# Patient Record
Sex: Male | Born: 1950 | Race: White | Hispanic: No | Marital: Married | Smoking: Never smoker
Health system: Southern US, Community
[De-identification: ages and names within clinical notes are randomized; demographics above are authoritative.]

## PROBLEM LIST (undated history)

## (undated) DIAGNOSIS — I2510 Atherosclerotic heart disease of native coronary artery without angina pectoris: Secondary | ICD-10-CM

## (undated) DIAGNOSIS — A048 Other specified bacterial intestinal infections: Secondary | ICD-10-CM

## (undated) DIAGNOSIS — F329 Major depressive disorder, single episode, unspecified: Secondary | ICD-10-CM

## (undated) DIAGNOSIS — I5032 Chronic diastolic (congestive) heart failure: Secondary | ICD-10-CM

## (undated) DIAGNOSIS — E785 Hyperlipidemia, unspecified: Secondary | ICD-10-CM

## (undated) DIAGNOSIS — J189 Pneumonia, unspecified organism: Secondary | ICD-10-CM

## (undated) DIAGNOSIS — F32A Depression, unspecified: Secondary | ICD-10-CM

## (undated) DIAGNOSIS — Q893 Situs inversus: Secondary | ICD-10-CM

## (undated) DIAGNOSIS — I10 Essential (primary) hypertension: Secondary | ICD-10-CM

## (undated) DIAGNOSIS — R000 Tachycardia, unspecified: Secondary | ICD-10-CM

## (undated) DIAGNOSIS — Q240 Dextrocardia: Secondary | ICD-10-CM

## (undated) DIAGNOSIS — I5030 Unspecified diastolic (congestive) heart failure: Secondary | ICD-10-CM

## (undated) DIAGNOSIS — J449 Chronic obstructive pulmonary disease, unspecified: Secondary | ICD-10-CM

## (undated) DIAGNOSIS — D509 Iron deficiency anemia, unspecified: Secondary | ICD-10-CM

## (undated) DIAGNOSIS — K219 Gastro-esophageal reflux disease without esophagitis: Secondary | ICD-10-CM

## (undated) DIAGNOSIS — G629 Polyneuropathy, unspecified: Secondary | ICD-10-CM

## (undated) HISTORY — DX: Hyperlipidemia, unspecified: E78.5

## (undated) HISTORY — DX: Dextrocardia: Q24.0

## (undated) HISTORY — DX: Gastro-esophageal reflux disease without esophagitis: K21.9

## (undated) HISTORY — DX: Unspecified diastolic (congestive) heart failure: I50.30

## (undated) HISTORY — DX: Major depressive disorder, single episode, unspecified: F32.9

## (undated) HISTORY — DX: Chronic diastolic (congestive) heart failure: I50.32

## (undated) HISTORY — DX: Atherosclerotic heart disease of native coronary artery without angina pectoris: I25.10

## (undated) HISTORY — DX: Iron deficiency anemia, unspecified: D50.9

## (undated) HISTORY — DX: Depression, unspecified: F32.A

## (undated) HISTORY — DX: Chronic obstructive pulmonary disease, unspecified: J44.9

## (undated) HISTORY — PX: NECK SURGERY: SHX720

## (undated) HISTORY — DX: Other specified bacterial intestinal infections: A04.8

## (undated) HISTORY — DX: Essential (primary) hypertension: I10

---

## 2001-06-04 ENCOUNTER — Ambulatory Visit (HOSPITAL_COMMUNITY): Admission: RE | Admit: 2001-06-04 | Discharge: 2001-06-04 | Payer: Self-pay | Admitting: Family Medicine

## 2001-06-04 ENCOUNTER — Encounter: Payer: Self-pay | Admitting: Family Medicine

## 2001-09-13 ENCOUNTER — Encounter: Payer: Self-pay | Admitting: Neurosurgery

## 2001-09-17 ENCOUNTER — Inpatient Hospital Stay (HOSPITAL_COMMUNITY): Admission: RE | Admit: 2001-09-17 | Discharge: 2001-09-18 | Payer: Self-pay | Admitting: Neurosurgery

## 2001-09-17 ENCOUNTER — Encounter: Payer: Self-pay | Admitting: Neurosurgery

## 2002-09-09 ENCOUNTER — Ambulatory Visit (HOSPITAL_COMMUNITY): Admission: RE | Admit: 2002-09-09 | Discharge: 2002-09-09 | Payer: Self-pay | Admitting: Pulmonary Disease

## 2010-05-27 ENCOUNTER — Encounter: Payer: Self-pay | Admitting: Orthopedic Surgery

## 2010-05-27 ENCOUNTER — Ambulatory Visit (HOSPITAL_COMMUNITY): Admission: RE | Admit: 2010-05-27 | Discharge: 2010-05-27 | Payer: Self-pay | Admitting: Internal Medicine

## 2010-06-14 ENCOUNTER — Ambulatory Visit: Payer: Self-pay | Admitting: Orthopedic Surgery

## 2010-06-14 DIAGNOSIS — M25819 Other specified joint disorders, unspecified shoulder: Secondary | ICD-10-CM | POA: Insufficient documentation

## 2010-06-14 DIAGNOSIS — M7530 Calcific tendinitis of unspecified shoulder: Secondary | ICD-10-CM | POA: Insufficient documentation

## 2010-06-14 DIAGNOSIS — M7580 Other shoulder lesions, unspecified shoulder: Secondary | ICD-10-CM

## 2010-06-22 ENCOUNTER — Encounter: Payer: Self-pay | Admitting: Orthopedic Surgery

## 2010-06-28 ENCOUNTER — Ambulatory Visit: Payer: Self-pay | Admitting: Orthopedic Surgery

## 2010-06-28 DIAGNOSIS — M4800 Spinal stenosis, site unspecified: Secondary | ICD-10-CM

## 2010-07-22 ENCOUNTER — Telehealth: Payer: Self-pay | Admitting: Orthopedic Surgery

## 2010-08-03 ENCOUNTER — Ambulatory Visit: Payer: Self-pay | Admitting: Orthopedic Surgery

## 2010-08-13 ENCOUNTER — Encounter: Payer: Self-pay | Admitting: Orthopedic Surgery

## 2010-08-13 ENCOUNTER — Encounter (INDEPENDENT_AMBULATORY_CARE_PROVIDER_SITE_OTHER): Payer: Self-pay | Admitting: *Deleted

## 2010-08-23 ENCOUNTER — Encounter
Admission: RE | Admit: 2010-08-23 | Discharge: 2010-08-23 | Payer: Self-pay | Source: Home / Self Care | Attending: Orthopedic Surgery | Admitting: Orthopedic Surgery

## 2010-08-31 ENCOUNTER — Ambulatory Visit: Payer: Self-pay | Admitting: Orthopedic Surgery

## 2010-08-31 DIAGNOSIS — M25559 Pain in unspecified hip: Secondary | ICD-10-CM

## 2010-09-07 ENCOUNTER — Telehealth: Payer: Self-pay | Admitting: Orthopedic Surgery

## 2010-09-08 ENCOUNTER — Encounter (HOSPITAL_COMMUNITY)
Admission: RE | Admit: 2010-09-08 | Discharge: 2010-10-08 | Payer: Self-pay | Source: Home / Self Care | Attending: Orthopedic Surgery | Admitting: Orthopedic Surgery

## 2010-09-09 ENCOUNTER — Ambulatory Visit (HOSPITAL_COMMUNITY)
Admission: RE | Admit: 2010-09-09 | Discharge: 2010-09-09 | Payer: Self-pay | Source: Home / Self Care | Attending: Orthopedic Surgery | Admitting: Orthopedic Surgery

## 2010-09-16 ENCOUNTER — Ambulatory Visit
Admission: RE | Admit: 2010-09-16 | Discharge: 2010-09-16 | Payer: Self-pay | Source: Home / Self Care | Attending: Orthopedic Surgery | Admitting: Orthopedic Surgery

## 2010-10-12 NOTE — Letter (Signed)
Summary: Confirmation of mri appointment   Confirmation of mri appointment   Imported By: Jacklynn Ganong 08/19/2010 08:54:24  _____________________________________________________________________  External Attachment:    Type:   Image     Comment:   External Document

## 2010-10-12 NOTE — Assessment & Plan Note (Signed)
Summary: 1 M RE-CK SHOULDER/LEGS,NEW PROBLEM PER DR H/?XRAYS/BCBS/CAF   Visit Type:  NEW PROBLEM Referring Cesare Sumlin:  Dr. Dwana Melena Primary Kijuana Ruppel:  Dr. Dwana Melena  CC:  LEG PAIN.  History of Present Illness: I saw Brett [name] in the office today for an initial visit.  He is a 60 years old man with the complaint of:  LEG PAIN.  The patient has had several months, approximately 8 months of bilateral leg pain, and a feeling that his legs were heavy. The pain is described as dull intermittent and worse with walking. He rates his pain 7/10. He was in for her shoulder and we gave him some Neurontin 100 mg at night, which seem to give him a little bit of relief.  He denies having frank numbness, tingling, bowel or bladder discomfort, although he has some hemorrhoid problems. 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  Family History: Reviewed history from 06/14/2010 and no changes required. Family History of Diabetes Family History Coronary Heart Disease male < 47 Family History of Arthritis Hx, family, asthma Hx, family, kidney disease NEC  Social History: Reviewed history from 06/14/2010 and no changes required. 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  Review of Systems Constitutional:  Denies weight loss, weight gain, fever, chills, and  fatigue. Cardiovascular:  Denies chest pain, palpitations, fainting, and murmurs. Respiratory:  Denies short of breath, wheezing, couch, tightness, pain on inspiration, and snoring . Gastrointestinal:  Denies heartburn, nausea, vomiting, diarrhea, constipation, and blood in your stools. Genitourinary:  Denies frequency, urgency, difficulty urinating, painful urination, flank pain, and bleeding in urine. Neurologic:  Denies numbness, tingling, unsteady gait, dizziness, tremors, and seizure. Musculoskeletal:  Denies joint pain, swelling, instability, stiffness, redness, heat, and muscle pain. Endocrine:  Denies excessive thirst, exessive urination, and heat or cold intolerance. Psychiatric:  Denies nervousness, depression, anxiety, and hallucinations. Skin:  Denies changes in the skin, poor healing, rash, itching, and redness. HEENT:  Denies blurred or double vision, eye pain, redness, and watering. Immunology:  Denies seasonal allergies, sinus problems, and allergic to bee stings. Hemoatologic:  Denies easy bleeding and brusing.  Physical Exam  Additional Exam:   *GEN: appearance was normal slightly overweight.  ** CDV: normal pulses temperature and no edema  * LYMPH nodes were normal   * SKIN was normal   * Neuro: normal sensation ** Psyche: AAO x 3 and mood was normal   MSK  Lumbar spine was tender primarily in the midline in the lower segments. He had positive straight leg raise at 60 on the LEFT 30 on the RIGHT with positive Lasegue's signs. Negative contralateral leg raise.  Range of motion normal in both lower extremities.  Motor exam. Grade 5 strength bilaterally.  both hips and knees were stable.  Ambulation was normal, although he seemed to lean backwards.    Impression & Recommendations:  Problem # 1:  SPINAL STENOSIS (ICD-724.00)  AP lateral, and  spot films of lumbosacral spine show that he had some coronal plane malalignment with normal lordosis of the  lumbar spine, but increased degenerative changes in the facet joints at L5-S1 and L4, L5.  Impression degenerative disc disease lumbosacral spine.  His clinical exam and history indicates most likely a spinal stenosis with minimal exam findings terms of motor strength loss or sensory loss, but positive straight leg raise is a feeling of heaviness in his legs, which appears to be worsening over 8 months.  He did get some improvement from Neurontin, but minimal improvement from pain medication  Orders: Est. Patient Level IV (88416) Lumbosacral Spine ,2/3 views (72100)  Problem # 2:  TENDINITIS, CALCIFIC, SHOULDER, RIGHT (ICD-726.11) Assessment: Improved  Orders: Est. Patient Level IV (60630)  Medications Added to Medication List This Visit: 1)  Neurontin 100 Mg Caps (Gabapentin) .... Take 1 three times a day  Patient Instructions: 1)  INCREASE THE MEDICATION TO three times a day 2)  MRI BACK  3)  RETURN WITH STUDIES  Prescriptions: NEURONTIN 100 MG CAPS (GABAPENTIN) take 1 three times a day  #60 x 2   Entered and Authorized by:   Fuller Canada MD   Signed by:   Fuller Canada MD on 08/03/2010   Method used:   Faxed to ...       [facility]* (retail)       [address]       [place], [place]  [address]       Ph: [phone]       Fax: [phone]   RxID:   [id number]    Orders Added: 1)  Est. Patient Level IV [10626] 2)  Lumbosacral Spine ,2/3 views [72100]

## 2010-10-12 NOTE — Letter (Signed)
Summary: History form  History form   Imported By: Jacklynn Ganong 06/22/2010 16:30:56  _____________________________________________________________________  External Attachment:    Type:   Image     Comment:   External Document

## 2010-10-12 NOTE — Progress Notes (Signed)
Summary: medicine not helping much  Phone Note Call from Patient   Summary of Call: Brett [name] (07/05/2051) wanted you to know that Gabapentin and Hydrocodone are not  helping his pain very much at all. Has an appointment 08/03/10, Uses [facility] His # [phone] Initial call taken by: Jacklynn Ganong,  July 22, 2010 12:03 PM

## 2010-10-12 NOTE — Letter (Signed)
Summary: Referral notes from Dr. Hughie Closs  Referral notes from Dr. Hughie Closs   Imported By: Jacklynn Ganong 06/22/2010 16:32:08  _____________________________________________________________________  External Attachment:    Type:   Image     Comment:   External Document

## 2010-10-12 NOTE — Miscellaneous (Signed)
Summary: MRI APPT [facility] 08/23/10 at 8am  Clinical Lists Changes   pt to have mri [facility]  [address] in [place], open unit, on 08/24/11 on a Monday arrive at 8am, please get cd of MRI from [facility] and bring cd to Dr. Diamantina Providence office on 08/31/10 at 9:30am to review results, precet number for MRI [id number] expires 09/11/10, faxed pt this info per request.

## 2010-10-12 NOTE — Assessment & Plan Note (Signed)
Summary: rt shoulder pain xr at [facility] 05/27/10/[facility]/bsf   Vital Signs:  Patient profile:   60 year old male Height:      68 inches Weight:      230 pounds Pulse rate:   78 / minute Resp:     16 per minute  Visit Type:  Initial Consult Referring Liliana Brentlinger:  Dr. Dwana Melena Primary Naomi Castrogiovanni:  Dr. Dwana Melena  CC:  right shoulder pain.  History of Present Illness: 60 year old male presents with a sharp throbbing moderate to severe RIGHT shoulder pain which is intermittent but associated with some numbness and pain running down into his RIGHT and LEFT upper extremity.  In 2001 he had a neck fusion anteriorly but has not seen the physician since then.  He has noted bilateral carpal tunnel syndrome with weakness of both upper extremities.  He also has some neck stiffness neck pain and headaches.  But he does have pain with forward elevation of the RIGHT shoulder and is here today because of increasing pain and pain when he rests at night when he is lying down on the RIGHT shoulder.  Xrays of the right shoulder and neck taken [facility] 05/27/10 for review.  Meds: Losartan, Zolpidem, Diclofenac 75mg , Crestor, Actos, Metformin, Levemir Flex pen, Tripilix, Vicodin 5, Cymbalta.    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  Review of Systems Constitutional:  Complains of weight gain; denies weight loss, fever, chills, and fatigue. Cardiovascular:  Denies chest pain, palpitations, fainting, and murmurs. Respiratory:  Complains of short of breath, wheezing, and couch; denies tightness,  pain on inspiration, and snoring . Gastrointestinal:  Complains of heartburn; denies nausea, vomiting, diarrhea, constipation, and blood in your stools. Genitourinary:  Denies frequency, urgency, difficulty urinating, painful urination, flank pain, and bleeding in urine. Neurologic:  Complains of numbness; denies tingling, unsteady gait, dizziness, tremors, and seizure. Musculoskeletal:  Complains of joint pain and stiffness; denies swelling, instability, redness, heat, and muscle pain. Endocrine:  Denies excessive thirst, exessive urination, and heat or cold intolerance. Psychiatric:  Denies nervousness, depression, anxiety, and hallucinations. Skin:  Denies changes in the skin, poor healing, rash, itching, and redness. HEENT:  Denies blurred or double vision, eye pain, redness, and watering. Immunology:  Denies seasonal allergies, sinus problems, and allergic to bee stings. Hemoatologic:  Denies easy bleeding and brusing.  Physical Exam  Additional Exam:  GEN: well developed, well nourished, normal grooming and hygiene, no deformity and normal body habitus.  Vital signs as recorded stable CDV: pulses are normal, no edema, no erythema. no tenderness  Lymph: normal lymph nodes   Skin: no rashes, skin lesions or open sores   NEURO: normal coordination, reflexes, decreased sensation in median nerve distribution bilaterally  Psyche: awake, alert and oriented. Mood normal   Gait: normal  RIGHT upper extremity there is no tenderness over the a.c. joint there is some tenderness in the subacromial space he has painful forward elevation abduction and external rotation although his shoulder remains stable.  Does have weakness in his rotator cuff.  He has decreased range of motion actively with forward elevation and internal rotation  His shoulder feels  stable.  He has some neck tenderness and neck stiffness.     Impression & Recommendations:  Problem # 1:  TENDINITIS, CALCIFIC,  SHOULDER, RIGHT (ICD-726.11) Assessment New  the hospital films show calcific deposits in the rotator cuff with sclerosis of the greater tuberosity and the glenohumeral joint appears to be relatively normal.  Impression calcific tendinitis rotator cuff  Treatment RIGHT shoulder subacromial injection Verbal consent obtained/The shoulder was injected with depomedrol 40mg /cc and sensorcaine .25% . There were no complications  Orders: New Patient Level III ([address]) Joint Aspirate / Injection, Large (20610) Depo- Medrol 40mg  (J1030)  Problem # 2:  IMPINGEMENT SYNDROME (ICD-726.2) Assessment: New  Orders: New Patient Level III (60454) Joint Aspirate / Injection, Large (20610) Depo- Medrol 40mg  (J1030)  Medications Added to Medication List This Visit: 1)  Vicodin 5-500 Mg Tabs (Hydrocodone-acetaminophen) .Marland Kitchen.. 1 by mouth q 4 as needed 2)  Diclofenac Sodium 50 Mg Tbec (Diclofenac sodium) .Marland Kitchen.. 1 by mouth q 12 hrs  Patient Instructions: 1)  You have received an injection of cortisone today. You may experience increased pain at the injection site. Apply ice pack to the area for 20 minutes every 2 hours and take 2 xtra strength tylenol every 8 hours. This increased pain will usually resolve in 24 hours. The injection will take effect in 3-10 days.   2)  Return in 2 weeks  3)  Continue vicodin and diclofenac  Prescriptions: DICLOFENAC SODIUM 50 MG TBEC (DICLOFENAC SODIUM) 1 by mouth q 12 hrs  #60 x 1   Entered and Authorized by:   Fuller Canada MD   Signed by:   Fuller Canada MD on 06/14/2010   Method used:   Print then Give to Patient   RxID:   [id number] VICODIN 5-500 MG TABS (HYDROCODONE-ACETAMINOPHEN) 1 by mouth q 4 as needed  #60 x 1   Entered and Authorized by:   Fuller Canada MD   Signed by:   Fuller Canada MD on 06/14/2010   Method used:   Print then Give to Patient   RxID:   [id number]

## 2010-10-12 NOTE — Letter (Signed)
Summary: History form  History form   Imported By: Jacklynn Ganong 08/04/2010 07:50:39  _____________________________________________________________________  External Attachment:    Type:   Image     Comment:   External Document

## 2010-10-12 NOTE — Assessment & Plan Note (Signed)
Summary: 2 WK RECK SHOULDER/BCBS/BSF   Visit Type:  Follow-up Referring Provider:  Dr. Dwana Melena Primary Provider:  Dr. Dwana Melena  CC:  right shoulder pain legs heavy .  History of Present Illness: followup visit [id number] shoulder impingement syndrome  Treatment:subacromial injection, anti-inflammatory, Vicodin.    Complaints:he says his shoulder is 90% better, but he has bilateral proximal leg heaviness and soreness. This started since I've seen him about 2 weeks ago. Today, for recheck.  Review of systems as stated.     Allergies: No Known Drug Allergies  Physical Exam  Additional Exam:  he appears to be limping.  He has no tenderness in his proximal thighs. He has full range of motion in his hips. In terms of flexion and no pain with internal rotation. He has negative straight leg raises bilaterally.  Reflexes are normal.  He has some mild lower back tenderness.   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 Patient Level III (84696)  Problem # 2:  SPINAL STENOSIS (ICD-724.00) Assessment: New  possible early spinal stenosis.  Recommendation the pin 100 mg q.h.s. x1 month in followup for reevaluation.  There does not appear to be acute symptoms of cauda equina  Orders: Est. Patient Level III (29528)  Medications Added to Medication List This Visit: 1)  Neurontin 100 Mg Caps (Gabapentin) .Marland Kitchen.. 1 by mouth at bedtime  Patient Instructions: 1)  Please schedule a follow-up appointment in 1 month. Prescriptions: NEURONTIN 100 MG CAPS (GABAPENTIN) 1 by mouth at bedtime  #30 x 0   Entered and Authorized by:   Fuller Canada MD   Signed by:   Fuller Canada MD on 06/28/2010   Method used:   Print then Give to Patient   RxID:   [id number]    Orders Added: 1)  Est. Patient Level III [44034]

## 2010-10-13 ENCOUNTER — Ambulatory Visit (HOSPITAL_COMMUNITY): Payer: Self-pay

## 2010-10-13 ENCOUNTER — Telehealth: Payer: Self-pay | Admitting: Orthopedic Surgery

## 2010-10-14 NOTE — Letter (Signed)
Summary: MRI approval [employer]  MRI approval [employer]   Imported By: Cammie Sickle 08/24/2010 09:42:10  _____________________________________________________________________  External Attachment:    Type:   Image     Comment:   External Document

## 2010-10-14 NOTE — Assessment & Plan Note (Signed)
Summary: mri results to bring disc from [facility]   Visit Type:  Follow-up Referring Provider:  Dr. Dwana Melena Primary Provider:  Dr. Dwana Melena  CC:  mri results L spine.  History of Present Illness: History: The patient has had several months, approximately 8 months of bilateral leg pain, and a feeling that his legs were heavy. The pain is described as dull intermittent and worse with walking. He rates his pain 7/10. He was in for her shoulder and we gave him some Neurontin 100 mg at night, which seem to give him a little bit of relief.  DX: preliminary presumptive diagnosis is degenerative disc disease with spinal stenosis  Image: MRI lumbar spine:  IMPRESSION:   1.  Mild degenerative changes of the facet joints in the lower lumbar spine. 2.  Tiny central subligamentous disc protrusion at L5 S1 with no impingement.  Treatment Neurontin 100 mg t.i.d. and Vicodin 5 mg q.4 p.r.n. pain  ROS has pain in neck and shoulders also, was seen for this in October, had injection in the right shoulder, helped.  S/P also status post cervical fusion anteriorly by Dr. Reita Cliche early in 2000's  His complaints are confusing, he has pain when he walks he has stiffness when he bends over but his MRI does not show spinal stenosis.  I think he should be checked for peripheral vascular disease and if that proves normal then I would look at his cervical spine again for possible central cord type lesion which may explain his leg symptoms of  heaviness.     Allergies: No Known Drug Allergies   Impression & Recommendations:  Problem # 1:  SPINAL STENOSIS (ICD-724.00) ruled out for spinal stenosis MRI does not show that. Orders: Physical Therapy Referral (PT) Est. Patient Level III (40347)  Problem # 2:  PERIPHERAL VASCULAR DISEASE (ICD-443.9) Assessment: New  Orders: Est. Patient Level III (42595)  Problem # 3:  HIP PAIN (ICD-719.45) Assessment: New  pelvic x-ray bilateral  frog laterals  Normal hip joints   His updated medication list for this problem includes:    Vicodin 5-500 Mg Tabs (Hydrocodone-acetaminophen) .Marland Kitchen... 1 by mouth q 4 as needed    Diclofenac Sodium 50 Mg Tbec (Diclofenac sodium) .Marland Kitchen... 1 by mouth q 12 hrs  Orders: Est. Patient Level III (63875) He seems to have potential for peripheral vascular disease based on the symptoms so we will get an arterial brachial index.  For his neck and shoulder I would like to try a steroid Dosepak  Medications Added to Medication List This Visit: 1)  Prednisone (pak) 10 Mg Tabs (Prednisone) .... As directed for 12 days  Complete Medication List: 1)  Vicodin 5-500 Mg Tabs (Hydrocodone-acetaminophen) .Marland Kitchen.. 1 by mouth q 4 as needed 2)  Diclofenac Sodium 50 Mg Tbec (Diclofenac sodium) .Marland Kitchen.. 1 by mouth q 12 hrs 3)  Neurontin 100 Mg Caps (Gabapentin) .... Take 1 three times a day 4)  Prednisone (pak) 10 Mg Tabs (Prednisone) .... As directed for 12 days  Patient Instructions: 1)  This patient will need a workup for his arterial tree to rule out peripheral vascular disease as the cause for his bilateral leg pain as his lumbar spine MRI did not show a lot of disease. 2)  Recommend arterial brachial indices. Prescriptions: PREDNISONE (PAK) 10 MG TABS (PREDNISONE) as directed for 12 days  #1 x 0   Entered and Authorized by:   Fuller Canada MD   Signed by:   Fuller Canada MD  on 08/31/2010   Method used:   Faxed to ...       [facility]* (retail)       [address]       [place], [place]  [address]       Ph: [phone]       Fax: [phone]   RxID:   [id number]    Orders Added: 1)  Physical Therapy Referral [PT] 2)  Est. Patient Level III [84132]

## 2010-10-14 NOTE — Progress Notes (Signed)
Summary: ABI appointment.  Phone Note Outgoing Call   Call placed by: Waldon Reining,  September 07, 2010 4:18 PM Call placed to: Patient Action Taken: Appt scheduled Summary of Call: I called to give the patient his appointment for ABI at [facility] on 09-09-10. Patient will follow up back here for his results.

## 2010-10-14 NOTE — Assessment & Plan Note (Signed)
Summary: ABI results from [facility]   Referring Provider:  Dr. Dwana Melena Primary Provider:  Dr. Dwana Melena   History of Present Illness:  UV:OZDG pain, arthritis, multiple joint arthritis.  Hip pain.  Treatment:physical therapy and medications Vicodin, diclofenac, Neurontin, steroid Dosepak  MEDS: as stated  Complaints:he says he is much better after the dose pack.  He continues with the Vicodin and diclofenac  Today, scheduled [id number] and discussion of arterial studies   arterial studies show normal ABIs  Complete Medication List: 1)  Vicodin 5-500 Mg Tabs (Hydrocodone-acetaminophen) .Marland Kitchen.. 1 by mouth q 4 as needed 2)  Diclofenac Sodium 50 Mg Tbec (Diclofenac sodium) .Marland Kitchen.. 1 by mouth q 12 hrs  Allergies: No Known Drug Allergies   Impression & Recommendations:  Problem # 1:  HIP PAIN (ICD-719.45) Assessment Comment Only  improved and stable His updated medication list for this problem includes:    Vicodin 5-500 Mg Tabs (Hydrocodone-acetaminophen) .Marland Kitchen... 1 by mouth q 4 as needed    Diclofenac Sodium 50 Mg Tbec (Diclofenac sodium) .Marland Kitchen... 1 by mouth q 12 hrs  Orders: Est. Patient Level II (75643)  Problem # 2:  PERIPHERAL VASCULAR DISEASE (ICD-443.9) Assessment: Comment Only  ruled out for peripheral vascular disease with normal ABI  Orders: Est. Patient Level II (32951)  Problem # 3:  SPINAL STENOSIS (ICD-724.00) Assessment: Comment Only  ruled out for spinal stenosis but does have some degenerative disc disease  Orders: Est. 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.2) Assessment: Comment Only improved impingement syndrome  Patient Instructions: 1)  finish therapy  2)  stay on the medications  3)  return in 3 months  Prescriptions: DICLOFENAC SODIUM 50 MG TBEC (DICLOFENAC SODIUM) 1 by mouth q 12 hrs  #60 x 5   Entered and Authorized by:   Fuller Canada MD   Signed by:   Fuller Canada MD on 09/16/2010   Method  used:   Print then Give to Patient   RxID:   [id number] VICODIN 5-500 MG TABS (HYDROCODONE-ACETAMINOPHEN) 1 by mouth q 4 as needed  #60 x 5   Entered and Authorized by:   Fuller Canada MD   Signed by:   Fuller Canada MD on 09/16/2010   Method used:   Print then Give to Patient   RxID:   [id number]    Orders Added: 1)  Est. Patient Level II [28315]

## 2010-10-15 ENCOUNTER — Ambulatory Visit (HOSPITAL_COMMUNITY): Payer: Self-pay

## 2010-10-20 NOTE — Progress Notes (Signed)
Summary: call from patient, still having neck and shoulder pain  Phone Note Call from Patient   Caller: Patient Summary of Call: Patient called to say - wanted to let Dr Romeo Apple know that he still is having problems with neck and both shoulders.  Said medication is helping some "but still hurting."  His next scheduled appt is 11/17/10. Pharmacy is [facility].  Please advise patient if any recommendation Initial call taken by: Cammie Sickle,  October 13, 2010 9:53 AM  Follow-up for Phone Call        continue current treatment   vicodin and neurontin  Follow-up by: Fuller Canada MD,  October 13, 2010 9:59 AM  Additional Follow-up for Phone Call Additional follow up Details #1::        called patient back to advise - left message on answer machine to return call.  * patient returned call 10:35am; advised. Additional Follow-up by: Cammie Sickle,  October 13, 2010 10:18 AM

## 2010-11-03 ENCOUNTER — Ambulatory Visit (HOSPITAL_COMMUNITY)
Admission: RE | Admit: 2010-11-03 | Discharge: 2010-11-03 | Disposition: A | Discharge status: Home / Self Care | Payer: BC Managed Care – PPO | Source: Ambulatory Visit | Attending: Family Medicine | Admitting: Family Medicine

## 2010-11-03 ENCOUNTER — Other Ambulatory Visit (HOSPITAL_COMMUNITY): Payer: Self-pay | Admitting: Family Medicine

## 2010-11-03 DIAGNOSIS — M542 Cervicalgia: Secondary | ICD-10-CM

## 2010-11-03 DIAGNOSIS — M25519 Pain in unspecified shoulder: Secondary | ICD-10-CM | POA: Insufficient documentation

## 2010-11-03 DIAGNOSIS — M47812 Spondylosis without myelopathy or radiculopathy, cervical region: Secondary | ICD-10-CM | POA: Insufficient documentation

## 2010-11-03 DIAGNOSIS — Z981 Arthrodesis status: Secondary | ICD-10-CM | POA: Insufficient documentation

## 2010-11-09 ENCOUNTER — Encounter: Payer: Self-pay | Admitting: Orthopedic Surgery

## 2010-11-17 ENCOUNTER — Ambulatory Visit: Payer: Self-pay | Admitting: Orthopedic Surgery

## 2010-11-18 NOTE — Miscellaneous (Signed)
Summary: Rehab Report  Rehab Report   Imported By: Cammie Sickle 11/09/2010 11:39:36  _____________________________________________________________________  External Attachment:    Type:   Image     Comment:   External Document

## 2010-12-27 ENCOUNTER — Other Ambulatory Visit: Payer: Self-pay | Admitting: Neurosurgery

## 2010-12-27 DIAGNOSIS — M479 Spondylosis, unspecified: Secondary | ICD-10-CM

## 2011-01-03 ENCOUNTER — Ambulatory Visit
Admission: RE | Admit: 2011-01-03 | Discharge: 2011-01-03 | Disposition: A | Discharge status: Home / Self Care | Payer: BC Managed Care – PPO | Source: Ambulatory Visit | Attending: Neurosurgery | Admitting: Neurosurgery

## 2011-01-03 DIAGNOSIS — M479 Spondylosis, unspecified: Secondary | ICD-10-CM

## 2011-01-28 NOTE — Op Note (Signed)
[facility]  Patient:    Brett [name], Brett [name] Visit Number: [id number] MRN: [id number]          Service Type: SUR Location: [id number] Attending Physician:  Barton Fanny Dictated by:   Hewitt Shorts, [name] Proc. Date: 09/17/01 Admit Date:  09/17/2001                             Operative Report  PREOPERATIVE DIAGNOSIS:  C5-6 and C6-7 herniated disk, degenerative disk disease, spondylosis.  POSTOPERATIVE DIAGNOSIS:  C5-6 and C6-7 herniated disk, degenerative disk disease, spondylosis.  OPERATION PERFORMED:  C5-6 and C6-7 anterior cervical diskectomy and arthrodesis with iliac crest allograft and Reflex cervical plating.  SURGEON:  Hewitt Shorts, [name]  ASSISTANT:  Tanya Nones. Jeral Fruit, [name]  ANESTHESIA:  General endotracheal.  INDICATIONS FOR PROCEDURE:  The patient is a 60 year old man who presented with left cervical radiculopathy and was found to have a large spondylitic spur central to the left at C5-6 and forward based C6-7 cervical disk herniation worse to the left than to the right at C6-7.  Decision was made to proceed with a two-level anterior cervical diskectomy and arthrodesis.  DESCRIPTION OF PROCEDURE:  The patient was brought to the operating room and placed under general endotracheal anesthesia.  The patient was placed in 10 pounds of halter traction and the neck was prepped with Betadine soap and solution and draped in a sterile fashion.  A horizontal incision was made on the left side of the neck.  The line of the incision was infiltrated with local anesthetic with epinephrine.  Incision was made with a Shaw scalpel at a temperature of 120.  Dissection was carried down through subcutaneous tissues and platysma.  Dissection was then carried out through the avascular plane leaving the sternocleidomastoid, carotid artery and jugular vein laterally and the trachea and esophagus medially.  The ventral aspect of the  vertebral column was identified.  Localizing x-ray was taken and the C5-6 and C6-7 intervertebral disk spaces were identified.  A diskectomy was begun at each level with incision of the annulus and continued with microcurets and pituitary rongeurs.  The cartilaginous end plates of the corresponding vertebrae were removed using the Micromax drill and microcurets.  The microscope was draped and brought into the field to provide additional magnification, illumination and visualization.  The remainder of the procedure was performed using microdissection and microsurgical technique.  Posterior osteophytic overgrowth was removed using the Micromax drill as well as a 2 mm Kerrison punch with a thin foot plate.  At the C6-7 level we encountered a disk herniation which was carefully removed and the posterior longitudinal ligament was completely removed and the foramina were decompressed bilaterally.  At C5-6 there was significant spondylitic overgrowth posteriorly again removed with the Micromax drill as well as a 2 mm Kerrison punch with a thin foot plate.  Again foraminotomy was performed bilaterally and good decompression of the thecal sac and nerve roots was achieved bilaterally at each level.  Once the diskectomy and decompression was completed, the wound was irrigated with bacitracin solution and checked for hemostasis which was established and confirmed and then we proceeded with the arthrodesis.  We selected wedges of iliac crest allograft.  Each was cut and shaped to size and positioned in the intervertebral disk space and countersunk.  We then selected a 34 mm Reflex cervical plate.  The traction was discontinued.  We  positioned the plate over the fusion construct and secured it to each of the vertebrae with a pair of 4.0 x 14 mm screws.  Each of the screw holes was started with a drill, tapped and then the screw placed.  They were placed in an alternating fashion and all of the screws  were tightened fully.  An x-ray at the end showed the screws were in good position at C5 as was the graft at the C5-6 level and the remainder of the construct could not be seen on the x-ray due to his large shoulders; however, under direct visualization, the construct appeared good.  The wound was then irrigated with bacitracin solution and checked once again for hemostasis which was established, confirmed and then we proceeded with closure.  The platysma was closed with interrupted inverted 2-0 undyed Vicryl sutures, the subcutaneous and subcuticular layer were closed with interrupted inverted 3-0 undyed Vicryl sutures and the skin edges were approximated with Dermabond.  The patient tolerated the procedure well. Estimated blood loss for this procedure was 50 cc.  Sponge, needle and instrument counts were correct.  Following surgery, the patient was reversed from anesthetic to be extubated and transferred to the recovery room for further care. Dictated by:   Hewitt Shorts, [name] Attending Physician:  Barton Fanny DD:  09/17/01 TD:  09/17/01 Job: [id number]

## 2011-06-10 ENCOUNTER — Ambulatory Visit (HOSPITAL_COMMUNITY)
Admission: RE | Admit: 2011-06-10 | Discharge: 2011-06-10 | Disposition: A | Discharge status: Home / Self Care | Payer: BC Managed Care – PPO | Source: Ambulatory Visit | Attending: Family Medicine | Admitting: Family Medicine

## 2011-06-10 ENCOUNTER — Other Ambulatory Visit (HOSPITAL_COMMUNITY): Payer: Self-pay | Admitting: Family Medicine

## 2011-06-10 DIAGNOSIS — R05 Cough: Secondary | ICD-10-CM

## 2011-06-10 DIAGNOSIS — R059 Cough, unspecified: Secondary | ICD-10-CM | POA: Insufficient documentation

## 2011-08-09 ENCOUNTER — Other Ambulatory Visit: Payer: Self-pay | Admitting: Neurology

## 2011-08-09 DIAGNOSIS — R29898 Other symptoms and signs involving the musculoskeletal system: Secondary | ICD-10-CM

## 2011-08-16 ENCOUNTER — Ambulatory Visit (HOSPITAL_COMMUNITY)
Admission: RE | Admit: 2011-08-16 | Discharge: 2011-08-16 | Disposition: A | Discharge status: Home / Self Care | Payer: BC Managed Care – PPO | Source: Ambulatory Visit | Attending: Neurology | Admitting: Neurology

## 2011-08-16 DIAGNOSIS — R269 Unspecified abnormalities of gait and mobility: Secondary | ICD-10-CM | POA: Insufficient documentation

## 2011-08-16 DIAGNOSIS — R29898 Other symptoms and signs involving the musculoskeletal system: Secondary | ICD-10-CM

## 2011-08-16 DIAGNOSIS — M6281 Muscle weakness (generalized): Secondary | ICD-10-CM | POA: Insufficient documentation

## 2012-10-10 ENCOUNTER — Ambulatory Visit (HOSPITAL_COMMUNITY)
Admission: RE | Admit: 2012-10-10 | Discharge: 2012-10-10 | Disposition: A | Discharge status: Home / Self Care | Payer: Managed Care, Other (non HMO) | Source: Ambulatory Visit | Attending: Family Medicine | Admitting: Family Medicine

## 2012-10-10 ENCOUNTER — Other Ambulatory Visit (HOSPITAL_COMMUNITY): Payer: Self-pay | Admitting: Family Medicine

## 2012-10-10 DIAGNOSIS — R059 Cough, unspecified: Secondary | ICD-10-CM | POA: Insufficient documentation

## 2012-10-10 DIAGNOSIS — R05 Cough: Secondary | ICD-10-CM

## 2012-10-15 ENCOUNTER — Emergency Department (HOSPITAL_COMMUNITY)
Admission: EM | Admit: 2012-10-15 | Discharge: 2012-10-15 | Disposition: A | Discharge status: Home / Self Care | Payer: Managed Care, Other (non HMO) | Attending: Emergency Medicine | Admitting: Emergency Medicine

## 2012-10-15 ENCOUNTER — Emergency Department (HOSPITAL_COMMUNITY): Payer: Managed Care, Other (non HMO)

## 2012-10-15 ENCOUNTER — Encounter (HOSPITAL_COMMUNITY): Payer: Self-pay | Admitting: *Deleted

## 2012-10-15 DIAGNOSIS — Q248 Other specified congenital malformations of heart: Secondary | ICD-10-CM | POA: Insufficient documentation

## 2012-10-15 DIAGNOSIS — I10 Essential (primary) hypertension: Secondary | ICD-10-CM | POA: Insufficient documentation

## 2012-10-15 DIAGNOSIS — E119 Type 2 diabetes mellitus without complications: Secondary | ICD-10-CM | POA: Insufficient documentation

## 2012-10-15 DIAGNOSIS — M549 Dorsalgia, unspecified: Secondary | ICD-10-CM | POA: Insufficient documentation

## 2012-10-15 DIAGNOSIS — J9801 Acute bronchospasm: Secondary | ICD-10-CM

## 2012-10-15 DIAGNOSIS — J40 Bronchitis, not specified as acute or chronic: Secondary | ICD-10-CM | POA: Insufficient documentation

## 2012-10-15 DIAGNOSIS — Z79899 Other long term (current) drug therapy: Secondary | ICD-10-CM | POA: Insufficient documentation

## 2012-10-15 DIAGNOSIS — R0602 Shortness of breath: Secondary | ICD-10-CM | POA: Insufficient documentation

## 2012-10-15 DIAGNOSIS — Z794 Long term (current) use of insulin: Secondary | ICD-10-CM | POA: Insufficient documentation

## 2012-10-15 LAB — BASIC METABOLIC PANEL
BUN: 9 mg/dL (ref 6–23)
CO2: 29 mEq/L (ref 19–32)
Chloride: 96 mEq/L (ref 96–112)
Creatinine, Ser: 0.81 mg/dL (ref 0.50–1.35)

## 2012-10-15 LAB — CBC WITH DIFFERENTIAL/PLATELET
HCT: 31.8 % — ABNORMAL LOW (ref 39.0–52.0)
Hemoglobin: 10.3 g/dL — ABNORMAL LOW (ref 13.0–17.0)
Lymphocytes Relative: 20 % (ref 12–46)
Monocytes Absolute: 1.2 10*3/uL — ABNORMAL HIGH (ref 0.1–1.0)
Monocytes Relative: 9 % (ref 3–12)
Neutro Abs: 9.8 10*3/uL — ABNORMAL HIGH (ref 1.7–7.7)
WBC: 13.9 10*3/uL — ABNORMAL HIGH (ref 4.0–10.5)

## 2012-10-15 LAB — TROPONIN I: Troponin I: <0.3 ng/mL (ref <0.30)

## 2012-10-15 LAB — D-DIMER, QUANTITATIVE: D-Dimer, Quant: 0.27 ug/mL-FEU (ref 0.00–0.48)

## 2012-10-15 MED ORDER — HYDROCODONE-ACETAMINOPHEN 5-325 MG PO TABS: 1.0000 | ORAL_TABLET | Freq: Four times a day (QID) | ORAL | Status: DC | PRN

## 2012-10-15 MED ORDER — IPRATROPIUM BROMIDE 0.02 % IN SOLN
0.5000 mg | Freq: Once | RESPIRATORY_TRACT | Status: AC
  Administered 2012-10-15: 0.5 mg via RESPIRATORY_TRACT
  Filled 2012-10-15: qty 2.5

## 2012-10-15 MED ORDER — PREDNISONE 50 MG PO TABS
60.0000 mg | ORAL_TABLET | Freq: Once | ORAL | Status: AC
  Administered 2012-10-15: 60 mg via ORAL
  Filled 2012-10-15: qty 1

## 2012-10-15 MED ORDER — ALBUTEROL SULFATE (5 MG/ML) 0.5% IN NEBU
5.0000 mg | INHALATION_SOLUTION | Freq: Once | RESPIRATORY_TRACT | Status: AC
  Administered 2012-10-15: 5 mg via RESPIRATORY_TRACT
  Filled 2012-10-15: qty 1

## 2012-10-15 NOTE — ED Notes (Signed)
Patient with no complaints at this time. Respirations even and unlabored. Skin warm/dry. Discharge instructions reviewed with patient at this time. Patient given opportunity to voice concerns/ask questions. IV removed per policy and band-aid applied to site. Patient discharged at this time and left Emergency Department with steady gait.  

## 2012-10-15 NOTE — ED Notes (Signed)
Awakened with chest pain, sob and pain in back,  Recent flu.  Cough for 2-3 weeks,

## 2012-10-15 NOTE — ED Notes (Signed)
Respiratory called for breathing treatment.

## 2012-10-15 NOTE — ED Provider Notes (Signed)
History     CSN: [id number]  Arrival date & time 10/15/12  1321   First MD Initiated Contact with Patient 10/15/12 1421      Chief Complaint  Patient presents with  . Chest Pain    (Consider location/radiation/quality/duration/timing/severity/associated sxs/prior treatment) Patient is a 62 y.o. male presenting with chest pain. The history is provided by the patient (the pt complains of some chest tightness).  Chest Pain The chest pain began 12 - 24 hours ago. Chest pain occurs frequently. The chest pain is unchanged. The pain is associated with breathing. At its most intense, the pain is at 3/10. The pain is currently at 2/10. The severity of the pain is moderate. The quality of the pain is described as aching. The pain does not radiate. Chest pain is worsened by deep breathing. Pertinent negatives for primary symptoms include no fever, no fatigue, no cough and no abdominal pain.  Pertinent negatives for past medical history include no seizures.     Past Medical History  Diagnosis Date  . Diabetes mellitus   . Depression   . HTN (hypertension)     Cholesterol    History reviewed. No pertinent past surgical history.  Family History  Problem Relation Age of Onset  . Diabetes      Fx  . Heart defect      Fx  . Arthritis      Fx  . Asthma      Fx  . Kidney disease      Fx    History  Substance Use Topics  . Smoking status: Never Smoker   . Smokeless tobacco: Current User    Types: Chew  . Alcohol Use: No      Review of Systems  Constitutional: Negative for fever and fatigue.  HENT: Negative for congestion, sinus pressure and ear discharge.   Eyes: Negative for discharge.  Respiratory: Negative for cough.   Cardiovascular: Positive for chest pain.  Gastrointestinal: Negative for abdominal pain and diarrhea.  Genitourinary: Negative for frequency and hematuria.  Musculoskeletal: Negative for back pain.  Skin: Negative for rash.  Neurological: Negative for  seizures and headaches.  Hematological: Negative.   Psychiatric/Behavioral: Negative for hallucinations.    Allergies  Review of patient's allergies indicates no known allergies.  Home Medications   Current Outpatient Rx  Name  Route  Sig  Dispense  Refill  . ALBUTEROL SULFATE HFA 108 (90 BASE) MCG/ACT IN AERS   Inhalation   Inhale 2 puffs into the lungs every 4 (four) hours as needed. For shortness of breath         . CHOLINE FENOFIBRATE 135 MG PO CPDR   Oral   Take 135 mg by mouth daily.         . DULOXETINE HCL 60 MG PO CPEP   Oral   Take 60 mg by mouth daily.         Marland Kitchen FOLIC ACID 1 MG PO TABS   Oral   Take 1 mg by mouth daily.         Marland Kitchen GABAPENTIN 100 MG PO CAPS   Oral   Take 100 mg by mouth 3 (three) times daily. Take 1  Three times a day          . GUAIFENESIN ER 600 MG PO TB12   Oral   Take 1,200 mg by mouth 2 (two) times daily.         Marland Kitchen HYDROCODONE-HOMATROPINE 5-1.5 MG/5ML PO SYRP  Oral   Take 5 mLs by mouth every 4 (four) hours as needed. For cough         . INSULIN DETEMIR 100 UNIT/ML [place] SOLN   Subcutaneous   Inject 36 Units into the skin at bedtime.         Marland Kitchen LEVOFLOXACIN 500 MG PO TABS   Oral   Take 500 mg by mouth daily. For 7 days. **started on 10/10/12**         . LORAZEPAM 1 MG PO TABS   Oral   Take 1 mg by mouth every 4 (four) hours as needed. For nerves         . METFORMIN HCL 1000 MG PO TABS   Oral   Take 1,000 mg by mouth 2 (two) times daily with a meal.         . PIOGLITAZONE HCL 45 MG PO TABS   Oral   Take 45 mg by mouth daily.         Marland Kitchen ROSUVASTATIN CALCIUM 20 MG PO TABS   Oral   Take 20 mg by mouth daily.         . TRAMADOL HCL 50 MG PO TABS   Oral   Take 50-100 mg by mouth every 6 (six) hours as needed. For pain           BP 135/57  Pulse 88  Temp 97.6 F (36.4 C) (Oral)  Resp 19  Ht 5\' 7"  (1.702 m)  Wt 260 lb (117.935 kg)  BMI 40.72 kg/m2  SpO2 94%  Physical Exam  Constitutional: He  is oriented to person, place, and time. He appears well-developed.  HENT:  Head: Normocephalic and atraumatic.  Eyes: Conjunctivae normal and EOM are normal. No scleral icterus.  Neck: Neck supple. No thyromegaly present.  Cardiovascular: Normal rate and regular rhythm.  Exam reveals no gallop and no friction rub.   No murmur heard. Pulmonary/Chest: No stridor. He has no wheezes. He has no rales. He exhibits no tenderness.  Abdominal: He exhibits no distension. There is no tenderness. There is no rebound.  Musculoskeletal: Normal range of motion. He exhibits no edema.  Lymphadenopathy:    He has no cervical adenopathy.  Neurological: He is oriented to person, place, and time. Coordination normal.  Skin: No rash noted. No erythema.  Psychiatric: He has a normal mood and affect. His behavior is normal.    ED Course  Procedures (including critical care time)  Labs Reviewed  CBC WITH DIFFERENTIAL - Abnormal; Notable for the following:    WBC 13.9 (*)     RBC 3.46 (*)     Hemoglobin 10.3 (*)     HCT 31.8 (*)     Platelets 614 (*)     Neutro Abs 9.8 (*)     Monocytes Absolute 1.2 (*)     All other components within normal limits  BASIC METABOLIC PANEL - Abnormal; Notable for the following:    Glucose, Bld 213 (*)     All other components within normal limits  TROPONIN I  D-DIMER, QUANTITATIVE   Dg Chest Portable 1 View  10/15/2012  *RADIOLOGY REPORT*  Clinical Data: Chest tightness and cold symptoms.  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 Lungs are clear with slightly low lung volumes.  Postsurgical changes in the lower cervical spine.  No focal airspace disease or pulmonary edema.  IMPRESSION: Low lung volumes without focal  disease.  Dextrocardia.   Original Report Authenticated By: Richarda Overlie, [name]      1. Bronchitis   2. Bronchospasm      Date: 10/15/2012  Rate: 98  Rhythm: normal sinus rhythm  QRS  Axis: normal  Intervals: normal  ST/T Wave abnormalities: nonspecific ST changes  Conduction Disutrbances:none  Narrative Interpretation:   Old EKG Reviewed: unchanged    MDM          Benny Lennert, MD 10/15/12 [phone]

## 2012-10-15 NOTE — ED Notes (Signed)
Patient requesting to sit in chair. Assisted patient to chair.

## 2012-10-18 ENCOUNTER — Encounter: Payer: Self-pay | Admitting: Urgent Care

## 2012-10-18 ENCOUNTER — Ambulatory Visit (INDEPENDENT_AMBULATORY_CARE_PROVIDER_SITE_OTHER): Payer: Managed Care, Other (non HMO) | Admitting: Urgent Care

## 2012-10-18 VITALS — BP 148/70 | HR 80 | Temp 98.4°F | Ht 67.0 in | Wt 238.0 lb

## 2012-10-18 DIAGNOSIS — R109 Unspecified abdominal pain: Secondary | ICD-10-CM

## 2012-10-18 DIAGNOSIS — D500 Iron deficiency anemia secondary to blood loss (chronic): Secondary | ICD-10-CM | POA: Insufficient documentation

## 2012-10-18 DIAGNOSIS — D649 Anemia, unspecified: Secondary | ICD-10-CM

## 2012-10-18 DIAGNOSIS — D509 Iron deficiency anemia, unspecified: Secondary | ICD-10-CM

## 2012-10-18 DIAGNOSIS — R110 Nausea: Secondary | ICD-10-CM

## 2012-10-18 DIAGNOSIS — R1010 Upper abdominal pain, unspecified: Secondary | ICD-10-CM

## 2012-10-18 DIAGNOSIS — K219 Gastro-esophageal reflux disease without esophagitis: Secondary | ICD-10-CM

## 2012-10-18 HISTORY — DX: Iron deficiency anemia, unspecified: D50.9

## 2012-10-18 LAB — IRON AND TIBC: TIBC: 500 ug/dL — ABNORMAL HIGH (ref 215–435)

## 2012-10-18 LAB — HEMOGLOBIN: Hemoglobin: 10.3 g/dL — ABNORMAL LOW (ref 13.0–17.0)

## 2012-10-18 MED ORDER — PEG 3350-KCL-NA BICARB-NACL 420 G PO SOLR: 4000.0000 mL | ORAL | Status: DC

## 2012-10-18 MED ORDER — OMEPRAZOLE 20 MG PO CPDR: 20.0000 mg | DELAYED_RELEASE_CAPSULE | Freq: Every day | ORAL | Status: DC

## 2012-10-18 NOTE — Assessment & Plan Note (Signed)
Chronic nausea of ? Etiology.  EGD for further evaluation.  Differentials include PUD, gastritis, poorly controlled GERD, & gastroparesis.

## 2012-10-18 NOTE — Progress Notes (Signed)
Faxed to PCP

## 2012-10-18 NOTE — Patient Instructions (Addendum)
Please get your labs as soon as possible.  We will call you with results. Please return iFOBT stool specimen to our office as soon as possible. Colonoscopy & EGD with Dr Jena Gauss Take half of your diabetes medications (GLUCOPHAGE/ACTOS) the day prior to the procedure Take half insulin (LEVIMIR 18 units) the day  Early morning appointment-hold diabetes medications day of procedure Bring all your medications and/or any insulin to the hospital the day of the procedure. Follow blood sugars, call us or your PCP if any problems.

## 2012-10-18 NOTE — Assessment & Plan Note (Addendum)
Brett [name] is a pleasant 62 y.o. male with upper abdominal pain & GERD symptoms, worse nocturnally.  He also is anemic.  He has not been on medication for GERD.  He will need EGD to r/o PUD, gastritis or complicated GERD.  I have discussed risks & benefits which include, but are not limited to, bleeding, infection, perforation & drug reaction.  The patient agrees with this plan & written consent will be obtained.    Take half of your diabetes medications (GLUCOPHAGE/ACTOS) the day prior to the procedure Take half insulin (LEVIMIR 18 units) the day  Early morning appointment-hold diabetes medications day of procedure Bring all your medications and/or any insulin to the hospital the day of the procedure. Follow blood sugars, call us or your PCP if any problems.

## 2012-10-18 NOTE — Assessment & Plan Note (Addendum)
Anemia, etiology unknown.   Hgb 10.3.  Pt symptomatic with SOB & fatigue.  Denies any gross GI bleeding.  Will check for occult bleeding & iron deficiency.  Recheck Hgb, anemia panel iFOBT Colonoscopy & EGD with Dr Jena Gauss.  I have discussed risks & benefits which include, but are not limited to, bleeding, infection, perforation & drug reaction.  The patient agrees with this plan & written consent will be obtained.   Take half of your diabetes medications (GLUCOPHAGE/ACTOS) the day prior to the procedure Take half insulin (LEVIMIR 18 units) the day  Early morning appointment-hold diabetes medications day of procedure Bring all your medications and/or any insulin to the hospital the day of the procedure. Follow blood sugars, call us or your PCP if any problems.

## 2012-10-18 NOTE — Progress Notes (Signed)
Referring Provider: Alice Reichert, MD Primary Care Physician:  Alice Reichert, MD Primary Gastroenterologist:  Dr. Jena Gauss  Chief Complaint  Patient presents with  . Abdominal Pain    HPI:  Brett [name] is a 62 y.o. male here as a referral from Dr. Renard Matter for abdominal pain, nausea & constipation.  C/o "stomach pain" occasionally.  He has had intermittent pain that lasts a couple hrs.  The pain is worse after eating.  C/o nausea but denies vomiting.  C/o soreness in his upper abdomen for the past year.  He has proctalgia & anorectal pruritis & he thinks he has hemorrhoids.  He has had small amounts of bleeding intermittently for over a year.  He has severe acid reflux & water brash that is bad at night.  He has not been on PPI recently.  Denies dysphagia or odynophagia.  Never had EGD or colonoscopy.  He reports he thinks he has fever, but did not check with a thermometer.  He has had diaphoresis & chills.  His weight has been steadily increasing.   He denies constipation, diarrhea, rectal bleeding, or melena.  He has occasional hard stools & straining.  She has been experiencing fatigue & SOB for a few weeks.  He went to the ER about 3 days ago.  Hgb was 10.3.   Recent Results (from the past 2016 hour(s))  TROPONIN I   Collection Time   10/15/12  1:37 PM      Component Value Range   Troponin I <0.30  <0.30 ng/mL  CBC WITH DIFFERENTIAL   Collection Time   10/15/12  1:37 PM      Component Value Range   WBC 13.9 (*) 4.0 - 10.5 K/uL   RBC 3.46 (*) 4.22 - 5.81 MIL/uL   Hemoglobin 10.3 (*) 13.0 - 17.0 g/dL   HCT 14.7 (*) 82.9 - 56.2 %   MCV 91.9  78.0 - 100.0 fL   MCH 29.8  26.0 - 34.0 pg   MCHC 32.4  30.0 - 36.0 g/dL   RDW 13.0  86.5 - 78.4 %   Platelets 614 (*) 150 - 400 K/uL   Neutrophils Relative 71  43 - 77 %   Neutro Abs 9.8 (*) 1.7 - 7.7 K/uL   Lymphocytes Relative 20  12 - 46 %   Lymphs Abs 2.8  0.7 - 4.0 K/uL   Monocytes Relative 9  3 - 12 %   Monocytes Absolute 1.2 (*) 0.1 -  1.0 K/uL   Eosinophils Relative 1  0 - 5 %   Eosinophils Absolute 0.1  0.0 - 0.7 K/uL   Basophils Relative 0  0 - 1 %   Basophils Absolute 0.0  0.0 - 0.1 K/uL  BASIC METABOLIC PANEL   Collection Time   10/15/12  1:37 PM      Component Value Range   Sodium 136  135 - 145 mEq/L   Potassium 4.2  3.5 - 5.1 mEq/L   Chloride 96  96 - 112 mEq/L   CO2 29  19 - 32 mEq/L   Glucose, Bld 213 (*) 70 - 99 mg/dL   BUN 9  6 - 23 mg/dL   Creatinine, Ser 6.96  0.50 - 1.35 mg/dL   Calcium 29.5  8.4 - 28.4 mg/dL   GFR calc non Af Amer >90  >90 mL/min   GFR calc Af Amer >90  >90 mL/min  D-DIMER, QUANTITATIVE   Collection Time   10/15/12  1:37 PM  Component Value Range   D-Dimer, Quant 0.27  0.00 - 0.48 ug/mL-FEU   Past Medical History  Diagnosis Date  . Diabetes mellitus   . Depression   . HTN (hypertension)     Cholesterol  . Dextrocardia     Past Surgical History  Procedure Date  . Neck surgery     bone spurs    Current Outpatient Prescriptions  Medication Sig Dispense Refill  . albuterol (PROVENTIL HFA;VENTOLIN HFA) 108 (90 BASE) MCG/ACT inhaler Inhale 2 puffs into the lungs every 4 (four) hours as needed. For shortness of breath      . Choline Fenofibrate (TRILIPIX) 135 MG capsule Take 135 mg by mouth daily.      . DULoxetine (CYMBALTA) 60 MG capsule Take 60 mg by mouth daily.      . folic acid (FOLVITE) 1 MG tablet Take 1 mg by mouth daily.      Marland Kitchen gabapentin (NEURONTIN) 100 MG capsule Take 100 mg by mouth 3 (three) times daily. Take 1  Three times a day       . guaiFENesin (MUCINEX) 600 MG 12 hr tablet Take 1,200 mg by mouth 2 (two) times daily.      Marland Kitchen HYDROcodone-acetaminophen (NORCO/VICODIN) 5-325 MG per tablet Take 1 tablet by mouth every 6 (six) hours as needed for pain.  15 tablet  0  . HYDROcodone-homatropine (HYDROMET) 5-1.5 MG/5ML syrup Take 5 mLs by mouth every 4 (four) hours as needed. For cough      . insulin detemir (LEVEMIR) 100 UNIT/ML injection Inject 36 Units into  the skin at bedtime.      Marland Kitchen levofloxacin (LEVAQUIN) 500 MG tablet Take 500 mg by mouth daily. For 7 days. **started on 10/10/12**      . LORazepam (ATIVAN) 1 MG tablet Take 1 mg by mouth every 4 (four) hours as needed. For nerves      . metFORMIN (GLUCOPHAGE) 1000 MG tablet Take 1,000 mg by mouth 2 (two) times daily with a meal.      . pioglitazone (ACTOS) 45 MG tablet Take 45 mg by mouth daily.      . rosuvastatin (CRESTOR) 20 MG tablet Take 20 mg by mouth daily.      . traMADol (ULTRAM) 50 MG tablet Take 50-100 mg by mouth every 6 (six) hours as needed. For pain      . omeprazole (PRILOSEC) 20 MG capsule Take 1 capsule (20 mg total) by mouth daily.  30 capsule  2  . polyethylene glycol-electrolytes (TRILYTE) 420 G solution Take 4,000 mLs by mouth as directed.  4000 mL  0    Allergies as of 10/18/2012  . (No Known Allergies)    Family History:There is no known family history of colorectal carcinoma , liver disease, or inflammatory bowel disease.  Problem Relation Age of Onset  . Diabetes Sister     Fx  . Heart defect Sister     Fx  . Arthritis Sister     Fx  . Asthma Sister     Fx  . Kidney disease Sister     Fx  . Pancreatitis Sister     History   Social History  . Marital Status: Married    Spouse Name: N/A    Number of Children: 0  . 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 Smoking status: Never Smoker   . Smokeless tobacco: Current User  Types: Chew  . Alcohol Use: No  . Drug Use: Not on file  . Sexually Active: Not on file   Review of Systems: Gen: See HPI CV: Denies chest pain, angina, palpitations, syncope, orthopnea, PND, peripheral edema, and claudication. Resp: Recent CXR in ER.  See HPI GI: Denies vomiting blood, jaundice, and fecal incontinence.  GU : Denies urinary burning, blood in urine, urinary frequency, urinary hesitancy, nocturnal urination, and urinary incontinence. MS: Denies  joint pain, limitation of movement, and swelling, stiffness, low back pain, extremity pain. Denies muscle weakness, cramps, atrophy.  Derm: Denies rash, itching, dry skin, hives, moles, warts, or unhealing ulcers.  Psych: Denies depression, anxiety, memory loss, suicidal ideation, hallucinations, paranoia, and confusion. Heme: Denies bruising, bleeding, and enlarged lymph nodes. Neuro:  Denies any headaches, dizziness, paresthesias. Endo:  Denies any problems with thyroid or adrenal function.  Physical Exam: BP 148/70  Pulse 80  Temp 98.4 F (36.9 C) (Oral)  Ht 5\' 7"  (1.702 m)  Wt 238 lb (107.956 kg)  BMI 37.28 kg/m2 No LMP for male patient. General:   Alert,  Well-developed, well-nourished, pleasant and cooperative in NAD. Head:  Normocephalic and atraumatic. Eyes:  Sclera clear, no icterus.   Conjunctiva pink. Ears:  Normal auditory acuity. Nose:  No deformity, discharge, or lesions. Mouth:  Poor dentition.  No deformity or lesions,oropharynx pink & moist. Neck:  Supple; no masses or thyromegaly. Lungs:  Clear throughout to auscultation.   No wheezes, crackles, or rhonchi. No acute distress. Heart:  Regular rate and rhythm; no murmurs, clicks, rubs,  or gallops. Abdomen:  Normal bowel sounds.  No bruits.  Soft, non-tender and non-distended without masses, hepatosplenomegaly or hernias noted.  No guarding or rebound tenderness.   Rectal:  Deferred. Msk:  Symmetrical Pulses:  Normal pulses noted. Extremities:  No edema. Neurologic:  Alert and oriented x4;  grossly normal neurologically. Skin:  Intact without significant lesions or rashes. Lymph Nodes:  No significant cervical adenopathy. Psych:  Alert and cooperative. Normal mood and affect.

## 2012-10-19 LAB — VITAMIN B12: Vitamin B-12: 294 pg/mL (ref 211–911)

## 2012-10-19 LAB — FOLATE: Folate: >20 ng/mL

## 2012-10-19 NOTE — Progress Notes (Signed)
Results faxed to PCP 

## 2012-10-19 NOTE — Progress Notes (Signed)
Quick Note:  LMOM to discuss results. If pt returns call, let him know his hgb is stable, but still a bit below nornal. His iron stores look ok. Keep EGD/colonsocopy as planned. We are still awaiting iFOBT. Let me know if he has any questions. ZO:[email or web address],WUJWJ G, MD  ______

## 2012-10-22 ENCOUNTER — Ambulatory Visit (INDEPENDENT_AMBULATORY_CARE_PROVIDER_SITE_OTHER): Payer: Managed Care, Other (non HMO) | Admitting: Urgent Care

## 2012-10-22 DIAGNOSIS — D649 Anemia, unspecified: Secondary | ICD-10-CM

## 2012-10-22 LAB — HEPATIC FUNCTION PANEL
Alkaline Phosphatase: 42 U/L
Bilirubin, Direct: 0.1 mg/dL (ref 0.01–0.4)
Bilirubin, Indirect: 0
Hgb A1c MFr Bld: 8 % — AB (ref 4.0–6.0)
Total Bilirubin: 0.3 mg/dL
Total Protein: 6.9 g/dL

## 2012-10-22 LAB — IFOBT (OCCULT BLOOD): IFOBT: POSITIVE

## 2012-10-22 NOTE — Progress Notes (Signed)
Quick Note:  Pt is aware.  CC'd Dr. Renard Matter ______

## 2012-10-22 NOTE — Progress Notes (Signed)
Quick Note:  Brett [name], Please call pt to let him know. He did have blood in his stool. Hgb is stable, but still a bit below nornal. His iron stores look ok. Keep EGD/colonsocopy as planned. Let me know if he has any questions. Thanks WU:[email or web address],NWGNF G, MD  ______

## 2012-10-22 NOTE — Progress Notes (Signed)
Faxed to PCP

## 2012-10-22 NOTE — Progress Notes (Signed)
Quick Note:  Please let pt know there was blood in his stool. Keep procedures as planned. Thanks WU:JWJXBJY,NWGNF G, MD  ______

## 2012-10-22 NOTE — Progress Notes (Signed)
Quick Note:  Pt aware. CC'd Dr. Renard Matter. ______

## 2012-10-29 ENCOUNTER — Encounter (HOSPITAL_COMMUNITY): Payer: Self-pay | Admitting: Pharmacy Technician

## 2012-11-08 MED ORDER — SODIUM CHLORIDE 0.45 % IV SOLN
INTRAVENOUS | Status: DC
  Administered 2012-11-09: 1000 mL via INTRAVENOUS

## 2012-11-09 ENCOUNTER — Ambulatory Visit (HOSPITAL_COMMUNITY)
Admission: RE | Admit: 2012-11-09 | Discharge: 2012-11-09 | Disposition: A | Discharge status: Home / Self Care | Payer: Managed Care, Other (non HMO) | Source: Ambulatory Visit | Attending: Internal Medicine | Admitting: Internal Medicine

## 2012-11-09 ENCOUNTER — Encounter (HOSPITAL_COMMUNITY): Payer: Self-pay | Admitting: *Deleted

## 2012-11-09 ENCOUNTER — Encounter (HOSPITAL_COMMUNITY)
Admission: RE | Disposition: A | Discharge status: Home / Self Care | Payer: Self-pay | Source: Ambulatory Visit | Attending: Internal Medicine

## 2012-11-09 DIAGNOSIS — R110 Nausea: Secondary | ICD-10-CM

## 2012-11-09 DIAGNOSIS — A048 Other specified bacterial intestinal infections: Secondary | ICD-10-CM

## 2012-11-09 DIAGNOSIS — E119 Type 2 diabetes mellitus without complications: Secondary | ICD-10-CM | POA: Insufficient documentation

## 2012-11-09 DIAGNOSIS — I10 Essential (primary) hypertension: Secondary | ICD-10-CM | POA: Insufficient documentation

## 2012-11-09 DIAGNOSIS — D126 Benign neoplasm of colon, unspecified: Secondary | ICD-10-CM

## 2012-11-09 DIAGNOSIS — R195 Other fecal abnormalities: Secondary | ICD-10-CM

## 2012-11-09 DIAGNOSIS — D649 Anemia, unspecified: Secondary | ICD-10-CM

## 2012-11-09 DIAGNOSIS — K921 Melena: Secondary | ICD-10-CM | POA: Insufficient documentation

## 2012-11-09 DIAGNOSIS — R109 Unspecified abdominal pain: Secondary | ICD-10-CM | POA: Insufficient documentation

## 2012-11-09 DIAGNOSIS — K219 Gastro-esophageal reflux disease without esophagitis: Secondary | ICD-10-CM

## 2012-11-09 DIAGNOSIS — Z01812 Encounter for preprocedural laboratory examination: Secondary | ICD-10-CM | POA: Insufficient documentation

## 2012-11-09 DIAGNOSIS — K2940 Chronic atrophic gastritis without bleeding: Secondary | ICD-10-CM | POA: Insufficient documentation

## 2012-11-09 DIAGNOSIS — R1010 Upper abdominal pain, unspecified: Secondary | ICD-10-CM

## 2012-11-09 DIAGNOSIS — K2960 Other gastritis without bleeding: Secondary | ICD-10-CM

## 2012-11-09 HISTORY — PX: COLONOSCOPY WITH ESOPHAGOGASTRODUODENOSCOPY (EGD): SHX5779

## 2012-11-09 HISTORY — DX: Other specified bacterial intestinal infections: A04.8

## 2012-11-09 LAB — GLUCOSE, CAPILLARY: Glucose-Capillary: 113 mg/dL — ABNORMAL HIGH (ref 70–99)

## 2012-11-09 SURGERY — COLONOSCOPY WITH ESOPHAGOGASTRODUODENOSCOPY (EGD)
Anesthesia: Moderate Sedation

## 2012-11-09 MED ORDER — MEPERIDINE HCL 100 MG/ML IJ SOLN
INTRAMUSCULAR | Status: DC | PRN
  Administered 2012-11-09: 50 mg via INTRAVENOUS
  Administered 2012-11-09: 25 mg via INTRAVENOUS
  Administered 2012-11-09: 50 mg via INTRAVENOUS
  Administered 2012-11-09: 25 mg via INTRAVENOUS

## 2012-11-09 MED ORDER — MIDAZOLAM HCL 5 MG/5ML IJ SOLN
INTRAMUSCULAR | Status: DC | PRN
  Administered 2012-11-09: 2 mg via INTRAVENOUS
  Administered 2012-11-09 (×4): 1 mg via INTRAVENOUS
  Administered 2012-11-09: 2 mg via INTRAVENOUS
  Administered 2012-11-09: 1 mg via INTRAVENOUS

## 2012-11-09 MED ORDER — MIDAZOLAM HCL 5 MG/5ML IJ SOLN
INTRAMUSCULAR | Status: AC
  Filled 2012-11-09: qty 10

## 2012-11-09 MED ORDER — ONDANSETRON HCL 4 MG/2ML IJ SOLN
INTRAMUSCULAR | Status: AC
  Filled 2012-11-09: qty 2

## 2012-11-09 MED ORDER — ONDANSETRON HCL 4 MG/2ML IJ SOLN
INTRAMUSCULAR | Status: DC | PRN
  Administered 2012-11-09: 4 mg via INTRAVENOUS

## 2012-11-09 MED ORDER — MEPERIDINE HCL 100 MG/ML IJ SOLN
INTRAMUSCULAR | Status: AC
  Filled 2012-11-09: qty 2

## 2012-11-09 MED ORDER — STERILE WATER FOR IRRIGATION IR SOLN
Status: DC | PRN
  Administered 2012-11-09: 11:00:00

## 2012-11-09 NOTE — H&P (View-Only) (Signed)
Referring Provider: McInnis, Angus G, MD Primary Care Physician:  MCINNIS,ANGUS G, MD Primary Gastroenterologist:  Dr. Rourk  Chief Complaint  Patient presents with  . Abdominal Pain    HPI:  Brett [name] is a 61 y.o. male here as a referral from Dr. McInnis for abdominal pain, nausea & constipation.  C/o "stomach pain" occasionally.  He has had intermittent pain that lasts a couple hrs.  The pain is worse after eating.  C/o nausea but denies vomiting.  C/o soreness in his upper abdomen for the past year.  He has proctalgia & anorectal pruritis & he thinks he has hemorrhoids.  He has had small amounts of bleeding intermittently for over a year.  He has severe acid reflux & water brash that is bad at night.  He has not been on PPI recently.  Denies dysphagia or odynophagia.  Never had EGD or colonoscopy.  He reports he thinks he has fever, but did not check with a thermometer.  He has had diaphoresis & chills.  His weight has been steadily increasing.   He denies constipation, diarrhea, rectal bleeding, or melena.  He has occasional hard stools & straining.  She has been experiencing fatigue & SOB for a few weeks.  He went to the ER about 3 days ago.  Hgb was 10.3.   Recent Results (from the past 2016 hour(s))  TROPONIN I   Collection Time   10/15/12  1:37 PM      Component Value Range   Troponin I <0.30  <0.30 ng/mL  CBC WITH DIFFERENTIAL   Collection Time   10/15/12  1:37 PM      Component Value Range   WBC 13.9 (*) 4.0 - 10.5 K/uL   RBC 3.46 (*) 4.22 - 5.81 MIL/uL   Hemoglobin 10.3 (*) 13.0 - 17.0 g/dL   HCT 31.8 (*) 39.0 - 52.0 %   MCV 91.9  78.0 - 100.0 fL   MCH 29.8  26.0 - 34.0 pg   MCHC 32.4  30.0 - 36.0 g/dL   RDW 13.0  11.5 - 15.5 %   Platelets 614 (*) 150 - 400 K/uL   Neutrophils Relative 71  43 - 77 %   Neutro Abs 9.8 (*) 1.7 - 7.7 K/uL   Lymphocytes Relative 20  12 - 46 %   Lymphs Abs 2.8  0.7 - 4.0 K/uL   Monocytes Relative 9  3 - 12 %   Monocytes Absolute 1.2 (*) 0.1 -  1.0 K/uL   Eosinophils Relative 1  0 - 5 %   Eosinophils Absolute 0.1  0.0 - 0.7 K/uL   Basophils Relative 0  0 - 1 %   Basophils Absolute 0.0  0.0 - 0.1 K/uL  BASIC METABOLIC PANEL   Collection Time   10/15/12  1:37 PM      Component Value Range   Sodium 136  135 - 145 mEq/L   Potassium 4.2  3.5 - 5.1 mEq/L   Chloride 96  96 - 112 mEq/L   CO2 29  19 - 32 mEq/L   Glucose, Bld 213 (*) 70 - 99 mg/dL   BUN 9  6 - 23 mg/dL   Creatinine, Ser 0.81  0.50 - 1.35 mg/dL   Calcium 10.1  8.4 - 10.5 mg/dL   GFR calc non Af Amer >90  >90 mL/min   GFR calc Af Amer >90  >90 mL/min  D-DIMER, QUANTITATIVE   Collection Time   10/15/12  1:37 PM        Component Value Range   D-Dimer, Quant 0.27  0.00 - 0.48 ug/mL-FEU   Past Medical History  Diagnosis Date  . Diabetes mellitus   . Depression   . HTN (hypertension)     Cholesterol  . Dextrocardia     Past Surgical History  Procedure Date  . Neck surgery     bone spurs    Current Outpatient Prescriptions  Medication Sig Dispense Refill  . albuterol (PROVENTIL HFA;VENTOLIN HFA) 108 (90 BASE) MCG/ACT inhaler Inhale 2 puffs into the lungs every 4 (four) hours as needed. For shortness of breath      . Choline Fenofibrate (TRILIPIX) 135 MG capsule Take 135 mg by mouth daily.      . DULoxetine (CYMBALTA) 60 MG capsule Take 60 mg by mouth daily.      . folic acid (FOLVITE) 1 MG tablet Take 1 mg by mouth daily.      . gabapentin (NEURONTIN) 100 MG capsule Take 100 mg by mouth 3 (three) times daily. Take 1  Three times a day       . guaiFENesin (MUCINEX) 600 MG 12 hr tablet Take 1,200 mg by mouth 2 (two) times daily.      . HYDROcodone-acetaminophen (NORCO/VICODIN) 5-325 MG per tablet Take 1 tablet by mouth every 6 (six) hours as needed for pain.  15 tablet  0  . HYDROcodone-homatropine (HYDROMET) 5-1.5 MG/5ML syrup Take 5 mLs by mouth every 4 (four) hours as needed. For cough      . insulin detemir (LEVEMIR) 100 UNIT/ML injection Inject 36 Units into  the skin at bedtime.      . levofloxacin (LEVAQUIN) 500 MG tablet Take 500 mg by mouth daily. For 7 days. **started on 10/10/12**      . LORazepam (ATIVAN) 1 MG tablet Take 1 mg by mouth every 4 (four) hours as needed. For nerves      . metFORMIN (GLUCOPHAGE) 1000 MG tablet Take 1,000 mg by mouth 2 (two) times daily with a meal.      . pioglitazone (ACTOS) 45 MG tablet Take 45 mg by mouth daily.      . rosuvastatin (CRESTOR) 20 MG tablet Take 20 mg by mouth daily.      . traMADol (ULTRAM) 50 MG tablet Take 50-100 mg by mouth every 6 (six) hours as needed. For pain      . omeprazole (PRILOSEC) 20 MG capsule Take 1 capsule (20 mg total) by mouth daily.  30 capsule  2  . polyethylene glycol-electrolytes (TRILYTE) 420 G solution Take 4,000 mLs by mouth as directed.  4000 mL  0    Allergies as of 10/18/2012  . (No Known Allergies)    Family History:There is no known family history of colorectal carcinoma , liver disease, or inflammatory bowel disease.  Problem Relation Age of Onset  . Diabetes Sister     Fx  . Heart defect Sister     Fx  . Arthritis Sister     Fx  . Asthma Sister     Fx  . Kidney disease Sister     Fx  . Pancreatitis Sister     History   Social History  . Marital Status: Married    Spouse Name: N/A    Number of Children: 0  . 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 Smoking status: Never Smoker   . Smokeless tobacco: Current User      Types: Chew  . Alcohol Use: No  . Drug Use: Not on file  . Sexually Active: Not on file   Review of Systems: Gen: See HPI CV: Denies chest pain, angina, palpitations, syncope, orthopnea, PND, peripheral edema, and claudication. Resp: Recent CXR in ER.  See HPI GI: Denies vomiting blood, jaundice, and fecal incontinence.  GU : Denies urinary burning, blood in urine, urinary frequency, urinary hesitancy, nocturnal urination, and urinary incontinence. MS: Denies  joint pain, limitation of movement, and swelling, stiffness, low back pain, extremity pain. Denies muscle weakness, cramps, atrophy.  Derm: Denies rash, itching, dry skin, hives, moles, warts, or unhealing ulcers.  Psych: Denies depression, anxiety, memory loss, suicidal ideation, hallucinations, paranoia, and confusion. Heme: Denies bruising, bleeding, and enlarged lymph nodes. Neuro:  Denies any headaches, dizziness, paresthesias. Endo:  Denies any problems with thyroid or adrenal function.  Physical Exam: BP 148/70  Pulse 80  Temp 98.4 F (36.9 C) (Oral)  Ht 5' 7" (1.702 m)  Wt 238 lb (107.956 kg)  BMI 37.28 kg/m2 No LMP for male patient. General:   Alert,  Well-developed, well-nourished, pleasant and cooperative in NAD. Head:  Normocephalic and atraumatic. Eyes:  Sclera clear, no icterus.   Conjunctiva pink. Ears:  Normal auditory acuity. Nose:  No deformity, discharge, or lesions. Mouth:  Poor dentition.  No deformity or lesions,oropharynx pink & moist. Neck:  Supple; no masses or thyromegaly. Lungs:  Clear throughout to auscultation.   No wheezes, crackles, or rhonchi. No acute distress. Heart:  Regular rate and rhythm; no murmurs, clicks, rubs,  or gallops. Abdomen:  Normal bowel sounds.  No bruits.  Soft, non-tender and non-distended without masses, hepatosplenomegaly or hernias noted.  No guarding or rebound tenderness.   Rectal:  Deferred. Msk:  Symmetrical Pulses:  Normal pulses noted. Extremities:  No edema. Neurologic:  Alert and oriented x4;  grossly normal neurologically. Skin:  Intact without significant lesions or rashes. Lymph Nodes:  No significant cervical adenopathy. Psych:  Alert and cooperative. Normal mood and affect.  

## 2012-11-09 NOTE — Interval H&P Note (Signed)
History and Physical Interval Note:  11/09/2012 10:42 AM  Brett [name]  has presented today for surgery, with the diagnosis of UPPER ABDOMINAL PAIN, GERD, PROTALGIA, ANEMIA  AND NAUSEA  The various methods of treatment have been discussed with the patient and family. After consideration of risks, benefits and other options for treatment, the patient has consented to  Procedure(s) with comments: COLONOSCOPY WITH ESOPHAGOGASTRODUODENOSCOPY (EGD) (N/A) - 10:30 as a surgical intervention .  The patient's history has been reviewed, patient examined, no change in status, stable for surgery.  I have reviewed the patient's chart and labs.  Questions were answered to the patient's satisfaction.     Sihaam Chrobak  EGD and colonoscopy per plan. Upper abdominal pain has resolved since starting Prilosec 20 mg orally daily.  The risks, benefits, limitations, imponderables and alternatives regarding both EGD and colonoscopy have been reviewed with the patient. Questions have been answered. All parties agreeable.

## 2012-11-09 NOTE — Op Note (Signed)
[facility] [address] [place], [address]   ENDOSCOPY PROCEDURE REPORT  PATIENT: Brett [name], Brett [name]  MR#: [id number] BIRTHDATE: Dec 27, 1950 , 61  yrs. old GENDER: Male ENDOSCOPIST:  R.  Roetta Sessions, MD FACP FACG REFERRED BY:  Butch Penny, [name] PROCEDURE DATE:  11/09/2012 PROCEDURE:     EGD with gastric biopsy  INDICATIONS:      Upper abdominal pain; Hemoccult-positive stool and anemia. Recent upper abdominal pain totally abolished with Prilosec 20 mg orally daily  INFORMED CONSENT:   The risks, benefits, limitations, alternatives and imponderables have been discussed.  The potential for biopsy, esophogeal dilation, etc. have also been reviewed.  Questions have been answered.  All parties agreeable.  Please see the history and physical in the medical record for more information.  MEDICATIONS: Versed 5 mg IV and Demerol 125 mg IV in divided doses. Cetacaine spray. Zofran 4 mg IV  DESCRIPTION OF PROCEDURE:   The Pentax Gastroscope [id number] endoscope was introduced through the mouth and advanced to the second portion of the duodenum without difficulty or limitations. The mucosal surfaces were surveyed very carefully during advancement of the scope and upon withdrawal.  Retroflexion view of the proximal stomach and esophagogastric junction was performed.      FINDINGS:   Normal appearing tubular esophagus. Stomach  reversed with body /antrum/ pylorus extending leftward instead of rightward. Patient had small tiny antral erosions and gastric erythema involving primarily the antrum. There is a small hiatal hernia. No ulcer or infiltrating process.; Pylorus patent. The mucosa of the first and second portion of the duodenum appeared normal.  THERAPEUTIC / DIAGNOSTIC MANEUVERS PERFORMED:   Biopsies the gastric antrum taken for histologic study.   COMPLICATIONS:  None  IMPRESSION: Normal esophagus. 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 Gastric erosions-status post  RECOMMENDATIONS: Continue Prilosec 20 mg daily. Followup on pathology. See colonoscopy report.    _______________________________ R. Roetta Sessions, MD FACP [facility] eSigned:  R. Roetta Sessions, MD FACP [facility] 11/09/2012 11:11 AM     CC:

## 2012-11-09 NOTE — Op Note (Signed)
[facility] [address] [place], [address]   COLONOSCOPY PROCEDURE REPORT  PATIENT: Brett [name], Brett [name]  MR#:         [id number] BIRTHDATE: 1951/07/15 , 61  yrs. old GENDER: Male ENDOSCOPIST: R.  Roetta Sessions, MD FACP FACG REFERRED BY:  Butch Penny, [name] PROCEDURE DATE:  11/09/2012 PROCEDURE:     ileocolonoscopy with biopsy and snare polypectomy  INDICATIONS: Hemoccult-positive stool; anemia-no prior colonoscopy  INFORMED CONSENT:  The risks, benefits, alternatives and imponderables including but not limited to bleeding, perforation as well as the possibility of a missed lesion have been reviewed.  The potential for biopsy, lesion removal, etc. have also been discussed.  Questions have been answered.  All parties agreeable. Please see the history and physical in the medical record for more information.  MEDICATIONS: Versed 9 mg IV and Demerol 150 mg IV in divided doses. Zofran 4 mg IV  DESCRIPTION OF PROCEDURE:  After a digital rectal exam was performed, the EG-[id number] ([id number]) and [id number] ([id number]) colonoscope was advanced from the anus through the rectum and colon to the area of the cecum, ileocecal valve and appendiceal orifice. The cecum was deeply intubated.  These structures were well-seen and photographed for the record.  From the level of the cecum and ileocecal valve, the scope was slowly and cautiously withdrawn. The mucosal surfaces were carefully surveyed utilizing scope tip deflection to facilitate fold flattening as needed.  The scope was pulled down into the rectum where a thorough examination including retroflexion was performed.    FINDINGS:  adequate preparation. Normal rectum. (1) diminutive polyp in the mid descending segment. (1) 4 mm polyp in the mid descending segment; otherwise, the remainder of the colonic mucosa appeared normal. The distal 10 cm of terminal ileal mucosa also appeared normal.  THERAPEUTIC / DIAGNOSTIC  MANEUVERS PERFORMED:  The above-mentioned polyps were cold biopsy removed and cold snare removed, respectively.  COMPLICATIONS: None  CECAL WITHDRAWAL TIME:  11 minutes  IMPRESSION:  Colonic polyps-removed as described above.  RECOMMENDATIONS: Followup on pathology. See EGD report   _______________________________ eSigned:  R. Roetta Sessions, MD FACP [facility] 11/09/2012 12:00 PM   CC:    PATIENT NAME:  Brett [name], Brett [name] MR#: [id number]

## 2012-11-12 ENCOUNTER — Encounter (HOSPITAL_COMMUNITY): Payer: Self-pay | Admitting: Internal Medicine

## 2012-11-15 ENCOUNTER — Encounter: Payer: Self-pay | Admitting: Internal Medicine

## 2012-11-15 ENCOUNTER — Encounter: Payer: Self-pay | Admitting: *Deleted

## 2012-11-15 ENCOUNTER — Telehealth: Payer: Self-pay

## 2012-11-15 NOTE — Telephone Encounter (Signed)
Path and letters faxed to PCP, letters mailed to pt, recall made 

## 2012-11-15 NOTE — Telephone Encounter (Signed)
Tried to call pt- LMOM 

## 2012-11-15 NOTE — Telephone Encounter (Signed)
Per RMR- Letter from: Corbin Ade Reason for Letter: Results Review  Send letter to patient.  Send copy of letter with path to referring provider and PCP.  Raynelle Fanning, patient needs Pylera x 10 days - patient needs twice a day Prilosec during this time then drop back down to once daily thereafter. No refills on Pylera

## 2012-11-19 MED ORDER — BIS SUBCIT-METRONID-TETRACYC 140-125-125 MG PO CAPS: 3.0000 | ORAL_CAPSULE | Freq: Three times a day (TID) | ORAL | Status: DC

## 2012-11-19 NOTE — Telephone Encounter (Signed)
Tried to call pt- LMOM. rx sent to pharmacy.

## 2012-11-20 NOTE — Telephone Encounter (Signed)
Pt aware.

## 2012-11-26 ENCOUNTER — Telehealth: Payer: Self-pay | Admitting: Internal Medicine

## 2012-11-26 NOTE — Telephone Encounter (Signed)
Agree 

## 2012-11-26 NOTE — Telephone Encounter (Signed)
Spoke with pt about the pylera that we put him on. He has 2 days left and has noticed some nausea. It is not severe, no vomiting, no pain. Advised him to try to complete the course to ensure we get rid of the hpylori bacteria and if he notices any dizziness, SOB, vomiting etc he needs to call us back or go to ED. Pt verbalized understanding and agreed with plan.

## 2012-11-26 NOTE — Telephone Encounter (Signed)
Pt called to speak with nurse about medication RMR has started him on and the side effects it can cause. Please call him back at [phone]

## 2013-01-17 ENCOUNTER — Other Ambulatory Visit: Payer: Self-pay | Admitting: Urgent Care

## 2013-08-30 ENCOUNTER — Encounter: Payer: Self-pay | Admitting: Internal Medicine

## 2013-08-30 ENCOUNTER — Ambulatory Visit (INDEPENDENT_AMBULATORY_CARE_PROVIDER_SITE_OTHER): Payer: Managed Care, Other (non HMO) | Admitting: Internal Medicine

## 2013-08-30 ENCOUNTER — Encounter (INDEPENDENT_AMBULATORY_CARE_PROVIDER_SITE_OTHER): Payer: Self-pay

## 2013-08-30 VITALS — BP 135/64 | HR 83 | Temp 97.6°F | Wt 242.4 lb

## 2013-08-30 DIAGNOSIS — R109 Unspecified abdominal pain: Secondary | ICD-10-CM

## 2013-08-30 DIAGNOSIS — D649 Anemia, unspecified: Secondary | ICD-10-CM

## 2013-08-30 DIAGNOSIS — K5900 Constipation, unspecified: Secondary | ICD-10-CM

## 2013-08-30 DIAGNOSIS — K219 Gastro-esophageal reflux disease without esophagitis: Secondary | ICD-10-CM

## 2013-08-30 DIAGNOSIS — R110 Nausea: Secondary | ICD-10-CM

## 2013-08-30 LAB — CBC WITH DIFFERENTIAL/PLATELET
Basophils Absolute: 0 10*3/uL (ref 0.0–0.1)
Basophils Relative: 0 % (ref 0–1)
Eosinophils Absolute: 0.1 10*3/uL (ref 0.0–0.7)
Eosinophils Relative: 1 % (ref 0–5)
HCT: 32.7 % — ABNORMAL LOW (ref 39.0–52.0)
Hemoglobin: 10.6 g/dL — ABNORMAL LOW (ref 13.0–17.0)
Lymphocytes Relative: 30 % (ref 12–46)
Lymphs Abs: 2.6 10*3/uL (ref 0.7–4.0)
MCH: 28.5 pg (ref 26.0–34.0)
MCHC: 32.4 g/dL (ref 30.0–36.0)
MCV: 87.9 fL (ref 78.0–100.0)
Monocytes Absolute: 0.5 10*3/uL (ref 0.1–1.0)
Monocytes Relative: 6 % (ref 3–12)
Neutro Abs: 5.5 10*3/uL (ref 1.7–7.7)
Neutrophils Relative %: 63 % (ref 43–77)
Platelets: 387 10*3/uL (ref 150–400)
RBC: 3.72 MIL/uL — ABNORMAL LOW (ref 4.22–5.81)
RDW: 15.5 % (ref 11.5–15.5)
WBC: 8.7 10*3/uL (ref 4.0–10.5)

## 2013-08-30 LAB — COMPREHENSIVE METABOLIC PANEL
AST: 14 U/L (ref 0–37)
Alkaline Phosphatase: 35 U/L — ABNORMAL LOW (ref 39–117)
BUN: 10 mg/dL (ref 6–23)
Creat: 0.93 mg/dL (ref 0.50–1.35)
Total Bilirubin: 0.2 mg/dL — ABNORMAL LOW (ref 0.3–1.2)

## 2013-08-30 LAB — LIPASE: Lipase: 13 U/L (ref 0–75)

## 2013-08-30 LAB — HEMOGLOBIN A1C: Mean Plasma Glucose: 177 mg/dL — ABNORMAL HIGH (ref <117)

## 2013-08-30 NOTE — Progress Notes (Signed)
Primary Care Physician:  Alice Reichert, MD Primary Gastroenterologist:  Dr. Jena Gauss     Pre-Procedure History & Physical: HPI:  Brett [name] is a 62 y.o. male here for further evaluation for several week history of nausea but no vomiting worsening of typical reflux symptoms. Omeprazole 20 mg daily for years. 40 pound weight gain past 5 years. Has diabetes;  takes meds but does not ever check blood sugar;s hemoglobin A1c unknown  - treated for H. pylori gastritis earlier this year. Hemoccult-positive stool worked up with EGD and colonoscopy (adenoma);  no melena or hematochezia has been constipated recently as well. No exercise. Takes Alka-Seltzer lately for nausea which seems to have helped.    Past Medical History  Diagnosis Date  . Diabetes mellitus   . Depression   . HTN (hypertension)     Cholesterol  . Dextrocardia   . H. pylori infection 11/09/2012    treated with pylera    Past Surgical History  Procedure Laterality Date  . Neck surgery      bone spurs  . Colonoscopy with esophagogastroduodenoscopy (egd) N/A 11/09/2012    Dr. 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 hpylori on bx- treated with pylera. TCS- normal rectum. 1 diminutive polyp in the mid descending segment. 1-48mm polyp in the mid desending segment o/w the remainder of the colonic mucosa appeared normal. tubular adenoma on bx    Prior to Admission medications   Medication Sig Start Date End Date Taking? Authorizing Provider  albuterol (PROVENTIL HFA;VENTOLIN HFA) 108 (90 BASE) MCG/ACT inhaler Inhale 2 puffs into the lungs every 4 (four) hours as needed. For shortness of breath    Historical Provider, MD  bismuth-metronidazole-tetracycline (PYLERA) [phone] MG per capsule Take 3 capsules by mouth 4 (four) times daily -  before meals and at bedtime. 11/19/12   Corbin Ade, MD  Choline Fenofibrate (TRILIPIX) 135 MG capsule Take  135 mg by mouth daily.    Historical Provider, MD  DULoxetine (CYMBALTA) 60 MG capsule Take 60 mg by mouth daily.    Historical Provider, MD  folic acid (FOLVITE) 1 MG tablet Take 1 mg by mouth daily.    Historical Provider, MD  gabapentin (NEURONTIN) 100 MG capsule Take 100 mg by mouth 3 (three) times daily.     Historical Provider, MD  guaiFENesin (MUCINEX) 600 MG 12 hr tablet Take 1,200 mg by mouth 2 (two) times daily.    Historical Provider, MD  HYDROcodone-acetaminophen (NORCO/VICODIN) 5-325 MG per tablet Take 1 tablet by mouth every 6 (six) hours as needed for pain. 10/15/12   Benny Lennert, MD  HYDROcodone-homatropine (HYDROMET) 5-1.5 MG/5ML syrup Take 5 mLs by mouth every 4 (four) hours as needed. For cough    Historical Provider, MD  insulin detemir (LEVEMIR) 100 UNIT/ML injection Inject 36 Units into the skin at bedtime.    Historical Provider, MD  levofloxacin (LEVAQUIN) 500 MG tablet Take 500 mg by mouth daily. For 7 days. **started on 10/10/12**    Historical Provider, MD  LORazepam (ATIVAN) 1 MG tablet Take 1 mg by mouth every 4 (four) hours as needed. For nerves    Historical Provider, MD  metFORMIN (GLUCOPHAGE) 1000 MG tablet Take 1,000 mg by mouth 2 (two) times daily with a meal.    Historical Provider, MD  omeprazole (PRILOSEC) 20 MG capsule TAKE ONE CAPSULE BY MOUTH DAILY. 01/17/13   Tiffany Kocher, PA-C  pioglitazone (ACTOS) 45 MG tablet Take  45 mg by mouth daily.    Historical Provider, MD  polyethylene glycol-electrolytes (TRILYTE) 420 G solution Take 4,000 mLs by mouth as directed. 10/18/12   Corbin Ade, MD  rosuvastatin (CRESTOR) 20 MG tablet Take 20 mg by mouth daily.    Historical Provider, MD  traMADol (ULTRAM) 50 MG tablet Take 50-100 mg by mouth every 6 (six) hours as needed. For pain    Historical Provider, MD    Allergies as of 08/30/2013  . (No Known Allergies)    Family History  Problem Relation Age of Onset  . Diabetes Sister     Fx  . Heart defect Sister      Fx  . Arthritis Sister     Fx  . Asthma Sister     Fx  . Kidney disease Sister     Fx  . Pancreatitis Sister     History   Social History  . Marital Status: Married    Spouse Name: N/A    Number of Children: 0  . 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 Smoking status: Never Smoker   . Smokeless tobacco: Current User    Types: Chew  . Alcohol Use: No  . Drug Use: Not on file  . Sexual Activity: Not on file   Other Topics Concern  . Not on file   Social History Narrative  . No narrative on file    Review of Systems: See HPI, otherwise negative ROS  Physical Exam: There were no vitals taken for this visit. General:   Alert,  Well-developed, obese and pleasant and cooperative in NAD. Accompanied by spouse. Skin:  Intact without significant lesions or rashes. Eyes:  Sclera clear, no icterus.   Conjunctiva pink. Ears:  Normal auditory acuity. Nose:  No deformity, discharge,  or lesions. Mouth:  No deformity or lesions. Neck:  Supple; no masses or thyromegaly. No significant cervical adenopathy. Lungs:  Clear throughout to auscultation.   No wheezes, crackles, or rhonchi. No acute distress. Heart:  Regular rate and rhythm; no murmurs, clicks, rubs,  or gallops. Abdomen: Significantly obese. Non-distended, normal bowel sounds.  Diastases recti present. Positive bowel sounds. No succussion splash. Soft and nontender without appreciable mass or hepatosplenomegaly.  Pulses:  Normal pulses noted. Extremities:  Without clubbing or edema.  Impression:    Worsening of GERD in the setting of steady weight gain and likely poorly controlled diabetes. Nausea but no vomiting. No abdominal pain. Gallbladder remains in situ. May well have gastroparesis. Recently treated for H. pylori infection -  No alarm symptoms at this time.  No abdominal pain. Constipation likely functional in origin.   Recommendations:   GERD  and Constipation information  Stop omeprazole; trial of Dexilant 60 mg daily - samples  Miralax one capful at bedtime as needed for constipation  Cbc, comp 12, lipase, hgb a1c  Limit use of alka-seltzer  Good glycemic control very important  Further recommendations to follow

## 2013-08-30 NOTE — Patient Instructions (Signed)
GERD and Constipation information  Stop omeprazole; trial of Dexilant 60 mg daily - samples  Miralax one capful at bedtime as needed for constipation  Cbc, comp 12, lipase, hgb a1c  Limit use of alka-seltzer  Further recommendations to follow

## 2013-09-13 ENCOUNTER — Other Ambulatory Visit: Payer: Self-pay

## 2013-09-13 ENCOUNTER — Telehealth: Payer: Self-pay | Admitting: *Deleted

## 2013-09-13 MED ORDER — DEXLANSOPRAZOLE 60 MG PO CPDR: 60.0000 mg | DELAYED_RELEASE_CAPSULE | Freq: Every day | ORAL | Status: DC

## 2013-09-13 NOTE — Telephone Encounter (Signed)
Pt called asking about his capsul studies. Please advise

## 2013-09-16 ENCOUNTER — Other Ambulatory Visit: Payer: Self-pay | Admitting: Internal Medicine

## 2013-09-16 NOTE — Telephone Encounter (Signed)
Brett [name], were you scheduling this or was the patient supposed to wait until he comes back in for an ov?

## 2013-09-16 NOTE — Telephone Encounter (Signed)
I have GIVENS scheduled for Monday January 26th at 7:00 am and I have sent him instructions

## 2013-09-16 NOTE — Telephone Encounter (Signed)
Im not sure if I am to schedule now or wait until office visit

## 2013-09-26 ENCOUNTER — Encounter (HOSPITAL_COMMUNITY): Payer: Self-pay | Admitting: Pharmacy Technician

## 2013-10-07 ENCOUNTER — Encounter (HOSPITAL_COMMUNITY)
Admission: RE | Disposition: A | Discharge status: Home / Self Care | Payer: Self-pay | Source: Ambulatory Visit | Attending: Internal Medicine

## 2013-10-07 ENCOUNTER — Ambulatory Visit (HOSPITAL_COMMUNITY)
Admission: RE | Admit: 2013-10-07 | Discharge: 2013-10-07 | Disposition: A | Discharge status: Home / Self Care | Payer: Managed Care, Other (non HMO) | Source: Ambulatory Visit | Attending: Internal Medicine | Admitting: Internal Medicine

## 2013-10-07 ENCOUNTER — Encounter (HOSPITAL_COMMUNITY): Payer: Self-pay | Admitting: *Deleted

## 2013-10-07 DIAGNOSIS — D649 Anemia, unspecified: Secondary | ICD-10-CM | POA: Insufficient documentation

## 2013-10-07 DIAGNOSIS — K921 Melena: Secondary | ICD-10-CM | POA: Insufficient documentation

## 2013-10-07 HISTORY — PX: GIVENS CAPSULE STUDY: SHX5432

## 2013-10-07 SURGERY — IMAGING PROCEDURE, GI TRACT, INTRALUMINAL, VIA CAPSULE

## 2013-10-09 ENCOUNTER — Encounter (HOSPITAL_COMMUNITY): Payer: Self-pay | Admitting: Internal Medicine

## 2013-10-09 DIAGNOSIS — D649 Anemia, unspecified: Secondary | ICD-10-CM

## 2013-10-09 DIAGNOSIS — R195 Other fecal abnormalities: Secondary | ICD-10-CM

## 2013-10-10 ENCOUNTER — Telehealth: Payer: Self-pay | Admitting: Gastroenterology

## 2013-10-10 NOTE — Telephone Encounter (Signed)
Capsule study completed. No source for anemia or heme positive stool noted. I question delayed gastric emptying however. If he continues to have significant nausea, could pursue GES.   Let's update iron and ferritin now. His Hgb has remained stable for the past year. Likely anemia of chronic disease. No further GI work-up indicated for anemia unless otherwise indicated.

## 2013-10-10 NOTE — Procedures (Signed)
Small Bowel Givens Capsule Study Procedure date:  October 07, 2013  Referring Provider:  Dr. Gala Romney PCP:  Dr. Everette Rank  Indication for procedure:   63 year old male with chronic anemia, Hgb staying around the 10 range, iron 85, ferritin 95, and heme positive stool, with EGD and colonoscopy on file. Found to have H.pylori gastritis and treated with Pylera in Feb 2014. Colonoscopy with tubular adenoma. Seen recently as an outpatient, December 2014, with nausea, GERD, and constipation. CBC rechecked and found to have Hgb staying around the 10 range. To conclude GI work-up, capsule study requested and completed.   Findings:   Capsule study was complete. Although no gastric emptying study on file, question  delayed gastric emptying in the setting of diabetes, as the capsule was retained in the stomach for over an hour. Prep was fair, with some debris throughout small bowel that obscured view. However, no AVMs, ulcerations, mass, or overt source for GI bleeding noted.   First Gastric image:  00:00:46 First Duodenal image: 01:30:32 First Cecal image: 2:57:09 Gastric Passage time: 1h 71m Small Bowel Passage time:  1h 66m  Summary & Recommendations: Anemia likely of chronic disease; iron levels normal in February 2014. No significant findings in small bowel to account for anemia or heme positive stool. Two questionable images to be reviewed in further detail with attending physician. Would recommend updated iron and ferritin, tight control of GERD symptoms, weight loss, and possible gastric emptying studying if nausea persists. Otherwise, anemia work-up complete from a GI standpoint.   Orvil Feil, ANP-BC [facility]

## 2013-10-18 NOTE — Telephone Encounter (Signed)
Tried to call pt- NA 

## 2013-10-22 NOTE — Telephone Encounter (Signed)
Tried to call pt- LMOM 

## 2013-10-23 ENCOUNTER — Other Ambulatory Visit: Payer: Self-pay

## 2013-10-23 ENCOUNTER — Other Ambulatory Visit: Payer: Self-pay | Admitting: Gastroenterology

## 2013-10-23 DIAGNOSIS — D649 Anemia, unspecified: Secondary | ICD-10-CM

## 2013-10-23 NOTE — Telephone Encounter (Signed)
Pt is aware. He stated he is not having any nausea right now and he is doing good. Lab order faxed to the lab. He said he would go next week and have it done.

## 2013-11-22 LAB — FERRITIN: FERRITIN: 33 ng/mL (ref 22–322)

## 2013-11-22 LAB — IRON: Iron: 103 ug/dL (ref 42–165)

## 2013-12-04 NOTE — Progress Notes (Signed)
Quick Note:  Ferritin low normal. Iron normal.  Is he taking iron? Recommend referral to Hematology due to [place]. GI work-up complete from our standpoint. ______

## 2013-12-17 ENCOUNTER — Telehealth: Payer: Self-pay | Admitting: Internal Medicine

## 2013-12-17 NOTE — Telephone Encounter (Signed)
Patient's wife called late this afternoon regarding a letter he had received from [facility] about his iron being low and referring him to a hematologist. I was told to tell them they we are working on that and that [facility] would be getting back in touch with patient. Pt's wife had concerns about their insurance covering appointment because she said that RMR wasn't listed in network through [place]. Please advise and call Mrs Cotham at [phone]

## 2013-12-18 ENCOUNTER — Other Ambulatory Visit: Payer: Self-pay | Admitting: Gastroenterology

## 2013-12-18 DIAGNOSIS — D509 Iron deficiency anemia, unspecified: Secondary | ICD-10-CM

## 2013-12-18 NOTE — Telephone Encounter (Signed)
I have LMOM for Brett [name] to return my call

## 2013-12-18 NOTE — Telephone Encounter (Signed)
Referral has been sent to [facility] 

## 2014-01-03 ENCOUNTER — Encounter (HOSPITAL_COMMUNITY): Payer: Self-pay

## 2014-01-03 ENCOUNTER — Encounter (HOSPITAL_COMMUNITY): Payer: Managed Care, Other (non HMO) | Attending: Hematology and Oncology

## 2014-01-03 VITALS — BP 165/77 | HR 79 | Temp 97.7°F | Resp 20 | Ht 70.0 in | Wt 229.0 lb

## 2014-01-03 DIAGNOSIS — F329 Major depressive disorder, single episode, unspecified: Secondary | ICD-10-CM | POA: Insufficient documentation

## 2014-01-03 DIAGNOSIS — G609 Hereditary and idiopathic neuropathy, unspecified: Secondary | ICD-10-CM

## 2014-01-03 DIAGNOSIS — R350 Frequency of micturition: Secondary | ICD-10-CM | POA: Insufficient documentation

## 2014-01-03 DIAGNOSIS — F3289 Other specified depressive episodes: Secondary | ICD-10-CM | POA: Insufficient documentation

## 2014-01-03 DIAGNOSIS — D509 Iron deficiency anemia, unspecified: Secondary | ICD-10-CM | POA: Insufficient documentation

## 2014-01-03 DIAGNOSIS — E1142 Type 2 diabetes mellitus with diabetic polyneuropathy: Secondary | ICD-10-CM | POA: Insufficient documentation

## 2014-01-03 DIAGNOSIS — D649 Anemia, unspecified: Secondary | ICD-10-CM

## 2014-01-03 DIAGNOSIS — F17200 Nicotine dependence, unspecified, uncomplicated: Secondary | ICD-10-CM | POA: Insufficient documentation

## 2014-01-03 DIAGNOSIS — J309 Allergic rhinitis, unspecified: Secondary | ICD-10-CM

## 2014-01-03 DIAGNOSIS — K909 Intestinal malabsorption, unspecified: Secondary | ICD-10-CM | POA: Insufficient documentation

## 2014-01-03 DIAGNOSIS — B9681 Helicobacter pylori [H. pylori] as the cause of diseases classified elsewhere: Secondary | ICD-10-CM | POA: Insufficient documentation

## 2014-01-03 DIAGNOSIS — Z794 Long term (current) use of insulin: Secondary | ICD-10-CM | POA: Insufficient documentation

## 2014-01-03 DIAGNOSIS — Z860100 Personal history of colon polyps, unspecified: Secondary | ICD-10-CM | POA: Insufficient documentation

## 2014-01-03 DIAGNOSIS — G629 Polyneuropathy, unspecified: Secondary | ICD-10-CM

## 2014-01-03 DIAGNOSIS — E1149 Type 2 diabetes mellitus with other diabetic neurological complication: Secondary | ICD-10-CM | POA: Insufficient documentation

## 2014-01-03 DIAGNOSIS — Q248 Other specified congenital malformations of heart: Secondary | ICD-10-CM | POA: Insufficient documentation

## 2014-01-03 DIAGNOSIS — K2970 Gastritis, unspecified, without bleeding: Secondary | ICD-10-CM

## 2014-01-03 DIAGNOSIS — E119 Type 2 diabetes mellitus without complications: Secondary | ICD-10-CM | POA: Insufficient documentation

## 2014-01-03 DIAGNOSIS — I10 Essential (primary) hypertension: Secondary | ICD-10-CM | POA: Insufficient documentation

## 2014-01-03 LAB — CBC WITH DIFFERENTIAL/PLATELET
BASOS PCT: 0 % (ref 0–1)
Basophils Absolute: 0 10*3/uL (ref 0.0–0.1)
EOS PCT: 1 % (ref 0–5)
Eosinophils Absolute: 0.1 10*3/uL (ref 0.0–0.7)
HEMATOCRIT: 35.2 % — AB (ref 39.0–52.0)
HEMOGLOBIN: 11.2 g/dL — AB (ref 13.0–17.0)
Lymphocytes Relative: 28 % (ref 12–46)
Lymphs Abs: 2.7 10*3/uL (ref 0.7–4.0)
MCH: 28.9 pg (ref 26.0–34.0)
MCHC: 31.8 g/dL (ref 30.0–36.0)
MCV: 90.7 fL (ref 78.0–100.0)
MONO ABS: 0.8 10*3/uL (ref 0.1–1.0)
MONOS PCT: 8 % (ref 3–12)
NEUTROS ABS: 6.3 10*3/uL (ref 1.7–7.7)
Neutrophils Relative %: 63 % (ref 43–77)
Platelets: 446 10*3/uL — ABNORMAL HIGH (ref 150–400)
RBC: 3.88 MIL/uL — ABNORMAL LOW (ref 4.22–5.81)
RDW: 14 % (ref 11.5–15.5)
WBC: 9.8 10*3/uL (ref 4.0–10.5)

## 2014-01-03 LAB — COMPREHENSIVE METABOLIC PANEL
ALBUMIN: 4 g/dL (ref 3.5–5.2)
ALT: 12 U/L (ref 0–53)
AST: 19 U/L (ref 0–37)
Alkaline Phosphatase: 44 U/L (ref 39–117)
BUN: 10 mg/dL (ref 6–23)
CALCIUM: 9.4 mg/dL (ref 8.4–10.5)
CHLORIDE: 99 meq/L (ref 96–112)
CO2: 27 meq/L (ref 19–32)
CREATININE: 0.87 mg/dL (ref 0.50–1.35)
GFR calc Af Amer: >90 mL/min (ref >90)
Glucose, Bld: 144 mg/dL — ABNORMAL HIGH (ref 70–99)
Potassium: 4.4 mEq/L (ref 3.7–5.3)
Sodium: 140 mEq/L (ref 137–147)
Total Protein: 7.4 g/dL (ref 6.0–8.3)

## 2014-01-03 LAB — VITAMIN B12: Vitamin B-12: 317 pg/mL (ref 211–911)

## 2014-01-03 LAB — FOLATE: Folate: >20 ng/mL

## 2014-01-03 MED ORDER — METOCLOPRAMIDE HCL 5 MG PO TABS: ORAL_TABLET | ORAL | Status: DC

## 2014-01-03 NOTE — Patient Instructions (Addendum)
[facility] Discharge Instructions  RECOMMENDATIONS MADE BY THE CONSULTANT AND ANY TEST RESULTS WILL BE SENT TO YOUR REFERRING PHYSICIAN.  Return next Friday, Jan 10, 2014, for follow-up visit with the doctor and and iron infusion.  Get a nettie pot to help with nasal congestion.   Thank you for choosing [facility] to provide your oncology and hematology care.  To afford each patient quality time with our providers, please arrive at least 15 minutes before your scheduled appointment time.  With your help, our goal is to use those 15 minutes to complete the necessary work-up to ensure our physicians have the information they need to help with your evaluation and healthcare recommendations.    Effective January 1st, 2014, we ask that you re-schedule your appointment with our physicians should you arrive 10 or more minutes late for your appointment.  We strive to give you quality time with our providers, and arriving late affects you and other patients whose appointments are after yours.    Again, thank you for choosing [facility].  Our hope is that these requests will decrease the amount of time that you wait before being seen by our physicians.       _____________________________________________________________  Should you have questions after your visit to [facility], please contact our office at [phone] between the hours of 8:30 a.m. and 5:00 p.m.  Voicemails left after 4:30 p.m. will not be returned until the following business day.  For prescription refill requests, have your pharmacy contact our office with your prescription refill request.

## 2014-01-03 NOTE — Progress Notes (Signed)
[name], [name]  NEW PATIENT EVALUATION   Name: Brett [name] Date: 01/03/2014 MRN: [id number] DOB: [birth year]  PCP: [name], MD   REFERRING PHYSICIAN: [name], MD  REASON FOR REFERRAL: Anemia     HISTORY OF PRESENT ILLNESS:Brett [name] is a 63 y.o. male who is referred by family physician and gastroenterologist for evaluation of anemia. He was diagnosed in March with H. pylori gastritis and was treated with Pylera. His photon or fatigue and is no longer walking or exercising. He suffers with peripheral paresthesias and also intermittent nausea without vomiting but with belching. He denies any fever, night sweats, but does awaken at least 5 times a night for urination. He denies any lower extremity swelling or redness, but does have chronic cough with nasal drip and perennial rhinitis. He does not check his blood sugars at home. He denies any headache, diarrhea, constipation, and has had urinary frequency with dysuria. He denies any skin rash, but has had chronic back pain for which he uses a cane. He denies any headache or seizures.   PAST MEDICAL HISTORY:  has a past medical history of Diabetes mellitus; Depression; HTN (hypertension); Dextrocardia; and H. pylori infection (11/09/2012).     PAST SURGICAL HISTORY: Past Surgical History  Procedure Laterality Date  . Neck surgery      bone spurs  . Colonoscopy with esophagogastroduodenoscopy (egd) N/A 11/09/2012    Dr. 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 hpylori on bx- treated with pylera. TCS- normal rectum. 1 diminutive polyp in the mid descending segment. 1-50mm polyp in the mid desending segment o/w the remainder of the colonic mucosa appeared normal. tubular adenoma on bx  . Givens capsule study N/A 10/07/2013    Procedure: GIVENS CAPSULE STUDY;  Surgeon:  Daneil Dolin, MD;  Location: [facility];  Service: Endoscopy;  Laterality: N/A;  7:30     CURRENT MEDICATIONS: has a current medication list which includes the following prescription(s): dexlansoprazole, duloxetine, fenofibrate, ferrous fumarate, folic acid, gabapentin, insulin detemir, lorazepam, metformin, mometasone, multivitamin, pioglitazone, rosuvastatin, tramadol, albuterol, bismuth-metronidazole-tetracycline, hydrocodone-acetaminophen, hydrocodone-homatropine, and polyethylene glycol-electrolytes.   ALLERGIES: Review of patient's allergies indicates no known allergies.   SOCIAL HISTORY:  reports that he has never smoked. His smokeless tobacco use includes Chew. He reports that he does not drink alcohol.   FAMILY HISTORY: family history includes Arthritis in his sister; Asthma in his sister; Diabetes in his sister; Heart defect in his sister; Kidney disease in his sister; Pancreatitis in his sister.    REVIEW OF SYSTEMS:  Other than that discussed above is noncontributory.    PHYSICAL EXAM:  height is 5\' 10"  (1.778 m) and weight is 229 lb (103.874 kg). His oral temperature is 97.7 F (36.5 C). His blood pressure is 165/77 and his pulse is 79. His respiration is 20.    GENERAL:alert, no distress and comfortable. Moderately obese. SKIN: skin color, texture, turgor are normal, no rashes or significant lesions EYES: normal, Conjunctiva are pink and non-injected, sclera clear SINUSES: Bilateral rhinorrhea with no epistaxis. OROPHARYNX:no exudate, no erythema and lips, buccal mucosa, and tongue normal  NECK: supple, thyroid normal size, non-tender, without nodularity CHEST: Increased AP diameter with no gynecomastia. LYMPH:  no palpable lymphadenopathy in the cervical, axillary or inguinal LUNGS: clear to auscultation and percussion with normal breathing effort HEART: regular rate & rhythm and no  murmurs ABDOMEN:abdomen soft, non-tender and normal bowel sounds positive obese  with no appreciable organomegaly. MUSCULOSKELETALl:no cyanosis of digits, no clubbing or edema  NEURO: alert & oriented x 3 with fluent speech, no focal motor/sensory deficits    LABORATORY DATA:  No visits with results within 30 Day(s) from this visit. Latest known visit with results is:  Orders Only on 10/23/2013  Component Date Value Ref Range Status  . Iron 11/21/2013 103  42 - 165 ug/dL Final  . Ferritin 11/21/2013 33  22 - 322 ng/mL Final    Urinalysis No results found for this basename: colorurine,  appearanceur,  labspec,  phurine,  glucoseu,  hgbur,  bilirubinur,  ketonesur,  proteinur,  urobilinogen,  nitrite,  leukocytesur      @RADIOGRAPHY :    Patient Information    Patient Name Sex DOB SSN   Brett [name], Brett [name] Male 07-25-1951 [id number]            Procedures by Orvil Feil, NP at 10/07/2013 1:25 PM    Author: Orvil Feil, NP Service: Gastroenterology Author Type: Nurse Practitioner   Filed: 10/10/2013 3:49 PM Note Time: 10/07/2013 1:25 PM Status: Signed   Editor: Orvil Feil, NP (Nurse Practitioner) Cosigner: Daneil Dolin, MD at 10/11/2013 12:37 PM     Procedure Orders:    1. Procedural/ Surgical Case Request: GIVENS CAPSULE STUDY [[id number]] ordered by Daneil Dolin, MD at 09/16/13 1055         Pre-procedure Diagnoses    1. Anemia [285.9]         Procedures    1. SMALL BOWEL ENDOSCOPY [[id number]]        Small Bowel Givens Capsule Study  Procedure date: October 07, 2013  Referring Provider: Dr. Gala Romney  PCP: Dr. Everette Rank  Indication for procedure:  63 year old male with chronic anemia, Hgb staying around the 10 range, iron 85, ferritin 95, and heme positive stool, with EGD and colonoscopy on file. Found to have H.pylori gastritis and treated with Pylera in Feb 2014. Colonoscopy with tubular adenoma. Seen recently as an outpatient, December 2014, with nausea, GERD, and constipation. CBC rechecked and found to have Hgb staying around the 10 range. To conclude  GI work-up, capsule study requested and completed.  Findings:  Capsule study was complete. Although no gastric emptying study on file, question delayed gastric emptying in the setting of diabetes, as the capsule was retained in the stomach for over an hour. Prep was fair, with some debris throughout small bowel that obscured view. However, no AVMs, ulcerations, mass, or overt source for GI bleeding noted.  First Gastric image: 00:00:46  First Duodenal image: 01:30:32  First Cecal image: 2:57:09  Gastric Passage time: 1h 17m  Small Bowel Passage time: 1h 56m  Summary & Recommendations:  Anemia likely of chronic disease; iron levels normal in February 2014. No significant findings in small bowel to account for anemia or heme positive stool. Two questionable images to be reviewed in further detail with attending physician. Would recommend updated iron and ferritin, tight control of GERD symptoms, weight loss, and possible gastric emptying studying if nausea persists. Otherwise, anemia work-up complete from a GI standpoint.  Orvil Feil, ANP-BC  [facility]      PATHOLOGY: Peripheral smear reveals normocytic normochromic red cells.   IMPRESSION:  #1. 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 #2. Insulin requiring diabetes mellitus, type II, poorly controlled, with peripheral neuropathy #3. Allergic rhinitis, symptomatic. #4. Gastroparesis. #5. Gastroesophageal reflux disease, on  long-term proton pump inhibitor therapy #6. Depression.   PLAN:  #1. In the presence of his wife, the patient was reassured. #2. To circumvent poor iron absorption, the patient will be given intravenous Feraheme on 01/10/2014 at which time repeat visit will also be performed. #3. Additional testing was done today including antiparietal cell antibody and anti-intrinsic factor antibody along with workup for hemolysis, relative erythropoietin deficiency, and plasma cell disorder. #4. He was  advised to purchase a Nettie pot to use at the end of each day followed by nasal steroid inhalation. #5. Followup in one week for office visit and IV Feraheme.  I appreciate the opportunity of sharing in his care.   Farrel Gobble, MD 01/03/2014 3:32 PM   DISCLAIMER:  This note was dictated with voice recognition softwre.  Similar sounding words can inadvertently be transcribed inaccurately and may not be corrected upon review.

## 2014-01-06 ENCOUNTER — Telehealth (HOSPITAL_COMMUNITY): Payer: Self-pay | Admitting: Hematology and Oncology

## 2014-01-06 LAB — ANTI-PARIETAL ANTIBODY: PARIETAL CELL ANTIBODY-IGG: NEGATIVE

## 2014-01-06 LAB — INTRINSIC FACTOR ANTIBODIES: INTRINSIC FACTOR: NEGATIVE

## 2014-01-06 LAB — ERYTHROPOIETIN: ERYTHROPOIETIN: 19 m[IU]/mL — AB (ref 2.6–18.5)

## 2014-01-06 NOTE — Telephone Encounter (Signed)
PC TO [employer] [phone]  CSR JANE ?ED IF [facility] [phone] REQUIRED AUTH PER JANE AUTH NOT REQUIRED [id number]

## 2014-01-07 LAB — PROTEIN ELECTROPHORESIS, SERUM
ALPHA-2-GLOBULIN: 14.3 % — AB (ref 7.1–11.8)
Albumin ELP: 57.5 % (ref 55.8–66.1)
Alpha-1-Globulin: 5 % — ABNORMAL HIGH (ref 2.9–4.9)
BETA GLOBULIN: 8.8 % — AB (ref 4.7–7.2)
Beta 2: 3.7 % (ref 3.2–6.5)
GAMMA GLOBULIN: 10.7 % — AB (ref 11.1–18.8)
M-SPIKE, %: NOT DETECTED g/dL
Total Protein ELP: 6.2 g/dL (ref 6.0–8.3)

## 2014-01-09 ENCOUNTER — Encounter (HOSPITAL_COMMUNITY): Payer: Self-pay | Admitting: Oncology

## 2014-01-09 NOTE — Progress Notes (Signed)
Brett Hampshire, MD [place] [id number]  Iron deficiency anemia, unspecified - Plan: CBC with Differential, Iron and TIBC, Ferritin, Urinalysis, Routine w reflex microscopic, Urinalysis, Routine w reflex microscopic  Malabsorption of iron - Plan: CBC with Differential, Iron and TIBC, Ferritin, Urinalysis, Routine w reflex microscopic, Urinalysis, Routine w reflex microscopic  Fatigue  Burning with urination  Insomnia  CURRENT THERAPY: IV Feraheme 1020 mg today (01/10/2014) and PO Ferrous sulfate.  Recommend stopping PO iron.  INTERVAL HISTORY: Brett [name] 63 y.o. 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  I personally reviewed and went over laboratory results with the patient.  The results are noted within this dictation.  Iron deficiency anemia is the most common anemia.  Beside playing a critical role as an oxygen carrier in the heme group of hemoglobin, iron is found in many key proteins in the cells, such as cytochromes and myoglobin, so it is not unexpected that a lack of iron has effects other than anemia.  Three studies have focused on nonanemic iron deficiency leading to fatigue.  Two studies showed that oral iron supplementation reduces fatigue, with no significant change in hemoglobin levels, in women with a ferritin level of less than 50 ng/mL, and a third study showed a lessening of fatigue with parental iron administration in women with a ferritin level of 15 ng/mL or less or an iron saturation of 20% or less.   Owing to obligate iron loss through menses, women are at greater risk for iron deficiency than men.  Iron loss in all women averages 1-3 ng per day, and dietary intake is often inadequate to maintain a positive iron balance.  A 1967 study showed that 25% of healthy, college-age women had no bone marrow iron stores and that another 33% had low stores.  Pregnancy adds to demands for  iron, with requirements increasing to 6 ng per day by the end of pregnancy.  Athletes are another group at risk for iron deficiency.  Gastrointestinal tract blood is the source of iron loss, and exercise-induced hemolysis leads to urinary iron losses.  Decreased absorption of iron has also been implicated as a cause of iron deficiency, because of levels of hepcidin are often elevated in athletes owing to training-induced inflammation.    Obesity and its surgical treatment are also at risk factors for iron deficiency.  Obese patients are often iron-deficient, with increased hepcidin level being implicated in decreased absorption.  After bariatric surgery, the incidence of iron deficiency can be as high as 50%.  Because the main site of iron absorption is the duodenum, surgeries that involve bypassing this part of the bowel are associated with an increased incidence of iron deficiency.  However, iron deficiency is seen as a sequela of most types of bariatric surgery.    -NEJM Volume 371, No 14, pg 1325-1326  We discussed the risks, benefits, alteratives, and side effects of Feraheme including anaphylaxis leading to death which is rare.   He has a few complaints which I addressed: 1. Fatigue and tiredness- could be from iron deficiency.  I explained that it may not be from iron deficiency, but we will correct his iron.  If his fatigue persists, I recommend he follow-up with Dr. Everette [name].   2. Insomnia- I recommended PO Benadryl 1-2 tabs at HS.  If ineffective, I recommend he follow-up with PCP. 3. Dizziness- it may be from low iron so  we will see how he responds. 4. Burning with urination x 1 month- I will check a UA today.  Since his is on Dexilant, I recommended that he stop his oral ferrous sulfate because this will greatly hinder absorption of iron.    Hematologically, he otherwise denies any complaints and ROS questioning is negative.   Past Medical History  Diagnosis Date  . Diabetes mellitus    . Depression   . HTN (hypertension)     Cholesterol  . Dextrocardia   . H. pylori infection 11/09/2012    treated with pylera  . Iron deficiency anemia, unspecified 10/18/2012    has SPINAL STENOSIS; TENDINITIS, CALCIFIC, SHOULDER, RIGHT; IMPINGEMENT SYNDROME; HIP PAIN; GERD (gastroesophageal reflux disease); Nausea; Iron deficiency anemia, unspecified; Gastritis, Helicobacter pylori; Insulin-requiring or dependent type II diabetes mellitus; and Malabsorption of iron on his problem list.     has No Known Allergies.  Brett [name] does not currently have medications on file.  Past Surgical History  Procedure Laterality Date  . Neck surgery      bone spurs  . Colonoscopy with esophagogastroduodenoscopy (egd) N/A 11/09/2012    Dr. 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 hpylori on bx- treated with pylera. TCS- normal rectum. 1 diminutive polyp in the mid descending segment. 1-29mm polyp in the mid desending segment o/w the remainder of the colonic mucosa appeared normal. tubular adenoma on bx  . Givens capsule study N/A 10/07/2013    Procedure: GIVENS CAPSULE STUDY;  Surgeon: Brett Dolin, MD;  Location: [facility];  Service: Endoscopy;  Laterality: N/A;  7:30    Denies any headaches, double vision, fevers, chills, night sweats, nausea, vomiting, diarrhea, constipation, chest pain, heart palpitations, shortness of breath, blood in stool, urinary pain, urinary burning, urinary frequency, hematuria.   PHYSICAL EXAMINATION  ECOG PERFORMANCE STATUS: 1 - Symptomatic but completely ambulatory  Filed Vitals:   01/10/14 1000  BP: 141/101  Pulse: 82  Temp: 97.4 F (36.3 C)  Resp: 18    GENERAL:alert, no distress, well nourished, well developed, comfortable, cooperative, obese and smiling SKIN: skin color, texture, turgor are normal, no rashes or significant lesions HEAD: Normocephalic, No masses,  lesions, tenderness or abnormalities EYES: normal, PERRLA, EOMI, Conjunctiva are pink and non-injected EARS: External ears normal OROPHARYNX:mucous membranes are moist  NECK: supple, trachea midline LYMPH:  not examined BREAST:not examined LUNGS: not examined HEART: not examined ABDOMEN:obese BACK: Back symmetric, no curvature. EXTREMITIES:less then 2 second capillary refill, no joint deformities, effusion, or inflammation, no skin discoloration  NEURO: alert & oriented x 3 with fluent speech, no focal motor/sensory deficits, gait normal   LABORATORY DATA: CBC    Component Value Date/Time   WBC 9.8 01/03/2014 1600   RBC 3.88* 01/03/2014 1600   HGB 11.2* 01/03/2014 1600   HCT 35.2* 01/03/2014 1600   PLT 446* 01/03/2014 1600   MCV 90.7 01/03/2014 1600   MCH 28.9 01/03/2014 1600   MCHC 31.8 01/03/2014 1600   RDW 14.0 01/03/2014 1600   LYMPHSABS 2.7 01/03/2014 1600   MONOABS 0.8 01/03/2014 1600   EOSABS 0.1 01/03/2014 1600   BASOSABS 0.0 01/03/2014 1600      Chemistry      Component Value Date/Time   NA 140 01/03/2014 1600   K 4.4 01/03/2014 1600   CL 99 01/03/2014 1600   CO2 27 01/03/2014 1600   BUN 10 01/03/2014 1600   CREATININE 0.87 01/03/2014 1600   CREATININE 0.93  08/30/2013 1059   GLU 121 09/18/2012      Component Value Date/Time   CALCIUM 9.4 01/03/2014 1600   ALKPHOS 44 01/03/2014 1600   ALKPHOS 42 09/18/2012   AST 19 01/03/2014 1600   AST 17 09/18/2012   ALT 12 01/03/2014 1600   BILITOT <0.2* 01/03/2014 1600   BILITOT 0.3 09/18/2012     Lab Results  Component Value Date   IRON 103 11/21/2013   TIBC 500* 10/18/2012   FERRITIN 33 11/21/2013    Results for MISHAWN, HEMANN (MRN [id number]) as of 01/09/2014 17:55  Ref. Range 01/03/2014 16:00  Intrinsic Factor Latest Range: Negative  Negative   Results for KEHINDE, BOWDISH (MRN [id number]) as of 01/09/2014 17:55  Ref. Range 01/03/2014 15:30  Parietal Cell Antibody-IgG Latest Range: Negative  Negative   Results for QAIS, JOWERS (MRN  [id number]) as of 01/09/2014 17:55  Ref. Range 01/03/2014 16:01  Total Protein ELP Latest Range: 6.0-8.3 g/dL 6.2  Albumin ELP Latest Range: 55.8-66.1 % 57.5  Alpha-1-Globulin Latest Range: 2.9-4.9 % 5.0 (H)  Alpha-2-Globulin Latest Range: 7.1-11.8 % 14.3 (H)  Beta Globulin Latest Range: 4.7-7.2 % 8.8 (H)  Beta 2 Latest Range: 3.2-6.5 % 3.7  Gamma Globulin Latest Range: 11.1-18.8 % 10.7 (L)  M-SPIKE, % No range found NOT DETECTED  SPE Interp. No range found (NOTE)  Comment No range found (NOTE)       ASSESSMENT:  1. Iron deficiency anemia, secondary to malabsorption. 2. GERD with long-term use of PPI therapy. 3. Fatigue, tiredness possibly from iron deficiency 4. Insomnia 5. Dizziness, possibly from iron deficiency 6. Burning with urination x 1 month, will check UA today  Patient Active Problem List   Diagnosis Date Noted  . Gastritis, Helicobacter pylori 64/33/2951  . Insulin-requiring or dependent type II diabetes mellitus 01/03/2014  . 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 GERD (gastroesophageal reflux disease) 10/18/2012  . Nausea 10/18/2012  . Iron deficiency anemia, unspecified 10/18/2012  . HIP PAIN 08/31/2010  . SPINAL STENOSIS 06/28/2010  . TENDINITIS, CALCIFIC, SHOULDER, RIGHT 06/14/2010  . IMPINGEMENT SYNDROME 06/14/2010      PLAN:  1. I personally reviewed and went over laboratory results with the patient.  The results are noted within this dictation. 2. Discussion regarding IDA education 3. Risks, benefits, alternatives, and side effects of Feraheme discussed. 4. Labs in 6 weeks and 12 weeks: CBC diff, iron/TIBC, Ferritin 5. UA today 6. Recommend D/C ferrous sulfate 7. Recommend 25-50 mg Benadryl at HS for sleep. 8. Follow-up with PCP as directed 9. Return in 3 months for follow-up   THERAPY PLAN:  Taiven will be given IV Feraheme for the first time today.  We will monitor his response to therapy.   All questions were answered. The patient knows to call  the clinic with any problems, questions or concerns. We can certainly see the patient much sooner if necessary.  Patient and plan discussed with Dr. Farrel Gobble and he is in agreement with the aforementioned.    [name] 01/10/2014

## 2014-01-10 ENCOUNTER — Encounter (HOSPITAL_BASED_OUTPATIENT_CLINIC_OR_DEPARTMENT_OTHER): Payer: Managed Care, Other (non HMO) | Admitting: Oncology

## 2014-01-10 ENCOUNTER — Encounter (HOSPITAL_COMMUNITY): Payer: Managed Care, Other (non HMO) | Attending: Hematology and Oncology

## 2014-01-10 ENCOUNTER — Encounter (HOSPITAL_COMMUNITY): Payer: Self-pay | Admitting: Oncology

## 2014-01-10 VITALS — BP 141/101 | HR 82 | Temp 97.4°F | Resp 18 | Wt 245.2 lb

## 2014-01-10 VITALS — BP 155/62 | HR 81

## 2014-01-10 DIAGNOSIS — R5383 Other fatigue: Secondary | ICD-10-CM

## 2014-01-10 DIAGNOSIS — K909 Intestinal malabsorption, unspecified: Secondary | ICD-10-CM

## 2014-01-10 DIAGNOSIS — D509 Iron deficiency anemia, unspecified: Secondary | ICD-10-CM | POA: Insufficient documentation

## 2014-01-10 DIAGNOSIS — G4700 Insomnia, unspecified: Secondary | ICD-10-CM

## 2014-01-10 DIAGNOSIS — K9089 Other intestinal malabsorption: Secondary | ICD-10-CM | POA: Insufficient documentation

## 2014-01-10 DIAGNOSIS — D649 Anemia, unspecified: Secondary | ICD-10-CM

## 2014-01-10 DIAGNOSIS — R5381 Other malaise: Secondary | ICD-10-CM

## 2014-01-10 DIAGNOSIS — R300 Dysuria: Secondary | ICD-10-CM

## 2014-01-10 LAB — URINALYSIS, ROUTINE W REFLEX MICROSCOPIC
Bilirubin Urine: NEGATIVE
Glucose, UA: 100 mg/dL — AB
HGB URINE DIPSTICK: NEGATIVE
KETONES UR: NEGATIVE mg/dL
Leukocytes, UA: NEGATIVE
Nitrite: NEGATIVE
PH: 5.5 (ref 5.0–8.0)
PROTEIN: NEGATIVE mg/dL
Specific Gravity, Urine: 1.015 (ref 1.005–1.030)
Urobilinogen, UA: 0.2 mg/dL (ref 0.0–1.0)

## 2014-01-10 MED ORDER — SODIUM CHLORIDE 0.9 % IV SOLN
1020.0000 mg | Freq: Once | INTRAVENOUS | Status: AC
  Administered 2014-01-10: 1020 mg via INTRAVENOUS
  Filled 2014-01-10: qty 34

## 2014-01-10 MED ORDER — SODIUM CHLORIDE 0.9 % IV SOLN
Freq: Once | INTRAVENOUS | Status: AC
  Administered 2014-01-10: 11:00:00 via INTRAVENOUS

## 2014-01-10 MED ORDER — SODIUM CHLORIDE 0.9 % IJ SOLN: 10.0000 mL | INTRAMUSCULAR | Status: DC | PRN

## 2014-01-10 NOTE — Patient Instructions (Addendum)
[place] Discharge Instructions  RECOMMENDATIONS MADE BY THE CONSULTANT AND ANY TEST RESULTS WILL BE SENT TO YOUR REFERRING Physician We will see you in 6 weeks for lab work, then again at 15 weeks.  You will see the doctor at 12 weeks. Benadryl 25-50 mg at night as needed to help you sleep.  If this does not help, please follow up with your primary care doctor.   Thank you for choosing [facility] to provide your oncology and hematology care.  To afford each patient quality time with our providers, please arrive at least 15 minutes before your scheduled appointment time.  With your help, our goal is to use those 15 minutes to complete the necessary work-up to ensure our physicians have the information they need to help with your evaluation and healthcare recommendations.    Effective January 1st, 2014, we ask that you re-schedule your appointment with our physicians should you arrive 10 or more minutes late for your appointment.  We strive to give you quality time with our providers, and arriving late affects you and other patients whose appointments are after yours.    Again, thank you for choosing [facility].  Our hope is that these requests will decrease the amount of time that you wait before being seen by our physicians.       _____________________________________________________________  Should you have questions after your visit to [facility], please contact our office at [phone] between the hours of 8:30 a.m. and 5:00 p.m.  Voicemails left after 4:30 p.m. will not be returned until the following business day.  For prescription refill requests, have your pharmacy contact our office with your prescription refill request.

## 2014-01-10 NOTE — Progress Notes (Signed)
Tolerated well

## 2014-01-14 ENCOUNTER — Telehealth (HOSPITAL_COMMUNITY): Payer: Self-pay

## 2014-02-21 ENCOUNTER — Encounter (HOSPITAL_COMMUNITY): Payer: Managed Care, Other (non HMO) | Attending: Hematology and Oncology

## 2014-02-21 DIAGNOSIS — D509 Iron deficiency anemia, unspecified: Secondary | ICD-10-CM | POA: Insufficient documentation

## 2014-02-21 DIAGNOSIS — K9089 Other intestinal malabsorption: Secondary | ICD-10-CM | POA: Insufficient documentation

## 2014-02-21 DIAGNOSIS — K909 Intestinal malabsorption, unspecified: Secondary | ICD-10-CM

## 2014-02-21 LAB — CBC WITH DIFFERENTIAL/PLATELET
BASOS ABS: 0 10*3/uL (ref 0.0–0.1)
Basophils Relative: 0 % (ref 0–1)
Eosinophils Absolute: 0.1 10*3/uL (ref 0.0–0.7)
Eosinophils Relative: 1 % (ref 0–5)
HCT: 39.5 % (ref 39.0–52.0)
Hemoglobin: 13 g/dL (ref 13.0–17.0)
LYMPHS PCT: 28 % (ref 12–46)
Lymphs Abs: 2.5 10*3/uL (ref 0.7–4.0)
MCH: 30 pg (ref 26.0–34.0)
MCHC: 32.9 g/dL (ref 30.0–36.0)
MCV: 91.2 fL (ref 78.0–100.0)
MONO ABS: 0.6 10*3/uL (ref 0.1–1.0)
Monocytes Relative: 7 % (ref 3–12)
Neutro Abs: 5.8 10*3/uL (ref 1.7–7.7)
Neutrophils Relative %: 64 % (ref 43–77)
Platelets: 405 10*3/uL — ABNORMAL HIGH (ref 150–400)
RBC: 4.33 MIL/uL (ref 4.22–5.81)
RDW: 13.6 % (ref 11.5–15.5)
WBC: 9 10*3/uL (ref 4.0–10.5)

## 2014-02-21 LAB — IRON AND TIBC
Iron: 116 ug/dL (ref 42–135)
SATURATION RATIOS: 26 % (ref 20–55)
TIBC: 453 ug/dL — ABNORMAL HIGH (ref 215–435)
UIBC: 337 ug/dL (ref 125–400)

## 2014-02-21 LAB — FERRITIN: Ferritin: 240 ng/mL (ref 22–322)

## 2014-02-21 NOTE — Progress Notes (Signed)
Lab draw

## 2014-02-28 ENCOUNTER — Other Ambulatory Visit (HOSPITAL_COMMUNITY): Payer: Self-pay | Admitting: Oncology

## 2014-02-28 DIAGNOSIS — K909 Intestinal malabsorption, unspecified: Secondary | ICD-10-CM

## 2014-02-28 DIAGNOSIS — D509 Iron deficiency anemia, unspecified: Secondary | ICD-10-CM

## 2014-03-29 NOTE — Progress Notes (Signed)
[name], MD [place] [address]  Iron deficiency anemia, unspecified - Plan: CBC with Differential, Iron and TIBC, Ferritin  Malabsorption of iron - Plan: CBC with Differential, Iron and TIBC, Ferritin  CURRENT THERAPY: Intermittent IV Feraheme  INTERVAL HISTORY: ORIE BAXENDALE 63 y.o. 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  I personally reviewed and went over laboratory results with the patient.  The results are noted within this dictation.  His Hgb is WNL in June following his IV Feraheme infusion in May.  He denies any blood in stool, black tarry, and hematuria.  He denies any epistaxis and hemoptysis.  Overall, he reports that he feels much better than he did prior to his IV Feraheme in May.  Hematologically, he denies any complaints  and ROS questioning is negative.  Past Medical History  Diagnosis Date  . Diabetes mellitus   . Depression   . HTN (hypertension)     Cholesterol  . Dextrocardia   . H. pylori infection 11/09/2012    treated with pylera  . Iron deficiency anemia, unspecified 10/18/2012    has SPINAL STENOSIS; TENDINITIS, CALCIFIC, SHOULDER, RIGHT; IMPINGEMENT SYNDROME; HIP PAIN; GERD (gastroesophageal reflux disease); Nausea; Iron deficiency anemia, unspecified; Gastritis, Helicobacter pylori; Insulin-requiring or dependent type II diabetes mellitus; and Malabsorption of iron on his problem list.     has No Known Allergies.  Mr. Guerrero had no medications administered during this visit.  Past Surgical History  Procedure Laterality Date  . Neck surgery      bone spurs  . Colonoscopy with esophagogastroduodenoscopy (egd) N/A 11/09/2012    Dr. 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 hpylori on bx- treated with pylera. TCS- normal rectum. 1 diminutive polyp in the mid  descending segment. 1-32mm polyp in the mid desending segment o/w the remainder of the colonic mucosa appeared normal. tubular adenoma on bx  . Givens capsule study N/A 10/07/2013    Procedure: GIVENS CAPSULE STUDY;  Surgeon: Daneil Dolin, MD;  Location: [facility];  Service: Endoscopy;  Laterality: N/A;  7:30    Denies any headaches, dizziness, double vision, fevers, chills, night sweats, nausea, vomiting, diarrhea, constipation, chest pain, heart palpitations, shortness of breath, blood in stool, black tarry stool, urinary pain, urinary burning, urinary frequency, hematuria.   PHYSICAL EXAMINATION  ECOG PERFORMANCE STATUS: 0 - Asymptomatic  Filed Vitals:   04/04/14 1400  BP: 155/78  Pulse: 98  Temp: 98.3 F (36.8 C)  Resp: 20    GENERAL:alert, no distress, well nourished, well developed, comfortable, cooperative, obese and smiling SKIN: skin color, texture, turgor are normal, no rashes or significant lesions HEAD: Normocephalic, No masses, lesions, tenderness or abnormalities EYES: normal, PERRLA, EOMI, Conjunctiva are pink and non-injected EARS: External ears normal OROPHARYNX:mucous membranes are moist  NECK: supple, no adenopathy, thyroid normal size, non-tender, without nodularity, no stridor, non-tender, trachea midline LYMPH:  no palpable lymphadenopathy BREAST:not examined LUNGS: clear to auscultation  HEART: regular rate & rhythm, no murmurs and no gallops ABDOMEN:abdomen soft, obese and normal bowel sounds BACK: Back symmetric, no curvature., No CVA tenderness EXTREMITIES:less then 2 second capillary refill, no joint deformities, effusion, or inflammation, no edema, no skin discoloration, no clubbing, no cyanosis  NEURO: alert & oriented x 3 with fluent speech, no focal motor/sensory deficits, gait normal   LABORATORY DATA: CBC    Component Value  Date/Time   WBC 9.0 02/21/2014 0941   RBC 4.33 02/21/2014 0941   HGB 13.0 02/21/2014 0941   HCT 39.5 02/21/2014 0941     PLT 405* 02/21/2014 0941   MCV 91.2 02/21/2014 0941   MCH 30.0 02/21/2014 0941   MCHC 32.9 02/21/2014 0941   RDW 13.6 02/21/2014 0941   LYMPHSABS 2.5 02/21/2014 0941   MONOABS 0.6 02/21/2014 0941   EOSABS 0.1 02/21/2014 0941   BASOSABS 0.0 02/21/2014 0941      Chemistry      Component Value Date/Time   NA 140 01/03/2014 1600   K 4.4 01/03/2014 1600   CL 99 01/03/2014 1600   CO2 27 01/03/2014 1600   BUN 10 01/03/2014 1600   CREATININE 0.87 01/03/2014 1600   CREATININE 0.93 08/30/2013 1059   GLU 121 09/18/2012      Component Value Date/Time   CALCIUM 9.4 01/03/2014 1600   ALKPHOS 44 01/03/2014 1600   ALKPHOS 42 09/18/2012   AST 19 01/03/2014 1600   AST 17 09/18/2012   ALT 12 01/03/2014 1600   BILITOT <0.2* 01/03/2014 1600   BILITOT 0.3 09/18/2012     Lab Results  Component Value Date   IRON 116 02/21/2014   TIBC 453* 02/21/2014   FERRITIN 240 02/21/2014     ASSESSMENT:  1. Iron deficiency anemia, secondary to malabsorption.  2. GERD with long-term use of PPI therapy.  Patient Active Problem List   Diagnosis Date Noted  . Gastritis, Helicobacter pylori 17/00/1749  . Insulin-requiring or dependent type II diabetes mellitus 01/03/2014  . 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 GERD (gastroesophageal reflux disease) 10/18/2012  . Nausea 10/18/2012  . Iron deficiency anemia, unspecified 10/18/2012  . HIP PAIN 08/31/2010  . SPINAL STENOSIS 06/28/2010  . TENDINITIS, CALCIFIC, SHOULDER, RIGHT 06/14/2010  . IMPINGEMENT SYNDROME 06/14/2010     PLAN:  1. I personally reviewed and went over laboratory results with the patient.  The results are noted within this dictation. 2. Labs today: CBC diff, Iron/TIBC, Ferritin, Soluble transferrin. 3. Labs in 3 and 6 months: CBC diff, Iron/TIBC, Ferritin 4. Return in 6 months for follow-up   THERAPY PLAN:  We will monitor labs and iron studies and administer IV Feraheme as labs indicate.  All questions were answered. The patient knows to call the  clinic with any problems, questions or concerns. We can certainly see the patient much sooner if necessary.  Patient and plan discussed with Dr. Nelida Meuse and he is in agreement with the aforementioned.   Leeann Bady 04/04/2014

## 2014-04-04 ENCOUNTER — Encounter (HOSPITAL_COMMUNITY): Payer: Managed Care, Other (non HMO)

## 2014-04-04 ENCOUNTER — Encounter (HOSPITAL_COMMUNITY): Payer: Self-pay | Admitting: Oncology

## 2014-04-04 ENCOUNTER — Encounter (HOSPITAL_COMMUNITY): Payer: Managed Care, Other (non HMO) | Attending: Hematology and Oncology | Admitting: Oncology

## 2014-04-04 ENCOUNTER — Other Ambulatory Visit (HOSPITAL_COMMUNITY): Payer: Managed Care, Other (non HMO)

## 2014-04-04 VITALS — BP 155/78 | HR 98 | Temp 98.3°F | Resp 20 | Wt 241.2 lb

## 2014-04-04 DIAGNOSIS — K219 Gastro-esophageal reflux disease without esophagitis: Secondary | ICD-10-CM

## 2014-04-04 DIAGNOSIS — K909 Intestinal malabsorption, unspecified: Secondary | ICD-10-CM

## 2014-04-04 DIAGNOSIS — D500 Iron deficiency anemia secondary to blood loss (chronic): Secondary | ICD-10-CM

## 2014-04-04 DIAGNOSIS — K9089 Other intestinal malabsorption: Secondary | ICD-10-CM | POA: Insufficient documentation

## 2014-04-04 DIAGNOSIS — D509 Iron deficiency anemia, unspecified: Secondary | ICD-10-CM

## 2014-04-04 LAB — CBC WITH DIFFERENTIAL/PLATELET
Basophils Absolute: 0 10*3/uL (ref 0.0–0.1)
Basophils Relative: 0 % (ref 0–1)
Eosinophils Absolute: 0.1 10*3/uL (ref 0.0–0.7)
Eosinophils Relative: 1 % (ref 0–5)
HCT: 37.4 % — ABNORMAL LOW (ref 39.0–52.0)
Hemoglobin: 12.6 g/dL — ABNORMAL LOW (ref 13.0–17.0)
LYMPHS ABS: 3.1 10*3/uL (ref 0.7–4.0)
Lymphocytes Relative: 25 % (ref 12–46)
MCH: 30.5 pg (ref 26.0–34.0)
MCHC: 33.7 g/dL (ref 30.0–36.0)
MCV: 90.6 fL (ref 78.0–100.0)
MONOS PCT: 8 % (ref 3–12)
Monocytes Absolute: 0.9 10*3/uL (ref 0.1–1.0)
NEUTROS ABS: 8.1 10*3/uL — AB (ref 1.7–7.7)
NEUTROS PCT: 66 % (ref 43–77)
PLATELETS: 393 10*3/uL (ref 150–400)
RBC: 4.13 MIL/uL — ABNORMAL LOW (ref 4.22–5.81)
RDW: 12.8 % (ref 11.5–15.5)
WBC: 12.2 10*3/uL — ABNORMAL HIGH (ref 4.0–10.5)

## 2014-04-04 LAB — IRON AND TIBC
Iron: 128 ug/dL (ref 42–135)
Saturation Ratios: 28 % (ref 20–55)
TIBC: 463 ug/dL — AB (ref 215–435)
UIBC: 335 ug/dL (ref 125–400)

## 2014-04-04 LAB — FERRITIN: FERRITIN: 246 ng/mL (ref 22–322)

## 2014-04-04 NOTE — Progress Notes (Signed)
Brett [name] presented for labwork. Labs per MD order drawn via Peripheral Line 23 gauge needle inserted in left antecubital.  Good blood return present. Procedure without incident.  Needle removed intact. Patient tolerated procedure well.

## 2014-04-04 NOTE — Patient Instructions (Signed)
[facility] Discharge Instructions  RECOMMENDATIONS MADE BY THE CONSULTANT AND ANY TEST RESULTS WILL BE SENT TO YOUR REFERRING PHYSICIAN.  EXAM FINDINGS BY THE PHYSICIAN TODAY AND SIGNS OR SYMPTOMS TO REPORT TO CLINIC OR PRIMARY PHYSICIAN: Exam and findings as discussed by Kirby Crigler, PA.Marland Kitchen  MEDICATIONS PRESCRIBED:  Continue all as prescribed.  INSTRUCTIONS/FOLLOW-UP: Lab work again in 3 months and 6 months. MD appointment after 6 month labs. Report any issues/concerns as needed to clinic prior to appointment.  Thank you for choosing [facility] to provide your oncology and hematology care.  To afford each patient quality time with our providers, please arrive at least 15 minutes before your scheduled appointment time.  With your help, our goal is to use those 15 minutes to complete the necessary work-up to ensure our physicians have the information they need to help with your evaluation and healthcare recommendations.    Effective January 1st, 2014, we ask that you re-schedule your appointment with our physicians should you arrive 10 or more minutes late for your appointment.  We strive to give you quality time with our providers, and arriving late affects you and other patients whose appointments are after yours.    Again, thank you for choosing [facility].  Our hope is that these requests will decrease the amount of time that you wait before being seen by our physicians.       _____________________________________________________________  Should you have questions after your visit to [facility], please contact our office at [phone] between the hours of 8:30 a.m. and 4:30 p.m.  Voicemails left after 4:30 p.m. will not be returned until the following business day.  For prescription refill requests, have your pharmacy contact our office with your prescription refill request.     _______________________________________________________________  We hope that we have given you very good care.  You may receive a patient satisfaction survey in the mail, please complete it and return it as soon as possible.  We value your feedback!  _______________________________________________________________  Have you asked about our STAR program?  STAR stands for Survivorship Training and Rehabilitation, and this is a nationally recognized cancer care program that focuses on survivorship and rehabilitation.  Cancer and cancer treatments may cause problems, such as, pain, making you feel tired and keeping you from doing the things that you need or want to do. Cancer rehabilitation can help. Our goal is to reduce these troubling effects and help you have the best quality of life possible.  You may receive a survey from a nurse that asks questions about your current state of health.  Based on the survey results, all eligible patients will be referred to the [facility] program for an evaluation so we can better serve you!  A frequently asked questions sheet is available upon request.

## 2014-04-08 LAB — SOLUBLE TRANSFERRIN RECEPTOR: TRANSFERRIN RECEPTOR, SOLUBLE: 0.7 mg/L — AB (ref 0.76–1.76)

## 2014-04-09 ENCOUNTER — Other Ambulatory Visit (HOSPITAL_COMMUNITY): Payer: Self-pay | Admitting: Oncology

## 2014-04-09 DIAGNOSIS — R110 Nausea: Secondary | ICD-10-CM

## 2014-04-09 MED ORDER — METOCLOPRAMIDE HCL 5 MG PO TABS: ORAL_TABLET | ORAL | Status: DC

## 2014-04-19 ENCOUNTER — Emergency Department (HOSPITAL_COMMUNITY): Payer: Managed Care, Other (non HMO)

## 2014-04-19 ENCOUNTER — Encounter (HOSPITAL_COMMUNITY): Payer: Self-pay | Admitting: Emergency Medicine

## 2014-04-19 ENCOUNTER — Emergency Department (HOSPITAL_COMMUNITY)
Admission: EM | Admit: 2014-04-19 | Discharge: 2014-04-19 | Disposition: A | Discharge status: Home / Self Care | Payer: Managed Care, Other (non HMO) | Attending: Emergency Medicine | Admitting: Emergency Medicine

## 2014-04-19 DIAGNOSIS — E119 Type 2 diabetes mellitus without complications: Secondary | ICD-10-CM | POA: Insufficient documentation

## 2014-04-19 DIAGNOSIS — I10 Essential (primary) hypertension: Secondary | ICD-10-CM | POA: Insufficient documentation

## 2014-04-19 DIAGNOSIS — R109 Unspecified abdominal pain: Secondary | ICD-10-CM | POA: Insufficient documentation

## 2014-04-19 DIAGNOSIS — D509 Iron deficiency anemia, unspecified: Secondary | ICD-10-CM | POA: Insufficient documentation

## 2014-04-19 DIAGNOSIS — Q248 Other specified congenital malformations of heart: Secondary | ICD-10-CM | POA: Insufficient documentation

## 2014-04-19 DIAGNOSIS — Z8619 Personal history of other infectious and parasitic diseases: Secondary | ICD-10-CM | POA: Insufficient documentation

## 2014-04-19 DIAGNOSIS — Z79899 Other long term (current) drug therapy: Secondary | ICD-10-CM | POA: Insufficient documentation

## 2014-04-19 DIAGNOSIS — R1030 Lower abdominal pain, unspecified: Secondary | ICD-10-CM

## 2014-04-19 LAB — URINALYSIS, ROUTINE W REFLEX MICROSCOPIC
Bilirubin Urine: NEGATIVE
Glucose, UA: 250 mg/dL — AB
Hgb urine dipstick: NEGATIVE
Ketones, ur: NEGATIVE mg/dL
Leukocytes, UA: NEGATIVE
Nitrite: NEGATIVE
Protein, ur: NEGATIVE mg/dL
Specific Gravity, Urine: 1.015 (ref 1.005–1.030)
Urobilinogen, UA: 0.2 mg/dL (ref 0.0–1.0)
pH: 6 (ref 5.0–8.0)

## 2014-04-19 LAB — COMPREHENSIVE METABOLIC PANEL WITH GFR
ALT: 18 U/L (ref 0–53)
AST: 22 U/L (ref 0–37)
Albumin: 4.2 g/dL (ref 3.5–5.2)
Alkaline Phosphatase: 50 U/L (ref 39–117)
Anion gap: 15 (ref 5–15)
BUN: 10 mg/dL (ref 6–23)
CO2: 26 meq/L (ref 19–32)
Calcium: 10 mg/dL (ref 8.4–10.5)
Chloride: 100 meq/L (ref 96–112)
Creatinine, Ser: 0.73 mg/dL (ref 0.50–1.35)
GFR calc Af Amer: >90 mL/min
GFR calc non Af Amer: >90 mL/min
Glucose, Bld: 178 mg/dL — ABNORMAL HIGH (ref 70–99)
Potassium: 4.2 meq/L (ref 3.7–5.3)
Sodium: 141 meq/L (ref 137–147)
Total Bilirubin: 0.2 mg/dL — ABNORMAL LOW (ref 0.3–1.2)
Total Protein: 7.7 g/dL (ref 6.0–8.3)

## 2014-04-19 LAB — CBC WITH DIFFERENTIAL/PLATELET
BASOS ABS: 0 10*3/uL (ref 0.0–0.1)
BASOS PCT: 0 % (ref 0–1)
EOS ABS: 0.1 10*3/uL (ref 0.0–0.7)
Eosinophils Relative: 1 % (ref 0–5)
HCT: 37.7 % — ABNORMAL LOW (ref 39.0–52.0)
HEMOGLOBIN: 12.6 g/dL — AB (ref 13.0–17.0)
Lymphocytes Relative: 23 % (ref 12–46)
Lymphs Abs: 2.3 10*3/uL (ref 0.7–4.0)
MCH: 30.4 pg (ref 26.0–34.0)
MCHC: 33.4 g/dL (ref 30.0–36.0)
MCV: 90.8 fL (ref 78.0–100.0)
Monocytes Absolute: 0.7 10*3/uL (ref 0.1–1.0)
Monocytes Relative: 7 % (ref 3–12)
NEUTROS PCT: 69 % (ref 43–77)
Neutro Abs: 7.1 10*3/uL (ref 1.7–7.7)
PLATELETS: 357 10*3/uL (ref 150–400)
RBC: 4.15 MIL/uL — ABNORMAL LOW (ref 4.22–5.81)
RDW: 13.1 % (ref 11.5–15.5)
WBC: 10.2 10*3/uL (ref 4.0–10.5)

## 2014-04-19 LAB — CBG MONITORING, ED: Glucose-Capillary: 206 mg/dL — ABNORMAL HIGH (ref 70–99)

## 2014-04-19 LAB — LIPASE, BLOOD: LIPASE: 18 U/L (ref 11–59)

## 2014-04-19 MED ORDER — PROMETHAZINE HCL 25 MG PO TABS: 25.0000 mg | ORAL_TABLET | Freq: Four times a day (QID) | ORAL | Status: DC | PRN

## 2014-04-19 MED ORDER — SODIUM CHLORIDE 0.9 % IV BOLUS (SEPSIS)
1000.0000 mL | Freq: Once | INTRAVENOUS | Status: AC
  Administered 2014-04-19: 1000 mL via INTRAVENOUS

## 2014-04-19 MED ORDER — MORPHINE SULFATE 4 MG/ML IJ SOLN
4.0000 mg | Freq: Once | INTRAMUSCULAR | Status: AC
  Administered 2014-04-19: 4 mg via INTRAVENOUS
  Filled 2014-04-19: qty 1

## 2014-04-19 MED ORDER — IOHEXOL 300 MG/ML  SOLN
50.0000 mL | Freq: Once | INTRAMUSCULAR | Status: AC | PRN
  Administered 2014-04-19: 50 mL via ORAL

## 2014-04-19 MED ORDER — IOHEXOL 300 MG/ML  SOLN
100.0000 mL | Freq: Once | INTRAMUSCULAR | Status: AC | PRN
  Administered 2014-04-19: 100 mL via INTRAVENOUS

## 2014-04-19 MED ORDER — ONDANSETRON HCL 4 MG/2ML IJ SOLN
4.0000 mg | Freq: Once | INTRAMUSCULAR | Status: AC
  Administered 2014-04-19: 4 mg via INTRAVENOUS
  Filled 2014-04-19: qty 2

## 2014-04-19 MED ORDER — OXYCODONE-ACETAMINOPHEN 5-325 MG PO TABS: 1.0000 | ORAL_TABLET | ORAL | Status: DC | PRN

## 2014-04-19 NOTE — ED Notes (Signed)
Patient c/o mid-lower abd pain x1 week. Reports nausea but denies vomiting, diarrhea, or fevers. Patient does report "cold sweats," generalized fatigue, and "soreness" with urination. Patient reports having an "infection" in abd 1 year ago that he had to take antibiotics for-feels the same way.

## 2014-04-19 NOTE — ED Provider Notes (Signed)
CSN: [id number]     Arrival date & time 04/19/14  0950 History  This chart was scribed for [name], MD by Ludger Nutting, ED Scribe. This patient was seen in room APA19/APA19 and the patient's care was started 10:10 AM.    Chief Complaint  Patient presents with  . Abdominal Pain    The history is provided by the patient. No language interpreter was used.    HPI Comments: Brett [name] is a 63 y.o. male with past medical history of DM, HTN, H. Pylori infection who presents to the Emergency Department complaining of 6 days of intermittent episodes of bilateral lower abdominal pain that can last up to a day at a time. He describes the pain as soreness. He reports associated nausea, dysuria, and cold sweats that are worse in the morning. He denies vomiting, diarrhea, fever. He denies history of abdominal surgeries. Severity is mild to moderate. Nothing makes symptoms better or worse.   PCP [facility]  Past Medical History  Diagnosis Date  . Diabetes mellitus   . Depression   . HTN (hypertension)     Cholesterol  . Dextrocardia   . H. pylori infection 11/09/2012    treated with pylera  . Iron deficiency anemia, unspecified 10/18/2012   Past Surgical History  Procedure Laterality Date  . Neck surgery      bone spurs  . Colonoscopy with esophagogastroduodenoscopy (egd) N/A 11/09/2012    Dr. 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 hpylori on bx- treated with pylera. TCS- normal rectum. 1 diminutive polyp in the mid descending segment. 1-53mm polyp in the mid desending segment o/w the remainder of the colonic mucosa appeared normal. tubular adenoma on bx  . Givens capsule study N/A 10/07/2013    Procedure: GIVENS CAPSULE STUDY;  Surgeon: Daneil Dolin, MD;  Location: [facility];  Service: Endoscopy;  Laterality: N/A;  7:30   Family History  Problem Relation Age of Onset  . Diabetes Sister     Fx  .  Heart defect Sister     Fx  . Arthritis Sister     Fx  . Asthma Sister     Fx  . Kidney disease Sister     Fx  . Pancreatitis Sister    History  Substance Use Topics  . Smoking status: Never Smoker   . Smokeless tobacco: Current User    Types: Chew  . Alcohol Use: No    Review of Systems  A complete 10 system review of systems was obtained and all systems are negative except as noted in the HPI and PMH.    Allergies  Review of patient's allergies indicates no known allergies.  Home Medications   Prior to Admission medications   Medication Sig Start Date End Date Taking? Authorizing Provider  dexlansoprazole (DEXILANT) 60 MG capsule Take 1 capsule (60 mg total) by mouth daily. 09/13/13  Yes [name], PA-C  DULoxetine (CYMBALTA) 60 MG capsule Take 60 mg by mouth daily.   Yes Historical Provider, MD  fenofibrate (TRICOR) 145 MG tablet Take 145 mg by mouth daily.   Yes Historical Provider, MD  folic acid (FOLVITE) 1 MG tablet Take 1 mg by mouth daily.   Yes Historical Provider, MD  gabapentin (NEURONTIN) 100 MG capsule Take 100-200 mg by mouth 4 (four) times daily.    Yes Historical Provider, MD  insulin detemir (LEVEMIR) 100 UNIT/ML injection Inject 50 Units into the skin  at bedtime.    Yes Historical Provider, MD  LORazepam (ATIVAN) 1 MG tablet Take 1 mg by mouth every 4 (four) hours as needed. For nerves   Yes Historical Provider, MD  metFORMIN (GLUCOPHAGE) 1000 MG tablet Take 1,000 mg by mouth 2 (two) times daily with a meal.   Yes Historical Provider, MD  metoCLOPramide (REGLAN) 5 MG tablet Take 1 or 2 tablets 3 times a day before meals and at bedtime 04/09/14  Yes [name], PA-C  Multiple Vitamin (MULTIVITAMIN) tablet Take 1 tablet by mouth daily.   Yes Historical Provider, MD  pioglitazone (ACTOS) 45 MG tablet Take 45 mg by mouth daily.   Yes Historical Provider, MD  rosuvastatin (CRESTOR) 20 MG tablet Take 20 mg by mouth at bedtime.    Yes Historical Provider,  MD  traMADol (ULTRAM) 50 MG tablet Take 50-100 mg by mouth every 6 (six) hours as needed. For pain   Yes Historical Provider, MD  oxyCODONE-acetaminophen (PERCOCET) 5-325 MG per tablet Take 1-2 tablets by mouth every 4 (four) hours as needed. 04/19/14   [name], MD  promethazine (PHENERGAN) 25 MG tablet Take 1 tablet (25 mg total) by mouth every 6 (six) hours as needed. 04/19/14   [name], MD   BP 149/64  Pulse 76  Temp(Src) 97.9 F (36.6 C) (Oral)  Resp 18  Ht 5\' 7"  (1.702 m)  Wt 247 lb (112.038 kg)  BMI 38.68 kg/m2  SpO2 94% Physical Exam  Nursing note and vitals reviewed. Constitutional: He is oriented to person, place, and time. He appears well-developed and well-nourished.  HENT:  Head: Normocephalic and atraumatic.  Eyes: Conjunctivae and EOM are normal. Pupils are equal, round, and reactive to light.  Neck: Normal range of motion. Neck supple.  Cardiovascular: Normal rate, regular rhythm and normal heart sounds.   Pulmonary/Chest: Effort normal and breath sounds normal.  Abdominal: Soft. Bowel sounds are normal. There is tenderness in the right lower quadrant and left lower quadrant.  Minimal RLQ and LLQ tenderness   Musculoskeletal: Normal range of motion.  Neurological: He is alert and oriented to person, place, and time.  Skin: Skin is warm and dry.  Psychiatric: He has a normal mood and affect. His behavior is normal.    ED Course  Procedures (including critical care time)  DIAGNOSTIC STUDIES: Oxygen Saturation is 96% on RA, adequate by my interpretation.    COORDINATION OF CARE: 10:14 AM Discussed treatment plan with pt at bedside and pt agreed to plan.   Labs Review Labs Reviewed  COMPREHENSIVE METABOLIC PANEL - Abnormal; Notable for the following:    Glucose, Bld 178 (*)    Total Bilirubin 0.2 (*)    All other components within normal limits  CBC WITH DIFFERENTIAL - Abnormal; Notable for the following:    RBC 4.15 (*)    Hemoglobin 12.6 (*)    HCT 37.7  (*)    All other components within normal limits  URINALYSIS, ROUTINE W REFLEX MICROSCOPIC - Abnormal; Notable for the following:    Glucose, UA 250 (*)    All other components within normal limits  CBG MONITORING, ED - Abnormal; Notable for the following:    Glucose-Capillary 206 (*)    All other components within normal limits  LIPASE, BLOOD    Imaging Review Ct Abdomen Pelvis W Contrast  04/19/2014   CLINICAL DATA:  Mid abdominal pain and nausea.  EXAM: CT ABDOMEN AND PELVIS WITH CONTRAST  TECHNIQUE: Multidetector CT imaging of  the abdomen and pelvis was performed using the standard protocol following bolus administration of intravenous contrast.  CONTRAST:  57mL OMNIPAQUE IOHEXOL 300 MG/ML SOLN, 112mL OMNIPAQUE IOHEXOL 300 MG/ML SOLN  COMPARISON:  None.  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 The lung bases are clear. There is a area of rounded appearing atelectasis in the right lower lobe medially. This was also described on a CT report from 2003. The heart is normal in size. No pericardial effusion. The descending thoracic aorta is normal. The esophagus is unremarkable.  The liver is normal. No focal lesions or intrahepatic biliary dilatation. The gallbladder is normal. No common bowel duct dilatation. The pancreas demonstrates mild fatty change but no inflammation or mass. The spleen is normal in size. No focal lesions. The adrenal glands and kidneys are unremarkable.  The stomach, duodenum, small bowel and colon are unremarkable. No inflammatory changes, mass lesions or obstructive findings. The appendix is normal. The terminal ileum is normal. There are mild fibrofatty infiltrated type changes involving the ascending colon. This is typically seen with remote inflammatory bowel disease. No mesenteric or retroperitoneal mass or adenopathy. The aorta and branch vessels are normal. The major venous structures are patent.  The bladder demonstrates mild uniform wall thickening and a small urachal  remnant. The prostate gland and seminal vesicles are unremarkable. No pelvic mass or adenopathy. The rectum and sigmoid colon are unremarkable.  The bony structures are unremarkable.  IMPRESSION: No acute abdominal/ pelvic findings, mass lesions or adenopathy.  Fibrofatty infiltrated type changes involving the ascending colon suggesting remote or chronic inflammatory bowel disease. No obvious acute inflammation.  Situs inversis totalis.  Chronic area of possible rounded atelectasis in the right lower lobe.   Electronically Signed   By: Kalman Jewels [name]   On: 04/19/2014 12:07     EKG Interpretation None      MDM   Final diagnoses:  Lower abdominal pain    No acute abdomen. CT scan of abdomen pelvis shows no acute findings. Discharge medications Percocet and Phenergan 25 mg I personally performed the services described in this documentation, which was scribed in my presence. The recorded information has been reviewed and is accurate.   [name], MD 04/19/14 1349

## 2014-04-19 NOTE — Discharge Instructions (Signed)
CT scan showed no serious problems. Medication for pain and nausea. Followup your primary care Dr.

## 2014-04-19 NOTE — ED Notes (Signed)
Pt alert & oriented x4, stable gait. Patient given discharge instructions, paperwork & prescription(s). Patient  instructed to stop at the registration desk to finish any additional paperwork. Patient verbalized understanding. Pt left department w/ no further questions. 

## 2014-05-09 ENCOUNTER — Ambulatory Visit: Payer: Managed Care, Other (non HMO) | Admitting: Internal Medicine

## 2014-05-20 ENCOUNTER — Encounter: Payer: Self-pay | Admitting: Internal Medicine

## 2014-05-20 ENCOUNTER — Ambulatory Visit (INDEPENDENT_AMBULATORY_CARE_PROVIDER_SITE_OTHER): Payer: Managed Care, Other (non HMO) | Admitting: Internal Medicine

## 2014-05-20 ENCOUNTER — Other Ambulatory Visit: Payer: Self-pay | Admitting: Internal Medicine

## 2014-05-20 VITALS — BP 153/73 | HR 90 | Temp 98.2°F | Ht 68.0 in | Wt 242.4 lb

## 2014-05-20 DIAGNOSIS — R110 Nausea: Secondary | ICD-10-CM

## 2014-05-20 DIAGNOSIS — K3184 Gastroparesis: Secondary | ICD-10-CM

## 2014-05-20 DIAGNOSIS — K219 Gastro-esophageal reflux disease without esophagitis: Secondary | ICD-10-CM

## 2014-05-20 NOTE — Progress Notes (Signed)
Primary Care Physician:  [name], MD Primary Gastroenterologist:  Dr. Gala Romney  Pre-Procedure History & Physical: HPI:  Brett [name] is a 63 y.o. male here for evaluation of a vague abdominal pain and regurgitation and nausea. 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 Workup for Hemoccult-positive iron deficiency anemia in the past year included EGD colonoscopy and capsule study. Will be due for surveillance colonoscopy in the 4 years given colonic adenoma removed. Typical reflux symptoms well controlled on Dexilant 60 mg daily.  . Patient has no idea what his hemoglobin A1c is recently. Does not check blood sugars recently. He is significantly overweight.  Baseline bowel function is 1 good bowel movement on the order 3 times weekly. That has not changed. No vomiting, hematochezia or melena.  Treated for H. pylori recently.  Past Medical History  Diagnosis Date  . Diabetes mellitus   . Depression   . HTN (hypertension)     Cholesterol  . Dextrocardia   . H. pylori infection 11/09/2012    treated with pylera  . Iron deficiency anemia, unspecified 10/18/2012    Past Surgical History  Procedure Laterality Date  . Neck surgery      bone spurs  . Colonoscopy with esophagogastroduodenoscopy (egd) N/A 11/09/2012    Dr. 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 hpylori on bx- treated with pylera. TCS- normal rectum. 1 diminutive polyp in the mid descending segment. 1-77mm polyp in the mid desending segment o/w the remainder of the colonic mucosa appeared normal. tubular adenoma on bx  . Givens capsule study N/A 10/07/2013    no source for anemia or heme positive stool noted    Prior to Admission medications   Medication Sig Start Date End Date Taking? Authorizing Provider  dexlansoprazole (DEXILANT) 60 MG capsule Take 1 capsule (60 mg  total) by mouth daily. 09/13/13  Yes [name], PA-C  DULoxetine (CYMBALTA) 60 MG capsule Take 60 mg by mouth daily.   Yes Historical Provider, MD  fenofibrate (TRICOR) 145 MG tablet Take 145 mg by mouth daily.   Yes Historical Provider, MD  folic acid (FOLVITE) 1 MG tablet Take 1 mg by mouth daily.   Yes Historical Provider, MD  gabapentin (NEURONTIN) 100 MG capsule Take 100-200 mg by mouth 4 (four) times daily.    Yes Historical Provider, MD  insulin detemir (LEVEMIR) 100 UNIT/ML injection Inject 50 Units into the skin at bedtime.    Yes Historical Provider, MD  LORazepam (ATIVAN) 1 MG tablet Take 1 mg by mouth every 4 (four) hours as needed. For nerves   Yes Historical Provider, MD  metFORMIN (GLUCOPHAGE) 1000 MG tablet Take 1,000 mg by mouth 2 (two) times daily with a meal.   Yes Historical Provider, MD  oxyCODONE-acetaminophen (PERCOCET) 5-325 MG per tablet Take 1-2 tablets by mouth every 4 (four) hours as needed. 04/19/14  Yes [name], MD  pioglitazone (ACTOS) 45 MG tablet Take 45 mg by mouth daily.   Yes Historical Provider, MD  rosuvastatin (CRESTOR) 20 MG tablet Take 20 mg by mouth at bedtime.    Yes Historical Provider, MD  traMADol (ULTRAM) 50 MG tablet Take 50-100 mg by mouth every 6 (six) hours as needed. For pain   Yes Historical Provider, MD  metoCLOPramide (REGLAN) 5 MG tablet Take 1 or 2 tablets 3 times a day before meals and at bedtime 04/09/14  [name], PA-C  Multiple Vitamin (MULTIVITAMIN) tablet Take 1 tablet by mouth daily.    Historical Provider, MD  promethazine (PHENERGAN) 25 MG tablet Take 1 tablet (25 mg total) by mouth every 6 (six) hours as needed. 04/19/14   [name], MD    Allergies as of 05/20/2014  . (No Known Allergies)    Family History  Problem Relation Age of Onset  . Diabetes Sister     Fx  . Heart defect Sister     Fx  . Arthritis Sister     Fx  . Asthma Sister     Fx  . Kidney disease Sister     Fx  . Pancreatitis Sister      History   Social History  . Marital Status: Married    Spouse Name: N/A    Number of Children: 0  . 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 Smoking status: Never Smoker   . Smokeless tobacco: Current User    Types: Chew  . Alcohol Use: No  . Drug Use: No  . Sexual Activity: Not on file   Other Topics Concern  . Not on file   Social History Narrative  . No narrative on file    Review of Systems: See HPI, otherwise negative ROS  Physical Exam: BP 153/73  Pulse 90  Temp(Src) 98.2 F (36.8 C) (Oral)  Ht 5\' 8"  (1.727 m)  Wt 242 lb 6.4 oz (109.952 kg)  BMI 36.87 kg/m2 General:  Somewhat chronically ill, obese, pleasant and cooperative in NAD; accompanied by spouse Skin:  Intact without significant lesions or rashes. Eyes:  Sclera clear, no icterus.   Conjunctiva pink. Ears:  Normal auditory acuity. Nose:  No deformity, discharge,  or lesions. Mouth:  No deformity or lesions. Neck:  Supple; no masses or thyromegaly. No significant cervical adenopathy. Lungs:  Clear throughout to auscultation.   No wheezes, crackles, or rhonchi. No acute distress. Heart:  Regular rate and rhythm; no murmurs, clicks, rubs,  or gallops. Abdomen: Obese. Positive bowel sounds. Positive succussion splash. Soft and nontender without appreciable mass or organomegaly Pulses:  Normal pulses noted. Extremities:  Without clubbing or edema.  Impression:   Pleasant 63 year old obese diabetic male with GERD and situs inversus with vague symptoms of regurgitation/nausea the setting of very poorly controlled blood sugars. Patient has had a thorough evaluation for iron deficiency anemia recently as outlined above. Ferritin and CBCs have normalized. I suspect were dealing with underlying gastroparesis. He has a succussion splash today.  Recommendations:    Need better control of blood sugars; will have [name] send me hemoglobin A1c  results  Continue Dexilant 60 mg daily   Solid phase GES - evaluate for gastroparesis  Further recommendations to follow    Notice: This dictation was prepared with Dragon dictation along with smaller phrase technology. Any transcriptional errors that result from this process are unintentional and may not be corrected upon review.

## 2014-05-20 NOTE — Patient Instructions (Signed)
Need better control of blood sugars; have [name] send me hemoglobin A1c results  Continue Dexilant 60 mg daily   Solid phase GES - evaluate for gastroparesis  Further recommendations to follow

## 2014-05-22 ENCOUNTER — Ambulatory Visit (HOSPITAL_COMMUNITY)
Admission: RE | Admit: 2014-05-22 | Discharge: 2014-05-22 | Disposition: A | Discharge status: Home / Self Care | Payer: Managed Care, Other (non HMO) | Source: Ambulatory Visit | Attending: Internal Medicine | Admitting: Internal Medicine

## 2014-05-22 ENCOUNTER — Encounter (HOSPITAL_COMMUNITY): Payer: Self-pay

## 2014-05-22 DIAGNOSIS — K3184 Gastroparesis: Secondary | ICD-10-CM | POA: Diagnosis not present

## 2014-05-22 DIAGNOSIS — R109 Unspecified abdominal pain: Secondary | ICD-10-CM | POA: Diagnosis not present

## 2014-05-22 DIAGNOSIS — R110 Nausea: Secondary | ICD-10-CM | POA: Diagnosis not present

## 2014-05-22 MED ORDER — TECHNETIUM TC 99M SULFUR COLLOID
2.0000 | Freq: Once | INTRAVENOUS | Status: AC | PRN
  Administered 2014-05-22: 2 via ORAL

## 2014-05-29 NOTE — Progress Notes (Signed)
Patient  nic'd

## 2014-06-06 ENCOUNTER — Encounter (HOSPITAL_COMMUNITY): Payer: Self-pay | Admitting: Hematology and Oncology

## 2014-06-22 ENCOUNTER — Encounter (HOSPITAL_COMMUNITY): Payer: Self-pay | Admitting: Emergency Medicine

## 2014-06-22 ENCOUNTER — Emergency Department (HOSPITAL_COMMUNITY): Payer: Managed Care, Other (non HMO)

## 2014-06-22 ENCOUNTER — Emergency Department (HOSPITAL_COMMUNITY)
Admission: EM | Admit: 2014-06-22 | Discharge: 2014-06-22 | Disposition: A | Discharge status: Home / Self Care | Payer: Managed Care, Other (non HMO) | Attending: Emergency Medicine | Admitting: Emergency Medicine

## 2014-06-22 DIAGNOSIS — J209 Acute bronchitis, unspecified: Secondary | ICD-10-CM | POA: Diagnosis not present

## 2014-06-22 DIAGNOSIS — R61 Generalized hyperhidrosis: Secondary | ICD-10-CM | POA: Insufficient documentation

## 2014-06-22 DIAGNOSIS — F329 Major depressive disorder, single episode, unspecified: Secondary | ICD-10-CM | POA: Diagnosis not present

## 2014-06-22 DIAGNOSIS — I10 Essential (primary) hypertension: Secondary | ICD-10-CM | POA: Insufficient documentation

## 2014-06-22 DIAGNOSIS — R0789 Other chest pain: Secondary | ICD-10-CM | POA: Diagnosis not present

## 2014-06-22 DIAGNOSIS — D509 Iron deficiency anemia, unspecified: Secondary | ICD-10-CM | POA: Diagnosis not present

## 2014-06-22 DIAGNOSIS — Z8619 Personal history of other infectious and parasitic diseases: Secondary | ICD-10-CM | POA: Diagnosis not present

## 2014-06-22 DIAGNOSIS — Z794 Long term (current) use of insulin: Secondary | ICD-10-CM | POA: Diagnosis not present

## 2014-06-22 DIAGNOSIS — Z79899 Other long term (current) drug therapy: Secondary | ICD-10-CM | POA: Diagnosis not present

## 2014-06-22 DIAGNOSIS — E119 Type 2 diabetes mellitus without complications: Secondary | ICD-10-CM | POA: Insufficient documentation

## 2014-06-22 DIAGNOSIS — Q240 Dextrocardia: Secondary | ICD-10-CM | POA: Diagnosis not present

## 2014-06-22 DIAGNOSIS — R091 Pleurisy: Secondary | ICD-10-CM | POA: Diagnosis present

## 2014-06-22 LAB — CBC WITH DIFFERENTIAL/PLATELET
BASOS ABS: 0 10*3/uL (ref 0.0–0.1)
Basophils Relative: 0 % (ref 0–1)
Eosinophils Absolute: 0 10*3/uL (ref 0.0–0.7)
Eosinophils Relative: 0 % (ref 0–5)
HCT: 37.7 % — ABNORMAL LOW (ref 39.0–52.0)
Hemoglobin: 12.6 g/dL — ABNORMAL LOW (ref 13.0–17.0)
LYMPHS PCT: 17 % (ref 12–46)
Lymphs Abs: 2.1 10*3/uL (ref 0.7–4.0)
MCH: 30.3 pg (ref 26.0–34.0)
MCHC: 33.4 g/dL (ref 30.0–36.0)
MCV: 90.6 fL (ref 78.0–100.0)
Monocytes Absolute: 0.9 10*3/uL (ref 0.1–1.0)
Monocytes Relative: 7 % (ref 3–12)
NEUTROS ABS: 9.1 10*3/uL — AB (ref 1.7–7.7)
NEUTROS PCT: 76 % (ref 43–77)
PLATELETS: 475 10*3/uL — AB (ref 150–400)
RBC: 4.16 MIL/uL — ABNORMAL LOW (ref 4.22–5.81)
RDW: 12.7 % (ref 11.5–15.5)
WBC: 12.1 10*3/uL — AB (ref 4.0–10.5)

## 2014-06-22 LAB — BASIC METABOLIC PANEL
ANION GAP: 15 (ref 5–15)
BUN: 7 mg/dL (ref 6–23)
CALCIUM: 10.3 mg/dL (ref 8.4–10.5)
CHLORIDE: 101 meq/L (ref 96–112)
CO2: 25 meq/L (ref 19–32)
CREATININE: 0.77 mg/dL (ref 0.50–1.35)
GFR calc non Af Amer: >90 mL/min (ref >90)
Glucose, Bld: 166 mg/dL — ABNORMAL HIGH (ref 70–99)
Potassium: 4.3 mEq/L (ref 3.7–5.3)
SODIUM: 141 meq/L (ref 137–147)

## 2014-06-22 LAB — PRO B NATRIURETIC PEPTIDE: PRO B NATRI PEPTIDE: 79.7 pg/mL (ref 0–125)

## 2014-06-22 LAB — D-DIMER, QUANTITATIVE: D-Dimer, Quant: <0.27 ug/mL-FEU (ref 0.00–0.48)

## 2014-06-22 LAB — TROPONIN I: Troponin I: <0.3 ng/mL (ref <0.30)

## 2014-06-22 MED ORDER — AZITHROMYCIN 250 MG PO TABS
ORAL_TABLET | ORAL | Status: DC
Start: 2014-06-22 — End: 2014-10-08

## 2014-06-22 MED ORDER — HYDROCOD POLST-CHLORPHEN POLST 10-8 MG/5ML PO LQCR: 5.0000 mL | Freq: Two times a day (BID) | ORAL | Status: DC | PRN

## 2014-06-22 NOTE — ED Provider Notes (Signed)
CSN: [id number]     Arrival date & time 06/22/14  1734 History  This chart was scribed for Veryl Speak, MD by Edison Simon, ED Scribe. This patient was seen in room APA18/AP[facility]18 and the patient's care was started at 6:36 PM.    Chief Complaint  Patient presents with  . Pleurisy   Patient is a 63 y.o. male presenting with chest pain. The history is provided by the patient. No language interpreter was used.  Chest Pain Pain quality: tightness   Pain radiates to:  Does not radiate Pain radiates to the back: no   Pain severity:  Moderate Progression:  Worsening Chronicity:  Chronic Relieved by:  Nothing Worsened by:  Nothing tried Ineffective treatments: Tylenol, Dayquil. Associated symptoms: cough, diaphoresis and fever     HPI Comments: RYVER POBLETE is a 63 y.o. male who presents to the Emergency Department complaining of diaphoresis and chills with associated chest tightness, with onset 2 days ago. He also reports associated difficulty breathing, cough, and fever.He states his chest felt sore and tight last week but he reports chronic problems with breathing and coughing up phlegm. 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 He states he has never had a stress test or heart cath. He reports using Dayquil and Tylenol. He denies sick contacts. He denies smoking. He denies previous diagnoses of COPD or emphysema. He denies leg swelling.   Past Medical History  Diagnosis Date  . Diabetes mellitus   . Depression   . HTN (hypertension)     Cholesterol  . Dextrocardia   . H. pylori infection 11/09/2012    treated with pylera  . Iron deficiency anemia, unspecified 10/18/2012   Past Surgical History  Procedure Laterality Date  . Neck surgery      bone spurs  . Colonoscopy with esophagogastroduodenoscopy (egd) N/A 11/09/2012    Dr. Gala Romney- EGD-normal esophagus, reversed stomach c/w situs inversus (with dextrocardia query kartagener  syndrome.) gastric erosions. hpylori on bx- treated with pylera. TCS- normal rectum. 1 diminutive polyp in the mid descending segment. 1-51mm polyp in the mid desending segment o/w the remainder of the colonic mucosa appeared normal. tubular adenoma on bx  . Givens capsule study N/A 10/07/2013    no source for anemia or heme positive stool noted   Family History  Problem Relation Age of Onset  . Diabetes Sister     Fx  . Heart defect Sister     Fx  . Arthritis Sister     Fx  . Asthma Sister     Fx  . Kidney disease Sister     Fx  . Pancreatitis Sister    History  Substance Use Topics  . Smoking status: Never Smoker   . Smokeless tobacco: Current User    Types: Chew  . Alcohol Use: No    Review of Systems  Constitutional: Positive for fever and diaphoresis.  Respiratory: Positive for cough.   Cardiovascular: Positive for chest pain.  All other systems reviewed and are negative. A complete 10 system review of systems was obtained and all systems are negative except as noted in the HPI and PMH.    Allergies  Review of patient's allergies indicates no known allergies.  Home Medications   Prior to Admission medications   Medication Sig Start Date End Date Taking? Authorizing Provider  dexlansoprazole (DEXILANT) 60 MG capsule Take 1 capsule (60 mg total) by mouth daily. 09/13/13   Magda Paganini  S Lewis, PA-C  DULoxetine (CYMBALTA) 60 MG capsule Take 60 mg by mouth daily.    Historical Provider, MD  fenofibrate (TRICOR) 145 MG tablet Take 145 mg by mouth daily.    Historical Provider, MD  folic acid (FOLVITE) 1 MG tablet Take 1 mg by mouth daily.    Historical Provider, MD  gabapentin (NEURONTIN) 100 MG capsule Take 100-200 mg by mouth 4 (four) times daily.     Historical Provider, MD  insulin detemir (LEVEMIR) 100 UNIT/ML injection Inject 50 Units into the skin at bedtime.     Historical Provider, MD  LORazepam (ATIVAN) 1 MG tablet Take 1 mg by mouth every 4 (four) hours as needed. For  nerves    Historical Provider, MD  metFORMIN (GLUCOPHAGE) 1000 MG tablet Take 1,000 mg by mouth 2 (two) times daily with a meal.    Historical Provider, MD  metoCLOPramide (REGLAN) 5 MG tablet Take 1 or 2 tablets 3 times a day before meals and at bedtime 04/09/14   [name], PA-C  Multiple Vitamin (MULTIVITAMIN) tablet Take 1 tablet by mouth daily.    Historical Provider, MD  oxyCODONE-acetaminophen (PERCOCET) 5-325 MG per tablet Take 1-2 tablets by mouth every 4 (four) hours as needed. 04/19/14   [name], MD  pioglitazone (ACTOS) 45 MG tablet Take 45 mg by mouth daily.    Historical Provider, MD  promethazine (PHENERGAN) 25 MG tablet Take 1 tablet (25 mg total) by mouth every 6 (six) hours as needed. 04/19/14   [name], MD  rosuvastatin (CRESTOR) 20 MG tablet Take 20 mg by mouth at bedtime.     Historical Provider, MD  traMADol (ULTRAM) 50 MG tablet Take 50-100 mg by mouth every 6 (six) hours as needed. For pain    Historical Provider, MD   BP 172/72  Pulse 78  Temp(Src) 98.7 F (37.1 C) (Oral)  Resp 20  Ht 5\' 6"  (1.676 m)  Wt 240 lb (108.863 kg)  BMI 38.76 kg/m2  SpO2 97% Physical Exam  Nursing note and vitals reviewed. Constitutional: He is oriented to person, place, and time. He appears well-developed and well-nourished.  HENT:  Head: Normocephalic and atraumatic.  Eyes: Conjunctivae are normal.  Neck: Normal range of motion. Neck supple.  Cardiovascular: Normal rate, regular rhythm and normal heart sounds.   No murmur heard. Pulmonary/Chest: Effort normal and breath sounds normal. No respiratory distress. He has no wheezes. He has no rales.  Abdominal: Soft. Bowel sounds are normal. He exhibits no distension. There is no tenderness. There is no rebound and no guarding.  Musculoskeletal: Normal range of motion.  Neurological: He is alert and oriented to person, place, and time.  Skin: Skin is warm. He is diaphoretic (mildly).  Psychiatric: He has a normal mood and  affect.    ED Course  Procedures (including critical care time) Labs Review Labs Reviewed  CBC WITH DIFFERENTIAL  BASIC METABOLIC PANEL  TROPONIN I  D-DIMER, QUANTITATIVE    Imaging Review No results found.   EKG Interpretation   Date/Time:  Sunday June 22 2014 17:59:40 EDT Ventricular Rate:  80 PR Interval:  134 QRS Duration: 80 QT Interval:  320 QTC Calculation: 369 R Axis:   102 Text Interpretation:  Normal sinus rhythm Anterolateral infarct , age  undetermined Abnormal ECG Confirmed by DELOS  MD, Judith Campillo ([id number]) on  06/23/2014 12:04:46 AM     DIAGNOSTIC STUDIES: Oxygen Saturation is 97% on room air, normal by my interpretation.    COORDINATION  OF CARE: 6:46 PM Discussed treatment plan with patient at beside, including blood work and chest x-ray. The patient agrees with the plan and has no further questions at this time.   MDM   Final diagnoses:  None    Patient presents with complaints of his lungs burning and difficulty breathing. He reports productive cough and reports feeling chilled and sweaty. He is diabetic, but has no history of coronary artery disease. The symptoms have been going on since yesterday. Workup reveals an unchanged EKG and negative troponin. His d-dimer was negative and there is no hypoxia or tachycardia. I highly doubt PE. His BNP was within the normal range and there is no finding for congestive heart failure. This appears to be a bronchitis which will be treated with cough medication and zithromax.  To return as needed if his symptoms worsen or change.  I personally performed the services described in this documentation, which was scribed in my presence. The recorded information has been reviewed and is accurate.      Veryl Speak, MD 06/23/14 [phone]

## 2014-06-22 NOTE — Discharge Instructions (Signed)
Zithromax as prescribed.  Tussionex as prescribed as needed for cough.  Return to the emergency department if you develop worsening discomfort, difficulty breathing, or other new and concerning symptoms.   Acute Bronchitis Bronchitis is inflammation of the airways that extend from the windpipe into the lungs (bronchi). The inflammation often causes mucus to develop. This leads to a cough, which is the most common symptom of bronchitis.  In acute bronchitis, the condition usually develops suddenly and goes away over time, usually in a couple weeks. Smoking, allergies, and asthma can make bronchitis worse. Repeated episodes of bronchitis may cause further lung problems.  CAUSES Acute bronchitis is most often caused by the same virus that causes a cold. The virus can spread from person to person (contagious) through coughing, sneezing, and touching contaminated objects. SIGNS AND SYMPTOMS   Cough.   Fever.   Coughing up mucus.   Body aches.   Chest congestion.   Chills.   Shortness of breath.   Sore throat.  DIAGNOSIS  Acute bronchitis is usually diagnosed through a physical exam. Your health care provider will also ask you questions about your medical history. Tests, such as chest X-rays, are sometimes done to rule out other conditions.  TREATMENT  Acute bronchitis usually goes away in a couple weeks. Oftentimes, no medical treatment is necessary. Medicines are sometimes given for relief of fever or cough. Antibiotic medicines are usually not needed but may be prescribed in certain situations. In some cases, an inhaler may be recommended to help reduce shortness of breath and control the cough. A cool mist vaporizer may also be used to help thin bronchial secretions and make it easier to clear the chest.  HOME CARE INSTRUCTIONS  Get plenty of rest.   Drink enough fluids to keep your urine clear or pale yellow (unless you have a medical condition that requires fluid  restriction). Increasing fluids may help thin your respiratory secretions (sputum) and reduce chest congestion, and it will prevent dehydration.   Take medicines only as directed by your health care provider.  If you were prescribed an antibiotic medicine, finish it all even if you start to feel better.  Avoid smoking and secondhand smoke. Exposure to cigarette smoke or irritating chemicals will make bronchitis worse. If you are a smoker, consider using nicotine gum or skin patches to help control withdrawal symptoms. Quitting smoking will help your lungs heal faster.   Reduce the chances of another bout of acute bronchitis by washing your hands frequently, avoiding people with cold symptoms, and trying not to touch your hands to your mouth, nose, or eyes.   Keep all follow-up visits as directed by your health care provider.  SEEK MEDICAL CARE IF: Your symptoms do not improve after 1 week of treatment.  SEEK IMMEDIATE MEDICAL CARE IF:  You develop an increased fever or chills.   You have chest pain.   You have severe shortness of breath.  You have bloody sputum.   You develop dehydration.  You faint or repeatedly feel like you are going to pass out.  You develop repeated vomiting.  You develop a severe headache. MAKE SURE YOU:   Understand these instructions.  Will watch your condition.  Will get help right away if you are not doing well or get worse. Document Released: 10/06/2004 Document Revised: 01/13/2014 Document Reviewed: 02/19/2013 [facility] Patient Information 2015 [place], [place]. This information is not intended to replace advice given to you by your health care provider. Make sure you discuss any  questions you have with your health care provider.  Chest Pain (Nonspecific) It is often hard to give a specific diagnosis for the cause of chest pain. There is always a chance that your pain could be related to something serious, such as a heart attack or a blood  clot in the lungs. You need to follow up with your health care provider for further evaluation. CAUSES   Heartburn.  Pneumonia or bronchitis.  Anxiety or stress.  Inflammation around your heart (pericarditis) or lung (pleuritis or pleurisy).  A blood clot in the lung.  A collapsed lung (pneumothorax). It can develop suddenly on its own (spontaneous pneumothorax) or from trauma to the chest.  Shingles infection (herpes zoster virus). The chest wall is composed of bones, muscles, and cartilage. Any of these can be the source of the pain.  The bones can be bruised by injury.  The muscles or cartilage can be strained by coughing or overwork.  The cartilage can be affected by inflammation and become sore (costochondritis). DIAGNOSIS  Lab tests or other studies may be needed to find the cause of your pain. Your health care provider may have you take a test called an ambulatory electrocardiogram (ECG). An ECG records your heartbeat patterns over a 24-hour period. You may also have other tests, such as:  Transthoracic echocardiogram (TTE). During echocardiography, sound waves are used to evaluate how blood flows through your heart.  Transesophageal echocardiogram (TEE).  Cardiac monitoring. This allows your health care provider to monitor your heart rate and rhythm in real time.  Holter monitor. This is a portable device that records your heartbeat and can help diagnose heart arrhythmias. It allows your health care provider to track your heart activity for several days, if needed.  Stress tests by exercise or by giving medicine that makes the heart beat faster. TREATMENT   Treatment depends on what may be causing your chest pain. Treatment may include:  Acid blockers for heartburn.  Anti-inflammatory medicine.  Pain medicine for inflammatory conditions.  Antibiotics if an infection is present.  You may be advised to change lifestyle habits. This includes stopping smoking and  avoiding alcohol, caffeine, and chocolate.  You may be advised to keep your head raised (elevated) when sleeping. This reduces the chance of acid going backward from your stomach into your esophagus. Most of the time, nonspecific chest pain will improve within 2-3 days with rest and mild pain medicine.  HOME CARE INSTRUCTIONS   If antibiotics were prescribed, take them as directed. Finish them even if you start to feel better.  For the next few days, avoid physical activities that bring on chest pain. Continue physical activities as directed.  Do not use any tobacco products, including cigarettes, chewing tobacco, or electronic cigarettes.  Avoid drinking alcohol.  Only take medicine as directed by your health care provider.  Follow your health care provider's suggestions for further testing if your chest pain does not go away.  Keep any follow-up appointments you made. If you do not go to an appointment, you could develop lasting (chronic) problems with pain. If there is any problem keeping an appointment, call to reschedule. SEEK MEDICAL CARE IF:   Your chest pain does not go away, even after treatment.  You have a rash with blisters on your chest.  You have a fever. SEEK IMMEDIATE MEDICAL CARE IF:   You have increased chest pain or pain that spreads to your arm, neck, jaw, back, or abdomen.  You have shortness of  breath.  You have an increasing cough, or you cough up blood.  You have severe back or abdominal pain.  You feel nauseous or vomit.  You have severe weakness.  You faint.  You have chills. This is an emergency. Do not wait to see if the pain will go away. Get medical help at once. Call your local emergency services (911 in [place] Do not drive yourself to the hospital. MAKE SURE YOU:   Understand these instructions.  Will watch your condition.  Will get help right away if you are not doing well or get worse. Document Released: 06/08/2005 Document Revised:  09/03/2013 Document Reviewed: 04/03/2008 [facility] Patient Information 2015 [place], [place]. This information is not intended to replace advice given to you by your health care provider. Make sure you discuss any questions you have with your health care provider.

## 2014-06-22 NOTE — ED Notes (Signed)
Patient reports "it hurts to breathe." states chest pain with deep breathing and coughing. States breaking out in a sweat and cold chills frequently. Patient also states, "It's hard to breathe."

## 2014-06-22 NOTE — ED Notes (Signed)
Pt refusing IV at this time.

## 2014-07-04 ENCOUNTER — Encounter (HOSPITAL_COMMUNITY): Payer: Managed Care, Other (non HMO) | Attending: Hematology and Oncology

## 2014-07-04 DIAGNOSIS — K909 Intestinal malabsorption, unspecified: Secondary | ICD-10-CM | POA: Insufficient documentation

## 2014-07-04 DIAGNOSIS — D508 Other iron deficiency anemias: Secondary | ICD-10-CM | POA: Diagnosis present

## 2014-07-04 DIAGNOSIS — D509 Iron deficiency anemia, unspecified: Secondary | ICD-10-CM

## 2014-07-04 LAB — CBC WITH DIFFERENTIAL/PLATELET
Basophils Absolute: 0 K/uL (ref 0.0–0.1)
Basophils Relative: 0 % (ref 0–1)
Eosinophils Absolute: 0.1 K/uL (ref 0.0–0.7)
Eosinophils Relative: 1 % (ref 0–5)
HCT: 37.4 % — ABNORMAL LOW (ref 39.0–52.0)
Hemoglobin: 12.3 g/dL — ABNORMAL LOW (ref 13.0–17.0)
Lymphocytes Relative: 24 % (ref 12–46)
Lymphs Abs: 2.9 K/uL (ref 0.7–4.0)
MCH: 30.2 pg (ref 26.0–34.0)
MCHC: 32.9 g/dL (ref 30.0–36.0)
MCV: 91.9 fL (ref 78.0–100.0)
Monocytes Absolute: 0.7 K/uL (ref 0.1–1.0)
Monocytes Relative: 6 % (ref 3–12)
Neutro Abs: 8.2 K/uL — ABNORMAL HIGH (ref 1.7–7.7)
Neutrophils Relative %: 69 % (ref 43–77)
Platelets: 489 K/uL — ABNORMAL HIGH (ref 150–400)
RBC: 4.07 MIL/uL — ABNORMAL LOW (ref 4.22–5.81)
RDW: 12.8 % (ref 11.5–15.5)
WBC: 11.9 K/uL — ABNORMAL HIGH (ref 4.0–10.5)

## 2014-07-04 LAB — IRON AND TIBC
Iron: 86 ug/dL (ref 42–135)
Saturation Ratios: 20 % (ref 20–55)
TIBC: 425 ug/dL (ref 215–435)
UIBC: 339 ug/dL (ref 125–400)

## 2014-07-04 LAB — FERRITIN: Ferritin: 186 ng/mL (ref 22–322)

## 2014-07-04 NOTE — Progress Notes (Signed)
LABS FOR CBCD,FER,IRON/TIBC

## 2014-07-23 ENCOUNTER — Encounter: Payer: Self-pay | Admitting: Internal Medicine

## 2014-08-11 ENCOUNTER — Other Ambulatory Visit (HOSPITAL_COMMUNITY): Payer: Self-pay | Admitting: Oncology

## 2014-08-11 DIAGNOSIS — R110 Nausea: Secondary | ICD-10-CM

## 2014-08-11 MED ORDER — METOCLOPRAMIDE HCL 5 MG PO TABS: 5.0000 mg | ORAL_TABLET | Freq: Three times a day (TID) | ORAL | Status: DC | PRN

## 2014-09-10 ENCOUNTER — Other Ambulatory Visit: Payer: Self-pay | Admitting: Gastroenterology

## 2014-09-12 HISTORY — PX: CATARACT EXTRACTION, BILATERAL: SHX1313

## 2014-09-23 ENCOUNTER — Ambulatory Visit (INDEPENDENT_AMBULATORY_CARE_PROVIDER_SITE_OTHER): Payer: Managed Care, Other (non HMO) | Admitting: Nurse Practitioner

## 2014-09-23 ENCOUNTER — Encounter: Payer: Self-pay | Admitting: Nurse Practitioner

## 2014-09-23 VITALS — BP 146/72 | HR 87 | Temp 97.2°F | Ht 68.0 in | Wt 241.0 lb

## 2014-09-23 DIAGNOSIS — R109 Unspecified abdominal pain: Secondary | ICD-10-CM

## 2014-09-23 DIAGNOSIS — D500 Iron deficiency anemia secondary to blood loss (chronic): Secondary | ICD-10-CM

## 2014-09-23 DIAGNOSIS — R110 Nausea: Secondary | ICD-10-CM

## 2014-09-23 NOTE — Patient Instructions (Signed)
1. If you have another episode of dark stools like you did last year, call us and let us know so we can work you in for a visit. 2. Continue your current medications. 3. We will draw labs to check your anemia. 4. Return for re-evaluation in 4 months.

## 2014-09-23 NOTE — Progress Notes (Signed)
cc'ed to pcp °

## 2014-09-23 NOTE — Progress Notes (Signed)
Referring Provider: [name], MD Primary Care Physician:  [name], MD  Primary GI: Dr. Gala Romney  Chief Complaint  Patient presents with  . Follow-up    HPI:   64 year old male presents for follow-up on vague abdominal pain and nausea. Patient is somewhat of a poor historian. Patient with a history of GERD and known situs inversus. Iron deficiecny anemia was resolved at last visit. Since the last visit has had a gastric emptying study to evaluate for gastroparesis due to poorly controlled diabetes. Results showed 29% retention at 60 min and 1.4% retention at 120 min (normal is <30% at 120 min).   States he is currently still having symptoms of abdominal pain, nausea, and cold sweats occasionally  times a week but will at times not have any for a week or two. Nausea medication typically helps. Cannot identify any known food triggers. Tends to eat "right much" fried food saying "about everything we cook is fried." Currently taking Reglan 2 tabs in the morning before eating and then 2 tabs in the evening. States that he had one week of dark stool in early November 2015 which self-resolved. He was unable to definitively state if it was dark green or black. Unsure if it was sticky or solid. No further recurrence. Is not taking Iron or pepto bismol. Last time he had an occurrence like this he had an EGD (11/09/12) which showed normal esophagus, tiny erosions primarily in the antrum, no ulcer, and reversed stomach consistent with situs inversus. Occasional abdominal pain "but not anything like I used to have." Denies vomiting. Denies hematochezia. Denies any other upper or lower GI symptoms. Overall states he's doing much better then he was.  Past Medical History  Diagnosis Date  . Diabetes mellitus   . Depression   . HTN (hypertension)     Cholesterol  . Dextrocardia   . H. pylori infection 11/09/2012    treated with pylera  . Iron deficiency anemia, unspecified 10/18/2012    Past  Surgical History  Procedure Laterality Date  . Neck surgery      bone spurs  . Colonoscopy with esophagogastroduodenoscopy (egd) N/A 11/09/2012    Dr. 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 hpylori on bx- treated with pylera. TCS- normal rectum. 1 diminutive polyp in the mid descending segment. 1-74mm polyp in the mid desending segment o/w the remainder of the colonic mucosa appeared normal. tubular adenoma on bx  . Givens capsule study N/A 10/07/2013    no source for anemia or heme positive stool noted    Current Outpatient Prescriptions  Medication Sig Dispense Refill  . chlorpheniramine-HYDROcodone (TUSSIONEX PENNKINETIC ER) 10-8 MG/5ML LQCR Take 5 mLs by mouth every 12 (twelve) hours as needed for cough. 115 mL 0  . DEXILANT 60 MG capsule TAKE (1) CAPSULE BY MOUTH EVERY DAY. 30 capsule 11  . DULoxetine (CYMBALTA) 60 MG capsule Take 60 mg by mouth daily.    . fenofibrate (TRICOR) 145 MG tablet Take 145 mg by mouth daily.    . folic acid (FOLVITE) 1 MG tablet Take 1 mg by mouth daily.    Marland Kitchen gabapentin (NEURONTIN) 100 MG capsule Take 100-200 mg by mouth 3 (three) times daily. Prescribed one capsule in the morning and at noon, then take two capsules at bedtime    . insulin detemir (LEVEMIR) 100 UNIT/ML injection Inject 50 Units into the skin at bedtime.     Marland Kitchen  LORazepam (ATIVAN) 1 MG tablet Take 1 mg by mouth every 4 (four) hours as needed. For nerves    . metFORMIN (GLUCOPHAGE) 1000 MG tablet Take 1,000 mg by mouth 2 (two) times daily with a meal.    . metoCLOPramide (REGLAN) 5 MG tablet Take 1-2 tablets (5-10 mg total) by mouth 3 (three) times daily as needed. Take 1 or 2 tablets 3 times a day before meals and at bedtime 100 tablet 3  . Multiple Vitamin (MULTIVITAMIN WITH MINERALS) TABS tablet Take 1 tablet by mouth daily.    . pioglitazone (ACTOS) 45 MG tablet Take 45 mg by mouth daily.    . rosuvastatin  (CRESTOR) 20 MG tablet Take 20 mg by mouth at bedtime.     . traMADol (ULTRAM) 50 MG tablet Take 50-100 mg by mouth every 6 (six) hours as needed. For pain    . azithromycin (ZITHROMAX Z-PAK) 250 MG tablet 2 po day one, then 1 daily x 4 days (Patient not taking: Reported on 09/23/2014) 6 tablet 0   No current facility-administered medications for this visit.    Allergies as of 09/23/2014  . (No Known Allergies)    Family History  Problem Relation Age of Onset  . Diabetes Sister     Fx  . Heart defect Sister     Fx  . Arthritis Sister     Fx  . Asthma Sister     Fx  . Kidney disease Sister     Fx  . Pancreatitis Sister     History   Social History  . Marital Status: Married    Spouse Name: N/A    Number of Children: 0  . 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 Smoking status: Never Smoker   . Smokeless tobacco: Current User    Types: Chew  . Alcohol Use: No  . Drug Use: No  . Sexual Activity: None   Other Topics Concern  . None   Social History Narrative    Review of Systems: Gen: Denies fever, anorexia. Denies fatigue, weakness, weight loss.  CV: Denies chest pain, palpitations, syncope, peripheral edema, and claudication. Resp: Denies dyspnea at rest, cough, wheezing, coughing up blood, and pleurisy. GI: Denies vomiting blood, jaundice, and fecal incontinence.   Denies dysphagia or odynophagia. Derm: Denies rash, itching, dry skin Psych: Denies depression, anxiety, memory loss, confusion.  Heme: Denies bruising, bleeding, and enlarged lymph nodes.  Physical Exam: BP 146/72 mmHg  Pulse 87  Temp(Src) 97.2 F (36.2 C) (Oral)  Ht 5\' 8"  (1.727 m)  Wt 241 lb (109.317 kg)  BMI 36.65 kg/m2 General:   Alert and oriented. No distress noted. Pleasant and cooperative.  Head:  Normocephalic and atraumatic. Heart:  S1, S2 present without murmurs, rubs, or gallops. Regular rate and rhythm. Abdomen:   +BS, soft, non-tender and non-distended. No rebound or guarding. No HSM or masses noted. Pulses:  2+ DP noted bilaterally Extremities:  Without edema. Neurologic:  Alert and  oriented x4;  grossly normal neurologically. Skin:  Intact without significant lesions or rashes. Psych:  Alert and cooperative. Normal mood and affect.    09/23/2014 11:38 AM

## 2014-09-26 NOTE — Assessment & Plan Note (Signed)
Chronic very mild anemia with hgb averaging 12.6-10.0 over the past year. No overt signs/symptoms of current GI bleeding. Will recheck CBC to ensure no acute change. Last EGD normal esophagus, some gastric erosions, H. Pylori negative. Currently on Dexilant 60mg  daily. Abdomen non-tender on exam. Encouraged patient to call us if he has any changes such as dark stools of blood in his stool. Will follow-up regardless in 4 months to re-evaluate. No reg flag/warning signs at this time. If continued symptoms can consider possible EGD.

## 2014-09-26 NOTE — Assessment & Plan Note (Signed)
Chronic very mild anemia with hgb averaging 12.6-10.0 over the past year. No overt signs/symptoms of current GI bleeding. Will recheck CBC to ensure no acute change. Last EGD normal esophagus, some gastric erosions, H. Pylori negative. Currently on Dexilant 60mg  daily. Abdomen non-tender on exam. Encouraged patient to call us if he has any changes such as dark stools of blood in his stool. Will follow-up regardless in 4 months to re-evaluate. No reg flag/warning signs at this time.

## 2014-10-02 ENCOUNTER — Encounter (HOSPITAL_COMMUNITY): Payer: Managed Care, Other (non HMO) | Attending: Oncology

## 2014-10-02 DIAGNOSIS — D508 Other iron deficiency anemias: Secondary | ICD-10-CM | POA: Diagnosis present

## 2014-10-02 DIAGNOSIS — D473 Essential (hemorrhagic) thrombocythemia: Secondary | ICD-10-CM | POA: Diagnosis present

## 2014-10-02 DIAGNOSIS — I10 Essential (primary) hypertension: Secondary | ICD-10-CM | POA: Insufficient documentation

## 2014-10-02 DIAGNOSIS — F329 Major depressive disorder, single episode, unspecified: Secondary | ICD-10-CM | POA: Diagnosis not present

## 2014-10-02 DIAGNOSIS — E119 Type 2 diabetes mellitus without complications: Secondary | ICD-10-CM | POA: Insufficient documentation

## 2014-10-02 DIAGNOSIS — K909 Intestinal malabsorption, unspecified: Secondary | ICD-10-CM

## 2014-10-02 DIAGNOSIS — D509 Iron deficiency anemia, unspecified: Secondary | ICD-10-CM

## 2014-10-02 LAB — IRON AND TIBC
Iron: 93 ug/dL (ref 42–165)
Saturation Ratios: 21 % (ref 20–55)
TIBC: 450 ug/dL — ABNORMAL HIGH (ref 215–435)
UIBC: 357 ug/dL (ref 125–400)

## 2014-10-02 LAB — CBC
HCT: 38.9 % — ABNORMAL LOW (ref 39.0–52.0)
HEMOGLOBIN: 12.7 g/dL — AB (ref 13.0–17.0)
MCH: 29.5 pg (ref 26.0–34.0)
MCHC: 32.6 g/dL (ref 30.0–36.0)
MCV: 90.5 fL (ref 78.0–100.0)
MPV: 9.6 fL (ref 8.6–12.4)
Platelets: 501 10*3/uL — ABNORMAL HIGH (ref 150–400)
RBC: 4.3 MIL/uL (ref 4.22–5.81)
RDW: 13.4 % (ref 11.5–15.5)
WBC: 11.6 10*3/uL — ABNORMAL HIGH (ref 4.0–10.5)

## 2014-10-02 LAB — CBC WITH DIFFERENTIAL/PLATELET
BASOS PCT: 0 % (ref 0–1)
Basophils Absolute: 0 10*3/uL (ref 0.0–0.1)
Eosinophils Absolute: 0.1 10*3/uL (ref 0.0–0.7)
Eosinophils Relative: 1 % (ref 0–5)
HEMATOCRIT: 37.4 % — AB (ref 39.0–52.0)
Hemoglobin: 12 g/dL — ABNORMAL LOW (ref 13.0–17.0)
Lymphocytes Relative: 31 % (ref 12–46)
Lymphs Abs: 3.2 10*3/uL (ref 0.7–4.0)
MCH: 29.6 pg (ref 26.0–34.0)
MCHC: 32.1 g/dL (ref 30.0–36.0)
MCV: 92.1 fL (ref 78.0–100.0)
MONOS PCT: 7 % (ref 3–12)
Monocytes Absolute: 0.7 10*3/uL (ref 0.1–1.0)
NEUTROS PCT: 61 % (ref 43–77)
Neutro Abs: 6.4 10*3/uL (ref 1.7–7.7)
Platelets: 459 10*3/uL — ABNORMAL HIGH (ref 150–400)
RBC: 4.06 MIL/uL — ABNORMAL LOW (ref 4.22–5.81)
RDW: 13.2 % (ref 11.5–15.5)
WBC: 10.3 10*3/uL (ref 4.0–10.5)

## 2014-10-02 LAB — FERRITIN: FERRITIN: 123 ng/mL (ref 22–322)

## 2014-10-02 NOTE — Progress Notes (Signed)
LABS FOR CBC,FERR,IRON/TIBC

## 2014-10-03 ENCOUNTER — Other Ambulatory Visit (HOSPITAL_COMMUNITY): Payer: Managed Care, Other (non HMO)

## 2014-10-06 ENCOUNTER — Ambulatory Visit (HOSPITAL_COMMUNITY): Payer: Managed Care, Other (non HMO) | Admitting: Hematology & Oncology

## 2014-10-06 NOTE — Progress Notes (Deleted)
Brett Hampshire, MD [place] [address]  Iron deficiency Anemia  DIAGNOSIS: Iron deficiency anemia, IV iron replacement  SUMMARY OF ONCOLOGIC HISTORY:  No history exists.    CURRENT THERAPY:  INTERVAL HISTORY: Brett [name] 64 y.o. male returns for   MEDICAL HISTORY: Past Medical History  Diagnosis Date  . Diabetes mellitus   . Depression   . HTN (hypertension)     Cholesterol  . Dextrocardia   . H. pylori infection 11/09/2012    treated with pylera  . Iron deficiency anemia, unspecified 10/18/2012    has SPINAL STENOSIS; TENDINITIS, CALCIFIC, SHOULDER, RIGHT; IMPINGEMENT SYNDROME; HIP PAIN; GERD (gastroesophageal reflux disease); Nausea; Iron deficiency anemia due to chronic blood loss; Gastritis, Helicobacter pylori; Insulin-requiring or dependent type II diabetes mellitus; Malabsorption of iron; and Abdominal pain on his problem list.     has No Known Allergies.  Brett [name] does not currently have medications on file.  SURGICAL HISTORY: Past Surgical History  Procedure Laterality Date  . Neck surgery      bone spurs  . Colonoscopy with esophagogastroduodenoscopy (egd) N/A 11/09/2012    Dr. 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 hpylori on bx- treated with pylera. TCS- normal rectum. 1 diminutive polyp in the mid descending segment. 1-44mm polyp in the mid desending segment o/w the remainder of the colonic mucosa appeared normal. tubular adenoma on bx  . Givens capsule study N/A 10/07/2013    no source for anemia or heme positive stool noted    SOCIAL HISTORY: History   Social History  . Marital Status: Married    Spouse Name: N/A    Number of Children: 0  . 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 Smoking status: Never Smoker   . Smokeless tobacco: Current User    Types: Chew  .  Alcohol Use: No  . Drug Use: No  . Sexual Activity: Not on file   Other Topics Concern  . Not on file   Social History Narrative    FAMILY HISTORY: Family History  Problem Relation Age of Onset  . Diabetes Sister     Fx  . Heart defect Sister     Fx  . Arthritis Sister     Fx  . Asthma Sister     Fx  . Kidney disease Sister     Fx  . Pancreatitis Sister     ROS  PHYSICAL EXAMINATION  ECOG PERFORMANCE STATUS: {CHL ONC ECOG PP:[id number]}  There were no vitals filed for this visit.  Physical Exam  LABORATORY DATA:  CBC    Component Value Date/Time   WBC 10.3 10/02/2014 1006   RBC 4.06* 10/02/2014 1006   HGB 12.0* 10/02/2014 1006   HCT 37.4* 10/02/2014 1006   PLT 459* 10/02/2014 1006   MCV 92.1 10/02/2014 1006   MCH 29.6 10/02/2014 1006   MCHC 32.1 10/02/2014 1006   RDW 13.2 10/02/2014 1006   LYMPHSABS 3.2 10/02/2014 1006   MONOABS 0.7 10/02/2014 1006   EOSABS 0.1 10/02/2014 1006   BASOSABS 0.0 10/02/2014 1006   CMP     Component Value Date/Time   NA 141 06/22/2014 1848   K 4.3 06/22/2014 1848   CL 101 06/22/2014 1848   CO2 25 06/22/2014 1848   GLUCOSE 166* 06/22/2014 1848   BUN 7 06/22/2014 1848  CREATININE 0.77 06/22/2014 1848   CREATININE 0.93 08/30/2013 1059   CALCIUM 10.3 06/22/2014 1848   PROT 7.7 04/19/2014 1044   PROT 6.9 09/18/2012   ALBUMIN 4.2 04/19/2014 1044   AST 22 04/19/2014 1044   AST 17 09/18/2012   ALT 18 04/19/2014 1044   ALKPHOS 50 04/19/2014 1044   ALKPHOS 42 09/18/2012   BILITOT 0.2* 04/19/2014 1044   BILITOT 0.3 09/18/2012   GFRNONAA >90 06/22/2014 1848   GFRAA >90 06/22/2014 1848       ASSESSMENT and THERAPY PLAN:    No problem-specific assessment & plan notes found for this encounter.   All questions were answered. The patient knows to call the clinic with any problems, questions or concerns. We can certainly see the patient much sooner if necessary.  I spent {CHL ONC TIME VISIT - QMKJI:[id number]}  counseling the patient face to face. The total time spent in the appointment was {CHL ONC TIME VISIT - RJPVG:[id number]}.  Molli Hazard 10/06/2014     This encounter was created in error - please disregard.

## 2014-10-07 NOTE — Progress Notes (Signed)
Erroneous encounter

## 2014-10-08 ENCOUNTER — Encounter (HOSPITAL_COMMUNITY): Payer: Self-pay | Admitting: Hematology & Oncology

## 2014-10-08 ENCOUNTER — Encounter (HOSPITAL_BASED_OUTPATIENT_CLINIC_OR_DEPARTMENT_OTHER): Payer: Managed Care, Other (non HMO) | Admitting: Hematology & Oncology

## 2014-10-08 VITALS — BP 144/66 | HR 92 | Temp 98.3°F | Resp 20 | Wt 238.5 lb

## 2014-10-08 DIAGNOSIS — D649 Anemia, unspecified: Secondary | ICD-10-CM

## 2014-10-08 DIAGNOSIS — D500 Iron deficiency anemia secondary to blood loss (chronic): Secondary | ICD-10-CM

## 2014-10-08 DIAGNOSIS — D473 Essential (hemorrhagic) thrombocythemia: Secondary | ICD-10-CM

## 2014-10-08 DIAGNOSIS — D75839 Thrombocytosis, unspecified: Secondary | ICD-10-CM

## 2014-10-08 NOTE — Patient Instructions (Signed)
[place] at [facility]  Discharge Instructions:  Please call with any problems or concerns prior to your next follow-up We will see you back in 3 months with labs. Currently you do not need any additional iron infusions _______________________________________________________________  Thank you for choosing [place] at [facility] to provide your oncology and hematology care.  To afford each patient quality time with our providers, please arrive at least 15 minutes before your scheduled appointment.  You need to re-schedule your appointment if you arrive 10 or more minutes late.  We strive to give you quality time with our providers, and arriving late affects you and other patients whose appointments are after yours.  Also, if you no show three or more times for appointments you may be dismissed from the clinic.  Again, thank you for choosing [place] [facility] [place] hope is that these requests will allow you access to exceptional care and in a timely manner. _______________________________________________________________  If you have questions after your visit, please contact our office at [phone] between the hours of 8:30 a.m. and 5:00 p.m. Voicemails left after 4:30 p.m. will not be returned until the following business day. _______________________________________________________________  For prescription refill requests, have your pharmacy contact our office. _______________________________________________________________  Recommendations made by the consultant and any test results will be sent to your referring physician. _______________________________________________________________

## 2014-10-08 NOTE — Progress Notes (Signed)
Brett Hampshire, MD [place] [address]  Anemia, unspecified anemia type - Plan: CBC with Differential, Comprehensive metabolic panel, Ferritin, Vitamin B12, Iron and TIBC  Thrombocytosis  Iron deficiency anemia due to chronic blood loss  DIAGNOSIS: Iron deficiency Anemia Delayed gastric emptying on pill cam, normal gastric emptying scan on 05/22/2014 Capsule Endoscopy 10/10/2013 no  AVMS, ulcerations, mass  EGD 11/09/2012 history of gastric erosions Colonoscopy 11/09/2012 with polyps  CURRENT THERAPY: Skipper Cliche last given on 01/10/2014  INTERVAL HISTORY: Brett [name] 64 y.o. male returns for iron deficiency anemia.  He complains of occasional constipation and intermittent nausea. He follows with Dr. Sydell Axon.  He has occasional problems with sleep. No other major concerns today. Last iron infusion was on 01/10/2014.  MEDICAL HISTORY: Past Medical History  Diagnosis Date  . Diabetes mellitus   . Depression   . HTN (hypertension)     Cholesterol  . Dextrocardia   . H. pylori infection 11/09/2012    treated with pylera  . Iron deficiency anemia, unspecified 10/18/2012    has SPINAL STENOSIS; TENDINITIS, CALCIFIC, SHOULDER, RIGHT; IMPINGEMENT SYNDROME; HIP PAIN; GERD (gastroesophageal reflux disease); Nausea; Iron deficiency anemia due to chronic blood loss; Gastritis, Helicobacter pylori; Insulin-requiring or dependent type II diabetes mellitus; Malabsorption of iron; Abdominal pain; and Thrombocytosis on his problem list.     has No Known Allergies.  Mr. Hollabaugh had no medications administered during this visit.  SURGICAL HISTORY: Past Surgical History  Procedure Laterality Date  . Neck surgery      bone spurs  . Colonoscopy with esophagogastroduodenoscopy (egd) N/A 11/09/2012    Dr. 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 hpylori on bx- treated with pylera. TCS- normal  rectum. 1 diminutive polyp in the mid descending segment. 1-47m polyp in the mid desending segment o/w the remainder of the colonic mucosa appeared normal. tubular adenoma on bx  . Givens capsule study N/A 10/07/2013    no source for anemia or heme positive stool noted    SOCIAL HISTORY: History   Social History  . Marital Status: Married    Spouse Name: N/A    Number of Children: 0  . 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 Smoking status: Never Smoker   . Smokeless tobacco: Current User    Types: Chew  . Alcohol Use: No  . Drug Use: No  . Sexual Activity: Not on file   Other Topics Concern  . Not on file   Social History Narrative    FAMILY HISTORY: Family History  Problem Relation Age of Onset  . Diabetes Sister     Fx  . Heart defect Sister     Fx  . Arthritis Sister     Fx  . Asthma Sister     Fx  . Kidney disease Sister     Fx  . Pancreatitis Sister     Review of Systems  Constitutional: Positive for malaise/fatigue. Negative for fever, chills, weight loss and diaphoresis.  HENT: Negative.   Eyes: Negative.   Respiratory: Negative.   Cardiovascular: Negative.   Gastrointestinal: Positive for nausea and constipation. Negative for vomiting, blood in stool and melena.  Genitourinary: Negative.   Musculoskeletal: Positive for joint pain.  Skin: Negative.   Neurological: Negative.  Negative for weakness.  Endo/Heme/Allergies: Negative.   Psychiatric/Behavioral: Negative for depression,  suicidal ideas, hallucinations and substance abuse. The patient has insomnia. The patient is not nervous/anxious.     PHYSICAL EXAMINATION  ECOG PERFORMANCE STATUS: 1 - Symptomatic but completely ambulatory  Filed Vitals:   10/08/14 1026  BP: 144/66  Pulse: 92  Temp: 98.3 F (36.8 C)  Resp: 20    Physical Exam  Constitutional: He is oriented to person, place, and time. No distress.  obese  HENT:    Head: Normocephalic and atraumatic.  Mouth/Throat: Oropharynx is clear and moist. No oropharyngeal exudate.  Eyes: Conjunctivae and EOM are normal. Pupils are equal, round, and reactive to light. No scleral icterus.  Neck: Normal range of motion. Neck supple. No JVD present. No tracheal deviation present. No thyromegaly present.  Cardiovascular: Normal rate, regular rhythm, normal heart sounds and intact distal pulses.  Exam reveals no gallop and no friction rub.   No murmur heard. Pulmonary/Chest: Effort normal. No stridor. No respiratory distress. He has wheezes.  Coarse rhonchi  Abdominal: Soft. Bowel sounds are normal. He exhibits no mass. There is no tenderness. There is no rebound and no guarding.  Large abdominal hernia, easily reduced  Musculoskeletal: Normal range of motion. He exhibits no edema.  Lymphadenopathy:    He has no cervical adenopathy.  Neurological: He is alert and oriented to person, place, and time. No cranial nerve deficit. Coordination normal.  Skin: Skin is warm and dry. He is not diaphoretic.  Psychiatric: Memory and judgment normal.    LABORATORY DATA:  CBC    Component Value Date/Time   WBC 10.3 10/02/2014 1006   RBC 4.06* 10/02/2014 1006   HGB 12.0* 10/02/2014 1006   HCT 37.4* 10/02/2014 1006   PLT 459* 10/02/2014 1006   MCV 92.1 10/02/2014 1006   MCH 29.6 10/02/2014 1006   MCHC 32.1 10/02/2014 1006   RDW 13.2 10/02/2014 1006   LYMPHSABS 3.2 10/02/2014 1006   MONOABS 0.7 10/02/2014 1006   EOSABS 0.1 10/02/2014 1006   BASOSABS 0.0 10/02/2014 1006   CMP     Component Value Date/Time   NA 141 06/22/2014 1848   K 4.3 06/22/2014 1848   CL 101 06/22/2014 1848   CO2 25 06/22/2014 1848   GLUCOSE 166* 06/22/2014 1848   BUN 7 06/22/2014 1848   CREATININE 0.77 06/22/2014 1848   CREATININE 0.93 08/30/2013 1059   CALCIUM 10.3 06/22/2014 1848   PROT 7.7 04/19/2014 1044   PROT 6.9 09/18/2012   ALBUMIN 4.2 04/19/2014 1044   AST 22 04/19/2014 1044    AST 17 09/18/2012   ALT 18 04/19/2014 1044   ALKPHOS 50 04/19/2014 1044   ALKPHOS 42 09/18/2012   BILITOT 0.2* 04/19/2014 1044   BILITOT 0.3 09/18/2012   GFRNONAA >90 06/22/2014 1848   GFRAA >90 06/22/2014 1848       ASSESSMENT and THERAPY PLAN:    Thrombocytosis 64 year old male with diabetes, hypertension, history of Hemoccult positive stool and iron deficiency anemia. He is unable to tolerate oral iron, he was last given IV iron in May 2015. His current iron levels are excellent.  He has a mild leukocytosis and thrombocytosis that is persistent. I believe this needs to be followed. He has never had a bone marrow biopsy nor any additional laboratory evaluation such as a JAK 2. Should his counts at his next follow-up in 3 months worsen I would recommend additional evaluation at that time.   Iron deficiency anemia due to chronic blood loss 64 year old male with iron deficiency anemia intolerant of oral iron.  Last IV iron infusion was given in May 2015. He still has a baseline anemia. He has normal kidney function. Other laboratory studies including N00 and folic acid have been normal. He has concomitant thrombocytosis, mild leukocytosis, his CBC needs to continue to be monitored moving forward. Should his anemia worsen or his thrombocytosis or leukocytosis worsen I would recommend additional peripheral evaluation and consideration of bone marrow at that time.     All questions were answered. The patient knows to call the clinic with any problems, questions or concerns. We can certainly see the patient much sooner if necessary.  Molli Hazard 10/09/2014

## 2014-10-09 DIAGNOSIS — D75839 Thrombocytosis, unspecified: Secondary | ICD-10-CM | POA: Insufficient documentation

## 2014-10-09 DIAGNOSIS — D473 Essential (hemorrhagic) thrombocythemia: Secondary | ICD-10-CM | POA: Insufficient documentation

## 2014-10-09 NOTE — Assessment & Plan Note (Signed)
64 year old male with iron deficiency anemia intolerant of oral iron. Last IV iron infusion was given in May 2015. He still has a baseline anemia. He has normal kidney function. Other laboratory studies including K15 and folic acid have been normal. He has concomitant thrombocytosis, mild leukocytosis, his CBC needs to continue to be monitored moving forward. Should his anemia worsen or his thrombocytosis or leukocytosis worsen I would recommend additional peripheral evaluation and consideration of bone marrow at that time.

## 2014-10-09 NOTE — Assessment & Plan Note (Signed)
64 year old male with diabetes, hypertension, history of Hemoccult positive stool and iron deficiency anemia. He is unable to tolerate oral iron, he was last given IV iron in May 2015. His current iron levels are excellent.  He has a mild leukocytosis and thrombocytosis that is persistent. I believe this needs to be followed. He has never had a bone marrow biopsy nor any additional laboratory evaluation such as a JAK 2. Should his counts at his next follow-up in 3 months worsen I would recommend additional evaluation at that time.

## 2015-01-05 ENCOUNTER — Encounter (HOSPITAL_COMMUNITY): Payer: Managed Care, Other (non HMO) | Attending: Hematology & Oncology

## 2015-01-05 DIAGNOSIS — D649 Anemia, unspecified: Secondary | ICD-10-CM | POA: Diagnosis not present

## 2015-01-05 LAB — COMPREHENSIVE METABOLIC PANEL
ALT: 19 U/L (ref 0–53)
AST: 25 U/L (ref 0–37)
Albumin: 4.3 g/dL (ref 3.5–5.2)
Alkaline Phosphatase: 40 U/L (ref 39–117)
Anion gap: 9 (ref 5–15)
BUN: 9 mg/dL (ref 6–23)
CALCIUM: 9.8 mg/dL (ref 8.4–10.5)
CHLORIDE: 102 mmol/L (ref 96–112)
CO2: 27 mmol/L (ref 19–32)
Creatinine, Ser: 0.8 mg/dL (ref 0.50–1.35)
GFR calc Af Amer: >90 mL/min (ref >90)
GFR calc non Af Amer: >90 mL/min (ref >90)
Glucose, Bld: 193 mg/dL — ABNORMAL HIGH (ref 70–99)
Potassium: 4.3 mmol/L (ref 3.5–5.1)
SODIUM: 138 mmol/L (ref 135–145)
Total Bilirubin: 0.4 mg/dL (ref 0.3–1.2)
Total Protein: 7.3 g/dL (ref 6.0–8.3)

## 2015-01-05 LAB — CBC WITH DIFFERENTIAL/PLATELET
Basophils Absolute: 0 10*3/uL (ref 0.0–0.1)
Basophils Relative: 0 % (ref 0–1)
EOS ABS: 0.1 10*3/uL (ref 0.0–0.7)
Eosinophils Relative: 1 % (ref 0–5)
HEMATOCRIT: 38.7 % — AB (ref 39.0–52.0)
Hemoglobin: 12.8 g/dL — ABNORMAL LOW (ref 13.0–17.0)
LYMPHS ABS: 2.8 10*3/uL (ref 0.7–4.0)
Lymphocytes Relative: 28 % (ref 12–46)
MCH: 30 pg (ref 26.0–34.0)
MCHC: 33.1 g/dL (ref 30.0–36.0)
MCV: 90.6 fL (ref 78.0–100.0)
MONO ABS: 0.7 10*3/uL (ref 0.1–1.0)
Monocytes Relative: 7 % (ref 3–12)
NEUTROS PCT: 64 % (ref 43–77)
Neutro Abs: 6.4 10*3/uL (ref 1.7–7.7)
PLATELETS: 388 10*3/uL (ref 150–400)
RBC: 4.27 MIL/uL (ref 4.22–5.81)
RDW: 12.8 % (ref 11.5–15.5)
WBC: 10.1 10*3/uL (ref 4.0–10.5)

## 2015-01-05 NOTE — Progress Notes (Signed)
[name] presented for labwork. Labs per MD order drawn via Peripheral Line 23 gauge needle inserted in left AC.  Good blood return present. Procedure without incident.  Needle removed intact. Patient tolerated procedure well.

## 2015-01-06 LAB — IRON AND TIBC
Iron: 100 ug/dL (ref 42–165)
Saturation Ratios: 20 % (ref 20–55)
TIBC: 494 ug/dL — AB (ref 215–435)
UIBC: 394 ug/dL (ref 125–400)

## 2015-01-06 LAB — FERRITIN: Ferritin: 129 ng/mL (ref 22–322)

## 2015-01-06 LAB — VITAMIN B12: Vitamin B-12: 330 pg/mL (ref 211–911)

## 2015-01-07 ENCOUNTER — Encounter (HOSPITAL_BASED_OUTPATIENT_CLINIC_OR_DEPARTMENT_OTHER): Payer: Managed Care, Other (non HMO) | Admitting: Hematology & Oncology

## 2015-01-07 ENCOUNTER — Encounter (HOSPITAL_COMMUNITY): Payer: Self-pay | Admitting: Hematology & Oncology

## 2015-01-07 VITALS — BP 167/66 | HR 84 | Temp 98.5°F | Resp 21 | Wt 247.0 lb

## 2015-01-07 DIAGNOSIS — Z720 Tobacco use: Secondary | ICD-10-CM

## 2015-01-07 DIAGNOSIS — R300 Dysuria: Secondary | ICD-10-CM

## 2015-01-07 DIAGNOSIS — D649 Anemia, unspecified: Secondary | ICD-10-CM | POA: Diagnosis not present

## 2015-01-07 DIAGNOSIS — D509 Iron deficiency anemia, unspecified: Secondary | ICD-10-CM | POA: Diagnosis not present

## 2015-01-07 LAB — URINALYSIS, ROUTINE W REFLEX MICROSCOPIC
Bilirubin Urine: NEGATIVE
Glucose, UA: NEGATIVE mg/dL
Hgb urine dipstick: NEGATIVE
Ketones, ur: NEGATIVE mg/dL
Leukocytes, UA: NEGATIVE
Nitrite: NEGATIVE
Protein, ur: NEGATIVE mg/dL
Specific Gravity, Urine: <1.005 — ABNORMAL LOW (ref 1.005–1.030)
UROBILINOGEN UA: 0.2 mg/dL (ref 0.0–1.0)
pH: 5.5 (ref 5.0–8.0)

## 2015-01-07 NOTE — Progress Notes (Signed)
[name], MD [place] [address]   DIAGNOSIS: Iron deficiency Anemia Delayed gastric emptying on pill cam, normal gastric emptying scan on 05/22/2014 Capsule Endoscopy 10/10/2013 no  AVMS, ulcerations, mass  EGD 11/09/2012 history of gastric erosions, history of H pylori and chronic gastritis Colonoscopy 11/09/2012 with polyps  CURRENT THERAPY: Skipper Cliche last given on 01/10/2014  INTERVAL HISTORY: WOOD NOVACEK 64 y.o. male returns for iron deficiency anemia.  He says he is doing well today.  He has a few old cars that he "fools with."  He is actively trying to quit chewing tobacco. He is now on his 6th day without any.  He feels that this is very important.   He is complaining of a strange feels at the end of urination. He says it is not burning but "tingling". He has occasional sweats that occur during the day.    MEDICAL HISTORY: Past Medical History  Diagnosis Date  . Diabetes mellitus   . Depression   . HTN (hypertension)     Cholesterol  . Dextrocardia   . H. pylori infection 11/09/2012    treated with pylera  . Iron deficiency anemia, unspecified 10/18/2012    has SPINAL STENOSIS; TENDINITIS, CALCIFIC, SHOULDER, RIGHT; IMPINGEMENT SYNDROME; HIP PAIN; GERD (gastroesophageal reflux disease); Nausea; Iron deficiency anemia due to chronic blood loss; Gastritis, Helicobacter pylori; Insulin-requiring or dependent type II diabetes mellitus; Malabsorption of iron; Abdominal pain; and Thrombocytosis on his problem list.     has No Known Allergies.  Mr. Enslin does not currently have medications on file.  SURGICAL HISTORY: Past Surgical History  Procedure Laterality Date  . Neck surgery      bone spurs  . Colonoscopy with esophagogastroduodenoscopy (egd) N/A 11/09/2012    Dr. 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 hpylori on bx- treated with pylera. TCS- normal  rectum. 1 diminutive polyp in the mid descending segment. 1-34mm polyp in the mid desending segment o/w the remainder of the colonic mucosa appeared normal. tubular adenoma on bx  . Givens capsule study N/A 10/07/2013    no source for anemia or heme positive stool noted    SOCIAL HISTORY: History   Social History  . Marital Status: Married    Spouse Name: N/A  . Number of Children: 0  . 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 Smoking status: Never Smoker   . Smokeless tobacco: Current User    Types: Chew  . Alcohol Use: No  . Drug Use: No  . Sexual Activity: Not on file   Other Topics Concern  . Not on file   Social History Narrative    FAMILY HISTORY: Family History  Problem Relation Age of Onset  . Diabetes Sister     Fx  . Heart defect Sister     Fx  . Arthritis Sister     Fx  . Asthma Sister     Fx  . Kidney disease Sister     Fx  . Pancreatitis Sister     Review of Systems  Constitutional: Negative.   HENT: Negative.   Eyes: Negative.   Respiratory: Negative.   Cardiovascular: Negative.   Gastrointestinal: Negative.   Genitourinary: Negative.   Musculoskeletal: Positive for joint pain.       Leg pain, uses a cane  Skin: Negative.   Neurological:  Negative.   Endo/Heme/Allergies: Negative.   Psychiatric/Behavioral: Negative.     PHYSICAL EXAMINATION  ECOG PERFORMANCE STATUS: 1 - Symptomatic but completely ambulatory  There were no vitals filed for this visit.  Physical Exam  Constitutional: He is oriented to person, place, and time. No distress.  obese  HENT:  Head: Normocephalic and atraumatic.  Mouth/Throat: Oropharynx is clear and moist. No oropharyngeal exudate.  Eyes: Conjunctivae and EOM are normal. Pupils are equal, round, and reactive to light. No scleral icterus.  Neck: Normal range of motion. Neck supple. No JVD present. No tracheal deviation present. No thyromegaly  present.  Cardiovascular: Normal rate, regular rhythm, normal heart sounds and intact distal pulses.  Exam reveals no gallop and no friction rub.   No murmur heard. Pulmonary/Chest: Effort normal. No stridor. No respiratory distress. He has wheezes.  Coarse rhonchi  Abdominal: Soft. Bowel sounds are normal. He exhibits no mass. There is no tenderness. There is no rebound and no guarding.  Large abdominal hernia, easily reduced  Musculoskeletal: Normal range of motion. He exhibits no edema.  Lymphadenopathy:    He has no cervical adenopathy.  Neurological: He is alert and oriented to person, place, and time. No cranial nerve deficit. Coordination normal.  Skin: Skin is warm and dry. He is not diaphoretic.  Psychiatric: Memory and judgment normal.    LABORATORY DATA:  CBC    Component Value Date/Time   WBC 10.1 01/05/2015 1222   RBC 4.27 01/05/2015 1222   HGB 12.8* 01/05/2015 1222   HCT 38.7* 01/05/2015 1222   PLT 388 01/05/2015 1222   MCV 90.6 01/05/2015 1222   MCH 30.0 01/05/2015 1222   MCHC 33.1 01/05/2015 1222   RDW 12.8 01/05/2015 1222   LYMPHSABS 2.8 01/05/2015 1222   MONOABS 0.7 01/05/2015 1222   EOSABS 0.1 01/05/2015 1222   BASOSABS 0.0 01/05/2015 1222   CMP     Component Value Date/Time   NA 138 01/05/2015 1222   K 4.3 01/05/2015 1222   CL 102 01/05/2015 1222   CO2 27 01/05/2015 1222   GLUCOSE 193* 01/05/2015 1222   BUN 9 01/05/2015 1222   CREATININE 0.80 01/05/2015 1222   CREATININE 0.93 08/30/2013 1059   CALCIUM 9.8 01/05/2015 1222   PROT 7.3 01/05/2015 1222   PROT 6.9 09/18/2012   ALBUMIN 4.3 01/05/2015 1222   AST 25 01/05/2015 1222   AST 17 09/18/2012   ALT 19 01/05/2015 1222   ALKPHOS 40 01/05/2015 1222   ALKPHOS 42 09/18/2012   BILITOT 0.4 01/05/2015 1222   BILITOT 0.3 09/18/2012   GFRNONAA >90 01/05/2015 1222   GFRAA >90 01/05/2015 1222       ASSESSMENT and THERAPY PLAN:   Anemia Iron deficiency No history of BMBX Diabetes  Last iron  infusion was in 5/20115. He has not required any since.  His H/H are slightly improved to stable. I have currently recommended ongoing observation. I continue to encourage the patient is to discontinue his chewing tobacco use. He is well advised of the risk of head and neck cancer with ongoing tobacco use.  We will see him back in 4 months with repeat laboratory studies.  Orders Placed This Encounter  Procedures  . Urinalysis, Routine w reflex microscopic    Standing Status: Future     Number of Occurrences: 1     Standing Expiration Date: 01/07/2016  . CBC with Differential    Standing Status: Future     Number of Occurrences:  Standing Expiration Date: 01/07/2016  . Comprehensive metabolic panel    Standing Status: Future     Number of Occurrences:      Standing Expiration Date: 01/07/2016  . Sedimentation rate    Standing Status: Future     Number of Occurrences:      Standing Expiration Date: 01/07/2016  . C-reactive protein    Standing Status: Future     Number of Occurrences:      Standing Expiration Date: 01/07/2016  . Ferritin    Standing Status: Future     Number of Occurrences:      Standing Expiration Date: 01/07/2016  . Iron and TIBC    Standing Status: Future     Number of Occurrences:      Standing Expiration Date: 01/07/2016  . Haptoglobin    Standing Status: Future     Number of Occurrences:      Standing Expiration Date: 01/07/2016    All questions were answered. The patient knows to call the clinic with any problems, questions or concerns. We can certainly see the patient much sooner if necessary.  Molli Hazard MD 01/07/2015

## 2015-01-07 NOTE — Patient Instructions (Signed)
..  [place] at [facility] Discharge Instructions  RECOMMENDATIONS MADE BY THE CONSULTANT AND ANY TEST RESULTS WILL BE SENT TO YOUR REFERRING PHYSICIAN.  Exam and discussion with Dr. Whitney Muse. Will call with results of urine test. Return in 4 months for lab work and to see PA. Call for any new symptoms, questions, or concerns.   Thank you for choosing [place] at [facility] to provide your oncology and hematology care.  To afford each patient quality time with our provider, please arrive at least 15 minutes before your scheduled appointment time.    You need to re-schedule your appointment should you arrive 10 or more minutes late.  We strive to give you quality time with our providers, and arriving late affects you and other patients whose appointments are after yours.  Also, if you no show three or more times for appointments you may be dismissed from the clinic at the providers discretion.     Again, thank you for choosing [facility].  Our hope is that these requests will decrease the amount of time that you wait before being seen by our physicians.       _____________________________________________________________  Should you have questions after your visit to [facility], please contact our office at [phone] between the hours of 8:30 a.m. and 4:30 p.m.  Voicemails left after 4:30 p.m. will not be returned until the following business day.  For prescription refill requests, have your pharmacy contact our office.

## 2015-01-08 ENCOUNTER — Encounter: Payer: Self-pay | Admitting: Internal Medicine

## 2015-02-01 ENCOUNTER — Encounter (HOSPITAL_COMMUNITY): Payer: Self-pay | Admitting: Hematology & Oncology

## 2015-03-11 ENCOUNTER — Ambulatory Visit (HOSPITAL_COMMUNITY): Payer: Managed Care, Other (non HMO) | Admitting: Oncology

## 2015-05-11 ENCOUNTER — Encounter (HOSPITAL_COMMUNITY): Payer: Managed Care, Other (non HMO) | Attending: Oncology

## 2015-05-11 DIAGNOSIS — R300 Dysuria: Secondary | ICD-10-CM | POA: Diagnosis not present

## 2015-05-11 DIAGNOSIS — D649 Anemia, unspecified: Secondary | ICD-10-CM | POA: Diagnosis present

## 2015-05-11 DIAGNOSIS — D509 Iron deficiency anemia, unspecified: Secondary | ICD-10-CM | POA: Diagnosis not present

## 2015-05-11 LAB — CBC WITH DIFFERENTIAL/PLATELET
BASOS ABS: 0 10*3/uL (ref 0.0–0.1)
Basophils Relative: 0 % (ref 0–1)
EOS PCT: 1 % (ref 0–5)
Eosinophils Absolute: 0.1 10*3/uL (ref 0.0–0.7)
HEMATOCRIT: 38.5 % — AB (ref 39.0–52.0)
Hemoglobin: 12.6 g/dL — ABNORMAL LOW (ref 13.0–17.0)
LYMPHS PCT: 30 % (ref 12–46)
Lymphs Abs: 3.4 10*3/uL (ref 0.7–4.0)
MCH: 29.9 pg (ref 26.0–34.0)
MCHC: 32.7 g/dL (ref 30.0–36.0)
MCV: 91.4 fL (ref 78.0–100.0)
MONOS PCT: 6 % (ref 3–12)
Monocytes Absolute: 0.7 10*3/uL (ref 0.1–1.0)
Neutro Abs: 7.2 10*3/uL (ref 1.7–7.7)
Neutrophils Relative %: 63 % (ref 43–77)
PLATELETS: 405 10*3/uL — AB (ref 150–400)
RBC: 4.21 MIL/uL — ABNORMAL LOW (ref 4.22–5.81)
RDW: 12.7 % (ref 11.5–15.5)
WBC: 11.4 10*3/uL — ABNORMAL HIGH (ref 4.0–10.5)

## 2015-05-11 LAB — COMPREHENSIVE METABOLIC PANEL
ALT: 15 U/L — ABNORMAL LOW (ref 17–63)
ANION GAP: 8 (ref 5–15)
AST: 18 U/L (ref 15–41)
Albumin: 4.2 g/dL (ref 3.5–5.0)
Alkaline Phosphatase: 46 U/L (ref 38–126)
BILIRUBIN TOTAL: 0.4 mg/dL (ref 0.3–1.2)
BUN: 9 mg/dL (ref 6–20)
CO2: 28 mmol/L (ref 22–32)
Calcium: 9.3 mg/dL (ref 8.9–10.3)
Chloride: 102 mmol/L (ref 101–111)
Creatinine, Ser: 0.84 mg/dL (ref 0.61–1.24)
Glucose, Bld: 137 mg/dL — ABNORMAL HIGH (ref 65–99)
POTASSIUM: 4.2 mmol/L (ref 3.5–5.1)
Sodium: 138 mmol/L (ref 135–145)
TOTAL PROTEIN: 7.5 g/dL (ref 6.5–8.1)

## 2015-05-11 LAB — IRON AND TIBC
Iron: 83 ug/dL (ref 45–182)
Saturation Ratios: 16 % — ABNORMAL LOW (ref 17.9–39.5)
TIBC: 508 ug/dL — ABNORMAL HIGH (ref 250–450)
UIBC: 425 ug/dL

## 2015-05-11 LAB — FERRITIN: FERRITIN: 92 ng/mL (ref 24–336)

## 2015-05-11 LAB — C-REACTIVE PROTEIN: CRP: <0.5 mg/dL (ref <1.0)

## 2015-05-11 LAB — SEDIMENTATION RATE: SED RATE: 25 mm/h — AB (ref 0–16)

## 2015-05-12 LAB — HAPTOGLOBIN: Haptoglobin: 430 mg/dL — ABNORMAL HIGH (ref 34–200)

## 2015-05-12 NOTE — Progress Notes (Signed)
LABS DRAWN

## 2015-05-13 ENCOUNTER — Encounter (HOSPITAL_BASED_OUTPATIENT_CLINIC_OR_DEPARTMENT_OTHER): Payer: Managed Care, Other (non HMO) | Admitting: Oncology

## 2015-05-13 ENCOUNTER — Encounter (HOSPITAL_COMMUNITY): Payer: Self-pay | Admitting: Oncology

## 2015-05-13 VITALS — BP 166/57 | HR 88 | Temp 97.9°F | Resp 18 | Wt 246.0 lb

## 2015-05-13 DIAGNOSIS — E119 Type 2 diabetes mellitus without complications: Secondary | ICD-10-CM

## 2015-05-13 DIAGNOSIS — D500 Iron deficiency anemia secondary to blood loss (chronic): Secondary | ICD-10-CM | POA: Diagnosis not present

## 2015-05-13 DIAGNOSIS — G622 Polyneuropathy due to other toxic agents: Secondary | ICD-10-CM | POA: Diagnosis not present

## 2015-05-13 DIAGNOSIS — N23 Unspecified renal colic: Secondary | ICD-10-CM | POA: Diagnosis not present

## 2015-05-13 DIAGNOSIS — D649 Anemia, unspecified: Secondary | ICD-10-CM | POA: Diagnosis not present

## 2015-05-13 DIAGNOSIS — R300 Dysuria: Secondary | ICD-10-CM

## 2015-05-13 LAB — URINALYSIS, ROUTINE W REFLEX MICROSCOPIC
BILIRUBIN URINE: NEGATIVE
Glucose, UA: >1000 mg/dL — AB
Hgb urine dipstick: NEGATIVE
Ketones, ur: NEGATIVE mg/dL
LEUKOCYTES UA: NEGATIVE
NITRITE: NEGATIVE
Protein, ur: NEGATIVE mg/dL
UROBILINOGEN UA: 0.2 mg/dL (ref 0.0–1.0)
pH: 5.5 (ref 5.0–8.0)

## 2015-05-13 LAB — URINE MICROSCOPIC-ADD ON

## 2015-05-13 NOTE — Patient Instructions (Signed)
..  [place] at [facility] Discharge Instructions  RECOMMENDATIONS MADE BY THE CONSULTANT AND ANY TEST RESULTS WILL BE SENT TO YOUR REFERRING PHYSICIAN.  Exam today per T. Kefalas  Urinalysis today  Iron infusion to be arranged  Labs in 3 months Return in 3 months  F?U with your primary care doctor regularly for treatment of your neuropathy  Thank you for choosing [place] at [facility] to provide your oncology and hematology care.  To afford each patient quality time with our provider, please arrive at least 15 minutes before your scheduled appointment time.    You need to re-schedule your appointment should you arrive 10 or more minutes late.  We strive to give you quality time with our providers, and arriving late affects you and other patients whose appointments are after yours.  Also, if you no show three or more times for appointments you may be dismissed from the clinic at the providers discretion.     Again, thank you for choosing [facility].  Our hope is that these requests will decrease the amount of time that you wait before being seen by our physicians.       _____________________________________________________________  Should you have questions after your visit to [facility], please contact our office at [phone] between the hours of 8:30 a.m. and 4:30 p.m.  Voicemails left after 4:30 p.m. will not be returned until the following business day.  For prescription refill requests, have your pharmacy contact our office.

## 2015-05-13 NOTE — Assessment & Plan Note (Addendum)
Iron deficiency anemia in the setting of delayed gastric emptying on pill cam, normal gastric emptying scan on 05/22/2014, capsule endoscopy 10/10/2013 no AVMS, ulcerations, mass, EGD 11/09/2012 history of gastric erosions, history of H. pylori and chronic gastritis, colonoscopy 11/09/2012 with polyps.  I personally reviewed and went over laboratory results with the patient.  The results are noted within this dictation.  Hgb is stable at 12.6 g/dL, ferritin is less than 100 at 92, and TIBC is elevated at 508.  ESR is mildly elevated.  Given his lab results, I have to wonder if his ferritin is falsely higher than reported given his elevated ESR and elevated TIBC.  Hgb is stable however.  On my calculation, his iron deficit is small at about 100.  Given the aforementioned information, along with a minimally elevated platelet count that may be reactive (to iron deficiency), I think it is prudent to administer 125 mg of IV ferric gluconate in the near future.  His urinary complaints will be further evaluated with a UA today.  He notes that his is drinking a lot of H2O.  He is a diabetic.  He notes that Dr. Everette Rank has altered his insulin dose.  He thinks his glucose has been averaging in the 150's.  His peripheral neuropathy has been ongoing for many months.  He reports that it is at baseline when compared to 2-3 months ago.  I have recommended he follow-up with his primary care provider in the near future.  Labs in 3 months: CBC diff, BMET, iron/TIBC, LDH, ESR, CRP.  IV Ferric gluconate 125 mg signed and held.  Return in 3 months for follow-up.

## 2015-05-13 NOTE — Progress Notes (Signed)
Brett Hampshire, MD [place] [address]  Iron deficiency anemia due to chronic blood loss - Plan: CBC with Differential, Basic metabolic panel, Lactate dehydrogenase, Sedimentation rate, Iron and TIBC, Ferritin, C-reactive protein, ferric gluconate (NULECIT) 125 mg in sodium chloride 0.9 % 100 mL IVPB  CURRENT THERAPY: Intermittent IV iron.  INTERVAL HISTORY: Brett [name] 64 y.o. male returns for followup of iron deficiency anemia in the setting of delayed gastric emptying on pill cam, normal gastric emptying scan on 05/22/2014, capsule endoscopy 10/10/2013 no AVMS, ulcerations, mass,  EGD 11/09/2012 history of gastric erosions, history of H. pylori and chronic gastritis, colonoscopy 11/09/2012 with polyps.  I personally reviewed and went over laboratory results with the patient.  The results are noted within this dictation.  Hgb is stable at 12.6 g/dL, ferritin is less than 100 at 92, and TIBC is elevated at 508.  ESR is mildly elevated.  Given his lab results, I have to wonder if his ferritin is falsely higher than reported given his elevated ESR and elevated TIBC.  Hgb is stable however.  On my calculation, his iron deficit is small at about 100.  He notes issues with walking.  He is using a cane for support.  I personally reviewed and went over radiographic studies with the patient.  The results are noted within this dictation.  MRI of brian in 2012 reports findings of chronic microvascular ischemia.  He notes some pain/burning with urination.     Past Medical History  Diagnosis Date  . Diabetes mellitus   . Depression   . HTN (hypertension)     Cholesterol  . Dextrocardia   . H. pylori infection 11/09/2012    treated with pylera  . Iron deficiency anemia, unspecified 10/18/2012    has SPINAL STENOSIS; TENDINITIS, CALCIFIC, SHOULDER, RIGHT; IMPINGEMENT SYNDROME; HIP PAIN; GERD (gastroesophageal reflux disease); Nausea; Iron deficiency anemia due to  chronic blood loss; Gastritis, Helicobacter pylori; Insulin-requiring or dependent type II diabetes mellitus; Malabsorption of iron; Abdominal pain; and Thrombocytosis on his problem list.     has No Known Allergies.  Current Outpatient Prescriptions on File Prior to Visit  Medication Sig Dispense Refill  . DEXILANT 60 MG capsule TAKE (1) CAPSULE BY MOUTH EVERY DAY. 30 capsule 11  . DULoxetine (CYMBALTA) 60 MG capsule Take 60 mg by mouth daily.    . fenofibrate (TRICOR) 145 MG tablet Take 145 mg by mouth daily.    . folic acid (FOLVITE) 1 MG tablet Take 1 mg by mouth daily.    Marland Kitchen gabapentin (NEURONTIN) 100 MG capsule Take 100-200 mg by mouth 3 (three) times daily. Prescribed one capsule in the morning and at noon, then take two capsules at bedtime    . insulin detemir (LEVEMIR) 100 UNIT/ML injection Inject 55 Units into the skin at bedtime.     Marland Kitchen LORazepam (ATIVAN) 1 MG tablet Take 1 mg by mouth every 4 (four) hours as needed. For nerves    . metFORMIN (GLUCOPHAGE) 1000 MG tablet Take 1,000 mg by mouth 2 (two) times daily with a meal.    . metoCLOPramide (REGLAN) 5 MG tablet Take 1-2 tablets (5-10 mg total) by mouth 3 (three) times daily as needed. Take 1 or 2 tablets 3 times a day before meals and at bedtime 100 tablet 3  . Multiple Vitamin (MULTIVITAMIN WITH MINERALS) TABS tablet Take 1 tablet by mouth daily.    . pioglitazone (ACTOS) 45 MG tablet Take  45 mg by mouth daily.    . rosuvastatin (CRESTOR) 20 MG tablet Take 20 mg by mouth at bedtime.     . traMADol (ULTRAM) 50 MG tablet Take 50-100 mg by mouth every 6 (six) hours as needed. For pain    . chlorpheniramine-HYDROcodone (TUSSIONEX PENNKINETIC ER) 10-8 MG/5ML LQCR Take 5 mLs by mouth every 12 (twelve) hours as needed for cough. (Patient not taking: Reported on 01/07/2015) 115 mL 0   No current facility-administered medications on file prior to visit.    Past Surgical History  Procedure Laterality Date  . Neck surgery      bone spurs   . Colonoscopy with esophagogastroduodenoscopy (egd) N/A 11/09/2012    Dr. 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 hpylori on bx- treated with pylera. TCS- normal rectum. 1 diminutive polyp in the mid descending segment. 1-29m polyp in the mid desending segment o/w the remainder of the colonic mucosa appeared normal. tubular adenoma on bx  . Givens capsule study N/A 10/07/2013    no source for anemia or heme positive stool noted    Denies any headaches, dizziness, double vision, fevers, chills, night sweats, nausea, vomiting, diarrhea, constipation, chest pain, heart palpitations, shortness of breath, blood in stool, black tarry stool, hematuria.   PHYSICAL EXAMINATION  ECOG PERFORMANCE STATUS: 1 - Symptomatic but completely ambulatory  Filed Vitals:   05/13/15 1328  BP: 166/57  Pulse: 88  Temp: 97.9 F (36.6 C)  Resp: 18    GENERAL:alert, no distress, comfortable, cooperative, obese, smiling and accompanied by his wife. SKIN: skin color, texture, turgor are normal, no rashes or significant lesions HEAD: Normocephalic, No masses, lesions, tenderness or abnormalities EYES: normal, PERRLA, EOMI, Conjunctiva are pink and non-injected EARS: External ears normal OROPHARYNX:lips, buccal mucosa, and tongue normal and mucous membranes are moist  NECK: supple, trachea midline LYMPH:  no palpable lymphadenopathy BREAST:not examined LUNGS: clear to auscultation  HEART: regular rate & rhythm, no murmurs, no gallops, S1 normal and S2 normal ABDOMEN:abdomen soft, non-tender, obese and normal bowel sounds BACK: Back symmetric, no curvature. EXTREMITIES:less then 2 second capillary refill, no joint deformities, effusion, or inflammation, no skin discoloration, no cyanosis  NEURO: alert & oriented x 3 with fluent speech, no focal motor/sensory deficits, using cane to help with gait.    LABORATORY  DATA: CBC    Component Value Date/Time   WBC 11.4* 05/11/2015 1029   RBC 4.21* 05/11/2015 1029   HGB 12.6* 05/11/2015 1029   HCT 38.5* 05/11/2015 1029   PLT 405* 05/11/2015 1029   MCV 91.4 05/11/2015 1029   MCH 29.9 05/11/2015 1029   MCHC 32.7 05/11/2015 1029   RDW 12.7 05/11/2015 1029   LYMPHSABS 3.4 05/11/2015 1029   MONOABS 0.7 05/11/2015 1029   EOSABS 0.1 05/11/2015 1029   BASOSABS 0.0 05/11/2015 1029      Chemistry      Component Value Date/Time   NA 138 05/11/2015 1029   K 4.2 05/11/2015 1029   CL 102 05/11/2015 1029   CO2 28 05/11/2015 1029   BUN 9 05/11/2015 1029   CREATININE 0.84 05/11/2015 1029   CREATININE 0.93 08/30/2013 1059   GLU 121 09/18/2012      Component Value Date/Time   CALCIUM 9.3 05/11/2015 1029   ALKPHOS 46 05/11/2015 1029   ALKPHOS 42 09/18/2012   AST 18 05/11/2015 1029   AST 17 09/18/2012   ALT 15* 05/11/2015 1029   BILITOT 0.4 05/11/2015 1029  BILITOT 0.3 09/18/2012     Lab Results  Component Value Date   IRON 83 05/11/2015   TIBC 508* 05/11/2015   FERRITIN 92 05/11/2015      PENDING LABS:   RADIOGRAPHIC STUDIES:  No results found.   PATHOLOGY:    ASSESSMENT AND PLAN:  Iron deficiency anemia due to chronic blood loss Iron deficiency anemia in the setting of delayed gastric emptying on pill cam, normal gastric emptying scan on 05/22/2014, capsule endoscopy 10/10/2013 no AVMS, ulcerations, mass, EGD 11/09/2012 history of gastric erosions, history of H. pylori and chronic gastritis, colonoscopy 11/09/2012 with polyps.  I personally reviewed and went over laboratory results with the patient.  The results are noted within this dictation.  Hgb is stable at 12.6 g/dL, ferritin is less than 100 at 92, and TIBC is elevated at 508.  ESR is mildly elevated.  Given his lab results, I have to wonder if his ferritin is falsely higher than reported given his elevated ESR and elevated TIBC.  Hgb is stable however.  On my calculation, his  iron deficit is small at about 100.  Given the aforementioned information, along with a minimally elevated platelet count that may be reactive (to iron deficiency), I think it is prudent to administer 125 mg of IV ferric gluconate in the near future.  His urinary complaints will be further evaluated with a UA today.  He notes that his is drinking a lot of H2O.  He is a diabetic.  He notes that Dr. Everette Rank has altered his insulin dose.  He thinks his glucose has been averaging in the 150's.  His peripheral neuropathy has been ongoing for many months.  He reports that it is at baseline when compared to 2-3 months ago.  I have recommended he follow-up with his primary care provider in the near future.  Labs in 3 months: CBC diff, BMET, iron/TIBC, LDH, ESR, CRP.  IV Ferric gluconate 125 mg signed and held.  Return in 3 months for follow-up.     THERAPY PLAN:  Will administer IV iron in the near future and continue to monitor his blood counts and iron studies.  All questions were answered. The patient knows to call the clinic with any problems, questions or concerns. We can certainly see the patient much sooner if necessary.  Patient and plan discussed with Dr. Ancil Linsey and she is in agreement with the aforementioned.   This note is electronically signed by: Robynn Pane, PA-C 05/13/2015 2:10 PM

## 2015-05-14 ENCOUNTER — Telehealth (HOSPITAL_COMMUNITY): Payer: Self-pay | Admitting: Oncology

## 2015-05-14 NOTE — Telephone Encounter (Signed)
PC TO [employer] RE FERRLICT PER CSR AUTH IS NOT REQUIRED REF# [id number]

## 2015-05-26 ENCOUNTER — Ambulatory Visit (HOSPITAL_COMMUNITY): Payer: Managed Care, Other (non HMO)

## 2015-05-29 ENCOUNTER — Ambulatory Visit (HOSPITAL_COMMUNITY): Payer: Managed Care, Other (non HMO)

## 2015-05-29 MED ORDER — SODIUM CHLORIDE 0.9 % IV SOLN: 125.0000 mg | Freq: Once | INTRAVENOUS | Status: DC

## 2015-05-29 MED ORDER — SODIUM CHLORIDE 0.9 % IV SOLN: INTRAVENOUS | Status: DC

## 2015-06-08 ENCOUNTER — Encounter (HOSPITAL_COMMUNITY): Payer: Self-pay

## 2015-06-11 ENCOUNTER — Encounter (HOSPITAL_COMMUNITY): Payer: Self-pay

## 2015-06-11 ENCOUNTER — Encounter (HOSPITAL_COMMUNITY): Payer: Managed Care, Other (non HMO) | Attending: Oncology

## 2015-06-11 DIAGNOSIS — D500 Iron deficiency anemia secondary to blood loss (chronic): Secondary | ICD-10-CM

## 2015-06-11 DIAGNOSIS — D649 Anemia, unspecified: Secondary | ICD-10-CM | POA: Insufficient documentation

## 2015-06-11 DIAGNOSIS — R300 Dysuria: Secondary | ICD-10-CM | POA: Insufficient documentation

## 2015-06-11 MED ORDER — SODIUM CHLORIDE 0.9 % IJ SOLN: 10.0000 mL | Freq: Once | INTRAMUSCULAR | Status: DC

## 2015-06-11 MED ORDER — SODIUM CHLORIDE 0.9 % IV SOLN
Freq: Once | INTRAVENOUS | Status: AC
Start: 2015-06-11 — End: 2015-06-11
  Administered 2015-06-11: 12:00:00 via INTRAVENOUS

## 2015-06-11 MED ORDER — SODIUM CHLORIDE 0.9 % IV SOLN
125.0000 mg | Freq: Once | INTRAVENOUS | Status: AC
  Administered 2015-06-11: 125 mg via INTRAVENOUS
  Filled 2015-06-11: qty 10

## 2015-06-11 NOTE — Progress Notes (Signed)
.  Brett [name] arrives today for iron infusion. Tolerated well

## 2015-06-11 NOTE — Patient Instructions (Signed)
..  [place] at [facility] Discharge Instructions  RECOMMENDATIONS MADE BY THE CONSULTANT AND ANY TEST RESULTS WILL BE SENT TO YOUR REFERRING PHYSICIAN.  Iron infusion today of ferric gluconate Return as scheduled for labs and dr. 's visit  Thank you for choosing [place] at [facility] to provide your oncology and hematology care.  To afford each patient quality time with our Mckinnley Smithey, please arrive at least 15 minutes before your scheduled appointment time.    You need to re-schedule your appointment should you arrive 10 or more minutes late.  We strive to give you quality time with our providers, and arriving late affects you and other patients whose appointments are after yours.  Also, if you no show three or more times for appointments you may be dismissed from the clinic at the providers discretion.     Again, thank you for choosing [facility].  Our hope is that these requests will decrease the amount of time that you wait before being seen by our physicians.       _____________________________________________________________  Should you have questions after your visit to [facility], please contact our office at [phone] between the hours of 8:30 a.m. and 4:30 p.m.  Voicemails left after 4:30 p.m. will not be returned until the following business day.  For prescription refill requests, have your pharmacy contact our office.

## 2015-08-11 NOTE — Progress Notes (Signed)
Brett Hampshire, MD [place] [address]  Iron deficiency anemia due to chronic blood loss - Plan: CBC with Differential, Iron and TIBC, Ferritin, C-reactive protein  CURRENT THERAPY: Intermittent IV iron.  INTERVAL HISTORY: Brett [name] 64 y.o. male returns for followup of iron deficiency anemia in the setting of delayed gastric emptying on pill cam, normal gastric emptying scan on 05/22/2014, capsule endoscopy 10/10/2013 no AVMS, ulcerations, mass,  EGD 11/09/2012 history of gastric erosions, history of H. pylori and chronic gastritis, colonoscopy 11/09/2012 with polyps.  I personally reviewed and went over laboratory results with the patient.  The results are noted within this dictation.  Some are being updated and we will get results tomorrow with further recommendations.  He denies any blood loss that he can identify.     He denies any complaints today.  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  His DM is managed by his primary care provider, Dr. Everette Rank.  Past Medical History  Diagnosis Date  . Diabetes mellitus   . Depression   . HTN (hypertension)     Cholesterol  . Dextrocardia   . H. pylori infection 11/09/2012    treated with pylera  . Iron deficiency anemia, unspecified 10/18/2012    has SPINAL STENOSIS; TENDINITIS, CALCIFIC, SHOULDER, RIGHT; IMPINGEMENT SYNDROME; HIP PAIN; GERD (gastroesophageal reflux disease); Nausea; Iron deficiency anemia due to chronic blood loss; Gastritis, Helicobacter pylori; Insulin-requiring or dependent type II diabetes mellitus ([place]); Malabsorption of iron; Abdominal pain; and Thrombocytosis ([place]) on his problem list.     has No Known Allergies.  Current Outpatient Prescriptions on File Prior to Visit  Medication Sig Dispense Refill  . DEXILANT 60 MG capsule TAKE (1) CAPSULE BY MOUTH EVERY DAY. 30 capsule 11  . DULoxetine (CYMBALTA) 60 MG capsule Take 60  mg by mouth daily.    . fenofibrate (TRICOR) 145 MG tablet Take 145 mg by mouth daily.    . folic acid (FOLVITE) 1 MG tablet Take 1 mg by mouth daily.    . insulin detemir (LEVEMIR) 100 UNIT/ML injection Inject 55 Units into the skin at bedtime.     Marland Kitchen LORazepam (ATIVAN) 1 MG tablet Take 1 mg by mouth every 4 (four) hours as needed. For nerves    . metFORMIN (GLUCOPHAGE) 1000 MG tablet Take 1,000 mg by mouth 2 (two) times daily with a meal.    . metoCLOPramide (REGLAN) 5 MG tablet Take 1-2 tablets (5-10 mg total) by mouth 3 (three) times daily as needed. Take 1 or 2 tablets 3 times a day before meals and at bedtime 100 tablet 3  . Multiple Vitamin (MULTIVITAMIN WITH MINERALS) TABS tablet Take 1 tablet by mouth daily.    . pioglitazone (ACTOS) 45 MG tablet Take 45 mg by mouth daily.    . rosuvastatin (CRESTOR) 20 MG tablet Take 20 mg by mouth at bedtime.     . traMADol (ULTRAM) 50 MG tablet Take 50-100 mg by mouth every 6 (six) hours as needed. For pain    . chlorpheniramine-HYDROcodone (TUSSIONEX PENNKINETIC ER) 10-8 MG/5ML LQCR Take 5 mLs by mouth every 12 (twelve) hours as needed for cough. (Patient not taking: Reported on 01/07/2015) 115 mL 0   No current facility-administered medications on file prior to visit.    Past Surgical History  Procedure Laterality Date  . Neck surgery      bone spurs  .  Colonoscopy with esophagogastroduodenoscopy (egd) N/A 11/09/2012    Dr. 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 hpylori on bx- treated with pylera. TCS- normal rectum. 1 diminutive polyp in the mid descending segment. 1-29m polyp in the mid desending segment o/w the remainder of the colonic mucosa appeared normal. tubular adenoma on bx  . Givens capsule study N/A 10/07/2013    no source for anemia or heme positive stool noted    Denies any headaches, dizziness, double vision, fevers, chills, night sweats,  nausea, vomiting, diarrhea, constipation, chest pain, heart palpitations, shortness of breath, blood in stool, black tarry stool, hematuria.   PHYSICAL EXAMINATION  ECOG PERFORMANCE STATUS: 1 - Symptomatic but completely ambulatory  Filed Vitals:   08/12/15 1301  BP: 166/58  Pulse: 81  Temp: 97.8 F (36.6 C)  Resp: 18    GENERAL:alert, no distress, comfortable, cooperative, obese, smiling and accompanied by his wife. SKIN: skin color, texture, turgor are normal, no rashes or significant lesions HEAD: Normocephalic, No masses, lesions, tenderness or abnormalities EYES: normal, PERRLA, EOMI, Conjunctiva are pink and non-injected EARS: External ears normal OROPHARYNX:lips, buccal mucosa, and tongue normal and mucous membranes are moist  NECK: supple, trachea midline LYMPH:  no palpable lymphadenopathy BREAST:not examined LUNGS: clear to auscultation  HEART: regular rate & rhythm, no murmurs, no gallops, S1 normal and S2 normal ABDOMEN:abdomen soft, non-tender, obese and normal bowel sounds BACK: Back symmetric, no curvature. EXTREMITIES:less then 2 second capillary refill, no joint deformities, effusion, or inflammation, no skin discoloration, no cyanosis  NEURO: alert & oriented x 3 with fluent speech, no focal motor/sensory deficits, using cane to help with gait.   LABORATORY DATA: CBC    Component Value Date/Time   WBC 11.4* 08/12/2015 1257   RBC 3.98* 08/12/2015 1257   HGB 11.9* 08/12/2015 1257   HCT 36.1* 08/12/2015 1257   PLT 397 08/12/2015 1257   MCV 90.7 08/12/2015 1257   MCH 29.9 08/12/2015 1257   MCHC 33.0 08/12/2015 1257   RDW 12.6 08/12/2015 1257   LYMPHSABS 3.3 08/12/2015 1257   MONOABS 0.7 08/12/2015 1257   EOSABS 0.1 08/12/2015 1257   BASOSABS 0.0 08/12/2015 1257      Chemistry      Component Value Date/Time   NA 137 08/12/2015 1257   K 3.9 08/12/2015 1257   CL 99* 08/12/2015 1257   CO2 28 08/12/2015 1257   BUN 8 08/12/2015 1257   CREATININE 0.83  08/12/2015 1257   CREATININE 0.93 08/30/2013 1059   GLU 121 09/18/2012      Component Value Date/Time   CALCIUM 9.9 08/12/2015 1257   ALKPHOS 46 05/11/2015 1029   ALKPHOS 42 09/18/2012   AST 18 05/11/2015 1029   AST 17 09/18/2012   ALT 15* 05/11/2015 1029   BILITOT 0.4 05/11/2015 1029   BILITOT 0.3 09/18/2012     Lab Results  Component Value Date   IRON 83 05/11/2015   TIBC 508* 05/11/2015   FERRITIN 92 05/11/2015      PENDING LABS:   RADIOGRAPHIC STUDIES:  No results found.   PATHOLOGY:    ASSESSMENT AND PLAN:  Iron deficiency anemia due to chronic blood loss Iron deficiency anemia in the setting of delayed gastric emptying on pill cam, normal gastric emptying scan on 05/22/2014, capsule endoscopy 10/10/2013 no AVMS, ulcerations, mass, EGD 11/09/2012 history of gastric erosions, history of H. pylori and chronic gastritis, colonoscopy 11/09/2012 with polyps.  Labs today: CBC diff, BMET,  iron/TIBC, Ferritin, LDH, ESR, CRP.  Labs in 3 months: CBC diff, iron/TIBC, Ferritin, ESR  Return in 3 months for follow-up.  THERAPY PLAN:  Continue to monitor his blood counts and iron studies.  All questions were answered. The patient knows to call the clinic with any problems, questions or concerns. We can certainly see the patient much sooner if necessary.  Patient and plan discussed with Dr. Ancil Linsey and she is in agreement with the aforementioned.   This note is electronically signed by: Robynn Pane, PA-C 08/12/2015 2:16 PM

## 2015-08-11 NOTE — Assessment & Plan Note (Signed)
Iron deficiency anemia in the setting of delayed gastric emptying on pill cam, normal gastric emptying scan on 05/22/2014, capsule endoscopy 10/10/2013 no AVMS, ulcerations, mass, EGD 11/09/2012 history of gastric erosions, history of H. pylori and chronic gastritis, colonoscopy 11/09/2012 with polyps.  Labs today: CBC diff, BMET, iron/TIBC, Ferritin, LDH, ESR, CRP.  Labs in 3 months: CBC diff, iron/TIBC, Ferritin, ESR  Return in 3 months for follow-up.

## 2015-08-12 ENCOUNTER — Encounter (HOSPITAL_COMMUNITY): Payer: Self-pay | Admitting: Oncology

## 2015-08-12 ENCOUNTER — Encounter (HOSPITAL_BASED_OUTPATIENT_CLINIC_OR_DEPARTMENT_OTHER): Payer: Managed Care, Other (non HMO)

## 2015-08-12 ENCOUNTER — Encounter (HOSPITAL_COMMUNITY): Payer: Managed Care, Other (non HMO) | Attending: Oncology | Admitting: Oncology

## 2015-08-12 VITALS — BP 166/58 | HR 81 | Temp 97.8°F | Resp 18 | Wt 245.6 lb

## 2015-08-12 DIAGNOSIS — D649 Anemia, unspecified: Secondary | ICD-10-CM | POA: Diagnosis present

## 2015-08-12 DIAGNOSIS — D500 Iron deficiency anemia secondary to blood loss (chronic): Secondary | ICD-10-CM | POA: Diagnosis not present

## 2015-08-12 DIAGNOSIS — R300 Dysuria: Secondary | ICD-10-CM | POA: Insufficient documentation

## 2015-08-12 LAB — FERRITIN: Ferritin: 114 ng/mL (ref 24–336)

## 2015-08-12 LAB — CBC WITH DIFFERENTIAL/PLATELET
BASOS ABS: 0 10*3/uL (ref 0.0–0.1)
Basophils Relative: 0 %
EOS PCT: 1 %
Eosinophils Absolute: 0.1 10*3/uL (ref 0.0–0.7)
HCT: 36.1 % — ABNORMAL LOW (ref 39.0–52.0)
Hemoglobin: 11.9 g/dL — ABNORMAL LOW (ref 13.0–17.0)
LYMPHS PCT: 29 %
Lymphs Abs: 3.3 10*3/uL (ref 0.7–4.0)
MCH: 29.9 pg (ref 26.0–34.0)
MCHC: 33 g/dL (ref 30.0–36.0)
MCV: 90.7 fL (ref 78.0–100.0)
Monocytes Absolute: 0.7 10*3/uL (ref 0.1–1.0)
Monocytes Relative: 6 %
NEUTROS ABS: 7.4 10*3/uL (ref 1.7–7.7)
Neutrophils Relative %: 64 %
PLATELETS: 397 10*3/uL (ref 150–400)
RBC: 3.98 MIL/uL — AB (ref 4.22–5.81)
RDW: 12.6 % (ref 11.5–15.5)
WBC: 11.4 10*3/uL — AB (ref 4.0–10.5)

## 2015-08-12 LAB — BASIC METABOLIC PANEL
ANION GAP: 10 (ref 5–15)
BUN: 8 mg/dL (ref 6–20)
CO2: 28 mmol/L (ref 22–32)
Calcium: 9.9 mg/dL (ref 8.9–10.3)
Chloride: 99 mmol/L — ABNORMAL LOW (ref 101–111)
Creatinine, Ser: 0.83 mg/dL (ref 0.61–1.24)
GFR calc Af Amer: >60 mL/min (ref >60)
Glucose, Bld: 215 mg/dL — ABNORMAL HIGH (ref 65–99)
POTASSIUM: 3.9 mmol/L (ref 3.5–5.1)
Sodium: 137 mmol/L (ref 135–145)

## 2015-08-12 LAB — LACTATE DEHYDROGENASE: LDH: 108 U/L (ref 98–192)

## 2015-08-12 LAB — IRON AND TIBC
Iron: 70 ug/dL (ref 45–182)
SATURATION RATIOS: 14 % — AB (ref 17.9–39.5)
TIBC: 497 ug/dL — ABNORMAL HIGH (ref 250–450)
UIBC: 427 ug/dL

## 2015-08-12 LAB — SEDIMENTATION RATE: SED RATE: 22 mm/h — AB (ref 0–16)

## 2015-08-12 LAB — C-REACTIVE PROTEIN: CRP: 0.6 mg/dL (ref <1.0)

## 2015-08-12 NOTE — Patient Instructions (Signed)
..  [place] at [facility] Discharge Instructions  RECOMMENDATIONS MADE BY THE CONSULTANT AND ANY TEST RESULTS WILL BE SENT TO YOUR REFERRING PHYSICIAN.  Exam in 3 months and return in 3 months  Thank you for choosing [place] at [facility] to provide your oncology and hematology care.  To afford each patient quality time with our provider, please arrive at least 15 minutes before your scheduled appointment time.    You need to re-schedule your appointment should you arrive 10 or more minutes late.  We strive to give you quality time with our providers, and arriving late affects you and other patients whose appointments are after yours.  Also, if you no show three or more times for appointments you may be dismissed from the clinic at the providers discretion.     Again, thank you for choosing [facility].  Our hope is that these requests will decrease the amount of time that you wait before being seen by our physicians.       _____________________________________________________________  Should you have questions after your visit to [facility], please contact our office at [phone] between the hours of 8:30 a.m. and 4:30 p.m.  Voicemails left after 4:30 p.m. will not be returned until the following business day.  For prescription refill requests, have your pharmacy contact our office.

## 2015-08-13 ENCOUNTER — Other Ambulatory Visit (HOSPITAL_COMMUNITY): Payer: Self-pay

## 2015-08-13 DIAGNOSIS — D500 Iron deficiency anemia secondary to blood loss (chronic): Secondary | ICD-10-CM

## 2015-08-13 NOTE — Progress Notes (Signed)
LABS DRAWN

## 2015-08-26 ENCOUNTER — Other Ambulatory Visit: Payer: Self-pay | Admitting: Gastroenterology

## 2015-09-24 ENCOUNTER — Encounter (HOSPITAL_COMMUNITY): Payer: 59 | Attending: Oncology

## 2015-09-24 DIAGNOSIS — D649 Anemia, unspecified: Secondary | ICD-10-CM | POA: Diagnosis present

## 2015-09-24 DIAGNOSIS — R300 Dysuria: Secondary | ICD-10-CM | POA: Insufficient documentation

## 2015-09-24 DIAGNOSIS — D500 Iron deficiency anemia secondary to blood loss (chronic): Secondary | ICD-10-CM

## 2015-09-24 LAB — CBC
HEMATOCRIT: 40.7 % (ref 39.0–52.0)
HEMOGLOBIN: 13.3 g/dL (ref 13.0–17.0)
MCH: 29.6 pg (ref 26.0–34.0)
MCHC: 32.7 g/dL (ref 30.0–36.0)
MCV: 90.4 fL (ref 78.0–100.0)
Platelets: 475 10*3/uL — ABNORMAL HIGH (ref 150–400)
RBC: 4.5 MIL/uL (ref 4.22–5.81)
RDW: 12.8 % (ref 11.5–15.5)
WBC: 13.5 10*3/uL — AB (ref 4.0–10.5)

## 2015-09-24 LAB — FERRITIN: Ferritin: 138 ng/mL (ref 24–336)

## 2015-11-09 NOTE — Progress Notes (Signed)
Brett Hampshire, MD [place] [address]  Iron deficiency anemia due to chronic blood loss - Plan: CBC, Iron and TIBC, Ferritin, l-methylfolate-B6-B12 (METANX) 3-35-2 MG TABS tablet, Influenza vac split quadrivalent PF (FLUARIX) injection 0.5 mL, CBC with Differential, Iron and TIBC, Ferritin  CURRENT THERAPY: Intermittent IV iron.  INTERVAL HISTORY: Brett [name] 65 y.o. male returns for followup of iron deficiency anemia in the setting of delayed gastric emptying on pill cam, normal gastric emptying scan on 05/22/2014, capsule endoscopy 10/10/2013 no AVMS, ulcerations, mass,  EGD 11/09/2012 history of gastric erosions, history of H. pylori and chronic gastritis, colonoscopy 11/09/2012 with polyps.  I personally reviewed and went over laboratory results with the patient.  The results are noted within this dictation.  Labs are updated today. They're pending at this point time.  He denies any blood loss that he can identify.  He does note a history of dark stools which is similar to before. He does have a known history of chronic GI blood loss.   He denies any complaints today.  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  His DM is managed by his primary care provider, Dr. Maudie Mercury.  Past Medical History  Diagnosis Date  . Diabetes mellitus   . Depression   . HTN (hypertension)     Cholesterol  . Dextrocardia   . H. pylori infection 11/09/2012    treated with pylera  . Iron deficiency anemia, unspecified 10/18/2012    has SPINAL STENOSIS; TENDINITIS, CALCIFIC, SHOULDER, RIGHT; IMPINGEMENT SYNDROME; HIP PAIN; GERD (gastroesophageal reflux disease); Nausea; Iron deficiency anemia due to chronic blood loss; Gastritis, Helicobacter pylori; Insulin-requiring or dependent type II diabetes mellitus ([place]); Malabsorption of iron; Abdominal pain; and Thrombocytosis ([place]) on his problem list.     has No Known  Allergies.  Current Outpatient Prescriptions on File Prior to Visit  Medication Sig Dispense Refill  . DEXILANT 60 MG capsule TAKE (1) CAPSULE BY MOUTH EVERY DAY. 90 capsule 3  . DULoxetine (CYMBALTA) 60 MG capsule Take 60 mg by mouth daily.    . fenofibrate (TRICOR) 145 MG tablet Take 145 mg by mouth daily.    . folic acid (FOLVITE) 1 MG tablet Take 1 mg by mouth daily.    Marland Kitchen gabapentin (NEURONTIN) 300 MG capsule 300 mg 3 (three) times daily.     . insulin detemir (LEVEMIR) 100 UNIT/ML injection Inject 55 Units into the skin at bedtime.     Marland Kitchen LORazepam (ATIVAN) 1 MG tablet Take 1 mg by mouth every 4 (four) hours as needed. For nerves    . metFORMIN (GLUCOPHAGE) 1000 MG tablet Take 1,000 mg by mouth 2 (two) times daily with a meal.    . metoCLOPramide (REGLAN) 5 MG tablet Take 1-2 tablets (5-10 mg total) by mouth 3 (three) times daily as needed. Take 1 or 2 tablets 3 times a day before meals and at bedtime 100 tablet 3  . Multiple Vitamin (MULTIVITAMIN WITH MINERALS) TABS tablet Take 1 tablet by mouth daily.    . pioglitazone (ACTOS) 45 MG tablet Take 45 mg by mouth daily.    . rosuvastatin (CRESTOR) 20 MG tablet Take 20 mg by mouth at bedtime.     . traMADol (ULTRAM) 50 MG tablet Take 50-100 mg by mouth every 6 (six) hours as needed. For pain     No current facility-administered medications on file prior  to visit.    Past Surgical History  Procedure Laterality Date  . Neck surgery      bone spurs  . Colonoscopy with esophagogastroduodenoscopy (egd) N/A 11/09/2012    Dr. 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 hpylori on bx- treated with pylera. TCS- normal rectum. 1 diminutive polyp in the mid descending segment. 1-54mm polyp in the mid desending segment o/w the remainder of the colonic mucosa appeared normal. tubular adenoma on bx  . Givens capsule study N/A 10/07/2013    no source for anemia or heme  positive stool noted    Denies any headaches, dizziness, double vision, fevers, chills, night sweats, nausea, vomiting, diarrhea, constipation, chest pain, heart palpitations, shortness of breath, blood in stool, black tarry stool, hematuria.   PHYSICAL EXAMINATION  ECOG PERFORMANCE STATUS: 1 - Symptomatic but completely ambulatory  Filed Vitals:   11/10/15 0943  BP: 144/56  Pulse: 81  Temp: 98.2 F (36.8 C)  Resp: 18    GENERAL:alert, no distress, comfortable, cooperative, obese, smiling and accompanied by his wife. SKIN: skin color, texture, turgor are normal, no rashes or significant lesions HEAD: Normocephalic, No masses, lesions, tenderness or abnormalities EYES: normal, PERRLA, EOMI, Conjunctiva are pink and non-injected EARS: External ears normal OROPHARYNX:lips, buccal mucosa, and tongue normal and mucous membranes are moist  NECK: supple, trachea midline LYMPH:  no palpable lymphadenopathy BREAST:not examined LUNGS: clear to auscultation without any rales, wheezes, or rhonchi. HEART: regular rate & rhythm, no murmurs, no gallops, S1 normal and S2 normal ABDOMEN:abdomen soft, non-tender, obese and normal bowel sounds BACK: Back symmetric, no curvature. EXTREMITIES:less then 2 second capillary refill, no joint deformities, effusion, or inflammation, no skin discoloration, no cyanosis  NEURO: alert & oriented x 3 with fluent speech, no focal motor/sensory deficits, using cane to help with gait.   LABORATORY DATA: CBC    Component Value Date/Time   WBC 11.8* 11/10/2015 0924   RBC 4.13* 11/10/2015 0924   HGB 12.1* 11/10/2015 0924   HCT 37.8* 11/10/2015 0924   PLT 383 11/10/2015 0924   MCV 91.5 11/10/2015 0924   MCH 29.3 11/10/2015 0924   MCHC 32.0 11/10/2015 0924   RDW 12.9 11/10/2015 0924   LYMPHSABS 2.7 11/10/2015 0924   MONOABS 0.7 11/10/2015 0924   EOSABS 0.1 11/10/2015 0924   BASOSABS 0.0 11/10/2015 0924      Chemistry      Component Value Date/Time    NA 137 08/12/2015 1257   K 3.9 08/12/2015 1257   CL 99* 08/12/2015 1257   CO2 28 08/12/2015 1257   BUN 8 08/12/2015 1257   CREATININE 0.83 08/12/2015 1257   CREATININE 0.93 08/30/2013 1059   GLU 121 09/18/2012      Component Value Date/Time   CALCIUM 9.9 08/12/2015 1257   ALKPHOS 46 05/11/2015 1029   ALKPHOS 42 09/18/2012   AST 18 05/11/2015 1029   AST 17 09/18/2012   ALT 15* 05/11/2015 1029   BILITOT 0.4 05/11/2015 1029   BILITOT 0.3 09/18/2012     Lab Results  Component Value Date   IRON 70 08/12/2015   TIBC 497* 08/12/2015   FERRITIN 138 09/24/2015      PENDING LABS:   RADIOGRAPHIC STUDIES:  No results found.   PATHOLOGY:    ASSESSMENT AND PLAN:  Iron deficiency anemia due to chronic blood loss Iron deficiency anemia in the setting of delayed gastric emptying on pill cam, normal gastric emptying scan on 05/22/2014,  capsule endoscopy 10/10/2013 no AVMS, ulcerations, mass, EGD 11/09/2012 history of gastric erosions, history of H. pylori and chronic gastritis, colonoscopy 11/09/2012 with polyps.  Oncology Flowsheet 06/11/2015  ferric gluconate (NULECIT) IV 125 mg    Labs today: CBC diff, iron/TIBC, Ferritin, CRP.  Labs in 3 and 6 months: CBC, iron/TIBC, Ferritin.  He is a candidate for influenza vaccination and we will administer that today, particularly in light of influenza epidemic this point in time.  He is now seeing Dr. Maudie Mercury as his primary care physician since Dr. Reita Chard has retired.  He saw Dr. Maudie Mercury last week who performed laboratory work. He is waiting to hear back from him regarding this. The patient reports some dizziness that occurs with positional changes in addition to when he is laying down. He reports that sometimes "the room spins" and denies the sensation of his body spinning. He notes that this is being worked on by Dr. Maudie Mercury as well.  Return in 6 months for follow-up.  At the conclusion of his appointment time today, his laboratory work was still  pending. We will call him with results. If he requires an iron replacement infusion, we'll be glad to set this up.  THERAPY PLAN:  Continue to monitor his blood counts and iron studies.  All questions were answered. The patient knows to call the clinic with any problems, questions or concerns. We can certainly see the patient much sooner if necessary.  Patient and plan discussed with Dr. Ancil Linsey and she is in agreement with the aforementioned.   This note is electronically signed by: Robynn Pane, PA-C 11/10/2015 10:14 AM

## 2015-11-09 NOTE — Assessment & Plan Note (Addendum)
Iron deficiency anemia in the setting of delayed gastric emptying on pill cam, normal gastric emptying scan on 05/22/2014, capsule endoscopy 10/10/2013 no AVMS, ulcerations, mass, EGD 11/09/2012 history of gastric erosions, history of H. pylori and chronic gastritis, colonoscopy 11/09/2012 with polyps.  Oncology Flowsheet 06/11/2015  ferric gluconate (NULECIT) IV 125 mg    Labs today: CBC diff, iron/TIBC, Ferritin, CRP.  Labs in 3 and 6 months: CBC, iron/TIBC, Ferritin.  He is a candidate for influenza vaccination and we will administer that today, particularly in light of influenza epidemic this point in time.  He is now seeing Dr. Maudie Mercury as his primary care physician since Dr. Reita Chard has retired.  He saw Dr. Maudie Mercury last week who performed laboratory work. He is waiting to hear back from him regarding this. The patient reports some dizziness that occurs with positional changes in addition to when he is laying down. He reports that sometimes "the room spins" and denies the sensation of his body spinning. He notes that this is being worked on by Dr. Maudie Mercury as well.  Care team is updated to reflect changed primary care physician.  Return in 6 months for follow-up.  At the conclusion of his appointment time today, his laboratory work was still pending. We will call him with results. If he requires an iron replacement infusion, we'll be glad to set this up.

## 2015-11-10 ENCOUNTER — Encounter (HOSPITAL_BASED_OUTPATIENT_CLINIC_OR_DEPARTMENT_OTHER): Payer: 59 | Admitting: Oncology

## 2015-11-10 ENCOUNTER — Encounter (HOSPITAL_COMMUNITY): Payer: 59 | Attending: Oncology

## 2015-11-10 ENCOUNTER — Encounter (HOSPITAL_COMMUNITY): Payer: Self-pay | Admitting: Oncology

## 2015-11-10 ENCOUNTER — Other Ambulatory Visit (HOSPITAL_COMMUNITY): Payer: Self-pay | Admitting: Oncology

## 2015-11-10 VITALS — BP 144/56 | HR 81 | Temp 98.2°F | Resp 18 | Wt 243.8 lb

## 2015-11-10 DIAGNOSIS — D500 Iron deficiency anemia secondary to blood loss (chronic): Secondary | ICD-10-CM | POA: Diagnosis present

## 2015-11-10 DIAGNOSIS — Z23 Encounter for immunization: Secondary | ICD-10-CM

## 2015-11-10 DIAGNOSIS — D649 Anemia, unspecified: Secondary | ICD-10-CM | POA: Diagnosis present

## 2015-11-10 DIAGNOSIS — R300 Dysuria: Secondary | ICD-10-CM | POA: Insufficient documentation

## 2015-11-10 LAB — CBC WITH DIFFERENTIAL/PLATELET
BASOS ABS: 0 10*3/uL (ref 0.0–0.1)
BASOS PCT: 0 %
EOS ABS: 0.1 10*3/uL (ref 0.0–0.7)
EOS PCT: 1 %
HCT: 37.8 % — ABNORMAL LOW (ref 39.0–52.0)
HEMOGLOBIN: 12.1 g/dL — AB (ref 13.0–17.0)
Lymphocytes Relative: 23 %
Lymphs Abs: 2.7 10*3/uL (ref 0.7–4.0)
MCH: 29.3 pg (ref 26.0–34.0)
MCHC: 32 g/dL (ref 30.0–36.0)
MCV: 91.5 fL (ref 78.0–100.0)
Monocytes Absolute: 0.7 10*3/uL (ref 0.1–1.0)
Monocytes Relative: 6 %
Neutro Abs: 8.3 10*3/uL — ABNORMAL HIGH (ref 1.7–7.7)
Neutrophils Relative %: 70 %
PLATELETS: 383 10*3/uL (ref 150–400)
RBC: 4.13 MIL/uL — AB (ref 4.22–5.81)
RDW: 12.9 % (ref 11.5–15.5)
WBC: 11.8 10*3/uL — AB (ref 4.0–10.5)

## 2015-11-10 LAB — IRON AND TIBC
IRON: 72 ug/dL (ref 45–182)
Saturation Ratios: 15 % — ABNORMAL LOW (ref 17.9–39.5)
TIBC: 491 ug/dL — AB (ref 250–450)
UIBC: 419 ug/dL

## 2015-11-10 LAB — FERRITIN: FERRITIN: 69 ng/mL (ref 24–336)

## 2015-11-10 LAB — C-REACTIVE PROTEIN: CRP: <0.5 mg/dL (ref <1.0)

## 2015-11-10 MED ORDER — INFLUENZA VAC SPLIT QUAD 0.5 ML IM SUSY
PREFILLED_SYRINGE | INTRAMUSCULAR | Status: AC
  Filled 2015-11-10: qty 0.5

## 2015-11-10 MED ORDER — INFLUENZA VAC SPLIT QUAD 0.5 ML IM SUSY
0.5000 mL | PREFILLED_SYRINGE | Freq: Once | INTRAMUSCULAR | Status: AC
Start: 2015-11-10 — End: 2015-11-10
  Administered 2015-11-10: 0.5 mL via INTRAMUSCULAR

## 2015-11-10 NOTE — Progress Notes (Signed)
Brett [name] presents today for injection per the provider's orders.  Influenza  administration without incident; see MAR for injection details.  Patient tolerated procedure well and without incident.  No questions or complaints noted at this time.

## 2015-11-10 NOTE — Patient Instructions (Addendum)
[place] at [facility] Discharge Instructions  RECOMMENDATIONS MADE BY THE CONSULTANT AND ANY TEST RESULTS WILL BE SENT TO YOUR REFERRING PHYSICIAN.    Exam and discussion by Kirby Crigler today Flu shot today Labs are still pending today, we will call you with those results We can always do lab work in between your visits Labs in 3 and 6 months Return to see the doctor in 6 months  Please call the clinic if you have any questions or concerns     Thank you for choosing [place] at [facility] to provide your oncology and hematology care.  To afford each patient quality time with our provider, please arrive at least 15 minutes before your scheduled appointment time.   Beginning January 23rd 2017 lab work for the [facility] will be done in the  Main lab at [facility] on 1st floor. If you have a lab appointment with the [facility] please come in thru the  Main Entrance and check in at the main information desk  You need to re-schedule your appointment should you arrive 10 or more minutes late.  We strive to give you quality time with our providers, and arriving late affects you and other patients whose appointments are after yours.  Also, if you no show three or more times for appointments you may be dismissed from the clinic at the providers discretion.     Again, thank you for choosing [facility].  Our hope is that these requests will decrease the amount of time that you wait before being seen by our physicians.       _____________________________________________________________  Should you have questions after your visit to [facility], please contact our office at [phone] between the hours of 8:30 a.m. and 4:30 p.m.  Voicemails left after 4:30 p.m. will not be returned until the following business day.  For prescription refill requests, have your pharmacy contact our office.

## 2015-11-13 ENCOUNTER — Encounter (HOSPITAL_COMMUNITY): Payer: 59 | Attending: Oncology

## 2015-11-13 VITALS — BP 147/73 | HR 73 | Temp 98.4°F | Resp 20

## 2015-11-13 DIAGNOSIS — R300 Dysuria: Secondary | ICD-10-CM | POA: Insufficient documentation

## 2015-11-13 DIAGNOSIS — D500 Iron deficiency anemia secondary to blood loss (chronic): Secondary | ICD-10-CM

## 2015-11-13 DIAGNOSIS — D649 Anemia, unspecified: Secondary | ICD-10-CM | POA: Insufficient documentation

## 2015-11-13 MED ORDER — SODIUM CHLORIDE 0.9 % IV SOLN
125.0000 mg | Freq: Once | INTRAVENOUS | Status: AC
  Administered 2015-11-13: 125 mg via INTRAVENOUS
  Filled 2015-11-13: qty 10

## 2015-11-13 MED ORDER — SODIUM CHLORIDE 0.9 % IV SOLN
Freq: Once | INTRAVENOUS | Status: AC
  Administered 2015-11-13: 14:00:00 via INTRAVENOUS

## 2015-11-13 MED ORDER — SODIUM CHLORIDE 0.9% FLUSH
10.0000 mL | Freq: Once | INTRAVENOUS | Status: AC
  Administered 2015-11-13: 10 mL via INTRAVENOUS

## 2015-11-13 NOTE — Progress Notes (Signed)
Tolerated iron infusion well. Ambulatory on discharge home with wife. 

## 2015-11-13 NOTE — Patient Instructions (Signed)
[place] at [facility] Discharge Instructions  RECOMMENDATIONS MADE BY THE CONSULTANT AND ANY TEST RESULTS WILL BE SENT TO YOUR REFERRING PHYSICIAN.  Ferric gluconate 125 mg iron infusion given as ordered.  Thank you for choosing [place] at [facility] to provide your oncology and hematology care.  To afford each patient quality time with our provider, please arrive at least 15 minutes before your scheduled appointment time.   Beginning January 23rd 2017 lab work for the [facility] will be done in the  Main lab at [facility] on 1st floor. If you have a lab appointment with the [facility] please come in thru the  Main Entrance and check in at the main information desk  You need to re-schedule your appointment should you arrive 10 or more minutes late.  We strive to give you quality time with our providers, and arriving late affects you and other patients whose appointments are after yours.  Also, if you no show three or more times for appointments you may be dismissed from the clinic at the providers discretion.     Again, thank you for choosing [facility].  Our hope is that these requests will decrease the amount of time that you wait before being seen by our physicians.       _____________________________________________________________  Should you have questions after your visit to [facility], please contact our office at [phone] between the hours of 8:30 a.m. and 4:30 p.m.  Voicemails left after 4:30 p.m. will not be returned until the following business day.  For prescription refill requests, have your pharmacy contact our office.         Resources For Cancer Patients and their Caregivers ? American Cancer Society: Can assist with transportation, wigs, general needs, runs Look Good Feel Better.        819-432-3759 ? Cancer Care: Provides financial assistance, online support groups,  medication/co-pay assistance.  1-800-813-HOPE 986-459-7238) ? [place] Assists [place] cancer patients and their families through emotional , educational and financial support.  970 163 7335 ? [place] DSS Where to apply for food stamps, Medicaid and utility assistance. 873-574-5538 ? [facility]: Transportation to medical appointments. (859) 555-6465 ? Social Security Administration: May apply for disability if have a Stage IV cancer. 949-047-6406 940-251-3959 ? [employer], Disability and Transit Services: Assists with nutrition, care and transit needs. (513) 814-3206

## 2015-11-20 ENCOUNTER — Encounter (HOSPITAL_BASED_OUTPATIENT_CLINIC_OR_DEPARTMENT_OTHER): Payer: 59

## 2015-11-20 ENCOUNTER — Encounter (HOSPITAL_COMMUNITY): Payer: Self-pay

## 2015-11-20 VITALS — BP 153/68 | HR 76 | Temp 98.4°F | Resp 20

## 2015-11-20 DIAGNOSIS — D500 Iron deficiency anemia secondary to blood loss (chronic): Secondary | ICD-10-CM

## 2015-11-20 MED ORDER — SODIUM CHLORIDE 0.9 % IV SOLN
125.0000 mg | Freq: Once | INTRAVENOUS | Status: AC
  Administered 2015-11-20: 125 mg via INTRAVENOUS
  Filled 2015-11-20: qty 10

## 2015-11-20 MED ORDER — SODIUM CHLORIDE 0.9 % IV SOLN
INTRAVENOUS | Status: DC
  Administered 2015-11-20: 13:00:00 via INTRAVENOUS

## 2015-11-20 NOTE — Progress Notes (Signed)
1445:  Tolerated infusion w/o adverse reaction.  VSS.  In no distress.  Discharged ambulatory.

## 2015-11-20 NOTE — Patient Instructions (Signed)
[place] at [facility] Discharge Instructions  RECOMMENDATIONS MADE BY THE CONSULTANT AND ANY TEST RESULTS WILL BE SENT TO YOUR REFERRING PHYSICIAN.  Iron infusion today. Return as scheduled for lab work. Return as scheduled for office visit.   Thank you for choosing [place] at [facility] to provide your oncology and hematology care.  To afford each patient quality time with our provider, please arrive at least 15 minutes before your scheduled appointment time.   Beginning January 23rd 2017 lab work for the [facility] will be done in the  Main lab at [facility] on 1st floor. If you have a lab appointment with the [facility] please come in thru the  Main Entrance and check in at the main information desk  You need to re-schedule your appointment should you arrive 10 or more minutes late.  We strive to give you quality time with our providers, and arriving late affects you and other patients whose appointments are after yours.  Also, if you no show three or more times for appointments you may be dismissed from the clinic at the providers discretion.     Again, thank you for choosing [facility].  Our hope is that these requests will decrease the amount of time that you wait before being seen by our physicians.       _____________________________________________________________  Should you have questions after your visit to [facility], please contact our office at [phone] between the hours of 8:30 a.m. and 4:30 p.m.  Voicemails left after 4:30 p.m. will not be returned until the following business day.  For prescription refill requests, have your pharmacy contact our office.         Resources For Cancer Patients and their Caregivers ? American Cancer Society: Can assist with transportation, wigs, general needs, runs Look Good Feel Better.        [phone] ? Cancer Care: Provides financial  assistance, online support groups, medication/co-pay assistance.  1-800-813-HOPE 716-441-0521) ? [facility] Assists [place] cancer patients and their families through emotional , educational and financial support.  618-749-6090 ? [place] DSS Where to apply for food stamps, Medicaid and utility assistance. ([phone] ? RCATS: Transportation to medical appointments. 4035122625 ? Social Security Administration: May apply for disability if have a Stage IV cancer. 6672022030 418-419-9825 ? [employer], Disability and Transit Services: Assists with nutrition, care and transit needs. [phone]

## 2016-02-09 ENCOUNTER — Other Ambulatory Visit (HOSPITAL_COMMUNITY): Payer: Managed Care, Other (non HMO)

## 2016-02-18 ENCOUNTER — Encounter (HOSPITAL_COMMUNITY): Payer: 59 | Attending: Oncology

## 2016-02-18 DIAGNOSIS — R300 Dysuria: Secondary | ICD-10-CM | POA: Insufficient documentation

## 2016-02-18 DIAGNOSIS — D500 Iron deficiency anemia secondary to blood loss (chronic): Secondary | ICD-10-CM

## 2016-02-18 DIAGNOSIS — D649 Anemia, unspecified: Secondary | ICD-10-CM | POA: Insufficient documentation

## 2016-02-18 LAB — IRON AND TIBC
IRON: 95 ug/dL (ref 45–182)
Saturation Ratios: 19 % (ref 17.9–39.5)
TIBC: 496 ug/dL — ABNORMAL HIGH (ref 250–450)
UIBC: 401 ug/dL

## 2016-02-18 LAB — CBC
HCT: 40.4 % (ref 39.0–52.0)
Hemoglobin: 13.2 g/dL (ref 13.0–17.0)
MCH: 29.6 pg (ref 26.0–34.0)
MCHC: 32.7 g/dL (ref 30.0–36.0)
MCV: 90.6 fL (ref 78.0–100.0)
PLATELETS: 480 10*3/uL — AB (ref 150–400)
RBC: 4.46 MIL/uL (ref 4.22–5.81)
RDW: 12.8 % (ref 11.5–15.5)
WBC: 15 10*3/uL — ABNORMAL HIGH (ref 4.0–10.5)

## 2016-02-18 LAB — FERRITIN: Ferritin: 141 ng/mL (ref 24–336)

## 2016-05-06 ENCOUNTER — Encounter (HOSPITAL_COMMUNITY): Payer: 59

## 2016-05-06 ENCOUNTER — Encounter (HOSPITAL_COMMUNITY): Payer: 59 | Attending: Oncology | Admitting: Hematology & Oncology

## 2016-05-06 ENCOUNTER — Encounter (HOSPITAL_COMMUNITY): Payer: Self-pay | Admitting: Hematology & Oncology

## 2016-05-06 ENCOUNTER — Telehealth (HOSPITAL_COMMUNITY): Payer: Self-pay | Admitting: Hematology & Oncology

## 2016-05-06 VITALS — BP 153/57 | HR 83 | Temp 97.9°F | Resp 20 | Wt 240.0 lb

## 2016-05-06 DIAGNOSIS — D500 Iron deficiency anemia secondary to blood loss (chronic): Secondary | ICD-10-CM

## 2016-05-06 DIAGNOSIS — D649 Anemia, unspecified: Secondary | ICD-10-CM | POA: Diagnosis not present

## 2016-05-06 DIAGNOSIS — E119 Type 2 diabetes mellitus without complications: Secondary | ICD-10-CM | POA: Diagnosis not present

## 2016-05-06 DIAGNOSIS — D509 Iron deficiency anemia, unspecified: Secondary | ICD-10-CM | POA: Diagnosis not present

## 2016-05-06 DIAGNOSIS — D72829 Elevated white blood cell count, unspecified: Secondary | ICD-10-CM | POA: Diagnosis not present

## 2016-05-06 DIAGNOSIS — D75839 Thrombocytosis, unspecified: Secondary | ICD-10-CM

## 2016-05-06 DIAGNOSIS — D473 Essential (hemorrhagic) thrombocythemia: Secondary | ICD-10-CM

## 2016-05-06 DIAGNOSIS — R300 Dysuria: Secondary | ICD-10-CM | POA: Insufficient documentation

## 2016-05-06 LAB — CBC WITH DIFFERENTIAL/PLATELET
BASOS ABS: 0 10*3/uL (ref 0.0–0.1)
BASOS PCT: 0 %
EOS ABS: 0.2 10*3/uL (ref 0.0–0.7)
Eosinophils Relative: 1 %
HCT: 39.4 % (ref 39.0–52.0)
Hemoglobin: 12.9 g/dL — ABNORMAL LOW (ref 13.0–17.0)
Lymphocytes Relative: 24 %
Lymphs Abs: 3.4 10*3/uL (ref 0.7–4.0)
MCH: 30.1 pg (ref 26.0–34.0)
MCHC: 32.7 g/dL (ref 30.0–36.0)
MCV: 91.8 fL (ref 78.0–100.0)
MONO ABS: 1 10*3/uL (ref 0.1–1.0)
MONOS PCT: 7 %
NEUTROS PCT: 68 %
Neutro Abs: 9.4 10*3/uL — ABNORMAL HIGH (ref 1.7–7.7)
Platelets: 420 10*3/uL — ABNORMAL HIGH (ref 150–400)
RBC: 4.29 MIL/uL (ref 4.22–5.81)
RDW: 13.5 % (ref 11.5–15.5)
WBC: 13.9 10*3/uL — ABNORMAL HIGH (ref 4.0–10.5)

## 2016-05-06 LAB — IRON AND TIBC
IRON: 79 ug/dL (ref 45–182)
SATURATION RATIOS: 15 % — AB (ref 17.9–39.5)
TIBC: 533 ug/dL — AB (ref 250–450)
UIBC: 454 ug/dL

## 2016-05-06 LAB — FERRITIN: Ferritin: 99 ng/mL (ref 24–336)

## 2016-05-06 NOTE — Progress Notes (Signed)
[name], MD [place] [address]   DIAGNOSIS: Iron deficiency Anemia Delayed gastric emptying on pill cam, normal gastric emptying scan on 05/22/2014 Capsule Endoscopy 10/10/2013 no  AVMS, ulcerations, mass  EGD 11/09/2012 history of gastric erosions, history of H pylori and chronic gastritis Colonoscopy 11/09/2012 with polyps  CURRENT THERAPY: Last IV iron 11/20/2015  INTERVAL HISTORY: Brett [name] 65 y.o. male returns for iron deficiency anemia. He has delayed gastric emptying on pill cam, normal gastric emptying scan on 05/22/2014, capsule endoscopy 10/10/2013 no AVMS, ulcerations, mass, EGD 11/09/2012 history of gastric erosions, history of H. pylori and chronic gastritis, colonoscopy 11/09/2012 with polyps. He has ongoing problems with diabetic neuropathy. He says he is doing well today.  He has a few old cars that he "fools with."   Brett [name] is accompanied by his wife. I personally reviewed and went over laboratory studies with the patient.   Reports his legs hurt all the time from neuropathy. Admits, "I have fell some. Not often, but I have fell". He uses a cane to ambulate.  He denies blood in stool or black, sticky stool.   His appetite is "so-so". Notes he doesn't eat like he should, being a diabetic.His wife notes he eats a lot of sandwiches.  His wife believes he may have allergies because his nose is always stopped up and he has yellow phlegm production. He has taken Claritin which helps with sinus headaches.   His wife talks about how Brett [name] becomes short of breath while speaking on the phone.   He continues to chew tobacco; his wife states, "He chews a lot". He has never smoked.  He sees PCP Dr. Maudie Mercury and Olen Pel, PA-C.    MEDICAL HISTORY: Past Medical History:  Diagnosis Date  . Depression   . Dextrocardia   . Diabetes mellitus   . H. pylori infection 11/09/2012   treated with pylera  . HTN (hypertension)    Cholesterol  . Iron  deficiency anemia, unspecified 10/18/2012    has SPINAL STENOSIS; TENDINITIS, CALCIFIC, SHOULDER, RIGHT; IMPINGEMENT SYNDROME; HIP PAIN; GERD (gastroesophageal reflux disease); Nausea; Iron deficiency anemia due to chronic blood loss; Gastritis, Helicobacter pylori; Insulin-requiring or dependent type II diabetes mellitus ([place]); Malabsorption of iron; Abdominal pain; and Thrombocytosis ([place]) on his problem list.     has No Known Allergies.  Brett [name] does not currently have medications on file.  SURGICAL HISTORY: Past Surgical History:  Procedure Laterality Date  . COLONOSCOPY WITH ESOPHAGOGASTRODUODENOSCOPY (EGD) N/A 11/09/2012   Dr. 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 hpylori on bx- treated with pylera. TCS- normal rectum. 1 diminutive polyp in the mid descending segment. 1-12mm polyp in the mid desending segment o/w the remainder of the colonic mucosa appeared normal. tubular adenoma on bx  . GIVENS CAPSULE STUDY N/A 10/07/2013   no source for anemia or heme positive stool noted  . NECK SURGERY     bone spurs    SOCIAL HISTORY: Social History   Social History  . Marital status: Married    Spouse name: N/A  . Number of children: 0  . Years of education: 7th grade    Occupational History  . landscaping     Social History Main Topics  . Smoking status: Never Smoker  . Smokeless tobacco: Current User    Types: Chew  . Alcohol use No  . Drug use: No  . Sexual  activity: Not on file   Other Topics Concern  . Not on file   Social History Narrative  . No narrative on file    FAMILY HISTORY: Family History  Problem Relation Age of Onset  . Diabetes Sister     Fx  . Heart defect Sister     Fx  . Arthritis Sister     Fx  . Asthma Sister     Fx  . Kidney disease Sister     Fx  . Pancreatitis Sister     Review of Systems  Constitutional: Negative.   HENT: Negative.   Eyes:  Negative.   Respiratory: Negative.   Cardiovascular: Negative.   Gastrointestinal: Negative.   Genitourinary: Negative.   Musculoskeletal: Positive for falls and joint pain.       Leg pain, uses a cane  Skin: Negative.   Neurological: Negative.   Endo/Heme/Allergies: Negative.   Psychiatric/Behavioral: Negative.   14 point review of systems was performed and is negative except as detailed under history of present illness and above   PHYSICAL EXAMINATION  ECOG PERFORMANCE STATUS: 1 - Symptomatic but completely ambulatory  Vitals:   05/06/16 1350  BP: (!) 153/57  Pulse: 83  Resp: 20  Temp: 97.9 F (36.6 C)    Physical Exam  Constitutional: He is oriented to person, place, and time. No distress.  Obese. Ambulates with cane. Able to get on exam table with assistance.  HENT:  Head: Normocephalic and atraumatic.  Mouth/Throat: Oropharynx is clear and moist. No oropharyngeal exudate.  Eyes: Conjunctivae and EOM are normal. Pupils are equal, round, and reactive to light. No scleral icterus.  Neck: Normal range of motion. Neck supple. No JVD present. No tracheal deviation present. No thyromegaly present.  Cardiovascular: Normal rate, regular rhythm, normal heart sounds and intact distal pulses.  Exam reveals no gallop and no friction rub.   No murmur heard. Pulmonary/Chest: Effort normal. No stridor. No respiratory distress. He has wheezes.  Coarse rhonchi, clears with cough.  Abdominal: Soft. Bowel sounds are normal. He exhibits no mass. There is no tenderness. There is no rebound and no guarding.  Large abdominal hernia, easily reduced  Musculoskeletal: Normal range of motion. He exhibits no edema.  Lymphadenopathy:    He has no cervical adenopathy.  Neurological: He is alert and oriented to person, place, and time. No cranial nerve deficit. Coordination normal.  Skin: Skin is warm and dry. He is not diaphoretic.  Psychiatric: Memory and judgment normal.    LABORATORY  DATA: I have reviewed the data as listed. CBC    Component Value Date/Time   WBC 13.9 (H) 05/06/2016 1314   RBC 4.29 05/06/2016 1314   HGB 12.9 (L) 05/06/2016 1314   HCT 39.4 05/06/2016 1314   PLT 420 (H) 05/06/2016 1314   MCV 91.8 05/06/2016 1314   MCH 30.1 05/06/2016 1314   MCHC 32.7 05/06/2016 1314   RDW 13.5 05/06/2016 1314   LYMPHSABS 3.4 05/06/2016 1314   MONOABS 1.0 05/06/2016 1314   EOSABS 0.2 05/06/2016 1314   BASOSABS 0.0 05/06/2016 1314   CMP     Component Value Date/Time   NA 137 08/12/2015 1257   K 3.9 08/12/2015 1257   CL 99 (L) 08/12/2015 1257   CO2 28 08/12/2015 1257   GLUCOSE 215 (H) 08/12/2015 1257   BUN 8 08/12/2015 1257   CREATININE 0.83 08/12/2015 1257   CREATININE 0.93 08/30/2013 1059   CALCIUM 9.9 08/12/2015 1257   PROT 7.5 05/11/2015 1029  PROT 6.9 09/18/2012   ALBUMIN 4.2 05/11/2015 1029   ALBUMIN 4.5 09/18/2012   AST 18 05/11/2015 1029   AST 17 09/18/2012   ALT 15 (L) 05/11/2015 1029   ALKPHOS 46 05/11/2015 1029   ALKPHOS 42 09/18/2012   BILITOT 0.4 05/11/2015 1029   BILITOT 0.3 09/18/2012   GFRNONAA >60 08/12/2015 1257   GFRAA >60 08/12/2015 1257   Results for DANZEL, CISSELL (MRN [id number])   Ref. Range 01/05/2015 12:22 05/11/2015 10:29 08/12/2015 12:57 09/24/2015 09:16 11/10/2015 09:24 02/18/2016 13:38 05/06/2016 13:14  WBC Latest Ref Range: 4.0 - 10.5 K/uL 10.1 11.4 (H) 11.4 (H) 13.5 (H) 11.8 (H) 15.0 (H) 13.9 (H)    ASSESSMENT and THERAPY PLAN:  Anemia Iron deficiency No history of BMBX Diabetes Leukocytosis thrombocytosis  He continues to require intermittent IV iron. . I have currently recommended ongoing observation. I continue to encourage the patient is to discontinue his chewing tobacco use. He is well advised of the risk of head and neck cancer with ongoing tobacco use.  Labs reviewed. Iron pending.  He has persistent elevation of his WBC count and mild thrombocytosis. Would proceed with peripheral evaluation for MPD, also  flow cytometry. Discussed with the patient and his wife and they are agreeable.   Encouraged the patient to try Claritin for 60 days which may clear up his sinus issues. I have referred the patient to [facility] in [place].   RTC 6 months.   Orders Placed This Encounter  Procedures  . BCR-ABL1, CML/ALL, PCR, QUANT  . JAK2 V617F, Rfx CALR/E12/MPL  . CALR + JAK2 EXON12 + MPL   He will be apprised of the above labs once available for review.  All questions were answered. The patient knows to call the clinic with any problems, questions or concerns. We can certainly see the patient much sooner if necessary.  This document serves as a record of services personally performed by Ancil Linsey, MD. It was created on her behalf by Arlyce Harman, a trained medical scribe. The creation of this record is based on the scribe's personal observations and the provider's statements to them. This document has been checked and approved by the attending provider.  I have reviewed the above documentation for accuracy and completeness and I agree with the above.  Molli Hazard, MD  05/06/2016

## 2016-05-06 NOTE — Patient Instructions (Addendum)
[place] at [facility] Discharge Instructions  RECOMMENDATIONS MADE BY THE CONSULTANT AND ANY TEST RESULTS WILL BE SENT TO YOUR REFERRING PHYSICIAN.  You saw Dr. Whitney Muse today. You are being referred to [facility]. Labs today. Follow up in 6 months with labs.  Thank you for choosing [place] at [facility] to provide your oncology and hematology care.  To afford each patient quality time with our provider, please arrive at least 15 minutes before your scheduled appointment time.   Beginning January 23rd 2017 lab work for the [facility] will be done in the  Main lab at [facility] on 1st floor. If you have a lab appointment with the [place] please come in thru the  Main Entrance and check in at the main information desk  You need to re-schedule your appointment should you arrive 10 or more minutes late.  We strive to give you quality time with our providers, and arriving late affects you and other patients whose appointments are after yours.  Also, if you no show three or more times for appointments you may be dismissed from the clinic at the providers discretion.     Again, thank you for choosing [facility].  Our hope is that these requests will decrease the amount of time that you wait before being seen by our physicians.       _____________________________________________________________  Should you have questions after your visit to [facility], please contact our office at [phone] between the hours of 8:30 a.m. and 4:30 p.m.  Voicemails left after 4:30 p.m. will not be returned until the following business day.  For prescription refill requests, have your pharmacy contact our office.         Resources For Cancer Patients and their Caregivers ? American Cancer Society: Can assist with transportation, wigs, general needs, runs Look Good Feel Better.         5014241813 ? Cancer Care: Provides financial assistance, online support groups, medication/co-pay assistance.  1-800-813-HOPE 531-292-7484) ? [facility] Assists [place] cancer patients and their families through emotional , educational and financial support.  ([phone] ? [place] DSS Where to apply for food stamps, Medicaid and utility assistance. 574-436-0401 ? RCATS: Transportation to medical appointments. 445-604-4154 ? Social Security Administration: May apply for disability if have a Stage IV cancer. 249-542-1755 817-219-5529 ? [employer], Disability and Transit Services: Assists with nutrition, care and transit needs. [place] Support Programs: @10RELATIVEDAYS @ > Cancer Support Group  2nd Tuesday of the month 1pm-2pm, Journey Room  > Creative Journey  3rd Tuesday of the month 1130am-1pm, Journey Room  > Look Good Feel Better  1st Wednesday of the month 10am-12 noon, Journey Room (Call [place] to register [phone])

## 2016-05-09 ENCOUNTER — Other Ambulatory Visit (HOSPITAL_COMMUNITY): Payer: Managed Care, Other (non HMO)

## 2016-05-09 ENCOUNTER — Ambulatory Visit (HOSPITAL_COMMUNITY): Payer: Managed Care, Other (non HMO) | Admitting: Hematology & Oncology

## 2016-05-13 LAB — BCR-ABL1, CML/ALL, PCR, QUANT

## 2016-05-20 LAB — CALR + JAK2 E12-15 + MPL (REFLEXED)

## 2016-05-20 LAB — JAK2 V617F, W REFLEX TO CALR/E12/MPL

## 2016-05-29 ENCOUNTER — Encounter (HOSPITAL_COMMUNITY): Payer: Self-pay | Admitting: Hematology & Oncology

## 2016-06-28 ENCOUNTER — Ambulatory Visit (INDEPENDENT_AMBULATORY_CARE_PROVIDER_SITE_OTHER): Payer: 59 | Admitting: Allergy & Immunology

## 2016-06-28 ENCOUNTER — Encounter: Payer: Self-pay | Admitting: Allergy & Immunology

## 2016-06-28 VITALS — BP 138/72 | HR 84 | Temp 98.1°F | Ht 68.0 in | Wt 230.0 lb

## 2016-06-28 DIAGNOSIS — J984 Other disorders of lung: Secondary | ICD-10-CM | POA: Diagnosis not present

## 2016-06-28 DIAGNOSIS — J310 Chronic rhinitis: Secondary | ICD-10-CM

## 2016-06-28 DIAGNOSIS — R05 Cough: Secondary | ICD-10-CM | POA: Diagnosis not present

## 2016-06-28 DIAGNOSIS — R059 Cough, unspecified: Secondary | ICD-10-CM

## 2016-06-28 MED ORDER — FLUTICASONE PROPIONATE 50 MCG/ACT NA SUSP: 2.0000 | Freq: Every day | NASAL | 12 refills | Status: DC

## 2016-06-28 NOTE — Patient Instructions (Addendum)
1. Chronic rhinitis - We will get blood testing to look for environmental allergies. - In the meantime, start Flonase two sprays per nostril once daily. - Take cetirizine 10mg  (Zyrtec) once daily as needed for breakthrough symptoms. - We will call you with results of the blood work.  2. Cough - Lung testing showed some restriction but it did not improve with albuterol. - I do not think that you have asthma but we will continue to watch. - If this is still abnormal at the next visit, we will send you for full lung testing.  3. Return in about 3 months (around 09/28/2016).  Please inform us of any Emergency Department visits, hospitalizations, or changes in symptoms. Call us before going to the ED for breathing or allergy symptoms since we might be able to fit you in for a sick visit. Feel free to contact us anytime with any questions, problems, or concerns.  It was a pleasure to meet you and your family today!   Websites that have reliable patient information: 1. American Academy of Asthma, Allergy, and Immunology: www.aaaai.org 2. Food Allergy Research and Education (FARE): foodallergy.org 3. Mothers of Asthmatics: http://www.asthmacommunitynetwork.org 4. American College of Allergy, Asthma, and Immunology: www.acaai.org

## 2016-06-28 NOTE — Progress Notes (Signed)
NEW PATIENT  Date of Service/Encounter:  06/28/16   Assessment:   Other chronic rhinitis - Plan: Allergen Zone 3  Cough    Plan/Recommendations:   1. Chronic rhinitis - We will get blood testing to look for environmental allergies. - In the meantime, start Flonase two sprays per nostril once daily. - Take cetirizine 62m (Zyrtec) once daily as needed for breakthrough symptoms. - We will call you with results of the blood work.  2. Cough - Lung testing showed some restriction but it did not improve with albuterol. - I do not think that you have asthma but we will continue to watch. - If this is still abnormal at the next visit, we will send you for full lung testing. - Lung function secondary to body habitus or patient ability. - He did not feel any better with the bronchodilator.   3. Return in about 3 months (around 09/28/2016).    Subjective:   Brett Brett [name] [name] 65y.o. male presenting today for evaluation of  Chief Complaint  Patient presents with  . New Evaluation    Allergy testing. Stays congested and productive cough.   .Brett Cornerhas [name] history of the following: Patient Active Problem List   Diagnosis Date Noted  . Thrombocytosis ([facility] 10/09/2014  . Abdominal pain 09/23/2014  . Gastritis, Helicobacter pylori 047/05/6282 . Insulin-requiring or dependent type II diabetes mellitus ([facility] 01/03/2014  . 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 GERD (gastroesophageal reflux disease) 10/18/2012  . Nausea 10/18/2012  . Iron deficiency anemia due to chronic blood loss 10/18/2012  . HIP PAIN 08/31/2010  . SPINAL STENOSIS 06/28/2010  . TENDINITIS, CALCIFIC, SHOULDER, RIGHT 06/14/2010  . IMPINGEMENT SYNDROME 06/14/2010    History obtained from: chart review and patient and patient's wife of 32 years.  Brett [name] referred by JJani Gravel MD.     Brett [name] 65y.o. male presenting for chronic nasal congestion. He reports that over the weekend he had  [name] problem with nasal congestion and ear "stopped up" over the weekend. He has dizziness with these symptoms as well. He has this intermittently especially at night. Wife reports that he has constant coughing up of phlegm for years. He has [name] runny nose. He has used Clarinex only as needed but unfortunately he took that last night. Symptoms occur throughout the year. They do not seem to worsen around any particular environment. He has never been allergy tested before. He does have [name] nose spray which he has used as needed for colds. He does have [name] nasal voice which wife reports he has had for years.   He did have [name] history of heart burn and is Dexilant. He thinks he has been on it for one year. He does not have asthma, although he had it when he was much younger.  He reports that he has organs "on the wrong side" but [name] recent CXR from 2015 is normal. He has never been in the hospital for breathing.   Brett [name] history of iron deficiency anemia and receives intermittent IV iron infusions. He also has [name] history of diabetes which is controlled by her PCP. 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 There is no significant infectious history and he does not receive antibiotics very often. Vaccinations are up to  date.    Past Medical History: Patient Active Problem List   Diagnosis Date Noted  . Thrombocytosis ([place]) 10/09/2014  . Abdominal pain 09/23/2014  . Gastritis, Helicobacter pylori 69/67/8938  . Insulin-requiring or dependent type II diabetes mellitus ([place]) 01/03/2014  . 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 GERD (gastroesophageal reflux disease) 10/18/2012  . Nausea 10/18/2012  . Iron deficiency anemia due to chronic blood loss 10/18/2012  . HIP PAIN 08/31/2010  . SPINAL STENOSIS 06/28/2010  . TENDINITIS,  CALCIFIC, SHOULDER, RIGHT 06/14/2010  . IMPINGEMENT SYNDROME 06/14/2010    Medication List:    Medication List       Accurate as of 06/28/16 11:08 AM. Always use your most recent med list.          buPROPion 100 MG tablet Commonly known as:  WELLBUTRIN Take 100 mg by mouth 2 (two) times daily.   DEXILANT 60 MG capsule Generic drug:  dexlansoprazole TAKE (1) CAPSULE BY MOUTH EVERY DAY.   DULoxetine 60 MG capsule Commonly known as:  CYMBALTA Take 60 mg by mouth daily.   fenofibrate 145 MG tablet Commonly known as:  TRICOR Take 145 mg by mouth daily.   fluticasone 50 MCG/ACT nasal spray Commonly known as:  FLONASE Place 2 sprays into both nostrils daily.   folic acid 1 MG tablet Commonly known as:  FOLVITE Take 1 mg by mouth daily.   gabapentin 300 MG capsule Commonly known as:  NEURONTIN 300 mg 4 (four) times daily.   insulin detemir 100 UNIT/ML injection Commonly known as:  LEVEMIR Inject 55 Units into the skin at bedtime.   l-methylfolate-B6-B12 3-35-2 MG Tabs tablet Commonly known as:  METANX Take 1 tablet by mouth 2 (two) times daily.   LORazepam 1 MG tablet Commonly known as:  ATIVAN Take 1 mg by mouth every 4 (four) hours as needed. For nerves   metFORMIN 1000 MG tablet Commonly known as:  GLUCOPHAGE Take 1,000 mg by mouth 2 (two) times daily with [name] meal.   metoCLOPramide 5 MG tablet Commonly known as:  REGLAN Take 1-2 tablets (5-10 mg total) by mouth 3 (three) times daily as needed. Take 1 or 2 tablets 3 times [name] day before meals and at bedtime   multivitamin tablet Take 1 tablet by mouth daily.   multivitamin with minerals Tabs tablet Take 1 tablet by mouth daily.   omeprazole 40 MG capsule Commonly known as:  PRILOSEC Take 40 mg by mouth daily.   pioglitazone 45 MG tablet Commonly known as:  ACTOS Take 45 mg by mouth daily.   promethazine 12.5 MG tablet Commonly known as:  PHENERGAN   rosuvastatin 20 MG tablet Commonly known as:   CRESTOR Take 20 mg by mouth at bedtime.   sitaGLIPtin 100 MG tablet Commonly known as:  JANUVIA Take 50-100 mg by mouth daily.   traMADol 50 MG tablet Commonly known as:  ULTRAM Take 50-100 mg by mouth every 6 (six) hours as needed. For pain       Birth History: non-contributory. Born at term without complications.   Developmental History: Brett [name] has met all milestones on time. He has required no speech therapy, occupational therapy, or physical therapy.   Past Surgical History: Past Surgical History:  Procedure Laterality Date  . CATARACT EXTRACTION, BILATERAL  2016  . COLONOSCOPY WITH ESOPHAGOGASTRODUODENOSCOPY (EGD) N/[name] 11/09/2012   Dr. 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 hpylori on bx- treated with pylera. TCS- normal rectum.  1 diminutive polyp in the mid descending segment. 1-47m polyp in the mid desending segment o/w the remainder of the colonic mucosa appeared normal. tubular adenoma on bx  . GIVENS CAPSULE STUDY N/[name] 10/07/2013   no source for anemia or heme positive stool noted  . NECK SURGERY     bone spurs     Family History: Family History  Problem Relation Age of Onset  . Diabetes Sister     Fx  . Heart defect Sister     Fx  . Arthritis Sister     Fx  . Asthma Sister     Fx  . Kidney disease Sister     Fx  . Pancreatitis Sister      Social History: JJoandylives at home with his wife. They do not have children. However they are primary caregivers for two of Ilai's sisters and one of Trajon's nieces have intellectual disabilities. Thankfully they have the services of CNAs therefore they do not have to care for them full time. There is one dog at home and there was previously [name] cat who lived there fore 12 years. Neither of the pets seemed to make his symptoms worse. 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 His wife works as CEngineer, maintenance (IT) Tobacco. There are no dust mite covers. He uses chewing tobacco but no inhaled forms.    Review of Systems: [name] 14-point review of systems is pertinent for what is mentioned in HPI.  Otherwise, all other systems were negative. Constitutional: negative other than that listed in the HPI Eyes: negative other than that listed in the HPI Ears, nose, mouth, throat, and face: negative other than that listed in the HPI Respiratory: negative other than that listed in the HPI Cardiovascular: negative other than that listed in the HPI Gastrointestinal: negative other than that listed in the HPI Genitourinary: negative other than that listed in the HPI Integument: negative other than that listed in the HPI Hematologic: negative other than that listed in the HPI Musculoskeletal: negative other than that listed in the HPI Neurological: negative other than that listed in the HPI Allergy/Immunologic: negative other than that listed in the HPI    Objective:   Blood pressure 138/72, pulse 84, temperature 98.1 F (36.7 C), temperature source Oral, height _0  (1.727 m), weight 230 lb (104.3 kg), SpO2 94 %. Body mass index is 34.97 kg/m.   Physical Exam:  General: Alert, interactive, in no acute distress. Cooperative with the exam. Somewhat difficult to understand him. Obese male. Tangential discussions.  HEENT: TMs pearly gray, turbinates edematous and pale with clear discharge, post-pharynx erythematous. Neck: Supple without thyromegaly. Adenopathy: no enlarged lymph nodes appreciated in the anterior cervical, occipital, axillary, epitrochlear, inguinal, or popliteal regions Lungs: Clear to auscultation without wheezing, rhonchi or rales. No increased work of breathing. CV: Normal S1/S2, no murmurs. Somewhat distant heart sounds but present. Capillary refill <2 seconds.  Abdomen: Nondistended, nontender. No guarding or rebound tenderness. Bowel sounds faint and present in all fields  Skin: Warm and  dry, without lesions or rashes.  Extremities:  No clubbing, cyanosis or edema. Neuro:   Grossly intact.  Diagnostic studies:  Spirometry: results abnormal (FEV1: 1.76/57%, FVC: 2.33/59%, FEV1/FVC: 75%).    Spirometry consistent with possible restrictive disease. Albuterol nebulizer treatment given in clinic with no improvement. Effort was reportedly good. Restriction could be secondary to body habitus versus effort.    Allergy Studies: none (patient took antihistamines last night)  Salvatore Marvel, MD [facility] [place]

## 2016-07-11 LAB — CP584 ZONE 3
Allergen, A. alternata, m6: <0.1 kU/L
Allergen, Black Locust, Acacia9: <0.1 kU/L
Allergen, C. Herbarum, M2: <0.1 kU/L
Allergen, Comm Silver Birch, t9: <0.1 kU/L
Allergen, D pternoyssinus,d7: <0.1 kU/L
Allergen, Mucor Racemosus, M4: <0.1 kU/L
Allergen, P. notatum, m1: <0.1 kU/L
Aspergillus fumigatus, m3: <0.1 kU/L
Bermuda Grass: <0.1 kU/L
Cat Dander: <0.1 kU/L
Cockroach: <0.1 kU/L
Common Ragweed: <0.1 kU/L
D. farinae: <0.1 kU/L
Elm IgE: <0.1 kU/L
Johnson Grass: <0.1 kU/L
Meadow Grass: <0.1 kU/L
Pecan/Hickory Tree IgE: <0.1 kU/L
Plantain: <0.1 kU/L

## 2016-07-19 ENCOUNTER — Telehealth: Payer: Self-pay

## 2016-07-19 NOTE — Telephone Encounter (Signed)
Clld pt - advsd of lab results, providers recommendation for future skin test and to continuet use of Flonase as prescribed since its a good medication. Pt stated he understood and would probably do the skin test at his next appt in January. Pt stated he has no other questions at this time.

## 2016-07-19 NOTE — Telephone Encounter (Signed)
-----   Message from Valentina Shaggy, MD sent at 07/15/2016 10:23 PM EDT ----- Could someone call Mr. Minchew to let him know that his labs were negative for the allergens tested? We could skin test him in the future, since this is more sensitive. Flonase will work for allergic and non-allergic rhinitis, in any case, so he is on a good medication.   Salvatore Marvel, MD [facility]

## 2016-07-29 ENCOUNTER — Telehealth: Payer: Self-pay | Admitting: *Deleted

## 2016-07-29 NOTE — Telephone Encounter (Signed)
Mailed letter to patient's home to call office. 

## 2016-07-29 NOTE — Telephone Encounter (Signed)
-----   Message from Valentina Shaggy, MD sent at 07/21/2016  9:07 AM EST ----- I'm unsure whether anyone has followed up with Mr. Kinzie... If not could someone call to let him know? Thanks!   Salvatore Marvel, MD [facility] [place]

## 2016-07-29 NOTE — Telephone Encounter (Signed)
Notes Recorded by Orlene Erm, LPN on [id number] at 9:55 AM EST Left message to return call ------  Notes Recorded by Angelica Ran, CMA on 07/21/2016 at 6:22 PM EST Left message for patient to call the office. ------  Notes Recorded by Valentina Shaggy, MD on 07/21/2016 at 9:07 AM EST I'm unsure whether anyone has followed up with Brett [name]... If not could someone call to let him know? Thanks!   Salvatore Marvel, MD [place]  ------  Notes Recorded by Valentina Shaggy, MD on 07/15/2016 at 10:23 PM EDT Could someone call Brett [name] to let him know that his labs were negative for the allergens tested? We could skin test him in the future, since this is more sensitive. Flonase will work for allergic and non-allergic rhinitis, in any case, so he is on a good medication.   Salvatore Marvel, MD [facility]  ------  Notes Recorded by Valentina Shaggy, MD on 07/11/2016 at 8:19 PM EDT Could someone call Brett [name] to let him know that his labs were negative for the allergens tested? We could skin test him in the future, since this is more sensitive. Flonase will work for allergic and non-allergic rhinitis, in any case, so he is on a good medication.

## 2016-09-27 ENCOUNTER — Encounter (INDEPENDENT_AMBULATORY_CARE_PROVIDER_SITE_OTHER): Payer: Self-pay

## 2016-09-27 ENCOUNTER — Ambulatory Visit (INDEPENDENT_AMBULATORY_CARE_PROVIDER_SITE_OTHER): Payer: 59 | Admitting: Allergy & Immunology

## 2016-09-27 ENCOUNTER — Encounter: Payer: Self-pay | Admitting: Allergy & Immunology

## 2016-09-27 VITALS — BP 128/62 | HR 81 | Temp 98.5°F | Resp 20 | Ht 67.72 in | Wt 230.0 lb

## 2016-09-27 DIAGNOSIS — J300 Vasomotor rhinitis: Secondary | ICD-10-CM

## 2016-09-27 DIAGNOSIS — J019 Acute sinusitis, unspecified: Secondary | ICD-10-CM | POA: Diagnosis not present

## 2016-09-27 DIAGNOSIS — J984 Other disorders of lung: Secondary | ICD-10-CM | POA: Diagnosis not present

## 2016-09-27 MED ORDER — AMOXICILLIN-POT CLAVULANATE 875-125 MG PO TABS: 1.0000 | ORAL_TABLET | Freq: Two times a day (BID) | ORAL | 0 refills | Status: AC

## 2016-09-27 MED ORDER — ALBUTEROL SULFATE HFA 108 (90 BASE) MCG/ACT IN AERS: 2.0000 | INHALATION_SPRAY | RESPIRATORY_TRACT | 2 refills | Status: DC | PRN

## 2016-09-27 MED ORDER — AZELASTINE HCL 0.1 % NA SOLN: 2.0000 | Freq: Two times a day (BID) | NASAL | 5 refills | Status: DC

## 2016-09-27 MED ORDER — BUDESONIDE-FORMOTEROL FUMARATE 160-4.5 MCG/ACT IN AERO: 2.0000 | INHALATION_SPRAY | Freq: Two times a day (BID) | RESPIRATORY_TRACT | 5 refills | Status: DC

## 2016-09-27 MED ORDER — PREDNISONE 10 MG PO TABS: ORAL_TABLET | ORAL | 0 refills | Status: DC

## 2016-09-27 NOTE — Progress Notes (Signed)
FOLLOW UP  Date of Service/Encounter:  09/27/16   Assessment:   Restrictive lung disease  Chronic vasomotor rhinitis  Acute sinusitis   Asthma Reportables:  Severity: severe persistent  Risk: high Control: very poorly controlled  Seasonal Influenza Vaccine: yes   PPSV-23 Vaccine (CDC recommended for patients with persistent asthma 19-64yo): Yes    Plan/Recommendations:    1. Restrictive lung disease - with a history of questionable bronchiectasis from a chest CT in 2003  - Lung function has not changed much since the last visit. - However, he did have significant reversibility at this visit (which was lacking at the last visit). - I am unsure of the etiology of his restrictive lung disease. - [name] has never smoked cigarettes, but it should be noted that his wife as well as his brother who lived with him until his death two years ago did smoke significant amounts in their home.   - COPD is a possibility as well with the history of chronic mucous production, and with his current spirometric values he would be categorized as a severe COPD.  - Bronchiectasis as an underlying cause of the chronic mucous production needs to be ruled out, however, which is why the HRCT of the chest is so important in this diagnostic workup.   - If bronchiectasis is present, he would benefit from a Pulmonology evaluation and possible initiation of chest physiotherapy.  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 - Kartagener's syndrome is diagnosed via a ciliary biopsy, which would require either a bronchoscopy or nasal endoscopy.  - We will get a chest X-ray to make sure that we are not dealing with acute pneumonia. - We will also arrange for full pulmonary function testing as well as a chest CT.  - In the meantime, start Symbicort 160/4.5 two puffs twice daily with spacer. - Start  prednisone burst to see if this can provide some reversibility: 60mg  daily for four days, 40mg  daily for four days, 20mg  daily for four days, 10mg  daily for four days, then STOP. - Daily controller medication(s): Symbicort 160/4.5 two puffs twice daily with spacer - Rescue medications: ProAir 4 puffs every 4-6 hours as needed - Asthma control goals:  * Full participation in all desired activities (may need albuterol before activity) * Albuterol use two time or less a week on average (not counting use with activity) * Cough interfering with sleep two time or less a month * Oral steroids no more than once a year * No hospitalizations  2. Chronic vasomotor rhinitis - Continue with Flonase two sprays per nostril once daily. - Add Astelin nasal spray two sprays per nostril 1-2 times daily.   3. Acute sinusitis - Start Augmentin 875mg  one tablet twice daily for two weeks.  4. Return in about 4 weeks (around 10/25/2016).   Subjective:   Brett [name] is a 66 y.o. male presenting today for follow up of  Chief Complaint  Patient presents with  . Follow-up    still has cough- productive cough with some yellow discharge    Brett [name] has a history of the following: Patient Active Problem List   Diagnosis Date Noted  . Thrombocytosis ([place]) 10/09/2014  . Abdominal pain 09/23/2014  . Gastritis, Helicobacter pylori [id number]  . Insulin-requiring or dependent type II diabetes mellitus ([place]) 01/03/2014  . 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 GERD (gastroesophageal  reflux disease) 10/18/2012  . Nausea 10/18/2012  . Iron deficiency anemia due to chronic blood loss 10/18/2012  . HIP PAIN 08/31/2010  . SPINAL STENOSIS 06/28/2010  . TENDINITIS, CALCIFIC, SHOULDER, RIGHT 06/14/2010  . IMPINGEMENT SYNDROME 06/14/2010    History obtained from: chart review and patient and his wife.  [name] was referred by [name], MD.     Brett [name] is a 66 y.o. male presenting for a follow up  visit. He was last seen in October 2017 for his first visit. At that time, he had restriction noted on his spirometry. We did not do skin testing since his lung function was so poor but a Zone 3 was negative for all allergens. We did start Flonase two sprays per nostril daily as well as cetirizine 10mg  daily. At the time, he and his wife felt that the coughing was related to allergies therefore we did not pursue the pulmonary aspect of his symptoms as they did not seem ready to broach that subject.   Since the last visit, he has not changed at all and they are now interested in a pursuing a pulmonary workup. He remains on the Flonase 2 sprays per nostril daily. This does not seem to help. He has never been on an inhaler according to the patient and his wife. He has never had albuterol. He is going to discuss his sleep study later this week due to concern of sleep apnea. Again, he has not smoked personally, but he has been around people in his home who were chronic smokers. His wife works at [employer] and was a smoker for quite a while. His brother, who was a chronic smoker, also lives with him until his death 2 years ago. He has never been hospitalized for his breathing. His wife does report that he had a "spot on his lung" years ago, but never had this worked up. He did see Dr. Luan Pulling at some point during this workup, but I am unable to see his notes at this time. He does not remember when his last chest x-ray was done. Review of his chart shows that he had a chest x-ray in October 2015 was read as no acute cardiopulmonary findings. He also had a chest CT performed in 2003 that had multiple findings including situs inversus as well as possible bronchiectasis.  Otherwise, there have been no changes to his past medical history, surgical history, family history, or social history.  Chest CT (2003): Notable for situs inversus. There was no lymphadenopathy appreciated. There is mild scarring in  both lung apices. There was diffuse bronchial thickening noted. There was also a subpleural density which was felt to be secondary to scarring or rounded atelectasis related to bronchiectasis. There was no evidence of pulmonary nodules.    Review of Systems: a 14-point review of systems is pertinent for what is mentioned in HPI.  Otherwise, all other systems were negative. Constitutional: negative other than that listed in the HPI Eyes: negative other than that listed in the HPI Ears, nose, mouth, throat, and face: negative other than that listed in the HPI Respiratory: negative other than that listed in the HPI Cardiovascular: negative other than that listed in the HPI Gastrointestinal: negative other than that listed in the HPI Genitourinary: negative other than that listed in the HPI Integument: negative other than that listed in the HPI Hematologic: negative other than that listed in the HPI Musculoskeletal: negative other than that listed in the HPI Neurological: negative  other than that listed in the HPI Allergy/Immunologic: negative other than that listed in the HPI    Objective:   Blood pressure 128/62, pulse 81, temperature 98.5 F (36.9 C), temperature source Oral, resp. rate 20, height 5' 7.72" (1.72 m), weight 230 lb (104.3 kg), SpO2 95 %. Body mass index is 35.26 kg/m.   Physical Exam:  General: Alert, interactive, in no acute distress. Obese male. Uses a cane. Eyes: No conjunctival injection present on the right, No conjunctival injection present on the left, PERRL bilaterally, No discharge on the right, No discharge on the left and No Horner-Trantas dots present Ears: Right TM pearly gray with normal light reflex, Left TM pearly gray with normal light reflex, Right TM intact without perforation and Left TM intact without perforation.  Nose/Throat: External nose within normal limits and septum midline, turbinates markedly edematous and pale with clear discharge,  post-pharynx mildly erythematous with cobblestoning in the posterior oropharynx. Tonsils 2+ without exudates Neck: Supple without thyromegaly. Lungs: Decreased breath sounds with expiratory wheezing bilaterally. Increased work of breathing. CV: Normal S1/S2, no murmurs. Capillary refill <2 seconds.  Skin: Warm and dry, without lesions or rashes. Neuro:   Grossly intact. No focal deficits appreciated. Responsive to questions.   Diagnostic studies:  Spirometry: results abnormal (FEV1: 1.32/43%, FVC: 2.15/55%, FEV1/FVC: 61%).    Spirometry consistent with mixed obstructive and restrictive disease. Albuterol/Atrovent nebulizer treatment given in clinic with significant improvement. The FVC increased to 530 mL (25%) while the FEV1 increases 240 mL (18%). The FEF 25-75% increased 160 mL (25%). However, following DuoNeb administration, his values still did not normalize.  Allergy Studies: None     Brett Marvel, MD [facility] [place]

## 2016-09-27 NOTE — Patient Instructions (Addendum)
1. Restrictive lung disease - Lung function has not changed much since the last visit. - We will get a chest X-ray to make sure that we are not dealing with pneumonia. - We will also arrange for full pulmonary function testing as well as a chest CT.  - In the meantime, start Symbicort 160/4.5 two puffs twice daily with spacer. - This can help control the inflammation and help with breathing.  - Daily controller medication(s): Symbicort 160/4.5 two puffs twice daily with spacer - Rescue medications: ProAir 4 puffs every 4-6 hours as needed - Asthma control goals:  * Full participation in all desired activities (may need albuterol before activity) * Albuterol use two time or less a week on average (not counting use with activity) * Cough interfering with sleep two time or less a month * Oral steroids no more than once a year * No hospitalizations  2. Chronic vasomotor rhinitis - Continue with Flonase two sprays per nostril once daily. - Add Astelin nasal spray two sprays per nostril 1-2 times daily.   3. Acute sinusitis - Start Augmentin 875mg  one tablet twice daily for two weeks.  4. Return in about 4 weeks (around 10/25/2016).  Please inform us of any Emergency Department visits, hospitalizations, or changes in symptoms. Call us before going to the ED for breathing or allergy symptoms since we might be able to fit you in for a sick visit. Feel free to contact us anytime with any questions, problems, or concerns.  It was a pleasure to see you and your family again today! Best wishes in the [place] Year!   Websites that have reliable patient information: 1. American Academy of Asthma, Allergy, and Immunology: www.aaaai.org 2. Food Allergy Research and Education (FARE): foodallergy.org 3. Mothers of Asthmatics: http://www.asthmacommunitynetwork.org 4. American College of Allergy, Asthma, and Immunology: www.acaai.org

## 2016-09-29 ENCOUNTER — Institutional Professional Consult (permissible substitution): Payer: 59 | Admitting: Neurology

## 2016-09-30 ENCOUNTER — Telehealth: Payer: Self-pay

## 2016-09-30 NOTE — Telephone Encounter (Signed)
I called the patient, and spoke to the wife per patient from home on Wednesday (1/17), and advised the pt's wife that the office is closed and we will call back to reschedule (due to the snow). Patient's wife voiced understanding.

## 2016-09-30 NOTE — Telephone Encounter (Signed)
I called patient to r/s. He asked that I call back later and r/s with his wife.

## 2016-10-04 NOTE — Telephone Encounter (Signed)
Patient is already r/s.

## 2016-10-05 ENCOUNTER — Institutional Professional Consult (permissible substitution): Payer: 59 | Admitting: Neurology

## 2016-10-14 ENCOUNTER — Ambulatory Visit (HOSPITAL_COMMUNITY)
Admission: RE | Admit: 2016-10-14 | Discharge: 2016-10-14 | Disposition: A | Discharge status: Home / Self Care | Payer: 59 | Source: Ambulatory Visit | Attending: Allergy & Immunology | Admitting: Allergy & Immunology

## 2016-10-14 DIAGNOSIS — J984 Other disorders of lung: Secondary | ICD-10-CM | POA: Diagnosis present

## 2016-10-14 LAB — PULMONARY FUNCTION TEST
FEF 25-75 Post: 1.49 L/sec
FEF 25-75 Pre: 1.31 L/sec
FEF2575-%Change-Post: 13 %
FEF2575-%PRED-PRE: 51 %
FEF2575-%Pred-Post: 58 %
FEV1-%Change-Post: 4 %
FEV1-%PRED-PRE: 58 %
FEV1-%Pred-Post: 61 %
FEV1-PRE: 1.86 L
FEV1-Post: 1.93 L
FEV1FVC-%Change-Post: 2 %
FEV1FVC-%Pred-Pre: 97 %
FEV6-%Change-Post: 4 %
FEV6-%PRED-POST: 64 %
FEV6-%PRED-PRE: 61 %
FEV6-POST: 2.58 L
FEV6-Pre: 2.48 L
FEV6FVC-%CHANGE-POST: 2 %
FEV6FVC-%PRED-POST: 105 %
FEV6FVC-%Pred-Pre: 103 %
FVC-%Change-Post: 1 %
FVC-%PRED-PRE: 59 %
FVC-%Pred-Post: 61 %
FVC-POST: 2.58 L
FVC-PRE: 2.53 L
POST FEV6/FVC RATIO: 100 %
PRE FEV1/FVC RATIO: 73 %
PRE FEV6/FVC RATIO: 98 %
Post FEV1/FVC ratio: 75 %

## 2016-10-14 MED ORDER — ALBUTEROL SULFATE (2.5 MG/3ML) 0.083% IN NEBU
2.5000 mg | INHALATION_SOLUTION | Freq: Once | RESPIRATORY_TRACT | Status: AC
  Administered 2016-10-14: 2.5 mg via RESPIRATORY_TRACT

## 2016-10-24 ENCOUNTER — Telehealth: Payer: Self-pay | Admitting: Allergy & Immunology

## 2016-10-24 NOTE — Telephone Encounter (Signed)
Wife is returning a call about her husband's test results.

## 2016-10-25 NOTE — Telephone Encounter (Signed)
Tried to call wife but voicemail not set up unable to leave message to call office

## 2016-10-26 NOTE — Telephone Encounter (Signed)
Spoke to wife advised results and information per Dr Ernst Bowler

## 2016-11-01 ENCOUNTER — Other Ambulatory Visit (HOSPITAL_COMMUNITY): Payer: Self-pay | Admitting: *Deleted

## 2016-11-01 DIAGNOSIS — D75839 Thrombocytosis, unspecified: Secondary | ICD-10-CM

## 2016-11-01 DIAGNOSIS — D473 Essential (hemorrhagic) thrombocythemia: Secondary | ICD-10-CM

## 2016-11-04 ENCOUNTER — Encounter (HOSPITAL_COMMUNITY): Payer: Self-pay

## 2016-11-04 ENCOUNTER — Encounter (HOSPITAL_COMMUNITY): Payer: 59 | Attending: Oncology | Admitting: Oncology

## 2016-11-04 ENCOUNTER — Encounter (HOSPITAL_COMMUNITY): Payer: 59 | Attending: Oncology

## 2016-11-04 VITALS — BP 156/57 | HR 80 | Temp 98.7°F | Resp 20 | Wt 246.0 lb

## 2016-11-04 DIAGNOSIS — E1140 Type 2 diabetes mellitus with diabetic neuropathy, unspecified: Secondary | ICD-10-CM | POA: Diagnosis not present

## 2016-11-04 DIAGNOSIS — D473 Essential (hemorrhagic) thrombocythemia: Secondary | ICD-10-CM | POA: Insufficient documentation

## 2016-11-04 DIAGNOSIS — D509 Iron deficiency anemia, unspecified: Secondary | ICD-10-CM | POA: Diagnosis not present

## 2016-11-04 DIAGNOSIS — D75839 Thrombocytosis, unspecified: Secondary | ICD-10-CM

## 2016-11-04 LAB — CBC WITH DIFFERENTIAL/PLATELET
Basophils Absolute: 0 10*3/uL (ref 0.0–0.1)
Basophils Relative: 0 %
Eosinophils Absolute: 0 10*3/uL (ref 0.0–0.7)
Eosinophils Relative: 0 %
HCT: 36.3 % — ABNORMAL LOW (ref 39.0–52.0)
Hemoglobin: 11.8 g/dL — ABNORMAL LOW (ref 13.0–17.0)
Lymphocytes Relative: 21 %
Lymphs Abs: 2.7 10*3/uL (ref 0.7–4.0)
MCH: 29.6 pg (ref 26.0–34.0)
MCHC: 32.5 g/dL (ref 30.0–36.0)
MCV: 91.2 fL (ref 78.0–100.0)
Monocytes Absolute: 0.9 10*3/uL (ref 0.1–1.0)
Monocytes Relative: 7 %
Neutro Abs: 9.6 10*3/uL — ABNORMAL HIGH (ref 1.7–7.7)
Neutrophils Relative %: 73 %
Platelets: 569 10*3/uL — ABNORMAL HIGH (ref 150–400)
RBC: 3.98 MIL/uL — ABNORMAL LOW (ref 4.22–5.81)
RDW: 13.3 % (ref 11.5–15.5)
WBC: 13.3 10*3/uL — ABNORMAL HIGH (ref 4.0–10.5)

## 2016-11-04 LAB — IRON AND TIBC
IRON: 48 ug/dL (ref 45–182)
Saturation Ratios: 11 % — ABNORMAL LOW (ref 17.9–39.5)
TIBC: 431 ug/dL (ref 250–450)
UIBC: 383 ug/dL

## 2016-11-04 LAB — C-REACTIVE PROTEIN: CRP: <0.8 mg/dL (ref <1.0)

## 2016-11-04 LAB — FERRITIN: Ferritin: 78 ng/mL (ref 24–336)

## 2016-11-04 NOTE — Patient Instructions (Addendum)
[place] at [facility] Discharge Instructions  RECOMMENDATIONS MADE BY THE CONSULTANT AND ANY TEST RESULTS WILL BE SENT TO YOUR REFERRING PHYSICIAN.  Exam with Dr. Talbert Cage today. Return in 6 months for labs and follow up.   Thank you for choosing [place] at [facility] to provide your oncology and hematology care.  To afford each patient quality time with our provider, please arrive at least 15 minutes before your scheduled appointment time.    If you have a lab appointment with the [facility] please come in thru the  Main Entrance and check in at the main information desk  You need to re-schedule your appointment should you arrive 10 or more minutes late.  We strive to give you quality time with our providers, and arriving late affects you and other patients whose appointments are after yours.  Also, if you no show three or more times for appointments you may be dismissed from the clinic at the providers discretion.     Again, thank you for choosing [facility].  Our hope is that these requests will decrease the amount of time that you wait before being seen by our physicians.       _____________________________________________________________  Should you have questions after your visit to [facility], please contact our office at [phone] between the hours of 8:30 a.m. and 4:30 p.m.  Voicemails left after 4:30 p.m. will not be returned until the following business day.  For prescription refill requests, have your pharmacy contact our office.       Resources For Cancer Patients and their Caregivers ? American Cancer Society: Can assist with transportation, wigs, general needs, runs Look Good Feel Better.        712-199-2595 ? Cancer Care: Provides financial assistance, online support groups, medication/co-pay assistance.  1-800-813-HOPE 218-176-2487) ? [facility] Assists [place] cancer patients and their families through emotional , educational and financial support.  986-794-9871 ? [place] DSS Where to apply for food stamps, Medicaid and utility assistance. (873) 489-0827 ? [facility]: Transportation to medical appointments. (705) 181-1031 ? Social Security Administration: May apply for disability if have a Stage IV cancer. 207-494-7334 920 560 1297 ? [employer], Disability and Transit Services: Assists with nutrition, care and transit needs. [place] Support Programs: @10RELATIVEDAYS @ > Cancer Support Group  2nd Tuesday of the month 1pm-2pm, Journey Room  > Creative Journey  3rd Tuesday of the month 1130am-1pm, Journey Room  > Look Good Feel Better  1st Wednesday of the month 10am-12 noon, Journey Room (Call [place] to register [phone])

## 2016-11-04 NOTE — Progress Notes (Signed)
Brett Gravel, MD [place] [address]   DIAGNOSIS: Iron deficiency Anemia Delayed gastric emptying on pill cam, normal gastric emptying scan on 05/22/2014 Capsule Endoscopy 10/10/2013 no  AVMS, ulcerations, mass  EGD 11/09/2012 history of gastric erosions, history of H pylori and chronic gastritis Colonoscopy 11/09/2012 with polyps  CURRENT THERAPY: Last IV iron 11/20/2015  INTERVAL HISTORY: Brett [name] 66 y.o. male returns for iron deficiency anemia. He has delayed gastric emptying on pill cam, normal gastric emptying scan on 05/22/2014, capsule endoscopy 10/10/2013 no AVMS, ulcerations, mass, EGD 11/09/2012 history of gastric erosions, history of H. pylori and chronic gastritis, colonoscopy 11/09/2012 with polyps. He has ongoing problems with diabetic neuropathy.  [name] is accompanied by his wife. He reports feeling weak, sweating a lot, and having more trouble walking since his last visit to the clinic. He denies bleeding however, he has noticed dark stool. He is not taking an oral iron supplement. He has gained 16 lbs since 09/27/2016.    MEDICAL HISTORY: Past Medical History:  Diagnosis Date  . Depression   . Dextrocardia   . Diabetes mellitus   . H. pylori infection 11/09/2012   treated with pylera  . HTN (hypertension)    Cholesterol  . Iron deficiency anemia, unspecified 10/18/2012    has SPINAL STENOSIS; TENDINITIS, CALCIFIC, SHOULDER, RIGHT; IMPINGEMENT SYNDROME; HIP PAIN; GERD (gastroesophageal reflux disease); Nausea; Iron deficiency anemia due to chronic blood loss; Gastritis, Helicobacter pylori; Insulin-requiring or dependent type II diabetes mellitus ([place]); Malabsorption of iron; Abdominal pain; and Thrombocytosis ([place]) on his problem list.     has No Known Allergies.  Mr. Pastor does not currently have medications on file.  SURGICAL HISTORY: Past Surgical History:  Procedure Laterality Date  . CATARACT EXTRACTION, BILATERAL  2016  .  COLONOSCOPY WITH ESOPHAGOGASTRODUODENOSCOPY (EGD) N/A 11/09/2012   Dr. 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 hpylori on bx- treated with pylera. TCS- normal rectum. 1 diminutive polyp in the mid descending segment. 1-64mm polyp in the mid desending segment o/w the remainder of the colonic mucosa appeared normal. tubular adenoma on bx  . GIVENS CAPSULE STUDY N/A 10/07/2013   no source for anemia or heme positive stool noted  . NECK SURGERY     bone spurs    SOCIAL HISTORY: Social History   Social History  . Marital status: Married    Spouse name: N/A  . Number of children: 0  . Years of education: 7th grade    Occupational History  . landscaping     Social History Main Topics  . Smoking status: Never Smoker  . Smokeless tobacco: Current User    Types: Chew  . Alcohol use No  . Drug use: No  . Sexual activity: Not on file   Other Topics Concern  . Not on file   Social History Narrative  . No narrative on file    FAMILY HISTORY: Family History  Problem Relation Age of Onset  . Diabetes Sister     Fx  . Heart defect Sister     Fx  . Arthritis Sister     Fx  . Asthma Sister     Fx  . Kidney disease Sister     Fx  . Pancreatitis Sister     Review of Systems  Constitutional: Positive for diaphoresis. Negative for weight loss.  HENT: Negative.   Eyes: Negative.   Respiratory: Negative.  Cardiovascular: Negative.   Gastrointestinal: Negative.   Genitourinary: Negative.   Skin: Negative.   Neurological: Positive for weakness.  Endo/Heme/Allergies: Negative.   Psychiatric/Behavioral: Negative.   14 point review of systems was performed and is negative except as detailed under history of present illness and above   PHYSICAL EXAMINATION  ECOG PERFORMANCE STATUS: 1 - Symptomatic but completely ambulatory  Vitals:   11/04/16 1348  BP: (!) 156/57  Pulse: 80  Resp: 20    Temp: 98.7 F (37.1 C)    Physical Exam  Constitutional: He is oriented to person, place, and time and well-developed, well-nourished, and in no distress. No distress.  Ambulates with cane. Able to get on exam table with assistance.  HENT:  Head: Normocephalic and atraumatic.  Mouth/Throat: Oropharynx is clear and moist. No oropharyngeal exudate.  Eyes: Conjunctivae and EOM are normal. Pupils are equal, round, and reactive to light. No scleral icterus.  Neck: Normal range of motion. Neck supple. No JVD present. No tracheal deviation present. No thyromegaly present.  Cardiovascular: Normal rate, regular rhythm, normal heart sounds and intact distal pulses.  Exam reveals no gallop and no friction rub.   No murmur heard. Pulmonary/Chest: Effort normal. No stridor. No respiratory distress.  Abdominal: Soft. Bowel sounds are normal. He exhibits no mass. There is no tenderness. There is no rebound and no guarding.  Musculoskeletal: Normal range of motion. He exhibits no edema.  Lymphadenopathy:    He has no cervical adenopathy.  Neurological: He is alert and oriented to person, place, and time. No cranial nerve deficit. Gait normal. Coordination normal.  Skin: Skin is warm and dry. He is not diaphoretic.  Psychiatric: Memory and judgment normal.  Nursing note and vitals reviewed.   LABORATORY DATA: I have reviewed the data as listed. CBC    Component Value Date/Time   WBC 13.9 (H) 05/06/2016 1314   RBC 4.29 05/06/2016 1314   HGB 12.9 (L) 05/06/2016 1314   HCT 39.4 05/06/2016 1314   PLT 420 (H) 05/06/2016 1314   MCV 91.8 05/06/2016 1314   MCH 30.1 05/06/2016 1314   MCHC 32.7 05/06/2016 1314   RDW 13.5 05/06/2016 1314   LYMPHSABS 3.4 05/06/2016 1314   MONOABS 1.0 05/06/2016 1314   EOSABS 0.2 05/06/2016 1314   BASOSABS 0.0 05/06/2016 1314   CMP     Component Value Date/Time   NA 137 08/12/2015 1257   K 3.9 08/12/2015 1257   CL 99 (L) 08/12/2015 1257   CO2 28 08/12/2015 1257    GLUCOSE 215 (H) 08/12/2015 1257   BUN 8 08/12/2015 1257   CREATININE 0.83 08/12/2015 1257   CREATININE 0.93 08/30/2013 1059   CALCIUM 9.9 08/12/2015 1257   PROT 7.5 05/11/2015 1029   PROT 6.9 09/18/2012   ALBUMIN 4.2 05/11/2015 1029   ALBUMIN 4.5 09/18/2012   AST 18 05/11/2015 1029   AST 17 09/18/2012   ALT 15 (L) 05/11/2015 1029   ALKPHOS 46 05/11/2015 1029   ALKPHOS 42 09/18/2012   BILITOT 0.4 05/11/2015 1029   BILITOT 0.3 09/18/2012   GFRNONAA >60 08/12/2015 1257   GFRAA >60 08/12/2015 1257   Results for PACEY, KOVAC (MRN [id number])   Ref. Range 01/05/2015 12:22 05/11/2015 10:29 08/12/2015 12:57 09/24/2015 09:16 11/10/2015 09:24 02/18/2016 13:38 05/06/2016 13:14  WBC Latest Ref Range: 4.0 - 10.5 K/uL 10.1 11.4 (H) 11.4 (H) 13.5 (H) 11.8 (H) 15.0 (H) 13.9 (H)   CALR  JAK2 EXON12  MPL 05/06/2016 CALR Mutation Detection Result NEGATIVE  No insertions or deletions were detected within the analyzed region  of the calreticulin (CALR) gene.  A negative result does not entirely exclude the possibility of a  clonal population carrying CALR gene mutations that are not covered  by this assay. Results should be interpreted in conjunction with  clinical and laboratory findings for the most accurate  interpretation.   JAK2 GenotypR  Result: NEGATIVE for the JAK2 V617F mutation.  Interpretation: The G to T nucleotide change encoding the V617F  mutation was not detected. This result does not rule out the  presence of the JAK2 mutation at a level below the sensitivity of  detection of this assay, or the presence of other mutations within  JAK2 not detected by this assay. This result does not rule out a  diagnosis of polycythemia vera, essential thrombocythemia or  idiopathic myelofibrosis as the V617F mutation is not detected in all  patients with these disorders.   ASSESSMENT and THERAPY PLAN:  Anemia Iron deficiency No history of  BMBX Diabetes Leukocytosis thrombocytosis  Labs today pending. We will call with these results and make suggestions for treatment at that time.  Reviewed previous myeloproliferative workup with the patient, it was negative.   Return to clinic in 6 months with CBC CMP and iron studies.   No orders of the defined types were placed in this encounter.   All questions were answered. The patient knows to call the clinic with any problems, questions or concerns. We can certainly see the patient much sooner if necessary.  This document serves as a record of services personally performed by Twana First, MD. It was created on her behalf by Arlyce Harman, a trained medical scribe. The creation of this record is based on the scribe's personal observations and the provider's statements to them. This document has been checked and approved by the attending provider.  I have reviewed the above documentation for accuracy and completeness and I agree with the above.  Carlis Abbott  11/04/2016

## 2016-11-07 ENCOUNTER — Other Ambulatory Visit (HOSPITAL_COMMUNITY): Payer: Self-pay | Admitting: Oncology

## 2016-11-08 ENCOUNTER — Encounter: Payer: Self-pay | Admitting: Allergy & Immunology

## 2016-11-08 ENCOUNTER — Ambulatory Visit (INDEPENDENT_AMBULATORY_CARE_PROVIDER_SITE_OTHER): Payer: 59 | Admitting: Allergy & Immunology

## 2016-11-08 VITALS — BP 140/70 | HR 82 | Temp 99.2°F | Resp 17

## 2016-11-08 DIAGNOSIS — J300 Vasomotor rhinitis: Secondary | ICD-10-CM | POA: Diagnosis not present

## 2016-11-08 DIAGNOSIS — J984 Other disorders of lung: Secondary | ICD-10-CM | POA: Diagnosis not present

## 2016-11-08 DIAGNOSIS — Q893 Situs inversus: Secondary | ICD-10-CM | POA: Diagnosis not present

## 2016-11-08 DIAGNOSIS — J4540 Moderate persistent asthma, uncomplicated: Secondary | ICD-10-CM | POA: Diagnosis not present

## 2016-11-08 NOTE — Patient Instructions (Addendum)
1. Restrictive lung disease - improved with Symbicort  - Lung function looks better today compared to the last visit.  - We will not make any changes since you are doing so much better.  - We ordered a chest CT at the last visit, but call Forestine Na to schedule this. - Daily controller medication(s): Symbicort 160/4.5 two puffs twice daily with spacer - Rescue medications: ProAir 4 puffs every 4-6 hours as needed - Asthma control goals:  * Full participation in all desired activities (may need albuterol before activity) * Albuterol use two time or less a week on average (not counting use with activity) * Cough interfering with sleep two time or less a month * Oral steroids no more than once a year * No hospitalizations  2. Chronic vasomotor rhinitis (negative environmental allergy testing)  - Continue with Flonase two sprays per nostril once daily. - Continue with Astelin nasal spray two sprays per nostril 1-2 times daily.  - Add Zyrtec (cetirzine) 10mg  daily, which can help dry up the sinuses.   3. Return in about 3 months (around 02/05/2017).  Please inform us of any Emergency Department visits, hospitalizations, or changes in symptoms. Call us before going to the ED for breathing or allergy symptoms since we might be able to fit you in for a sick visit. Feel free to contact us anytime with any questions, problems, or concerns.  It was a pleasure to see you and your family again today! Best wishes in the [place] Year!   Websites that have reliable patient information: 1. American Academy of Asthma, Allergy, and Immunology: www.aaaai.org 2. Food Allergy Research and Education (FARE): foodallergy.org 3. Mothers of Asthmatics: http://www.asthmacommunitynetwork.org 4. American College of Allergy, Asthma, and Immunology: www.acaai.org

## 2016-11-08 NOTE — Progress Notes (Signed)
FOLLOW UP  Date of Service/Encounter:  11/09/16   Assessment:   Restrictive lung disease  Chronic vasomotor rhinitis  Moderate persistent asthma, uncomplicated  Kartagener syndrome   Asthma Reportables:  Severity: moderate persistent  Risk: high Control: not well controlled     Plan/Recommendations:   1. 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  - We will not make any changes since you are doing so much better.  - We ordered a chest CT at the last visit but obviously there was something lost in the order. - We did call Brett [name] prior to discharge today and scheduled the chest CT for next week. - I am unsure of the etiology of his restrictive lung disease. - Brett [name] has never smoked cigarettes, but it should be noted that his wife as well as his brother who lived with him until his death two years ago did smoke significant amounts in their home.   - COPD is a possibility as well with the history of chronic mucous production, and with his current spirometric values he would be categorized as a severe COPD.  - Bronchiectasis as an underlying cause of the chronic mucous production needs to be ruled out, however, which is why the HRCT of the chest is so important in this diagnostic workup.   - If bronchiectasis is present, he would benefit from a Pulmonology evaluation and possible initiation of chest physiotherapy.  - Daily controller medication(s): Symbicort 160/4.5 two puffs twice daily with spacer - Rescue medications: ProAir 4 puffs every 4-6 hours as needed - Asthma control goals:  * Full participation in all desired activities (may need albuterol before activity) * Albuterol use two time or less a week on average (not counting use with activity) * Cough interfering with sleep two time or less a month * Oral steroids no more than  once a year * No hospitalizations  2. Chronic vasomotor rhinitis (negative environmental allergy testing)  - Continue with Flonase two sprays per nostril once daily. - Continue with Astelin nasal spray two sprays per nostril 1-2 times daily.  - Add Zyrtec (cetirzine) 10mg  daily, which can help dry up the sinuses.   3. Return in about 3 months (around 02/05/2017).    Subjective:   Brett [name] is a 66 y.o. male presenting today for follow up of  Chief Complaint  Patient presents with  . Asthma    Brett [name] has a history of the following: Patient Active Problem List   Diagnosis Date Noted  . 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 Restrictive lung disease 11/08/2016  . 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 Moderate persistent asthma, uncomplicated 0000000  . Thrombocytosis ([place]) 10/09/2014  . Abdominal pain 09/23/2014  . Gastritis, Helicobacter pylori [id number]  . Insulin-requiring or dependent type II diabetes mellitus ([place]) 01/03/2014  . 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 GERD (gastroesophageal reflux disease) 10/18/2012  . Nausea 10/18/2012  . Iron deficiency anemia due to chronic blood loss 10/18/2012  . HIP PAIN 08/31/2010  . SPINAL STENOSIS 06/28/2010  . TENDINITIS, CALCIFIC, SHOULDER, RIGHT 06/14/2010  . IMPINGEMENT SYNDROME 06/14/2010    History obtained from: chart review and the patient and his wife.  Brett [name] was referred by Brett Gravel, MD.     Brett [name] is a 66 y.o. male presenting for a follow up visit. He was last seen in January  2018. He was first evaluated by me in October 2017 for his first visit. At that time, he had restriction noted on his spirometry. We did not do skin testing since his lung function was so poor but a Zone 3 was negative for all allergens. We did start Flonase two sprays per nostril daily as well as cetirizine 10mg  daily. At the time, he and his wife felt that the coughing was related to allergies therefore we did not pursue  the pulmonary aspect of his symptoms as they did not seem ready to broach that subject. At the next visit, they were prepared to address any pulmonary problems. Spirometry remained consistent with restrictive disease but he did have significant reversibility with a 25% increase in the FEV1 and FVC. We started him on Symbicort 160/4.5 two puffs twice daily as well as ProAir as needed. We placed him on a prednisone burst to help control inflammation within the lungs. We also obtained a CXR that showed only mild cardiomegaly but wad otherwise normal. Full pulmonary function testing was performed which showed evidence of restrictive disease; unfortunately DLCO could not be performed due to patient's inability to perform the maneuvers. We also treated him with Augmentin for sinusitis. I ordered a chest CT which unfortunately looks like it was not performed.   Since the last visit, he has mostly done well. He remains on the Symbicort which he and his wife think has provided some relief to his symptoms of SOB and DOE. However he continued to have chronic mucous production that has remained consistent despite Flonase and antihistamines. It remains clear colored and he has had no fever associated with this. The prednisone course seemed to help with the SOB however it did not change the mucous production at all. He does have GERD which he thinks it well controlled and not contributing to his current set of symptoms. Currently he is on omeprazole 40mg  daily. PFTs performed without DLCO unfortunately, but results are below. He had no change with bronchodilator. They never received a call from [facility] re: scheduling a chest CT and this has not been done.   Non-allergic rhinitis seems well controlled with the current regimen of Flonase and Astelin. Dymista was not covered by his insurance. He does need refills on this.     Otherwise, there have been no changes to his past medical history, surgical history, family  history, or social history.    Review of Systems: a 14-point review of systems is pertinent for what is mentioned in HPI.  Otherwise, all other systems were negative. Constitutional: negative other than that listed in the HPI Eyes: negative other than that listed in the HPI Ears, nose, mouth, throat, and face: negative other than that listed in the HPI Respiratory: negative other than that listed in the HPI Cardiovascular: negative other than that listed in the HPI Gastrointestinal: negative other than that listed in the HPI Genitourinary: negative other than that listed in the HPI Integument: negative other than that listed in the HPI Hematologic: negative other than that listed in the HPI Musculoskeletal: negative other than that listed in the HPI Neurological: negative other than that listed in the HPI Allergy/Immunologic: negative other than that listed in the HPI    Objective:   Blood pressure 140/70, pulse 82, temperature 99.2 F (37.3 C), temperature source Oral, resp. rate 17, SpO2 95 %. There is no height or weight on file to calculate BMI.   Physical Exam:  General: Alert, interactive, in no acute  distress. Obese male. High pitched voice.  Eyes: No conjunctival injection present on the right, No conjunctival injection present on the left, PERRL bilaterally, No discharge on the right, No discharge on the left and No Horner-Trantas dots present Ears: Right TM pearly gray with normal light reflex, Left TM pearly gray with normal light reflex, Right TM intact without perforation and Left TM intact without perforation.  Nose/Throat: External nose within normal limits, nasal crease present and septum midline, turbinates edematous and pale with clear discharge, post-pharynx erythematous with cobblestoning in the posterior oropharynx. Tonsils 2+ without exudates Neck: Supple without thyromegaly. Lungs: Decreased breath sounds bilaterally without wheezing, rhonchi or rales.  Increased work of breathing but this appears to be his baseline.  CV: Normal S1/S2, no murmurs. Capillary refill <2 seconds.  Skin: Warm and dry, without lesions or rashes. Neuro:   Grossly intact. No focal deficits appreciated. Responsive to questions.   Diagnostic studies:  Spirometry: results abnormal (FEV1: 1.65/54%, FVC: 2.89/74%, FEV1/FVC: 57%).    Spirometry consistent with mixed obstructive and restrictive disease. Compared to his values from the last visit, the FEV1 has increased from 1.32 to 1.65. The FVC has increased from 2.15 to 2.89.   Allergy Studies: None    Salvatore Marvel, MD [place] [facility] [place]

## 2016-11-11 ENCOUNTER — Encounter (HOSPITAL_COMMUNITY): Payer: Self-pay

## 2016-11-11 ENCOUNTER — Encounter (HOSPITAL_COMMUNITY): Payer: 59 | Attending: Oncology

## 2016-11-11 ENCOUNTER — Encounter: Payer: Self-pay | Admitting: Allergy & Immunology

## 2016-11-11 VITALS — BP 159/70 | HR 84 | Temp 98.4°F | Resp 18

## 2016-11-11 DIAGNOSIS — D500 Iron deficiency anemia secondary to blood loss (chronic): Secondary | ICD-10-CM

## 2016-11-11 DIAGNOSIS — D473 Essential (hemorrhagic) thrombocythemia: Secondary | ICD-10-CM | POA: Insufficient documentation

## 2016-11-11 MED ORDER — SODIUM CHLORIDE 0.9 % IV SOLN
Freq: Once | INTRAVENOUS | Status: AC
  Administered 2016-11-11: 14:00:00 via INTRAVENOUS

## 2016-11-11 MED ORDER — FERUMOXYTOL INJECTION 510 MG/17 ML
510.0000 mg | Freq: Once | INTRAVENOUS | Status: AC
  Administered 2016-11-11: 510 mg via INTRAVENOUS
  Filled 2016-11-11: qty 17

## 2016-11-11 NOTE — Patient Instructions (Signed)
[facility] at [facility] Discharge Instructions  RECOMMENDATIONS MADE BY THE CONSULTANT AND ANY TEST RESULTS WILL BE SENT TO YOUR REFERRING PHYSICIAN.  IV iron  Thank you for choosing [facility] at [facility] to provide your oncology and hematology care.  To afford each patient quality time with our provider, please arrive at least 15 minutes before your scheduled appointment time.    If you have a lab appointment with the Cancer Center please come in thru the  Main Entrance and check in at the main information desk  You need to re-schedule your appointment should you arrive 10 or more minutes late.  We strive to give you quality time with our providers, and arriving late affects you and other patients whose appointments are after yours.  Also, if you no show three or more times for appointments you may be dismissed from the clinic at the providers discretion.     Again, thank you for choosing [facility].  Our hope is that these requests will decrease the amount of time that you wait before being seen by our physicians.       _____________________________________________________________  Should you have questions after your visit to [facility], please contact our office at [phone] between the hours of 8:30 a.m. and 4:30 p.m.  Voicemails left after 4:30 p.m. will not be returned until the following business day.  For prescription refill requests, have your pharmacy contact our office.       Resources For Cancer Patients and their Caregivers ? American Cancer Society: Can assist with transportation, wigs, general needs, runs Look Good Feel Better.        1-888-227-6333 ? Cancer Care: Provides financial assistance, online support groups, medication/co-pay assistance.  1-800-813-HOPE (4673) ? [facility] Assists [place] cancer patients and their families through emotional ,  educational and financial support.  336-427-4357 ? [place] DSS Where to apply for food stamps, Medicaid and utility assistance. 336-342-1394 ? RCATS: Transportation to medical appointments. 336-347-2287 ? Social Security Administration: May apply for disability if have a Stage IV cancer. 336-342-7796 1-800-772-1213 ? [place] A[employer], Disability and Transit Services: Assists with nutrition, care and transit needs. 336-349-2343  Cancer Center Support Programs: [email or web address]@ > Cancer Support Group  2nd Tuesday of the month 1pm-2pm, Journey Room  > Creative Journey  3rd Tuesday of the month 1130am-1pm, Journey Room  > Look Good Feel Better  1st Wednesday of the month 10am-12 noon, Journey Room (Call American Cancer Society to register 1-800-395-5775)    

## 2016-11-11 NOTE — Progress Notes (Signed)
Brett [name] tolerated Feraheme well without complaints or incident. VSS upon discharge. Pt discharged self ambulatory accompanied by his wife

## 2016-11-11 NOTE — Progress Notes (Signed)
Patient tolerated infusion well.  VSS.  Patient ambulatory and stable upon discharge from clinic.   

## 2016-11-14 ENCOUNTER — Ambulatory Visit (HOSPITAL_COMMUNITY): Payer: 59

## 2016-11-18 ENCOUNTER — Ambulatory Visit (HOSPITAL_COMMUNITY): Payer: 59

## 2016-11-25 ENCOUNTER — Ambulatory Visit (HOSPITAL_COMMUNITY)
Admission: RE | Admit: 2016-11-25 | Discharge: 2016-11-25 | Disposition: A | Discharge status: Home / Self Care | Payer: 59 | Source: Ambulatory Visit | Attending: Allergy & Immunology | Admitting: Allergy & Immunology

## 2016-11-25 DIAGNOSIS — J984 Other disorders of lung: Secondary | ICD-10-CM | POA: Diagnosis not present

## 2016-11-25 DIAGNOSIS — J479 Bronchiectasis, uncomplicated: Secondary | ICD-10-CM | POA: Insufficient documentation

## 2016-11-25 DIAGNOSIS — Q893 Situs inversus: Secondary | ICD-10-CM | POA: Diagnosis not present

## 2016-11-25 DIAGNOSIS — I700 Atherosclerosis of aorta: Secondary | ICD-10-CM | POA: Insufficient documentation

## 2016-11-25 DIAGNOSIS — R918 Other nonspecific abnormal finding of lung field: Secondary | ICD-10-CM | POA: Insufficient documentation

## 2016-11-25 DIAGNOSIS — I2510 Atherosclerotic heart disease of native coronary artery without angina pectoris: Secondary | ICD-10-CM | POA: Diagnosis not present

## 2016-11-29 ENCOUNTER — Telehealth: Payer: Self-pay | Admitting: Allergy & Immunology

## 2016-11-29 DIAGNOSIS — J479 Bronchiectasis, uncomplicated: Secondary | ICD-10-CM

## 2016-11-29 NOTE — Telephone Encounter (Signed)
Referral sent 

## 2016-11-29 NOTE — Telephone Encounter (Signed)
I contacted Mr. Sonnenberg to discuss his chest CT results. Due to the bronchiectasis, I recommended that we refer him back to Dr. Luan Pulling for management since bronchiectasis is not my forte. Patient in agreement.  Salvatore Marvel, MD [facility]

## 2016-11-29 NOTE — Telephone Encounter (Signed)
Thanks, Dee! 

## 2017-02-07 ENCOUNTER — Ambulatory Visit: Payer: 59 | Admitting: Allergy & Immunology

## 2017-05-05 ENCOUNTER — Encounter (HOSPITAL_COMMUNITY): Payer: 59 | Attending: Oncology | Admitting: Oncology

## 2017-05-05 ENCOUNTER — Encounter (HOSPITAL_COMMUNITY): Payer: Self-pay

## 2017-05-05 ENCOUNTER — Encounter (HOSPITAL_COMMUNITY): Payer: 59

## 2017-05-05 DIAGNOSIS — D500 Iron deficiency anemia secondary to blood loss (chronic): Secondary | ICD-10-CM

## 2017-05-05 DIAGNOSIS — D75839 Thrombocytosis, unspecified: Secondary | ICD-10-CM

## 2017-05-05 DIAGNOSIS — D473 Essential (hemorrhagic) thrombocythemia: Secondary | ICD-10-CM

## 2017-05-05 DIAGNOSIS — E1140 Type 2 diabetes mellitus with diabetic neuropathy, unspecified: Secondary | ICD-10-CM | POA: Diagnosis not present

## 2017-05-05 DIAGNOSIS — D509 Iron deficiency anemia, unspecified: Secondary | ICD-10-CM | POA: Diagnosis not present

## 2017-05-05 LAB — COMPREHENSIVE METABOLIC PANEL
ALK PHOS: 38 U/L (ref 38–126)
ALT: 17 U/L (ref 17–63)
AST: 18 U/L (ref 15–41)
Albumin: 4.2 g/dL (ref 3.5–5.0)
Anion gap: 8 (ref 5–15)
BUN: 10 mg/dL (ref 6–20)
CALCIUM: 9.5 mg/dL (ref 8.9–10.3)
CHLORIDE: 103 mmol/L (ref 101–111)
CO2: 28 mmol/L (ref 22–32)
CREATININE: 1.07 mg/dL (ref 0.61–1.24)
Glucose, Bld: 122 mg/dL — ABNORMAL HIGH (ref 65–99)
Potassium: 4.3 mmol/L (ref 3.5–5.1)
SODIUM: 139 mmol/L (ref 135–145)
Total Bilirubin: 0.2 mg/dL — ABNORMAL LOW (ref 0.3–1.2)
Total Protein: 7.2 g/dL (ref 6.5–8.1)

## 2017-05-05 LAB — IRON AND TIBC
Iron: 93 ug/dL (ref 45–182)
Saturation Ratios: 18 % (ref 17.9–39.5)
TIBC: 512 ug/dL — AB (ref 250–450)
UIBC: 419 ug/dL

## 2017-05-05 LAB — CBC WITH DIFFERENTIAL/PLATELET
BASOS ABS: 0 10*3/uL (ref 0.0–0.1)
Basophils Relative: 0 %
Eosinophils Absolute: 0.1 10*3/uL (ref 0.0–0.7)
Eosinophils Relative: 1 %
HCT: 37.7 % — ABNORMAL LOW (ref 39.0–52.0)
HEMOGLOBIN: 12.4 g/dL — AB (ref 13.0–17.0)
LYMPHS PCT: 32 %
Lymphs Abs: 3.3 10*3/uL (ref 0.7–4.0)
MCH: 29.4 pg (ref 26.0–34.0)
MCHC: 32.9 g/dL (ref 30.0–36.0)
MCV: 89.3 fL (ref 78.0–100.0)
Monocytes Absolute: 0.8 10*3/uL (ref 0.1–1.0)
Monocytes Relative: 8 %
NEUTROS PCT: 59 %
Neutro Abs: 6.3 10*3/uL (ref 1.7–7.7)
Platelets: 415 10*3/uL — ABNORMAL HIGH (ref 150–400)
RBC: 4.22 MIL/uL (ref 4.22–5.81)
RDW: 12.8 % (ref 11.5–15.5)
WBC: 10.4 10*3/uL (ref 4.0–10.5)

## 2017-05-05 LAB — FERRITIN: Ferritin: 114 ng/mL (ref 24–336)

## 2017-05-05 NOTE — Patient Instructions (Signed)
[facility] at [facility]  Discharge Instructions: You saw Dr. Zhou today.  _______________________________________________________________  Thank you for choosing [facility] at [facility] to provide your oncology and hematology care.  To afford each patient quality time with our providers, please arrive at least 15 minutes before your scheduled appointment.  You need to re-schedule your appointment if you arrive 10 or more minutes late.  We strive to give you quality time with our providers, and arriving late affects you and other patients whose appointments are after yours.  Also, if you no show three or more times for appointments you may be dismissed from the clinic.  Again, thank you for choosing  [facility] Center at [facility]. Our hope is that these requests will allow you access to exceptional care and in a timely manner. _______________________________________________________________  If you have questions after your visit, please contact our office at [phone] between the hours of 8:30 a.m. and 5:00 p.m. Voicemails left after 4:30 p.m. will not be returned until the following business day. _______________________________________________________________  For prescription refill requests, have your pharmacy contact our office. _______________________________________________________________  Recommendations made by the consultant and any test results will be sent to your referring physician. _______________________________________________________________ 

## 2017-05-05 NOTE — Progress Notes (Signed)
Brett Gravel, MD [address] [place] [address]   DIAGNOSIS: Iron deficiency Anemia Delayed gastric emptying on pill cam, normal gastric emptying scan on 05/22/2014 Capsule Endoscopy 10/10/2013 no  AVMS, ulcerations, mass  EGD 11/09/2012 history of gastric erosions, history of H pylori and chronic gastritis Colonoscopy 11/09/2012 with polyps  CURRENT THERAPY: Last IV iron 11/20/2015  INTERVAL HISTORY: Brett [name] 66 y.o. male returns for iron deficiency anemia. He has delayed gastric emptying on pill cam, normal gastric emptying scan on 05/22/2014, capsule endoscopy 10/10/2013 no AVMS, ulcerations, mass, EGD 11/09/2012 history of gastric erosions, history of H. pylori and chronic gastritis, colonoscopy 11/09/2012 with polyps. He has ongoing problems with diabetic neuropathy.  Brett [name] is accompanied by his wife. He continues to complain of fatigue and low energy. He denies any chest pain, shortness of breath, abdominal pain, focal weakness. He has occasional constipation. He denies any melena, hematochezia, hematuria.   MEDICAL HISTORY: Past Medical History:  Diagnosis Date  . Depression   . Dextrocardia   . Diabetes mellitus   . H. pylori infection 11/09/2012   treated with pylera  . HTN (hypertension)    Cholesterol  . 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     has No Known Allergies.  Brett [name] had no medications administered during this visit.  SURGICAL  HISTORY: Past Surgical History:  Procedure Laterality Date  . CATARACT EXTRACTION, BILATERAL  2016  . COLONOSCOPY WITH ESOPHAGOGASTRODUODENOSCOPY (EGD) N/A 11/09/2012   Dr. 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 hpylori on bx- treated with pylera. TCS- normal rectum. 1 diminutive polyp in the mid descending segment. 1-44mm polyp in the mid desending segment o/w the remainder of the colonic mucosa appeared normal. tubular adenoma on bx  . GIVENS CAPSULE STUDY N/A 10/07/2013   no source for anemia or heme positive stool noted  . NECK SURGERY     bone spurs    SOCIAL HISTORY: Social History   Social History  . Marital status: Married    Spouse name: N/A  . Number of children: 0  . Years of education: 7th grade    Occupational History  . landscaping     Social History Main Topics  . Smoking status: Never Smoker  . Smokeless tobacco: Current User    Types: Chew  . Alcohol use No  . Drug use: No  . Sexual activity: Not on file   Other Topics Concern  . Not on file   Social History Narrative  . No narrative on file    FAMILY HISTORY: Family History  Problem Relation Age of Onset  . Diabetes Sister        Fx  . Heart defect Sister        Fx  . Arthritis Sister        Fx  . Asthma Sister        Fx  . Kidney disease Sister        Fx  . Pancreatitis Sister     Review of Systems  Constitutional: Negative for diaphoresis and weight  loss.       Low energy  HENT: Negative.   Eyes: Negative.   Respiratory: Negative.   Cardiovascular: Negative.   Gastrointestinal: Negative.   Genitourinary: Negative.   Skin: Negative.   Neurological: Negative for weakness.  Endo/Heme/Allergies: Negative.   Psychiatric/Behavioral: Negative.   14 point review of systems was performed and is negative except as detailed under history of present illness and above   PHYSICAL EXAMINATION  ECOG  PERFORMANCE STATUS: 1 - Symptomatic but completely ambulatory Vitals reviewed.  Physical Exam  Constitutional: He is oriented to person, place, and time and well-developed, well-nourished, and in no distress. No distress.  Ambulates with cane. Able to get on exam table with assistance.  HENT:  Head: Normocephalic and atraumatic.  Mouth/Throat: Oropharynx is clear and moist. No oropharyngeal exudate.  Eyes: Pupils are equal, round, and reactive to light. Conjunctivae and EOM are normal. No scleral icterus.  Neck: Normal range of motion. Neck supple. No JVD present. No tracheal deviation present. No thyromegaly present.  Cardiovascular: Normal rate, regular rhythm, normal heart sounds and intact distal pulses.  Exam reveals no gallop and no friction rub.   No murmur heard. Pulmonary/Chest: Effort normal. No stridor. No respiratory distress.  Abdominal: Soft. Bowel sounds are normal. He exhibits no mass. There is no tenderness. There is no rebound and no guarding.  Musculoskeletal: Normal range of motion. He exhibits no edema.  Lymphadenopathy:    He has no cervical adenopathy.  Neurological: He is alert and oriented to person, place, and time. No cranial nerve deficit. Gait normal. Coordination normal.  Skin: Skin is warm and dry. He is not diaphoretic.  Psychiatric: Memory and judgment normal.  Nursing note and vitals reviewed.   LABORATORY DATA: I have reviewed the data as listed. CBC    Component Value Date/Time   WBC 10.4 05/05/2017 1410   RBC 4.22 05/05/2017 1410   HGB 12.4 (L) 05/05/2017 1410   HCT 37.7 (L) 05/05/2017 1410   PLT 415 (H) 05/05/2017 1410   MCV 89.3 05/05/2017 1410   MCH 29.4 05/05/2017 1410   MCHC 32.9 05/05/2017 1410   RDW 12.8 05/05/2017 1410   LYMPHSABS 3.3 05/05/2017 1410   MONOABS 0.8 05/05/2017 1410   EOSABS 0.1 05/05/2017 1410   BASOSABS 0.0 05/05/2017 1410   CMP     Component Value Date/Time   NA 137 08/12/2015 1257   K 3.9 08/12/2015 1257    CL 99 (L) 08/12/2015 1257   CO2 28 08/12/2015 1257   GLUCOSE 215 (H) 08/12/2015 1257   BUN 8 08/12/2015 1257   CREATININE 0.83 08/12/2015 1257   CREATININE 0.93 08/30/2013 1059   CALCIUM 9.9 08/12/2015 1257   PROT 7.5 05/11/2015 1029   PROT 6.9 09/18/2012   ALBUMIN 4.2 05/11/2015 1029   ALBUMIN 4.5 09/18/2012   AST 18 05/11/2015 1029   AST 17 09/18/2012   ALT 15 (L) 05/11/2015 1029   ALKPHOS 46 05/11/2015 1029   ALKPHOS 42 09/18/2012   BILITOT 0.4 05/11/2015 1029   BILITOT 0.3 09/18/2012   GFRNONAA >60 08/12/2015 1257   GFRAA >60 08/12/2015 1257   Results for ELMON, SHADER (MRN [id number])   Ref. Range 01/05/2015 12:22 05/11/2015 10:29 08/12/2015 12:57 09/24/2015 09:16 11/10/2015 09:24 02/18/2016 13:38 05/06/2016 13:14  WBC Latest Ref Range: 4.0 - 10.5 K/uL 10.1 11.4 (H) 11.4 (H) 13.5 (H) 11.8 (H) 15.0 (H) 13.9 (H)   CALR  JAK2 EXON12  MPL 05/06/2016 CALR Mutation Detection Result NEGATIVE  No  insertions or deletions were detected within the analyzed region  of the calreticulin (CALR) gene.  A negative result does not entirely exclude the possibility of a  clonal population carrying CALR gene mutations that are not covered  by this assay. Results should be interpreted in conjunction with  clinical and laboratory findings for the most accurate  interpretation.   JAK2 GenotypR  Result: NEGATIVE for the JAK2 V617F mutation.  Interpretation: The G to T nucleotide change encoding the V617F  mutation was not detected. This result does not rule out the  presence of the JAK2 mutation at a level below the sensitivity of  detection of this assay, or the presence of other mutations within  JAK2 not detected by this assay. This result does not rule out a  diagnosis of polycythemia vera, essential thrombocythemia or  idiopathic myelofibrosis as the V617F mutation is not detected in all  patients with these disorders.   ASSESSMENT and THERAPY PLAN:  Anemia Iron deficiency No history  of BMBX Diabetes Leukocytosis thrombocytosis  -Previous myeloproliferative workup was negative.  -Hemoglobin improved. Its been holding steady in the 11-12 g/dL range. Iron studies pending.  -Recommended for patient to get his TSH and testosterone checked for evaluation of his persistent fatigue when he sees his PCP next month. It may be worthwhile to rule out sleep apnea at that time as well.   Return to clinic in 6 months with CBC CMP and iron studies.   Orders Placed This Encounter  Procedures  . CBC with Differential    Standing Status:   Future    Standing Expiration Date:   05/05/2018  . Comprehensive metabolic panel    Standing Status:   Future    Standing Expiration Date:   05/05/2018  . Iron and TIBC    Standing Status:   Future    Standing Expiration Date:   05/05/2018  . Ferritin    Standing Status:   Future    Standing Expiration Date:   05/05/2018    All questions were answered. The patient knows to call the clinic with any problems, questions or concerns. We can certainly see the patient much sooner if necessary.  Twana First, MD  05/05/2017

## 2017-10-09 ENCOUNTER — Encounter: Payer: Self-pay | Admitting: Internal Medicine

## 2017-10-19 ENCOUNTER — Other Ambulatory Visit: Payer: Self-pay | Admitting: Allergy & Immunology

## 2017-10-25 ENCOUNTER — Other Ambulatory Visit: Payer: Self-pay | Admitting: Allergy & Immunology

## 2017-11-06 ENCOUNTER — Ambulatory Visit (HOSPITAL_COMMUNITY): Payer: 59

## 2017-11-06 ENCOUNTER — Other Ambulatory Visit (HOSPITAL_COMMUNITY): Payer: 59

## 2017-11-10 ENCOUNTER — Encounter (HOSPITAL_COMMUNITY): Payer: Self-pay | Admitting: Internal Medicine

## 2017-11-10 ENCOUNTER — Inpatient Hospital Stay (HOSPITAL_COMMUNITY): Payer: 59 | Attending: Internal Medicine

## 2017-11-10 ENCOUNTER — Inpatient Hospital Stay (HOSPITAL_BASED_OUTPATIENT_CLINIC_OR_DEPARTMENT_OTHER): Payer: 59 | Admitting: Internal Medicine

## 2017-11-10 VITALS — BP 153/64 | HR 85 | Temp 97.6°F | Resp 18 | Wt 250.0 lb

## 2017-11-10 DIAGNOSIS — D473 Essential (hemorrhagic) thrombocythemia: Secondary | ICD-10-CM | POA: Insufficient documentation

## 2017-11-10 DIAGNOSIS — F329 Major depressive disorder, single episode, unspecified: Secondary | ICD-10-CM | POA: Diagnosis not present

## 2017-11-10 DIAGNOSIS — D72819 Decreased white blood cell count, unspecified: Secondary | ICD-10-CM

## 2017-11-10 DIAGNOSIS — J984 Other disorders of lung: Secondary | ICD-10-CM | POA: Diagnosis not present

## 2017-11-10 DIAGNOSIS — Z79899 Other long term (current) drug therapy: Secondary | ICD-10-CM | POA: Diagnosis not present

## 2017-11-10 DIAGNOSIS — J4540 Moderate persistent asthma, uncomplicated: Secondary | ICD-10-CM | POA: Insufficient documentation

## 2017-11-10 DIAGNOSIS — I10 Essential (primary) hypertension: Secondary | ICD-10-CM

## 2017-11-10 DIAGNOSIS — K219 Gastro-esophageal reflux disease without esophagitis: Secondary | ICD-10-CM | POA: Diagnosis not present

## 2017-11-10 DIAGNOSIS — M4800 Spinal stenosis, site unspecified: Secondary | ICD-10-CM

## 2017-11-10 DIAGNOSIS — E119 Type 2 diabetes mellitus without complications: Secondary | ICD-10-CM | POA: Diagnosis not present

## 2017-11-10 DIAGNOSIS — D500 Iron deficiency anemia secondary to blood loss (chronic): Secondary | ICD-10-CM

## 2017-11-10 DIAGNOSIS — Q893 Situs inversus: Secondary | ICD-10-CM | POA: Diagnosis not present

## 2017-11-10 DIAGNOSIS — Z794 Long term (current) use of insulin: Secondary | ICD-10-CM | POA: Diagnosis not present

## 2017-11-10 LAB — CBC WITH DIFFERENTIAL/PLATELET
BASOS ABS: 0 10*3/uL (ref 0.0–0.1)
Basophils Relative: 0 %
EOS PCT: 1 %
Eosinophils Absolute: 0.1 10*3/uL (ref 0.0–0.7)
HCT: 36.5 % — ABNORMAL LOW (ref 39.0–52.0)
Hemoglobin: 11.6 g/dL — ABNORMAL LOW (ref 13.0–17.0)
LYMPHS PCT: 28 %
Lymphs Abs: 3 10*3/uL (ref 0.7–4.0)
MCH: 29.1 pg (ref 26.0–34.0)
MCHC: 31.8 g/dL (ref 30.0–36.0)
MCV: 91.7 fL (ref 78.0–100.0)
MONO ABS: 0.7 10*3/uL (ref 0.1–1.0)
MONOS PCT: 6 %
Neutro Abs: 7.1 10*3/uL (ref 1.7–7.7)
Neutrophils Relative %: 65 %
PLATELETS: 401 10*3/uL — AB (ref 150–400)
RBC: 3.98 MIL/uL — ABNORMAL LOW (ref 4.22–5.81)
RDW: 13.4 % (ref 11.5–15.5)
WBC: 10.9 10*3/uL — ABNORMAL HIGH (ref 4.0–10.5)

## 2017-11-10 LAB — COMPREHENSIVE METABOLIC PANEL
ALK PHOS: 35 U/L — AB (ref 38–126)
ALT: 15 U/L — AB (ref 17–63)
AST: 21 U/L (ref 15–41)
Albumin: 4.1 g/dL (ref 3.5–5.0)
Anion gap: 13 (ref 5–15)
BILIRUBIN TOTAL: 0.2 mg/dL — AB (ref 0.3–1.2)
BUN: 11 mg/dL (ref 6–20)
CALCIUM: 9.7 mg/dL (ref 8.9–10.3)
CO2: 25 mmol/L (ref 22–32)
CREATININE: 1.03 mg/dL (ref 0.61–1.24)
Chloride: 101 mmol/L (ref 101–111)
GFR calc non Af Amer: >60 mL/min (ref >60)
Glucose, Bld: 157 mg/dL — ABNORMAL HIGH (ref 65–99)
Potassium: 3.9 mmol/L (ref 3.5–5.1)
SODIUM: 139 mmol/L (ref 135–145)
TOTAL PROTEIN: 7.3 g/dL (ref 6.5–8.1)

## 2017-11-10 LAB — IRON AND TIBC
Iron: 56 ug/dL (ref 45–182)
Saturation Ratios: 11 % — ABNORMAL LOW (ref 17.9–39.5)
TIBC: 496 ug/dL — AB (ref 250–450)
UIBC: 440 ug/dL

## 2017-11-10 LAB — FERRITIN: Ferritin: 106 ng/mL (ref 24–336)

## 2017-11-10 NOTE — Patient Instructions (Addendum)
[place] at [facility] Discharge Instructions   You were seen today by Dr. Zoila Shutter We will get you referred to an ENT physician for your vertigo / inner ear / mouth issues Follow up with our office in 6 months with lab work   Thank you for choosing [place] at [facility] to provide your oncology and hematology care.  To afford each patient quality time with our provider, please arrive at least 15 minutes before your scheduled appointment time.    If you have a lab appointment with the [facility] please come in thru the  Main Entrance and check in at the main information desk  You need to re-schedule your appointment should you arrive 10 or more minutes late.  We strive to give you quality time with our providers, and arriving late affects you and other patients whose appointments are after yours.  Also, if you no show three or more times for appointments you may be dismissed from the clinic at the providers discretion.     Again, thank you for choosing [facility].  Our hope is that these requests will decrease the amount of time that you wait before being seen by our physicians.       _____________________________________________________________  Should you have questions after your visit to [facility], please contact our office at [phone] between the hours of 8:30 a.m. and 4:30 p.m.  Voicemails left after 4:30 p.m. will not be returned until the following business day.  For prescription refill requests, have your pharmacy contact our office.       Resources For Cancer Patients and their Caregivers ? American Cancer Society: Can assist with transportation, wigs, general needs, runs Look Good Feel Better.        603-008-6380 ? Cancer Care: Provides financial assistance, online support groups, medication/co-pay assistance.  1-800-813-HOPE 587-476-3429) ? [facility] Assists  [place] cancer patients and their families through emotional , educational and financial support.  843-413-3221 ? [place] DSS Where to apply for food stamps, Medicaid and utility assistance. (437)668-2359 ? RCATS: Transportation to medical appointments. (907) 397-6447 ? Social Security Administration: May apply for disability if have a Stage IV cancer. (213) 357-4594 254-662-3689 ? [employer], Disability and Transit Services: Assists with nutrition, care and transit needs. [place] Support Programs:   > Cancer Support Group  2nd Tuesday of the month 1pm-2pm, Journey Room   > Creative Journey  3rd Tuesday of the month 1130am-1pm, Journey Room

## 2017-11-28 ENCOUNTER — Encounter (HOSPITAL_COMMUNITY): Payer: Self-pay | Admitting: Lab

## 2017-11-28 NOTE — Progress Notes (Unsigned)
Referral sent to Dr Benjamine Mola.  Records faxed on 3/19

## 2017-12-03 NOTE — Progress Notes (Signed)
Diagnosis Iron deficiency anemia due to chronic blood loss - Plan: CBC with Differential/Platelet, Comprehensive metabolic panel, Ferritin, Lactate dehydrogenase  Staging Cancer Staging No matching staging information was found for the patient.  Assessment and Plan:  1.  IDA.  Labs today 11/10/2017  show hemoglobin 11.6 ferritin 106.  He will have repeat labs in September 2019.    2.  Leukocytosis and thrombocytosis.  White count 10.9 platelets 401,000.  He has undergone myeloproliferative workup in the past with Dr. Talbert Cage which was negative.  Will repeat labs in September 2019.  We will continue to repeat labs he has undergone myeloproliferative evaluation in the past with Dr. Hilary Hertz.  3.  Health maintenance.  He has undergone GI evaluation in the past.  Continue to follow with GI as recommended.  Interval History: 67 year old male with iron deficiency anemia.  Last treated with IV iron in March 2018.  Current Status:  Patient is seen today for follow-up.  He is here today to go over lab studies.  Problem List Patient Active Problem List   Diagnosis Date Noted  . 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 Moderate persistent asthma, uncomplicated [V67.20] 94/70/9628  . Thrombocytosis ([place]) [D47.3] 10/09/2014  . Abdominal pain [R10.9] 09/23/2014  . Gastritis, Helicobacter pylori [Z66.29, B96.81] 01/03/2014  . Insulin-requiring or dependent type II diabetes mellitus ([place]) [E11.9, Z79.4] 01/03/2014  . 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 GERD (gastroesophageal reflux disease) [K21.9] 10/18/2012  . Nausea [R11.0] 10/18/2012  . Iron deficiency anemia due to chronic blood loss [D50.0] 10/18/2012  . HIP PAIN [M25.559] 08/31/2010  . SPINAL STENOSIS [M48.00] 06/28/2010  . TENDINITIS, CALCIFIC, SHOULDER, RIGHT [M75.30] 06/14/2010  . IMPINGEMENT SYNDROME [M75.80] 06/14/2010    Past Medical  History Past Medical History:  Diagnosis Date  . Depression   . Dextrocardia   . Diabetes mellitus   . H. pylori infection 11/09/2012   treated with pylera  . HTN (hypertension)    Cholesterol  . Iron deficiency anemia, unspecified 10/18/2012    Past Surgical History Past Surgical History:  Procedure Laterality Date  . CATARACT EXTRACTION, BILATERAL  2016  . COLONOSCOPY WITH ESOPHAGOGASTRODUODENOSCOPY (EGD) N/A 11/09/2012   Dr. 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 hpylori on bx- treated with pylera. TCS- normal rectum. 1 diminutive polyp in the mid descending segment. 1-71m polyp in the mid desending segment o/w the remainder of the colonic mucosa appeared normal. tubular adenoma on bx  . GIVENS CAPSULE STUDY N/A 10/07/2013   no source for anemia or heme positive stool noted  . NECK SURGERY     bone spurs    Family History Family History  Problem Relation Age of Onset  . Diabetes Sister        Fx  . Heart defect Sister        Fx  . Arthritis Sister        Fx  . Asthma Sister        Fx  . Kidney disease Sister        Fx  . Pancreatitis Sister      Social History  reports that he has never smoked. His smokeless tobacco use includes chew. He reports that he does not drink alcohol or use drugs.  Medications  Current Outpatient Medications:  .  albuterol (PROAIR HFA) 108 (90 Base) MCG/ACT inhaler, Inhale 2 puffs into the  lungs every 4 (four) hours as needed for wheezing or shortness of breath., Disp: 1 Inhaler, Rfl: 2 .  azelastine (ASTELIN) 0.1 % nasal spray, Place 2 sprays into both nostrils 2 (two) times daily. Use in each nostril as directed, Disp: 30 mL, Rfl: 5 .  B-D ULTRAFINE III SHORT PEN 31G X 8 MM MISC, , Disp: , Rfl:  .  budesonide-formoterol (SYMBICORT) 160-4.5 MCG/ACT inhaler, Inhale 2 puffs into the lungs 2 (two) times daily., Disp: 1 Inhaler, Rfl: 5 .  DEXILANT 60 MG  capsule, TAKE (1) CAPSULE BY MOUTH EVERY DAY., Disp: 90 capsule, Rfl: 3 .  DULoxetine (CYMBALTA) 60 MG capsule, Take 60 mg by mouth daily., Disp: , Rfl:  .  fenofibrate (TRICOR) 145 MG tablet, Take 145 mg by mouth daily., Disp: , Rfl:  .  folic acid (FOLVITE) 1 MG tablet, Take 1 mg by mouth daily., Disp: , Rfl:  .  insulin detemir (LEVEMIR) 100 UNIT/ML injection, Inject 55 Units into the skin at bedtime. , Disp: , Rfl:  .  LEVEMIR FLEXTOUCH 100 UNIT/ML Pen, Inject 100 Units as directed at bedtime., Disp: , Rfl:  .  metFORMIN (GLUCOPHAGE) 1000 MG tablet, Take 1,000 mg by mouth 2 (two) times daily with a meal., Disp: , Rfl:  .  Multiple Vitamin (MULTIVITAMIN) tablet, Take 1 tablet by mouth daily., Disp: , Rfl:  .  pioglitazone (ACTOS) 45 MG tablet, Take 45 mg by mouth daily., Disp: , Rfl:  .  pregabalin (LYRICA) 50 MG capsule, Take 50 mg by mouth 3 (three) times daily., Disp: , Rfl:  .  promethazine (PHENERGAN) 12.5 MG tablet, , Disp: , Rfl:  .  rosuvastatin (CRESTOR) 20 MG tablet, Take 20 mg by mouth at bedtime. , Disp: , Rfl:  .  sitaGLIPtin (JANUVIA) 100 MG tablet, Take 50-100 mg by mouth daily., Disp: , Rfl:  .  traMADol (ULTRAM) 50 MG tablet, Take 50-100 mg by mouth every 6 (six) hours as needed. For pain, Disp: , Rfl:   Allergies Patient has no known allergies.  Review of Systems Review of Systems - Oncology ROS as per HPI otherwise 12 point ROS is negative.   Physical Exam  Vitals Wt Readings from Last 3 Encounters:  11/10/17 250 lb (113.4 kg)  11/04/16 246 lb (111.6 kg)  09/27/16 230 lb (104.3 kg)   Temp Readings from Last 3 Encounters:  11/10/17 97.6 F (36.4 C) (Oral)  11/11/16 98.4 F (36.9 C) (Oral)  11/08/16 99.2 F (37.3 C) (Oral)   BP Readings from Last 3 Encounters:  11/10/17 (!) 153/64  11/11/16 (!) 159/70  11/08/16 140/70   Pulse Readings from Last 3 Encounters:  11/10/17 85  11/11/16 84  11/08/16 82   Constitutional: Well-developed, well-nourished,  and in no distress.   HENT: Head: Normocephalic and atraumatic.  Mouth/Throat: No oropharyngeal exudate. Mucosa moist. Eyes: Pupils are equal, round, and reactive to light. Conjunctivae are normal. No scleral icterus.  Neck: Normal range of motion. Neck supple. No JVD present.  Cardiovascular: Normal rate, regular rhythm and normal heart sounds.  Exam reveals no gallop and no friction rub.   No murmur heard. Pulmonary/Chest: Effort normal and breath sounds normal. No respiratory distress. No wheezes.No rales.  Abdominal: Soft. Bowel sounds are normal. No distension. There is no tenderness. There is no guarding.  Musculoskeletal: No edema or tenderness.  Lymphadenopathy: No cervical, axillary or supraclavicular adenopathy.  Neurological: Alert and oriented to person, place, and time. No cranial nerve deficit.  Skin: Skin  is warm and dry. No rash noted. No erythema. No pallor.  Psychiatric: Affect and judgment normal.   Labs Lab on 11/10/2017  Component Date Value Ref Range Status  . WBC 11/10/2017 10.9* 4.0 - 10.5 K/uL Final  . RBC 11/10/2017 3.98* 4.22 - 5.81 MIL/uL Final  . Hemoglobin 11/10/2017 11.6* 13.0 - 17.0 g/dL Final  . HCT 11/10/2017 36.5* 39.0 - 52.0 % Final  . MCV 11/10/2017 91.7  78.0 - 100.0 fL Final  . MCH 11/10/2017 29.1  26.0 - 34.0 pg Final  . MCHC 11/10/2017 31.8  30.0 - 36.0 g/dL Final  . RDW 11/10/2017 13.4  11.5 - 15.5 % Final  . Platelets 11/10/2017 401* 150 - 400 K/uL Final  . Neutrophils Relative % 11/10/2017 65  % Final  . Neutro Abs 11/10/2017 7.1  1.7 - 7.7 K/uL Final  . Lymphocytes Relative 11/10/2017 28  % Final  . Lymphs Abs 11/10/2017 3.0  0.7 - 4.0 K/uL Final  . Monocytes Relative 11/10/2017 6  % Final  . Monocytes Absolute 11/10/2017 0.7  0.1 - 1.0 K/uL Final  . Eosinophils Relative 11/10/2017 1  % Final  . Eosinophils Absolute 11/10/2017 0.1  0.0 - 0.7 K/uL Final  . Basophils Relative 11/10/2017 0  % Final  . Basophils Absolute 11/10/2017 0.0  0.0  - 0.1 K/uL Final   Performed at [facility], [address], [place], [place] [address]  . Sodium 11/10/2017 139  135 - 145 mmol/L Final  . Potassium 11/10/2017 3.9  3.5 - 5.1 mmol/L Final  . Chloride 11/10/2017 101  101 - 111 mmol/L Final  . CO2 11/10/2017 25  22 - 32 mmol/L Final  . Glucose, Bld 11/10/2017 157* 65 - 99 mg/dL Final  . BUN 11/10/2017 11  6 - 20 mg/dL Final  . Creatinine, Ser 11/10/2017 1.03  0.61 - 1.24 mg/dL Final  . Calcium 11/10/2017 9.7  8.9 - 10.3 mg/dL Final  . Total Protein 11/10/2017 7.3  6.5 - 8.1 g/dL Final  . Albumin 11/10/2017 4.1  3.5 - 5.0 g/dL Final  . AST 11/10/2017 21  15 - 41 U/L Final  . ALT 11/10/2017 15* 17 - 63 U/L Final  . Alkaline Phosphatase 11/10/2017 35* 38 - 126 U/L Final  . Total Bilirubin 11/10/2017 0.2* 0.3 - 1.2 mg/dL Final  . GFR calc non Af Amer 11/10/2017 >60  >60 mL/min Final  . GFR calc Af Amer 11/10/2017 >60  >60 mL/min Final   Comment: (NOTE) The eGFR has been calculated using the CKD EPI equation. This calculation has not been validated in all clinical situations. eGFR's persistently <60 mL/min signify possible Chronic Kidney Disease.   Georgiann Hahn gap 11/10/2017 13  5 - 15 Final   Performed at [facility], [address], [place], [place] [address]  . Iron 11/10/2017 56  45 - 182 ug/dL Final  . TIBC 11/10/2017 496* 250 - 450 ug/dL Final  . Saturation Ratios 11/10/2017 11* 17.9 - 39.5 % Final  . UIBC 11/10/2017 440  ug/dL Final   Performed at [facility], [place] [address] [place], [place] [address]  . Ferritin 11/10/2017 106  24 - 336 ng/mL Final   Performed at [facility], [place] [address], [place], [place] [address]     Pathology Orders Placed This Encounter  Procedures  . CBC with Differential/Platelet    Standing Status:   Future    Standing Expiration Date:   11/11/2018  . Comprehensive metabolic  panel    Standing Status:   Future    Standing Expiration Date:   11/11/2018  . Ferritin    Standing Status:    Future    Standing Expiration Date:   11/11/2018  . Lactate dehydrogenase    Standing Status:   Future    Standing Expiration Date:   11/11/2018       Zoila Shutter MD

## 2018-01-12 ENCOUNTER — Other Ambulatory Visit (HOSPITAL_COMMUNITY): Payer: Self-pay | Admitting: Family Medicine

## 2018-01-12 DIAGNOSIS — Z13820 Encounter for screening for osteoporosis: Secondary | ICD-10-CM

## 2018-01-24 ENCOUNTER — Encounter: Payer: Self-pay | Admitting: Internal Medicine

## 2018-01-31 ENCOUNTER — Ambulatory Visit (HOSPITAL_COMMUNITY)
Admission: RE | Admit: 2018-01-31 | Discharge: 2018-01-31 | Disposition: A | Discharge status: Home / Self Care | Payer: 59 | Source: Ambulatory Visit | Attending: Family Medicine | Admitting: Family Medicine

## 2018-01-31 DIAGNOSIS — Z79899 Other long term (current) drug therapy: Secondary | ICD-10-CM | POA: Insufficient documentation

## 2018-01-31 DIAGNOSIS — Z13820 Encounter for screening for osteoporosis: Secondary | ICD-10-CM

## 2018-02-10 ENCOUNTER — Emergency Department (HOSPITAL_COMMUNITY): Payer: 59

## 2018-02-10 ENCOUNTER — Encounter (HOSPITAL_COMMUNITY): Payer: Self-pay | Admitting: Emergency Medicine

## 2018-02-10 ENCOUNTER — Other Ambulatory Visit: Payer: Self-pay

## 2018-02-10 ENCOUNTER — Emergency Department (HOSPITAL_COMMUNITY)
Admission: EM | Admit: 2018-02-10 | Discharge: 2018-02-10 | Disposition: A | Discharge status: Home / Self Care | Payer: 59 | Attending: Emergency Medicine | Admitting: Emergency Medicine

## 2018-02-10 DIAGNOSIS — Y9389 Activity, other specified: Secondary | ICD-10-CM | POA: Insufficient documentation

## 2018-02-10 DIAGNOSIS — W010XXA Fall on same level from slipping, tripping and stumbling without subsequent striking against object, initial encounter: Secondary | ICD-10-CM | POA: Insufficient documentation

## 2018-02-10 DIAGNOSIS — Z794 Long term (current) use of insulin: Secondary | ICD-10-CM | POA: Insufficient documentation

## 2018-02-10 DIAGNOSIS — Z79899 Other long term (current) drug therapy: Secondary | ICD-10-CM | POA: Insufficient documentation

## 2018-02-10 DIAGNOSIS — Y999 Unspecified external cause status: Secondary | ICD-10-CM | POA: Insufficient documentation

## 2018-02-10 DIAGNOSIS — Y929 Unspecified place or not applicable: Secondary | ICD-10-CM | POA: Insufficient documentation

## 2018-02-10 DIAGNOSIS — I10 Essential (primary) hypertension: Secondary | ICD-10-CM | POA: Diagnosis not present

## 2018-02-10 DIAGNOSIS — S3992XA Unspecified injury of lower back, initial encounter: Secondary | ICD-10-CM | POA: Diagnosis present

## 2018-02-10 DIAGNOSIS — Z760 Encounter for issue of repeat prescription: Secondary | ICD-10-CM

## 2018-02-10 DIAGNOSIS — E119 Type 2 diabetes mellitus without complications: Secondary | ICD-10-CM | POA: Diagnosis not present

## 2018-02-10 DIAGNOSIS — S39012A Strain of muscle, fascia and tendon of lower back, initial encounter: Secondary | ICD-10-CM | POA: Insufficient documentation

## 2018-02-10 HISTORY — DX: Polyneuropathy, unspecified: G62.9

## 2018-02-10 MED ORDER — GABAPENTIN 600 MG PO TABS: 600.0000 mg | ORAL_TABLET | Freq: Two times a day (BID) | ORAL | 0 refills | Status: DC

## 2018-02-10 MED ORDER — HYDROCODONE-ACETAMINOPHEN 5-325 MG PO TABS
1.0000 | ORAL_TABLET | Freq: Once | ORAL | Status: AC
  Administered 2018-02-10: 1 via ORAL
  Filled 2018-02-10: qty 1

## 2018-02-10 MED ORDER — METHOCARBAMOL 500 MG PO TABS
500.0000 mg | ORAL_TABLET | Freq: Once | ORAL | Status: AC
  Administered 2018-02-10: 500 mg via ORAL
  Filled 2018-02-10: qty 1

## 2018-02-10 MED ORDER — TRAMADOL HCL 50 MG PO TABS: 50.0000 mg | ORAL_TABLET | Freq: Four times a day (QID) | ORAL | 0 refills | Status: DC | PRN

## 2018-02-10 NOTE — ED Notes (Signed)
In garage yesterday when something flew at him Made him lose his balance and fall backwards  Now with back , bottom, and bilateral leg pain  Took aleve (last dose this am), without relief

## 2018-02-10 NOTE — ED Triage Notes (Signed)
Patient states he was swatting at a bee yesterday and fell landing on his buttocks. Complaining of lower back pain radiating down bilateral legs. Patient ambulatory with cane at triage.

## 2018-02-10 NOTE — Discharge Instructions (Addendum)
Alternate ice and heat to your lower back.  Follow-up with your primary doctor for recheck in 2 to 3 days.

## 2018-02-11 NOTE — ED Provider Notes (Signed)
[facility] EMERGENCY DEPARTMENT Provider Note   CSN: [id number] Arrival date & time: 02/10/18  1140     History   Chief Complaint Chief Complaint  Patient presents with  . Fall    HPI Brett [name] is a 67 y.o. male.  HPI  Brett [name] is a 67 y.o. male who presents to the Emergency Department complaining of low back and buttocks pain for 1 day.  States that he was swatting had a be when he fell backwards, landing on his buttocks.e complains of pain to his lower back that radiates into his buttocks and down both thighs.  Pain is worse with weightbearing.  He also states that he is out of his gabapentin 2 to 3 days.  He denies numbness or weakness of the lower extremities, abdominal pain, fever, chills, urine or bowel changes, head injury, neck pain or LOC.  Patient ambulates with a cane at baseline.  Past Medical History:  Diagnosis Date  . Depression   . Dextrocardia   . Diabetes mellitus   . H. pylori infection 11/09/2012   treated with pylera  . HTN (hypertension)    Cholesterol  . Iron deficiency anemia, unspecified 10/18/2012  . Neuropathy     Patient Active Problem List   Diagnosis Date Noted  . 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 Restrictive lung disease 11/08/2016  . 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 Moderate persistent asthma, uncomplicated 32/99/2426  . Thrombocytosis ([place]) 10/09/2014  . Abdominal pain 09/23/2014  . Gastritis, Helicobacter pylori 83/41/9622  . Insulin-requiring or dependent type II diabetes mellitus ([place]) 01/03/2014  . 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 GERD (gastroesophageal reflux disease) 10/18/2012  . Nausea 10/18/2012  . Iron deficiency anemia due to chronic blood loss 10/18/2012  . HIP PAIN 08/31/2010  . SPINAL STENOSIS 06/28/2010  . TENDINITIS, CALCIFIC, SHOULDER, RIGHT 06/14/2010  . IMPINGEMENT SYNDROME 06/14/2010    Past Surgical History:  Procedure Laterality Date  . CATARACT EXTRACTION, BILATERAL  2016  .  COLONOSCOPY WITH ESOPHAGOGASTRODUODENOSCOPY (EGD) N/A 11/09/2012   Dr. 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 hpylori on bx- treated with pylera. TCS- normal rectum. 1 diminutive polyp in the mid descending segment. 1-45mm polyp in the mid desending segment o/w the remainder of the colonic mucosa appeared normal. tubular adenoma on bx  . GIVENS CAPSULE STUDY N/A 10/07/2013   no source for anemia or heme positive stool noted  . NECK SURGERY     bone spurs        Home Medications    Prior to Admission medications   Medication Sig Start Date End Date Taking? Authorizing Provider  albuterol (PROAIR HFA) 108 (90 Base) MCG/ACT inhaler Inhale 2 puffs into the lungs every 4 (four) hours as needed for wheezing or shortness of breath. 09/27/16  Yes Valentina Shaggy, MD  azelastine (ASTELIN) 0.1 % nasal spray Place 2 sprays into both nostrils 2 (two) times daily. Use in each nostril as directed 09/27/16  Yes Valentina Shaggy, MD  budesonide-formoterol [facility]) 160-4.5 MCG/ACT inhaler Inhale 2 puffs into the lungs 2 (two) times daily. 09/27/16  Yes Valentina Shaggy, MD  DEXILANT 60 MG capsule TAKE (1) CAPSULE BY MOUTH EVERY DAY. 08/26/15  Yes [name], PA-C  DULoxetine (CYMBALTA) 60 MG capsule Take 60 mg by mouth daily.   Yes [provider]  fenofibrate (TRICOR) 145 MG tablet Take 145 mg by mouth daily.   Yes [provider]  folic acid (FOLVITE) 1 MG tablet Take 1 mg by mouth daily.   Yes [provider]  gabapentin (NEURONTIN) 600 MG tablet Take 600 mg by mouth 2 (two) times daily.   Yes [provider]  insulin detemir (LEVEMIR) 100 UNIT/ML injection Inject 30-60 Units into the skin 2 (two) times daily. 30 units in the am and 60 units at qhs   Yes [provider]  metFORMIN (GLUCOPHAGE) 1000 MG tablet Take 1,000 mg by mouth 2 (two) times daily with  a meal.   Yes [provider]  Multiple Vitamin (MULTIVITAMIN) tablet Take 1 tablet by mouth daily.   Yes [provider]  pioglitazone (ACTOS) 45 MG tablet Take 45 mg by mouth daily.   Yes [provider]  promethazine (PHENERGAN) 12.5 MG tablet Take 12.5 mg by mouth every 6 (six) hours as needed for nausea.  06/27/16  Yes [provider]  rosuvastatin (CRESTOR) 20 MG tablet Take 20 mg by mouth at bedtime.    Yes [provider]  sitaGLIPtin (JANUVIA) 100 MG tablet Take 100 mg by mouth daily.    Yes [provider]  traMADol (ULTRAM) 50 MG tablet Take 50-100 mg by mouth every 6 (six) hours as needed. For pain   Yes [provider]  gabapentin (NEURONTIN) 600 MG tablet Take 1 tablet (600 mg total) by mouth 2 (two) times daily. 02/10/18   Thora Scherman, PA-C  traMADol (ULTRAM) 50 MG tablet Take 1 tablet (50 mg total) by mouth every 6 (six) hours as needed. 02/10/18   Kem Parkinson, PA-C    Family History Family History  Problem Relation Age of Onset  . Diabetes Sister        Fx  . Heart defect Sister        Fx  . Arthritis Sister        Fx  . Asthma Sister        Fx  . Kidney disease Sister        Fx  . Pancreatitis Sister     Social History Social History   Tobacco Use  . Smoking status: Never Smoker  . Smokeless tobacco: Current User    Types: Chew  Substance Use Topics  . Alcohol use: No  . Drug use: No     Allergies   Patient has no known allergies.   Review of Systems Review of Systems  Constitutional: Negative for fever.  Respiratory: Negative for shortness of breath.   Gastrointestinal: Negative for abdominal pain, constipation and vomiting.  Genitourinary: Negative for decreased urine volume, difficulty urinating, dysuria, flank pain and hematuria.  Musculoskeletal: Positive for back pain. Negative for joint swelling.  Skin: Negative for rash.  Neurological: Negative for weakness and numbness.    All other systems reviewed and are negative.    Physical Exam Updated Vital Signs BP (!) 157/72 (BP Location: Left Arm)   Pulse 88   Temp 98.4 F (36.9 C) (Oral)   Resp 20   Ht 5\' 7"  (1.702 m)   Wt 113.4 kg (250 lb)   SpO2 97%   BMI 39.16 kg/m   Physical Exam  Constitutional: He is oriented to person, place, and time. He appears well-developed and well-nourished. No distress.  HENT:  Head: Normocephalic and atraumatic.  Neck: Normal range of motion. Neck supple.  Cardiovascular: Normal rate, regular rhythm and intact distal pulses.  DP pulses are strong and palpable bilaterally  Pulmonary/Chest: Effort normal and breath sounds normal.  No respiratory distress.  Abdominal: Soft. He exhibits no distension. There is no tenderness.  Musculoskeletal: He exhibits tenderness. He exhibits no edema.       Lumbar back: He exhibits tenderness and pain. He exhibits normal range of motion, no swelling, no deformity, no laceration and normal pulse.  ttp of the bilateral lumbar paraspinal muscles.  No spinal tenderness, or step-off deformity. pt has 5/5 strength against resistance of bilateral lower extremities.  Negative straight leg raise bilaterally   Neurological: He is alert and oriented to person, place, and time. He has normal strength. No sensory deficit. He exhibits normal muscle tone. Coordination and gait normal.  Reflex Scores:      Patellar reflexes are 2+ on the right side and 2+ on the left side.      Achilles reflexes are 2+ on the right side and 2+ on the left side. Skin: Skin is warm and dry. Capillary refill takes less than 2 seconds. No rash noted.  Psychiatric: He has a normal mood and affect.  Nursing note and vitals reviewed.    ED Treatments / Results  Labs (all labs ordered are listed, but only abnormal results are displayed) Labs Reviewed - No data to display  EKG None  Radiology Dg Lumbar Spine Complete  Result Date: 02/10/2018 CLINICAL DATA:   67 year old who fell and complains of acute low back pain. Initial encounter. EXAM: LUMBAR SPINE - COMPLETE 4+ VIEW COMPARISON:  Bone window images from CT abdomen pelvis 04/19/2014. FINDINGS: Five non-rib-bearing lumbar vertebrae with anatomic POSTERIOR alignment. Straightening of the usual lumbar lordosis. Slight lumbar dextroscoliosis. No fractures. Well-preserved disc spaces. ANTERIOR osteophytes at L2-3, L3-4 and L4-5. Calcification within the T11-12 and T12-L1 intervertebral discs. No pars defects or significant facet arthropathy. Sacroiliac joints intact. IMPRESSION: 1. No acute osseous abnormality. 2. Mild spondylosis involving the LOWER thoracic and lumbar spine. Electronically Signed   By: Evangeline Dakin [name]   On: 02/10/2018 12:33    Procedures Procedures (including critical care time)  Medications Ordered in ED Medications  HYDROcodone-acetaminophen (NORCO/VICODIN) 5-325 MG per tablet 1 tablet (1 tablet Oral Given 02/10/18 1348)  methocarbamol (ROBAXIN) tablet 500 mg (500 mg Oral Given 02/10/18 1348)     Initial Impression / Assessment and Plan / ED Course  I have reviewed the triage vital signs and the nursing notes.  Pertinent labs & imaging results that were available during my care of the patient were reviewed by me and considered in my medical decision making (see chart for details).     Neurovascularly intact.  Patient is ambulatory in the department with steady gait, uses cane at baseline.  X-ray reassuring.  No focal neuro deficits, no concerning symptoms for cauda equina or infectious process.  Patient agrees to treatment plan, and close PCP follow-up   Final Clinical Impressions(s) / ED Diagnoses   Final diagnoses:  Lumbar strain, initial encounter  Medication refill    ED Discharge Orders        Ordered    gabapentin (NEURONTIN) 600 MG tablet  2 times daily     02/10/18 1341    traMADol (ULTRAM) 50 MG tablet  Every 6 hours PRN     02/10/18 1341        Jerl Munyan, [place], PA-C 02/11/18 2058    Francine Graven, DO 02/14/18 1235

## 2018-02-13 ENCOUNTER — Ambulatory Visit: Payer: 59

## 2018-02-15 ENCOUNTER — Ambulatory Visit: Payer: 59

## 2018-02-22 ENCOUNTER — Ambulatory Visit: Payer: 59

## 2018-03-22 ENCOUNTER — Ambulatory Visit (INDEPENDENT_AMBULATORY_CARE_PROVIDER_SITE_OTHER): Payer: 59 | Admitting: Otolaryngology

## 2018-04-10 ENCOUNTER — Inpatient Hospital Stay (HOSPITAL_COMMUNITY): Payer: 59

## 2018-04-10 ENCOUNTER — Encounter (HOSPITAL_COMMUNITY): Payer: Self-pay | Admitting: Emergency Medicine

## 2018-04-10 ENCOUNTER — Inpatient Hospital Stay (HOSPITAL_COMMUNITY)
Admission: EM | Admit: 2018-04-10 | Discharge: 2018-04-15 | DRG: 291 | Disposition: A | Discharge status: Home / Self Care | Payer: 59 | Attending: Internal Medicine | Admitting: Internal Medicine

## 2018-04-10 ENCOUNTER — Other Ambulatory Visit: Payer: Self-pay

## 2018-04-10 ENCOUNTER — Emergency Department (HOSPITAL_COMMUNITY): Payer: 59

## 2018-04-10 DIAGNOSIS — J470 Bronchiectasis with acute lower respiratory infection: Secondary | ICD-10-CM | POA: Diagnosis present

## 2018-04-10 DIAGNOSIS — Z23 Encounter for immunization: Secondary | ICD-10-CM | POA: Diagnosis not present

## 2018-04-10 DIAGNOSIS — I5033 Acute on chronic diastolic (congestive) heart failure: Secondary | ICD-10-CM | POA: Diagnosis present

## 2018-04-10 DIAGNOSIS — Z9841 Cataract extraction status, right eye: Secondary | ICD-10-CM

## 2018-04-10 DIAGNOSIS — Z825 Family history of asthma and other chronic lower respiratory diseases: Secondary | ICD-10-CM

## 2018-04-10 DIAGNOSIS — E119 Type 2 diabetes mellitus without complications: Secondary | ICD-10-CM

## 2018-04-10 DIAGNOSIS — J810 Acute pulmonary edema: Secondary | ICD-10-CM | POA: Diagnosis present

## 2018-04-10 DIAGNOSIS — Z794 Long term (current) use of insulin: Secondary | ICD-10-CM | POA: Diagnosis not present

## 2018-04-10 DIAGNOSIS — J9601 Acute respiratory failure with hypoxia: Secondary | ICD-10-CM | POA: Diagnosis present

## 2018-04-10 DIAGNOSIS — I259 Chronic ischemic heart disease, unspecified: Secondary | ICD-10-CM | POA: Diagnosis present

## 2018-04-10 DIAGNOSIS — I471 Supraventricular tachycardia, unspecified: Secondary | ICD-10-CM | POA: Diagnosis present

## 2018-04-10 DIAGNOSIS — I5031 Acute diastolic (congestive) heart failure: Secondary | ICD-10-CM | POA: Diagnosis not present

## 2018-04-10 DIAGNOSIS — J984 Other disorders of lung: Secondary | ICD-10-CM

## 2018-04-10 DIAGNOSIS — I110 Hypertensive heart disease with heart failure: Principal | ICD-10-CM | POA: Diagnosis present

## 2018-04-10 DIAGNOSIS — I5032 Chronic diastolic (congestive) heart failure: Secondary | ICD-10-CM

## 2018-04-10 DIAGNOSIS — I248 Other forms of acute ischemic heart disease: Secondary | ICD-10-CM | POA: Diagnosis present

## 2018-04-10 DIAGNOSIS — Q893 Situs inversus: Secondary | ICD-10-CM

## 2018-04-10 DIAGNOSIS — J45909 Unspecified asthma, uncomplicated: Secondary | ICD-10-CM | POA: Diagnosis present

## 2018-04-10 DIAGNOSIS — D500 Iron deficiency anemia secondary to blood loss (chronic): Secondary | ICD-10-CM | POA: Diagnosis present

## 2018-04-10 DIAGNOSIS — J189 Pneumonia, unspecified organism: Secondary | ICD-10-CM | POA: Diagnosis present

## 2018-04-10 DIAGNOSIS — D473 Essential (hemorrhagic) thrombocythemia: Secondary | ICD-10-CM | POA: Diagnosis present

## 2018-04-10 DIAGNOSIS — E669 Obesity, unspecified: Secondary | ICD-10-CM | POA: Diagnosis present

## 2018-04-10 DIAGNOSIS — K219 Gastro-esophageal reflux disease without esophagitis: Secondary | ICD-10-CM | POA: Diagnosis present

## 2018-04-10 DIAGNOSIS — R0602 Shortness of breath: Secondary | ICD-10-CM

## 2018-04-10 DIAGNOSIS — Z79899 Other long term (current) drug therapy: Secondary | ICD-10-CM

## 2018-04-10 DIAGNOSIS — Z833 Family history of diabetes mellitus: Secondary | ICD-10-CM

## 2018-04-10 DIAGNOSIS — I509 Heart failure, unspecified: Secondary | ICD-10-CM

## 2018-04-10 DIAGNOSIS — E1142 Type 2 diabetes mellitus with diabetic polyneuropathy: Secondary | ICD-10-CM | POA: Diagnosis present

## 2018-04-10 DIAGNOSIS — Z9842 Cataract extraction status, left eye: Secondary | ICD-10-CM

## 2018-04-10 DIAGNOSIS — F329 Major depressive disorder, single episode, unspecified: Secondary | ICD-10-CM | POA: Diagnosis present

## 2018-04-10 DIAGNOSIS — Z8711 Personal history of peptic ulcer disease: Secondary | ICD-10-CM

## 2018-04-10 DIAGNOSIS — F17220 Nicotine dependence, chewing tobacco, uncomplicated: Secondary | ICD-10-CM | POA: Diagnosis present

## 2018-04-10 DIAGNOSIS — J329 Chronic sinusitis, unspecified: Secondary | ICD-10-CM | POA: Diagnosis present

## 2018-04-10 DIAGNOSIS — Z6839 Body mass index (BMI) 39.0-39.9, adult: Secondary | ICD-10-CM | POA: Diagnosis not present

## 2018-04-10 LAB — BASIC METABOLIC PANEL
Anion gap: 13 (ref 5–15)
BUN: 14 mg/dL (ref 8–23)
CALCIUM: 8.6 mg/dL — AB (ref 8.9–10.3)
CHLORIDE: 99 mmol/L (ref 98–111)
CO2: 26 mmol/L (ref 22–32)
CREATININE: 1.02 mg/dL (ref 0.61–1.24)
Glucose, Bld: 150 mg/dL — ABNORMAL HIGH (ref 70–99)
POTASSIUM: 3.8 mmol/L (ref 3.5–5.1)
SODIUM: 138 mmol/L (ref 135–145)

## 2018-04-10 LAB — TROPONIN I
TROPONIN I: 0.3 ng/mL — AB (ref <0.03)
TROPONIN I: 0.43 ng/mL — AB (ref <0.03)
Troponin I: 0.42 ng/mL (ref <0.03)

## 2018-04-10 LAB — CBC WITH DIFFERENTIAL/PLATELET
Basophils Absolute: 0 10*3/uL (ref 0.0–0.1)
Basophils Relative: 0 %
EOS ABS: 0 10*3/uL (ref 0.0–0.7)
EOS PCT: 0 %
HCT: 26.1 % — ABNORMAL LOW (ref 39.0–52.0)
HEMOGLOBIN: 8.2 g/dL — AB (ref 13.0–17.0)
LYMPHS ABS: 1.8 10*3/uL (ref 0.7–4.0)
Lymphocytes Relative: 15 %
MCH: 28.6 pg (ref 26.0–34.0)
MCHC: 31.4 g/dL (ref 30.0–36.0)
MCV: 90.9 fL (ref 78.0–100.0)
Monocytes Absolute: 0.9 10*3/uL (ref 0.1–1.0)
Monocytes Relative: 7 %
NEUTROS PCT: 78 %
Neutro Abs: 9.6 10*3/uL — ABNORMAL HIGH (ref 1.7–7.7)
PLATELETS: 478 10*3/uL — AB (ref 150–400)
RBC: 2.87 MIL/uL — AB (ref 4.22–5.81)
RDW: 13.3 % (ref 11.5–15.5)
WBC: 12.3 10*3/uL — ABNORMAL HIGH (ref 4.0–10.5)

## 2018-04-10 LAB — GLUCOSE, CAPILLARY: GLUCOSE-CAPILLARY: 302 mg/dL — AB (ref 70–99)

## 2018-04-10 LAB — FERRITIN: Ferritin: 160 ng/mL (ref 24–336)

## 2018-04-10 LAB — HEPATIC FUNCTION PANEL
ALT: 23 U/L (ref 0–44)
AST: 23 U/L (ref 15–41)
Albumin: 3.1 g/dL — ABNORMAL LOW (ref 3.5–5.0)
Alkaline Phosphatase: 85 U/L (ref 38–126)
BILIRUBIN DIRECT: 0.1 mg/dL (ref 0.0–0.2)
BILIRUBIN INDIRECT: 0.3 mg/dL (ref 0.3–0.9)
TOTAL PROTEIN: 6.5 g/dL (ref 6.5–8.1)
Total Bilirubin: 0.4 mg/dL (ref 0.3–1.2)

## 2018-04-10 LAB — RETICULOCYTES
RBC.: 2.89 MIL/uL — ABNORMAL LOW (ref 4.22–5.81)
Retic Count, Absolute: 49.1 10*3/uL (ref 19.0–186.0)
Retic Ct Pct: 1.7 % (ref 0.4–3.1)

## 2018-04-10 LAB — BRAIN NATRIURETIC PEPTIDE: B NATRIURETIC PEPTIDE 5: 231 pg/mL — AB (ref 0.0–100.0)

## 2018-04-10 LAB — CBG MONITORING, ED: GLUCOSE-CAPILLARY: 131 mg/dL — AB (ref 70–99)

## 2018-04-10 LAB — TYPE AND SCREEN
ABO/RH(D): O POS
Antibody Screen: NEGATIVE

## 2018-04-10 LAB — LACTIC ACID, PLASMA: LACTIC ACID, VENOUS: 1 mmol/L (ref 0.5–1.9)

## 2018-04-10 LAB — IRON AND TIBC
IRON: 12 ug/dL — AB (ref 45–182)
SATURATION RATIOS: 3 % — AB (ref 17.9–39.5)
TIBC: 412 ug/dL (ref 250–450)
UIBC: 400 ug/dL

## 2018-04-10 LAB — VITAMIN B12: VITAMIN B 12: 649 pg/mL (ref 180–914)

## 2018-04-10 LAB — POC OCCULT BLOOD, ED: Fecal Occult Bld: NEGATIVE

## 2018-04-10 MED ORDER — PERFLUTREN LIPID MICROSPHERE
1.0000 mL | INTRAVENOUS | Status: AC | PRN
Start: 2018-04-10 — End: 2018-04-10
  Administered 2018-04-10: 2 mL via INTRAVENOUS
  Filled 2018-04-10: qty 10

## 2018-04-10 MED ORDER — FOLIC ACID 1 MG PO TABS
1.0000 mg | ORAL_TABLET | Freq: Every day | ORAL | Status: DC
  Administered 2018-04-11 – 2018-04-15 (×5): 1 mg via ORAL
  Filled 2018-04-10 (×6): qty 1

## 2018-04-10 MED ORDER — PROMETHAZINE HCL 12.5 MG PO TABS
12.5000 mg | ORAL_TABLET | Freq: Four times a day (QID) | ORAL | Status: DC | PRN
Start: 2018-04-10 — End: 2018-04-15

## 2018-04-10 MED ORDER — ALBUTEROL SULFATE (2.5 MG/3ML) 0.083% IN NEBU: 3.0000 mL | INHALATION_SOLUTION | RESPIRATORY_TRACT | Status: DC | PRN

## 2018-04-10 MED ORDER — ONE-DAILY MULTI VITAMINS PO TABS: 1.0000 | ORAL_TABLET | Freq: Every day | ORAL | Status: DC

## 2018-04-10 MED ORDER — ADULT MULTIVITAMIN W/MINERALS CH
1.0000 | ORAL_TABLET | Freq: Every day | ORAL | Status: DC
  Administered 2018-04-11 – 2018-04-15 (×5): 1 via ORAL
  Filled 2018-04-10 (×6): qty 1

## 2018-04-10 MED ORDER — DULOXETINE HCL 60 MG PO CPEP
60.0000 mg | ORAL_CAPSULE | Freq: Every day | ORAL | Status: DC
  Administered 2018-04-11 – 2018-04-15 (×5): 60 mg via ORAL
  Filled 2018-04-10 (×6): qty 1

## 2018-04-10 MED ORDER — FUROSEMIDE 10 MG/ML IJ SOLN
40.0000 mg | Freq: Once | INTRAMUSCULAR | Status: AC
  Administered 2018-04-10: 40 mg via INTRAVENOUS

## 2018-04-10 MED ORDER — ROSUVASTATIN CALCIUM 20 MG PO TABS
40.0000 mg | ORAL_TABLET | Freq: Every evening | ORAL | Status: DC
  Administered 2018-04-10 – 2018-04-14 (×5): 40 mg via ORAL
  Filled 2018-04-10 (×5): qty 2

## 2018-04-10 MED ORDER — HYDROCHLOROTHIAZIDE 12.5 MG PO CAPS
12.5000 mg | ORAL_CAPSULE | Freq: Every day | ORAL | Status: DC
  Administered 2018-04-11 – 2018-04-13 (×3): 12.5 mg via ORAL
  Filled 2018-04-10 (×4): qty 1

## 2018-04-10 MED ORDER — SODIUM CHLORIDE 0.9% FLUSH
3.0000 mL | Freq: Two times a day (BID) | INTRAVENOUS | Status: DC
  Administered 2018-04-10 – 2018-04-15 (×10): 3 mL via INTRAVENOUS

## 2018-04-10 MED ORDER — ALBUTEROL SULFATE (2.5 MG/3ML) 0.083% IN NEBU
5.0000 mg | INHALATION_SOLUTION | Freq: Once | RESPIRATORY_TRACT | Status: AC
  Administered 2018-04-10: 5 mg via RESPIRATORY_TRACT
  Filled 2018-04-10: qty 6

## 2018-04-10 MED ORDER — ACETAMINOPHEN 325 MG PO TABS
650.0000 mg | ORAL_TABLET | ORAL | Status: DC | PRN
  Filled 2018-04-10 (×2): qty 2

## 2018-04-10 MED ORDER — SODIUM CHLORIDE 0.9% FLUSH: 3.0000 mL | INTRAVENOUS | Status: DC | PRN

## 2018-04-10 MED ORDER — PNEUMOCOCCAL VAC POLYVALENT 25 MCG/0.5ML IJ INJ
0.5000 mL | INJECTION | INTRAMUSCULAR | Status: AC
  Administered 2018-04-11: 0.5 mL via INTRAMUSCULAR
  Filled 2018-04-10: qty 0.5

## 2018-04-10 MED ORDER — ONDANSETRON HCL 4 MG/2ML IJ SOLN
4.0000 mg | Freq: Four times a day (QID) | INTRAMUSCULAR | Status: DC | PRN
  Administered 2018-04-11 – 2018-04-12 (×2): 4 mg via INTRAVENOUS
  Filled 2018-04-10 (×2): qty 2

## 2018-04-10 MED ORDER — AZELASTINE HCL 0.1 % NA SOLN
2.0000 | Freq: Two times a day (BID) | NASAL | Status: DC
  Administered 2018-04-10 – 2018-04-15 (×10): 2 via NASAL
  Filled 2018-04-10 (×2): qty 30

## 2018-04-10 MED ORDER — SODIUM CHLORIDE 0.9 % IV SOLN
250.0000 mL | INTRAVENOUS | Status: DC | PRN
  Administered 2018-04-11: 250 mL via INTRAVENOUS

## 2018-04-10 MED ORDER — ASPIRIN EC 81 MG PO TBEC
81.0000 mg | DELAYED_RELEASE_TABLET | Freq: Every day | ORAL | Status: DC
  Administered 2018-04-10 – 2018-04-15 (×6): 81 mg via ORAL
  Filled 2018-04-10 (×7): qty 1

## 2018-04-10 MED ORDER — INSULIN DETEMIR 100 UNIT/ML ~~LOC~~ SOLN
30.0000 [IU] | Freq: Two times a day (BID) | SUBCUTANEOUS | Status: DC
  Administered 2018-04-10 – 2018-04-15 (×10): 30 [IU] via SUBCUTANEOUS
  Filled 2018-04-10 (×12): qty 0.3

## 2018-04-10 MED ORDER — FUROSEMIDE 10 MG/ML IJ SOLN
40.0000 mg | Freq: Two times a day (BID) | INTRAMUSCULAR | Status: DC
  Administered 2018-04-11 – 2018-04-12 (×3): 40 mg via INTRAVENOUS
  Filled 2018-04-10 (×4): qty 4

## 2018-04-10 MED ORDER — INSULIN ASPART 100 UNIT/ML ~~LOC~~ SOLN
0.0000 [IU] | Freq: Three times a day (TID) | SUBCUTANEOUS | Status: DC
  Administered 2018-04-11: 2 [IU] via SUBCUTANEOUS
  Administered 2018-04-11: 5 [IU] via SUBCUTANEOUS
  Administered 2018-04-12: 7 [IU] via SUBCUTANEOUS
  Administered 2018-04-12: 3 [IU] via SUBCUTANEOUS
  Administered 2018-04-12 – 2018-04-13 (×3): 2 [IU] via SUBCUTANEOUS
  Administered 2018-04-13: 5 [IU] via SUBCUTANEOUS
  Administered 2018-04-14: 2 [IU] via SUBCUTANEOUS
  Administered 2018-04-15: 1 [IU] via SUBCUTANEOUS

## 2018-04-10 MED ORDER — INSULIN DETEMIR 100 UNIT/ML ~~LOC~~ SOLN: 30.0000 [IU] | Freq: Two times a day (BID) | SUBCUTANEOUS | Status: DC

## 2018-04-10 MED ORDER — LOSARTAN POTASSIUM 50 MG PO TABS
100.0000 mg | ORAL_TABLET | Freq: Every day | ORAL | Status: DC
  Administered 2018-04-11 – 2018-04-14 (×4): 100 mg via ORAL
  Filled 2018-04-10 (×5): qty 2

## 2018-04-10 MED ORDER — ROPINIROLE HCL 1 MG PO TABS
1.0000 mg | ORAL_TABLET | Freq: Every day | ORAL | Status: DC
  Administered 2018-04-10 – 2018-04-14 (×5): 1 mg via ORAL
  Filled 2018-04-10 (×5): qty 1

## 2018-04-10 MED ORDER — FUROSEMIDE 10 MG/ML IJ SOLN
40.0000 mg | Freq: Once | INTRAMUSCULAR | Status: AC
  Administered 2018-04-10: 40 mg via INTRAVENOUS
  Filled 2018-04-10: qty 4

## 2018-04-10 NOTE — ED Notes (Signed)
Have paged respiratory for breathing treatment  

## 2018-04-10 NOTE — ED Notes (Signed)
Placed patient on 2L O2 via nasal cannula. Patient O2 increased to 95%.

## 2018-04-10 NOTE — Progress Notes (Signed)
*  PRELIMINARY RESULTS* Echocardiogram 2D Echocardiogram with definity has been performed.  Brett [name] 04/10/2018, 4:48 PM

## 2018-04-10 NOTE — ED Notes (Signed)
Pt. Given portable fan.

## 2018-04-10 NOTE — Consult Note (Signed)
Cardiology Consultation:   Patient ID: Brett [name]; [id number]; October 04, 1950   Admit date: 04/10/2018 Date of Consult: 04/10/2018  Primary Care Provider: [name], MD Primary Cardiologist: new, Regginald Pask Primary Electrophysiologist:  na   Patient Profile:   Brett [name] is a 68 y.o. 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 Roderic Palau  History of Present Illness:   Brett [name] 67 yo male history of iron deficiency anemia, asthma, DM2, GERD, Kartageners syndrome with situs inversus totalis admitted with SOB. He reports feeling like "he had a cold" over the weekend. Subject fevers/chills, sinus congestion. Increase in his chronic cough and productive cough. Symptoms improved somewhat over the weekend, however significantly worsened yesterday. Reports a central chest tightness that lasted hours, worst with breathing and coughing. Tightness has since resolved. He reports increased wheezing.    CXR increased bilateral lung opacities, ? Edema vs pneumonia WBC 12.3 Hgb 8.2 (down from 11.6) Plt 478 K 3.8 Cr 1 BNP 231 Lactic acid 1 FOBT negative  Trop 0.3--> EKG SR, RAD, no acute ischemic changes.  Past Medical History:  Diagnosis Date  . Depression   . Dextrocardia   . Diabetes mellitus   . H. pylori infection 11/09/2012   treated with pylera  . HTN (hypertension)    Cholesterol  . Iron deficiency anemia, unspecified 10/18/2012  . Neuropathy     Past Surgical History:  Procedure Laterality Date  . CATARACT EXTRACTION, BILATERAL  2016  . COLONOSCOPY WITH ESOPHAGOGASTRODUODENOSCOPY (EGD) N/A 11/09/2012   Dr. 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 hpylori on bx- treated with pylera. TCS- normal rectum. 1 diminutive polyp in the mid descending segment. 1-40mm polyp in the mid desending  segment o/w the remainder of the colonic mucosa appeared normal. tubular adenoma on bx  . GIVENS CAPSULE STUDY N/A 10/07/2013   no source for anemia or heme positive stool noted  . NECK SURGERY     bone spurs     Inpatient Medications: Scheduled Meds: . furosemide  40 mg Intravenous Once   Continuous Infusions:  PRN Meds:   Allergies:   No Known Allergies  Social History:   Social History   Socioeconomic History  . Marital status: Married    Spouse name: Not on file  . Number of children: 0  . Years of education: 7th grade   . Highest education level: Not on file  Occupational History  . Occupation: Lexicographer  . Financial resource strain: Not on file  . Food insecurity:    Worry: Not on file    Inability: Not on file  . Transportation needs:    Medical: Not on file    Non-medical: Not on file  Tobacco Use  . Smoking status: Never Smoker  . Smokeless tobacco: Current User    Types: Chew  Substance and Sexual Activity  . Alcohol use: No  . Drug use: No  . Sexual activity: Not on file  Lifestyle  . Physical activity:    Days per week: Not on file    Minutes per session: Not on file  . Stress: Not on file  Relationships  . Social connections:    Talks on phone: Not on file    Gets together: Not on file    Attends religious service: Not on file  Active member of club or organization: Not on file    Attends meetings of clubs or organizations: Not on file    Relationship status: Not on file  . Intimate partner violence:    Fear of current or ex partner: Not on file    Emotionally abused: Not on file    Physically abused: Not on file    Forced sexual activity: Not on file  Other Topics Concern  . Not on file  Social History Narrative  . Not on file    Family History:    Family History  Problem Relation Age of Onset  . Diabetes Sister        Fx  . Heart defect Sister        Fx  . Arthritis Sister        Fx  . Asthma Sister         Fx  . Kidney disease Sister        Fx  . Pancreatitis Sister      ROS:  Please see the history of present illness.  All other ROS reviewed and negative.     Physical Exam/Data:   Vitals:   04/10/18 1307 04/10/18 1330 04/10/18 1400 04/10/18 1430  BP: (!) 156/69 (!) 124/53 107/77 (!) 114/52  Pulse: 88 88 88 89  Resp: (!) 22 (!) 22 (!) 22 (!) 22  Temp:      TempSrc:      SpO2: 95% 100% 97% 94%  Weight:      Height:       No intake or output data in the 24 hours ending 04/10/18 1453 Filed Weights   04/10/18 1152  Weight: 260 lb (117.9 kg)   Body mass index is 38.4 kg/m.  General:  Well nourished, well developed, in no acute distress HEENT: normal Lymph: no adenopathy Neck: no JVD Endocrine:  No thryomegaly Cardiac:  normal S1, S2; RRR; no murmur  Lungs:  Bilateral wheezing.  Abd: soft, nontender, no hepatomegaly  Ext: no edema Musculoskeletal:  No deformities, BUE and BLE strength normal and equal Skin: warm and dry  Neuro:  CNs 2-12 intact, no focal abnormalities noted Psych:  Normal affect   Laboratory Data:  Chemistry Recent Labs  Lab 04/10/18 1308  NA 138  K 3.8  CL 99  CO2 26  GLUCOSE 150*  BUN 14  CREATININE 1.02  CALCIUM 8.6*  GFRNONAA >60  GFRAA >60  ANIONGAP 13    Recent Labs  Lab 04/10/18 1308  PROT 6.5  ALBUMIN 3.1*  AST 23  ALT 23  ALKPHOS 85  BILITOT 0.4   Hematology Recent Labs  Lab 04/10/18 1308  WBC 12.3*  RBC 2.87*  HGB 8.2*  HCT 26.1*  MCV 90.9  MCH 28.6  MCHC 31.4  RDW 13.3  PLT 478*   Cardiac Enzymes Recent Labs  Lab 04/10/18 1308  TROPONINI 0.30*   No results for input(s): TROPIPOC in the last 168 hours.  BNP Recent Labs  Lab 04/10/18 1308  BNP 231.0*    DDimer No results for input(s): DDIMER in the last 168 hours.  Radiology/Studies:  Dg Chest 2 View  Result Date: 04/10/2018 CLINICAL DATA:  Productive cough, shortness of breath. EXAM: CHEST - 2 VIEW COMPARISON:  Radiographs of October 14, 2016.  FINDINGS: The heart size and mediastinal contours are within normal limits. No pneumothorax is noted. Minimal bilateral pleural effusions are noted. Increased bilateral lung opacities are noted concerning for edema or possibly pneumonia. The  visualized skeletal structures are unremarkable. IMPRESSION: Increased bilateral lung opacities are noted concerning for edema or possibly pneumonia. Continued radiographic follow-up is recommended. Electronically Signed   By: Marijo Conception, [name]   On: 04/10/2018 12:21    Assessment and Plan:   1. SOB - history of restrictive lung disease in setting of Kartagener syndrome and bronchiectasis. Cold like symptoms over the weekend, reports increased wheezing, cough, congestion.  - CXR with edema vs pneumonia, mildly elevated BNP. Elevated WBC - mild trop elevation without acute ischemic changes in setting of hypoxia, at this point nonspecific findings. I think probable demand ischemia. Would not anticoagulate in this setting, particularly with his degree of anemia.  - at this point I think primary etiology is pulmonary, probable infection on top of his chronic restrictive lung disease and bronchiectasis. May have some fluid overload component. Agree with diuresis, we will check echo. Redose lasix 40mg  IV later today.  EKG RAD in setting of dextrocardia. Cycle EKG and enzymes.  -defer consideration for abx to primary team, given his CXR and hypoxia would have low threshold.   2. Anemia - down to 8.2 this admission from 11.6 5 months ago   For questions or updates, please contact [place] Please consult [email or web address] for contact info under Cardiology/STEMI.   Merrily Pew, MD  04/10/2018 2:53 PM

## 2018-04-10 NOTE — ED Notes (Signed)
Date and time results received: 04/10/18 1407 (use smartphrase ".now" to insert current time)  Test: Trop 0.30 Critical Value: Trop 0.30  Name of Provider Notified: Dr. Roderic Palau  Orders Received? Or Actions Taken?: no new orders at this time

## 2018-04-10 NOTE — H&P (Signed)
History and Physical    SANTHOSH GULINO ZOX:[id number] DOB: July 10, 1951 DOA: 04/10/2018  PCP: [name], MD  Patient coming from: Home  Chief Complaint: Shortness of breath  HPI: Brett [name] is a 67 y.o. male with medical history significant of shortness of breath for several days with no associated nausea vomiting diarrhea fevers.  Patient denies any swelling.  He does report he chronically has a problem lying flat and he has sleeping issues.  His wife reports he snores a lot.  He does not get sick a lot due to his Kartagener's syndrome.  Denies wheezing.  Patient reports he has a chronic cough and this is not changed much.  Patient being referred for admission for shortness of breath.  He is 83% on room air with exertion.  Chest x-ray shows questionable edema.  He denies any chest pain.  Cardiology has been consulted for etiology of his shortness of breath.    Review of Systems: As per HPI otherwise 10 point review of systems negative.   Past Medical History:  Diagnosis Date  . Depression   . Dextrocardia   . Diabetes mellitus   . H. pylori infection 11/09/2012   treated with pylera  . HTN (hypertension)    Cholesterol  . Iron deficiency anemia, unspecified 10/18/2012  . Neuropathy     Past Surgical History:  Procedure Laterality Date  . CATARACT EXTRACTION, BILATERAL  2016  . COLONOSCOPY WITH ESOPHAGOGASTRODUODENOSCOPY (EGD) N/A 11/09/2012   Dr. 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 hpylori on bx- treated with pylera. TCS- normal rectum. 1 diminutive polyp in the mid descending segment. 1-60mm polyp in the mid desending segment o/w the remainder of the colonic mucosa appeared normal. tubular adenoma on bx  . GIVENS CAPSULE STUDY N/A 10/07/2013   no source for anemia or heme positive stool noted  . NECK SURGERY     bone spurs     reports that he has never smoked. His smokeless tobacco use  includes chew. He reports that he does not drink alcohol or use drugs.  No Known Allergies  Family History  Problem Relation Age of Onset  . Diabetes Sister        Fx  . Heart defect Sister        Fx  . Arthritis Sister        Fx  . Asthma Sister        Fx  . Kidney disease Sister        Fx  . Pancreatitis Sister     Prior to Admission medications   Medication Sig Start Date End Date Taking? Authorizing Provider  albuterol (PROAIR HFA) 108 (90 Base) MCG/ACT inhaler Inhale 2 puffs into the lungs every 4 (four) hours as needed for wheezing or shortness of breath. 09/27/16  Yes Valentina Shaggy, MD  azelastine (ASTELIN) 0.1 % nasal spray Place 2 sprays into both nostrils 2 (two) times daily. Use in each nostril as directed 09/27/16  Yes Valentina Shaggy, MD  DEXILANT 60 MG capsule TAKE (1) CAPSULE BY MOUTH EVERY DAY. 08/26/15  Yes [name], PA-C  DULoxetine (CYMBALTA) 60 MG capsule Take 60 mg by mouth daily.   Yes [provider]  folic acid (FOLVITE) 1 MG tablet Take 1 mg by mouth daily.   Yes [provider]  gabapentin (NEURONTIN) 600 MG tablet Take 1 tablet (600 mg total) by mouth 2 (two) times daily. Patient  taking differently: Take 600 mg by mouth 3 (three) times daily.  02/10/18  Yes Triplett, Tammy, PA-C  hydrochlorothiazide (MICROZIDE) 12.5 MG capsule Take 12.5 mg by mouth daily.   Yes [provider]  insulin detemir (LEVEMIR) 100 UNIT/ML injection Inject 30-60 Units into the skin 2 (two) times daily. 30 units in the am and 60 units at qhs   Yes [provider]  losartan (COZAAR) 100 MG tablet Take 100 mg by mouth daily.   Yes [provider]  metFORMIN (GLUCOPHAGE) 1000 MG tablet Take 1,000 mg by mouth 2 (two) times daily with a meal.   Yes [provider]  Multiple Vitamin (MULTIVITAMIN) tablet Take 1 tablet by mouth daily.   Yes [provider]  pioglitazone (ACTOS) 45 MG tablet Take 45 mg by mouth  daily.   Yes [provider]  rOPINIRole (REQUIP) 1 MG tablet Take 1 mg by mouth at bedtime.   Yes [provider]  rosuvastatin (CRESTOR) 40 MG tablet Take 1 tablet by mouth every evening. 03/22/18  Yes [provider]  sitaGLIPtin (JANUVIA) 100 MG tablet Take 100 mg by mouth daily.    Yes [provider]  traMADol (ULTRAM) 50 MG tablet Take 1 tablet (50 mg total) by mouth every 6 (six) hours as needed. 02/10/18  Yes Triplett, Tammy, PA-C  budesonide-formoterol (SYMBICORT) 160-4.5 MCG/ACT inhaler Inhale 2 puffs into the lungs 2 (two) times daily. Patient not taking: Reported on 04/10/2018 09/27/16   Valentina Shaggy, MD  promethazine (PHENERGAN) 12.5 MG tablet Take 12.5 mg by mouth every 6 (six) hours as needed for nausea.  06/27/16   [provider]    Physical Exam: Vitals:   04/10/18 1330 04/10/18 1400 04/10/18 1430 04/10/18 1500  BP: (!) 124/53 107/77 (!) 114/52 136/61  Pulse: 88 88 89 88  Resp: (!) 22 (!) 22 (!) 22 (!) 22  Temp:      TempSrc:      SpO2: 100% 97% 94% 93%  Weight:      Height:          Constitutional: NAD, calm, comfortable obese Vitals:   04/10/18 1330 04/10/18 1400 04/10/18 1430 04/10/18 1500  BP: (!) 124/53 107/77 (!) 114/52 136/61  Pulse: 88 88 89 88  Resp: (!) 22 (!) 22 (!) 22 (!) 22  Temp:      TempSrc:      SpO2: 100% 97% 94% 93%  Weight:      Height:       Eyes: PERRL, lids and conjunctivae normal ENMT: Mucous membranes are moist. Posterior pharynx clear of any exudate or lesions.Normal dentition.  Neck: normal, supple, no masses, no thyromegaly Respiratory: clear to auscultation bilaterally, no wheezing, no crackles. Normal respiratory effort. No accessory muscle use.  Cardiovascular: Regular rate and rhythm heart sounds to the left sternum, no murmurs / rubs / gallops. No extremity edema. 2+ pedal pulses. No carotid bruits.  Abdomen: no tenderness, no masses palpated. No hepatosplenomegaly. Bowel  sounds positive.  Musculoskeletal: no clubbing / cyanosis. No joint deformity upper and lower extremities. Good ROM, no contractures. Normal muscle tone.  Skin: no rashes, lesions, ulcers. No induration Neurologic: CN 2-12 grossly intact. Sensation intact, DTR normal. Strength 5/5 in all 4.  Psychiatric: Normal judgment and insight. Alert and oriented x 3. Normal mood.    Labs on Admission: I have personally reviewed following labs and imaging studies  CBC: Recent Labs  Lab 04/10/18 1308  WBC 12.3*  NEUTROABS 9.6*  HGB 8.2*  HCT 26.1*  MCV 90.9  PLT 099*   Basic Metabolic Panel: Recent Labs  Lab 04/10/18 1308  NA 138  K 3.8  CL 99  CO2 26  GLUCOSE 150*  BUN 14  CREATININE 1.02  CALCIUM 8.6*   GFR: Estimated Creatinine Clearance: 91.5 mL/min (by C-G formula based on SCr of 1.02 mg/dL). Liver Function Tests: Recent Labs  Lab 04/10/18 1308  AST 23  ALT 23  ALKPHOS 85  BILITOT 0.4  PROT 6.5  ALBUMIN 3.1*   No results for input(s): LIPASE, AMYLASE in the last 168 hours. No results for input(s): AMMONIA in the last 168 hours. Coagulation Profile: No results for input(s): INR, PROTIME in the last 168 hours. Cardiac Enzymes: Recent Labs  Lab 04/10/18 1308  TROPONINI 0.30*   BNP (last 3 results) No results for input(s): PROBNP in the last 8760 hours. HbA1C: No results for input(s): HGBA1C in the last 72 hours. CBG: No results for input(s): GLUCAP in the last 168 hours. Lipid Profile: No results for input(s): CHOL, HDL, LDLCALC, TRIG, CHOLHDL, LDLDIRECT in the last 72 hours. Thyroid Function Tests: No results for input(s): TSH, T4TOTAL, FREET4, T3FREE, THYROIDAB in the last 72 hours. Anemia Panel: Recent Labs    04/10/18 1444  RETICCTPCT 1.7   Urine analysis:    Component Value Date/Time   COLORURINE YELLOW 05/13/2015 1440   APPEARANCEUR CLEAR 05/13/2015 1440   LABSPEC <1.005 (L) 05/13/2015 1440   PHURINE 5.5 05/13/2015 1440   GLUCOSEU >1000 (A)  05/13/2015 1440   HGBUR NEGATIVE 05/13/2015 1440   BILIRUBINUR NEGATIVE 05/13/2015 1440   KETONESUR NEGATIVE 05/13/2015 1440   PROTEINUR NEGATIVE 05/13/2015 1440   UROBILINOGEN 0.2 05/13/2015 1440   NITRITE NEGATIVE 05/13/2015 1440   LEUKOCYTESUR NEGATIVE 05/13/2015 1440   Sepsis Labs: !!!!!!!!!!!!!!!!!!!!!!!!!!!!!!!!!!!!!!!!!!!! @LABRCNTIP (procalcitonin:4,lacticidven:4) ) Recent Results (from the past 240 hour(s))  Blood culture (routine x 2)     Status: None (Preliminary result)   Collection Time: 04/10/18  1:08 PM  Result Value Ref Range Status   Specimen Description BLOOD LEFT HAND DRAWN BY RN  Final   Special Requests   Final    BOTTLES DRAWN AEROBIC AND ANAEROBIC Blood Culture adequate volume Performed at [facility], [address] [place], [place] [address]    Culture PENDING  Incomplete   Report Status PENDING  Incomplete  Blood culture (routine x 2)     Status: None (Preliminary result)   Collection Time: 04/10/18  1:27 PM  Result Value Ref Range Status   Specimen Description BLOOD RIGHT HAND  Final   Special Requests   Final    BOTTLES DRAWN AEROBIC AND ANAEROBIC Blood Culture adequate volume Performed at [facility], [address] [place], [place] [address]    Culture PENDING  Incomplete   Report Status PENDING  Incomplete     Radiological Exams on Admission: Dg Chest 2 View  Result Date: 04/10/2018 CLINICAL DATA:  Productive cough, shortness of breath. EXAM: CHEST - 2 VIEW COMPARISON:  Radiographs of October 14, 2016. FINDINGS: The heart size and mediastinal contours are within normal limits. No pneumothorax is noted. Minimal bilateral pleural effusions are noted. Increased bilateral lung opacities are noted concerning for edema or possibly pneumonia. The visualized skeletal structures are unremarkable. IMPRESSION: Increased bilateral lung opacities are noted concerning for edema or possibly pneumonia. Continued radiographic follow-up is recommended.  Electronically Signed   By: Marijo Conception, [name]   On: 04/10/2018 12:21    EKG: Independently reviewed.  Normal sinus rhythm  Old chart reviewed  Case discussed with Dr. Roderic Palau  Chest x-ray reviewed bilateral edema versus pneumonia   Assessment/Plan 67 year old male with what appears to be new onset congestive heart failure Principal Problem:   CHF exacerbation (HCC)-placed on Lasix 4 mg IV every 12 hours.  Suspect he has underlying sleep apnea.  No fever.  White count 12.  Serial cardiac enzymes.  Obtain cardiac echo.  Cardiology consulted.  Placed on aspirin.  Active Problems:   GERD (gastroesophageal reflux disease)-noted    Iron deficiency anemia due to chronic blood loss-continue outpatient treatment has no overt bleeding    Insulin-requiring or dependent type II diabetes mellitus (HCC)-continue Lantus     Kartagener syndrome-noted    Restrictive lung disease-noted and stable       DVT prophylaxis: SCDs Code Status: Full Family Communication: None Disposition Plan: 1 to 3 days Consults called: Cardiology Admission status: Admission   Monroe Qin A MD [facility]  If 7PM-7AM, please contact night-coverage [email or web address] Password [email or web address]  04/10/2018, 3:29 PM

## 2018-04-10 NOTE — ED Notes (Signed)
Pt refused to use a urinal in room. Insisted on walking to bathroom. Walked with pt to bathroom and then used a wheelchair to assist him back. Hooked up pt to monitor and O2 sats dropped to 83%. Put pt on 3L O2 and sats now up to 95%.

## 2018-04-10 NOTE — ED Notes (Signed)
Pt seen by hospitalist. Pt/family aware of plan of care to admit.

## 2018-04-10 NOTE — ED Notes (Signed)
Definit med used for 2 d  Echo at bedside.

## 2018-04-10 NOTE — ED Provider Notes (Signed)
[facility] EMERGENCY DEPARTMENT Provider Note   CSN: [id number] Arrival date & time: 04/10/18  1145     History   Chief Complaint Chief Complaint  Patient presents with  . Shortness of Breath    HPI Brett [name] is a 67 y.o. male.  Patient complains of shortness of breath for 2 weeks.  Mild cough no fever chills.  Occasional clear to yellow sputum production.  The history is provided by the patient. No language interpreter was used.  Shortness of Breath  This is a new problem. The problem occurs continuously.The current episode started more than 1 week ago. The problem has not changed since onset.Pertinent negatives include no fever, no headaches, no cough, no chest pain, no abdominal pain and no rash. It is unknown what precipitated the problem. Risk factors: Asthma. He has tried nothing for the symptoms. The treatment provided no relief.    Past Medical History:  Diagnosis Date  . Depression   . Dextrocardia   . Diabetes mellitus   . H. pylori infection 11/09/2012   treated with pylera  . HTN (hypertension)    Cholesterol  . Iron deficiency anemia, unspecified 10/18/2012  . Neuropathy     Patient Active Problem List   Diagnosis Date Noted  . CHF exacerbation ([place]) 04/10/2018  . 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 Restrictive lung disease 11/08/2016  . 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 Moderate persistent asthma, uncomplicated 82/50/5397  . Thrombocytosis ([place]) 10/09/2014  . Abdominal pain 09/23/2014  . Gastritis, Helicobacter pylori 67/34/1937  . Insulin-requiring or dependent type II diabetes mellitus ([place]) 01/03/2014  . 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 GERD (gastroesophageal reflux disease) 10/18/2012  . Nausea 10/18/2012  . Iron deficiency anemia due to chronic blood loss 10/18/2012  . HIP PAIN 08/31/2010  . SPINAL STENOSIS 06/28/2010  . TENDINITIS, CALCIFIC, SHOULDER, RIGHT 06/14/2010  . IMPINGEMENT SYNDROME 06/14/2010    Past Surgical  History:  Procedure Laterality Date  . CATARACT EXTRACTION, BILATERAL  2016  . COLONOSCOPY WITH ESOPHAGOGASTRODUODENOSCOPY (EGD) N/A 11/09/2012   Dr. 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 hpylori on bx- treated with pylera. TCS- normal rectum. 1 diminutive polyp in the mid descending segment. 1-58mm polyp in the mid desending segment o/w the remainder of the colonic mucosa appeared normal. tubular adenoma on bx  . GIVENS CAPSULE STUDY N/A 10/07/2013   no source for anemia or heme positive stool noted  . NECK SURGERY     bone spurs        Home Medications    Prior to Admission medications   Medication Sig Start Date End Date Taking? Authorizing Provider  albuterol (PROAIR HFA) 108 (90 Base) MCG/ACT inhaler Inhale 2 puffs into the lungs every 4 (four) hours as needed for wheezing or shortness of breath. 09/27/16  Yes Valentina Shaggy, MD  azelastine (ASTELIN) 0.1 % nasal spray Place 2 sprays into both nostrils 2 (two) times daily. Use in each nostril as directed 09/27/16  Yes Valentina Shaggy, MD  DEXILANT 60 MG capsule TAKE (1) CAPSULE BY MOUTH EVERY DAY. 08/26/15  Yes [name], PA-C  DULoxetine (CYMBALTA) 60 MG capsule Take 60 mg by mouth daily.   Yes [provider]  folic acid (FOLVITE) 1 MG tablet Take 1 mg by mouth daily.   Yes [provider]  gabapentin (NEURONTIN) 600 MG tablet Take 1 tablet (600 mg total) by mouth 2 (two) times daily. Patient taking  differently: Take 600 mg by mouth 3 (three) times daily.  02/10/18  Yes Triplett, Tammy, PA-C  hydrochlorothiazide (MICROZIDE) 12.5 MG capsule Take 12.5 mg by mouth daily.   Yes [provider]  insulin detemir (LEVEMIR) 100 UNIT/ML injection Inject 30-60 Units into the skin 2 (two) times daily. 30 units in the am and 60 units at qhs   Yes [provider]  losartan (COZAAR) 100 MG tablet Take 100 mg  by mouth daily.   Yes [provider]  metFORMIN (GLUCOPHAGE) 1000 MG tablet Take 1,000 mg by mouth 2 (two) times daily with a meal.   Yes [provider]  Multiple Vitamin (MULTIVITAMIN) tablet Take 1 tablet by mouth daily.   Yes [provider]  pioglitazone (ACTOS) 45 MG tablet Take 45 mg by mouth daily.   Yes [provider]  rOPINIRole (REQUIP) 1 MG tablet Take 1 mg by mouth at bedtime.   Yes [provider]  rosuvastatin (CRESTOR) 40 MG tablet Take 1 tablet by mouth every evening. 03/22/18  Yes [provider]  sitaGLIPtin (JANUVIA) 100 MG tablet Take 100 mg by mouth daily.    Yes [provider]  traMADol (ULTRAM) 50 MG tablet Take 1 tablet (50 mg total) by mouth every 6 (six) hours as needed. 02/10/18  Yes Triplett, Tammy, PA-C  budesonide-formoterol (SYMBICORT) 160-4.5 MCG/ACT inhaler Inhale 2 puffs into the lungs 2 (two) times daily. Patient not taking: Reported on 04/10/2018 09/27/16   Valentina Shaggy, MD  promethazine (PHENERGAN) 12.5 MG tablet Take 12.5 mg by mouth every 6 (six) hours as needed for nausea.  06/27/16   [provider]    Family History Family History  Problem Relation Age of Onset  . Diabetes Sister        Fx  . Heart defect Sister        Fx  . Arthritis Sister        Fx  . Asthma Sister        Fx  . Kidney disease Sister        Fx  . Pancreatitis Sister     Social History Social History   Tobacco Use  . Smoking status: Never Smoker  . Smokeless tobacco: Current User    Types: Chew  Substance Use Topics  . Alcohol use: No  . Drug use: No     Allergies   Patient has no known allergies.   Review of Systems Review of Systems  Constitutional: Negative for appetite change, fatigue and fever.  HENT: Negative for congestion, ear discharge and sinus pressure.   Eyes: Negative for discharge.  Respiratory: Positive for shortness of breath. Negative for cough.     Cardiovascular: Negative for chest pain.  Gastrointestinal: Negative for abdominal pain and diarrhea.  Genitourinary: Negative for frequency and hematuria.  Musculoskeletal: Negative for back pain.  Skin: Negative for rash.  Neurological: Negative for seizures and headaches.  Psychiatric/Behavioral: Negative for hallucinations.     Physical Exam Updated Vital Signs BP 136/61   Pulse 88   Temp 98.8 F (37.1 C) (Oral)   Resp (!) 22   Ht 5\' 9"  (1.753 m)   Wt 117.9 kg (260 lb)   SpO2 93%   BMI 38.40 kg/m   Physical Exam  Constitutional: He is oriented to person, place, and time. He appears well-developed.  HENT:  Head: Normocephalic.  Eyes: Conjunctivae and EOM are normal. No scleral icterus.  Neck: Neck supple. No thyromegaly present.  Cardiovascular:  Normal rate and regular rhythm. Exam reveals no gallop and no friction rub.  No murmur heard. Pulmonary/Chest: No stridor. He has no wheezes. He has no rales. He exhibits no tenderness.  Abdominal: He exhibits no distension. There is no tenderness. There is no rebound.  Musculoskeletal: Normal range of motion. He exhibits no edema.  Lymphadenopathy:    He has no cervical adenopathy.  Neurological: He is oriented to person, place, and time. He exhibits normal muscle tone. Coordination normal.  Skin: No rash noted. No erythema.  Psychiatric: He has a normal mood and affect. His behavior is normal.     ED Treatments / Results  Labs (all labs ordered are listed, but only abnormal results are displayed) Labs Reviewed  CBC WITH DIFFERENTIAL/PLATELET - Abnormal; Notable for the following components:      Result Value   WBC 12.3 (*)    RBC 2.87 (*)    Hemoglobin 8.2 (*)    HCT 26.1 (*)    Platelets 478 (*)    Neutro Abs 9.6 (*)    All other components within normal limits  BASIC METABOLIC PANEL - Abnormal; Notable for the following components:   Glucose, Bld 150 (*)    Calcium 8.6 (*)    All other components within  normal limits  TROPONIN I - Abnormal; Notable for the following components:   Troponin I 0.30 (*)    All other components within normal limits  HEPATIC FUNCTION PANEL - Abnormal; Notable for the following components:   Albumin 3.1 (*)    All other components within normal limits  BRAIN NATRIURETIC PEPTIDE - Abnormal; Notable for the following components:   B Natriuretic Peptide 231.0 (*)    All other components within normal limits  RETICULOCYTES - Abnormal; Notable for the following components:   RBC. 2.89 (*)    All other components within normal limits  CULTURE, BLOOD (ROUTINE X 2)  CULTURE, BLOOD (ROUTINE X 2)  LACTIC ACID, PLASMA  VITAMIN B12  FOLATE  IRON AND TIBC  FERRITIN  POC OCCULT BLOOD, ED  TYPE AND SCREEN    EKG EKG Interpretation  Date/Time:  Tuesday April 10 2018 11:57:17 EDT Ventricular Rate:  94 PR Interval:  132 QRS Duration: 74 QT Interval:  330 QTC Calculation: 412 R Axis:   102 Text Interpretation:  -------- Normal sinus rhythm Septal infarct , age undetermined Lateral infarct , age undetermined Abnormal ECG Confirmed by Milton Ferguson [phone]) on 04/10/2018 2:18:20 PM   Radiology Dg Chest 2 View  Result Date: 04/10/2018 CLINICAL DATA:  Productive cough, shortness of breath. EXAM: CHEST - 2 VIEW COMPARISON:  Radiographs of October 14, 2016. FINDINGS: The heart size and mediastinal contours are within normal limits. No pneumothorax is noted. Minimal bilateral pleural effusions are noted. Increased bilateral lung opacities are noted concerning for edema or possibly pneumonia. The visualized skeletal structures are unremarkable. IMPRESSION: Increased bilateral lung opacities are noted concerning for edema or possibly pneumonia. Continued radiographic follow-up is recommended. Electronically Signed   By: Marijo Conception, [name]   On: 04/10/2018 12:21    Procedures Procedures (including critical care time)  Medications Ordered in ED Medications  albuterol  (PROVENTIL) (2.5 MG/3ML) 0.083% nebulizer solution 5 mg (5 mg Nebulization Given 04/10/18 1323)  furosemide (LASIX) injection 40 mg (40 mg Intravenous Given 04/10/18 1500)     Initial Impression / Assessment and Plan / ED Course  I have reviewed the triage vital signs and the nursing notes.  Pertinent labs &  imaging results that were available during my care of the patient were reviewed by me and considered in my medical decision making (see chart for details). CRITICAL CARE Performed by: Milton Ferguson Total critical care time: 35 minutes Critical care time was exclusive of separately billable procedures and treating other patients. Critical care was necessary to treat or prevent imminent or life-threatening deterioration. Critical care was time spent personally by me on the following activities: development of treatment plan with patient and/or surrogate as well as nursing, discussions with consultants, evaluation of patient's response to treatment, examination of patient, obtaining history from patient or surrogate, ordering and performing treatments and interventions, ordering and review of laboratory studies, ordering and review of radiographic studies, pulse oximetry and re-evaluation of patient's condition.     Labs show anemia with hemoglobin 8.2 along with elevated troponin and mildly elevated BNP.  Chest x-ray shows infiltrates in the bases probably edema.  I spoke with cardiology and they will consult on the patient and medicine well admit.  Final Clinical Impressions(s) / ED Diagnoses   Final diagnoses:  SOB (shortness of breath)    ED Discharge Orders    None       Milton Ferguson, MD 04/10/18 1523

## 2018-04-10 NOTE — ED Triage Notes (Signed)
Patient complaining of shortness of breath x 2 days. Denies cough or chest pain. Patient's O2 88% on room air in triage.

## 2018-04-11 ENCOUNTER — Other Ambulatory Visit (HOSPITAL_COMMUNITY): Payer: 59

## 2018-04-11 DIAGNOSIS — J189 Pneumonia, unspecified organism: Secondary | ICD-10-CM

## 2018-04-11 DIAGNOSIS — I509 Heart failure, unspecified: Secondary | ICD-10-CM

## 2018-04-11 LAB — RESPIRATORY PANEL BY PCR
ADENOVIRUS-RVPPCR: NOT DETECTED
Bordetella pertussis: NOT DETECTED
CHLAMYDOPHILA PNEUMONIAE-RVPPCR: NOT DETECTED
CORONAVIRUS NL63-RVPPCR: NOT DETECTED
Coronavirus 229E: NOT DETECTED
Coronavirus HKU1: NOT DETECTED
Coronavirus OC43: NOT DETECTED
INFLUENZA A-RVPPCR: NOT DETECTED
INFLUENZA B-RVPPCR: NOT DETECTED
METAPNEUMOVIRUS-RVPPCR: NOT DETECTED
MYCOPLASMA PNEUMONIAE-RVPPCR: NOT DETECTED
PARAINFLUENZA VIRUS 3-RVPPCR: NOT DETECTED
Parainfluenza Virus 1: NOT DETECTED
Parainfluenza Virus 2: NOT DETECTED
Parainfluenza Virus 4: NOT DETECTED
Respiratory Syncytial Virus: NOT DETECTED
Rhinovirus / Enterovirus: NOT DETECTED

## 2018-04-11 LAB — CBC
HCT: 27.7 % — ABNORMAL LOW (ref 39.0–52.0)
Hemoglobin: 8.7 g/dL — ABNORMAL LOW (ref 13.0–17.0)
MCH: 29.4 pg (ref 26.0–34.0)
MCHC: 31.4 g/dL (ref 30.0–36.0)
MCV: 93.6 fL (ref 78.0–100.0)
Platelets: 522 10*3/uL — ABNORMAL HIGH (ref 150–400)
RBC: 2.96 MIL/uL — ABNORMAL LOW (ref 4.22–5.81)
RDW: 13.5 % (ref 11.5–15.5)
WBC: 11 10*3/uL — ABNORMAL HIGH (ref 4.0–10.5)

## 2018-04-11 LAB — GLUCOSE, CAPILLARY
GLUCOSE-CAPILLARY: 156 mg/dL — AB (ref 70–99)
GLUCOSE-CAPILLARY: 269 mg/dL — AB (ref 70–99)
GLUCOSE-CAPILLARY: 110 mg/dL — AB (ref 70–99)
Glucose-Capillary: 144 mg/dL — ABNORMAL HIGH (ref 70–99)

## 2018-04-11 LAB — ECHOCARDIOGRAM COMPLETE
HEIGHTINCHES: 69 in
Weight: 4160 oz

## 2018-04-11 LAB — BASIC METABOLIC PANEL
Anion gap: 9 (ref 5–15)
BUN: 14 mg/dL (ref 8–23)
CALCIUM: 8.8 mg/dL — AB (ref 8.9–10.3)
CO2: 33 mmol/L — ABNORMAL HIGH (ref 22–32)
Chloride: 98 mmol/L (ref 98–111)
Creatinine, Ser: 0.9 mg/dL (ref 0.61–1.24)
GFR calc Af Amer: >60 mL/min (ref >60)
GLUCOSE: 197 mg/dL — AB (ref 70–99)
Potassium: 3.8 mmol/L (ref 3.5–5.1)
Sodium: 140 mmol/L (ref 135–145)

## 2018-04-11 LAB — FOLATE: Folate: 65.7 ng/mL (ref >5.9)

## 2018-04-11 LAB — TROPONIN I: TROPONIN I: 0.35 ng/mL — AB (ref <0.03)

## 2018-04-11 MED ORDER — SODIUM CHLORIDE 0.9 % IV SOLN
500.0000 mg | INTRAVENOUS | Status: DC
  Administered 2018-04-11: 500 mg via INTRAVENOUS
  Filled 2018-04-11 (×3): qty 500

## 2018-04-11 MED ORDER — TRAMADOL HCL 50 MG PO TABS
50.0000 mg | ORAL_TABLET | Freq: Four times a day (QID) | ORAL | Status: DC | PRN
  Administered 2018-04-11 – 2018-04-13 (×3): 50 mg via ORAL
  Filled 2018-04-11 (×3): qty 1

## 2018-04-11 MED ORDER — SODIUM CHLORIDE 0.9 % IV SOLN
1.0000 g | INTRAVENOUS | Status: DC
  Administered 2018-04-11 – 2018-04-15 (×5): 1 g via INTRAVENOUS
  Filled 2018-04-11: qty 1
  Filled 2018-04-11 (×2): qty 10
  Filled 2018-04-11 (×3): qty 1

## 2018-04-11 MED ORDER — ROPINIROLE HCL 1 MG PO TABS: 1.0000 mg | ORAL_TABLET | Freq: Every day | ORAL | Status: DC

## 2018-04-11 MED ORDER — GABAPENTIN 300 MG PO CAPS
600.0000 mg | ORAL_CAPSULE | Freq: Three times a day (TID) | ORAL | Status: DC
  Administered 2018-04-11 – 2018-04-15 (×11): 600 mg via ORAL
  Filled 2018-04-11 (×11): qty 2

## 2018-04-11 MED ORDER — DIPHENHYDRAMINE HCL 25 MG PO CAPS
25.0000 mg | ORAL_CAPSULE | Freq: Once | ORAL | Status: AC
  Administered 2018-04-11: 25 mg via ORAL
  Filled 2018-04-11: qty 1

## 2018-04-11 MED ORDER — LORAZEPAM 0.5 MG PO TABS
0.5000 mg | ORAL_TABLET | Freq: Once | ORAL | Status: AC
  Administered 2018-04-11: 0.5 mg via ORAL
  Filled 2018-04-11: qty 1

## 2018-04-11 NOTE — Progress Notes (Signed)
[facility] PROGRESS NOTE  CECILE GUEVARA WUJ:[id number] DOB: 21-Apr-1951 DOA: 04/10/2018 PCP: [name], MD  Brief summary   67 y.o. male with medical history significant of  depression, diabetes, hypertension, kartagener's syndrome, presented with shortness of breath, fever, chills for several days, found to be hypoxic in ED  Assessment/Plan:  Acute diastolic CHF. Echo. LVEF:  Systolic function was normal. The estimated ejection   fraction was in the range of 60% to 65%. Left ventricular diastolic function parameters were normal. -cont diuresis per cardiology. clinically improved. No s/s of acute fluid overload on exam. Monitor I/o, daily weight, labs.   Positive troponins. Echo: no wall motion abnormalities. He denies acute chest pains to me. Cont per cardiology.   Possible pneumonia. Fever, chills at home, productive cough. CXR: BL infiltrates. We will start empiric iv antibiotic treatment, possible transition to PO regimen to complete the treatment course in AM  Acute hypoxic respiratory failure. Multifactorial: CHF+ PNA. Cont diuresis for HF. Started antibiotic treatment for PNA. Bronchodilators prn. Wean form oxygen. desat study prior to discharge    Anemia. Chronic. No s/s of acute bleeding. Recheck CBC  DM. Monitor on insulin regimen.   Code Status: full Family Communication: d/w patient, his family, RN (indicate person spoken with, relationship, and if by phone, the number) Disposition Plan: home  In 24-48 hrs    Consultants:  Cardiology   Procedures:  Echo   Antibiotics:  Ceftriaxone/azithromycin  (indicate start date, and stop date if known)  HPI/Subjective: Reports productive cough. No acute chest pains   Objective: Vitals:   04/10/18 2111 04/11/18 0510  BP: (!) 154/51 (!) 132/50  Pulse: 88 81  Resp: (!) 21 (!) 21  Temp: 98.3 F (36.8 C) 98.4 F (36.9 C)  SpO2: 94% (!) 87%    Intake/Output Summary (Last 24 hours) at 04/11/2018 0837 Last data  filed at 04/11/2018 0626 Gross per 24 hour  Intake 250 ml  Output 1625 ml  Net -1375 ml   Filed Weights   04/10/18 1152 04/10/18 1719 04/11/18 0510  Weight: 117.9 kg (260 lb) 117.1 kg (258 lb 1.6 oz) 115 kg (253 lb 8.5 oz)    Exam:   General:  No distress   Cardiovascular: s1,s2 rrr  Respiratory: few rales bases  Abdomen: soft, nt   Musculoskeletal: no leg edema    Data Reviewed: Basic Metabolic Panel: Recent Labs  Lab 04/10/18 1308 04/11/18 0404  NA 138 140  K 3.8 3.8  CL 99 98  CO2 26 33*  GLUCOSE 150* 197*  BUN 14 14  CREATININE 1.02 0.90  CALCIUM 8.6* 8.8*   Liver Function Tests: Recent Labs  Lab 04/10/18 1308  AST 23  ALT 23  ALKPHOS 85  BILITOT 0.4  PROT 6.5  ALBUMIN 3.1*   No results for input(s): LIPASE, AMYLASE in the last 168 hours. No results for input(s): AMMONIA in the last 168 hours. CBC: Recent Labs  Lab 04/10/18 1308  WBC 12.3*  NEUTROABS 9.6*  HGB 8.2*  HCT 26.1*  MCV 90.9  PLT 478*   Cardiac Enzymes: Recent Labs  Lab 04/10/18 1308 04/10/18 1557 04/10/18 2143 04/11/18 0404  TROPONINI 0.30* 0.42* 0.43* 0.35*   BNP (last 3 results) Recent Labs    04/10/18 1308  BNP 231.0*    ProBNP (last 3 results) No results for input(s): PROBNP in the last 8760 hours.  CBG: Recent Labs  Lab 04/10/18 1645 04/10/18 2111 04/11/18 0733  GLUCAP 131* 302* 156*  Recent Results (from the past 240 hour(s))  Blood culture (routine x 2)     Status: None (Preliminary result)   Collection Time: 04/10/18  1:08 PM  Result Value Ref Range Status   Specimen Description BLOOD LEFT HAND DRAWN BY RN  Final   Special Requests   Final    BOTTLES DRAWN AEROBIC AND ANAEROBIC Blood Culture adequate volume   Culture   Final    NO GROWTH < 24 HOURS Performed at [facility], [address], [place], [place] [address]    Report Status PENDING  Incomplete  Blood culture (routine x 2)     Status: None (Preliminary result)   Collection Time:  04/10/18  1:27 PM  Result Value Ref Range Status   Specimen Description BLOOD RIGHT HAND  Final   Special Requests   Final    BOTTLES DRAWN AEROBIC AND ANAEROBIC Blood Culture adequate volume   Culture   Final    NO GROWTH < 24 HOURS Performed at [facility], [address], [place], [place] [address]    Report Status PENDING  Incomplete     Studies: Dg Chest 2 View  Result Date: 04/10/2018 CLINICAL DATA:  Productive cough, shortness of breath. EXAM: CHEST - 2 VIEW COMPARISON:  Radiographs of October 14, 2016. FINDINGS: The heart size and mediastinal contours are within normal limits. No pneumothorax is noted. Minimal bilateral pleural effusions are noted. Increased bilateral lung opacities are noted concerning for edema or possibly pneumonia. The visualized skeletal structures are unremarkable. IMPRESSION: Increased bilateral lung opacities are noted concerning for edema or possibly pneumonia. Continued radiographic follow-up is recommended. Electronically Signed   By: Marijo Conception, [name]   On: 04/10/2018 12:21    Scheduled Meds: . aspirin EC  81 mg Oral Daily  . azelastine  2 spray Each Nare BID  . DULoxetine  60 mg Oral Daily  . folic acid  1 mg Oral Daily  . furosemide  40 mg Intravenous Q12H  . hydrochlorothiazide  12.5 mg Oral Daily  . insulin aspart  0-9 Units Subcutaneous TID WC  . insulin detemir  30 Units Subcutaneous BID  . losartan  100 mg Oral Daily  . multivitamin with minerals  1 tablet Oral Daily  . pneumococcal 23 valent vaccine  0.5 mL Intramuscular Tomorrow-1000  . rOPINIRole  1 mg Oral QHS  . rosuvastatin  40 mg Oral QPM  . sodium chloride flush  3 mL Intravenous Q12H   Continuous Infusions: . sodium chloride      Principal Problem:   CHF exacerbation (HCC) Active Problems:   GERD (gastroesophageal reflux disease)   Iron deficiency anemia due to chronic blood loss   Insulin-requiring or dependent type II diabetes mellitus ([place])   Kartagener syndrome    Restrictive lung disease    Time spent: >35 minutes     Kinnie Feil  [facility] Pager [phone]. If 7PM-7AM, please contact night-coverage at [email or web address], password [facility] 04/11/2018, 8:37 AM  LOS: 1 day

## 2018-04-11 NOTE — Progress Notes (Signed)
CRITICAL VALUE ALERT  Critical Value:  Troponin 0.43  Date & Time Notified:  04/10/18 @ 2245  Provider Notified: Dr. Kennon Holter   Orders Received/Actions taken: No new orders.

## 2018-04-11 NOTE — Progress Notes (Addendum)
Progress Note  Patient Name: Brett [name] Date of Encounter: 04/11/2018  Primary Cardiologist: Carlyle Dolly, MD   Subjective   Reports his breathing has somewhat improved but not at baseline. Denies any chest pain or palpitations. His wife does report she has noticed his progressive abdominal distension over the past several weeks.   Inpatient Medications    Scheduled Meds: . aspirin EC  81 mg Oral Daily  . azelastine  2 spray Each Nare BID  . DULoxetine  60 mg Oral Daily  . folic acid  1 mg Oral Daily  . furosemide  40 mg Intravenous Q12H  . hydrochlorothiazide  12.5 mg Oral Daily  . insulin aspart  0-9 Units Subcutaneous TID WC  . insulin detemir  30 Units Subcutaneous BID  . losartan  100 mg Oral Daily  . multivitamin with minerals  1 tablet Oral Daily  . pneumococcal 23 valent vaccine  0.5 mL Intramuscular Tomorrow-1000  . rOPINIRole  1 mg Oral QHS  . rosuvastatin  40 mg Oral QPM  . sodium chloride flush  3 mL Intravenous Q12H   Continuous Infusions: . sodium chloride     PRN Meds: sodium chloride, acetaminophen, albuterol, ondansetron (ZOFRAN) IV, promethazine, sodium chloride flush   Vital Signs    Vitals:   04/10/18 1719 04/10/18 2002 04/10/18 2111 04/11/18 0510  BP: (!) 150/57  (!) 154/51 (!) 132/50  Pulse: 88  88 81  Resp: (!) 21  (!) 21 (!) 21  Temp: 98.3 F (36.8 C)  98.3 F (36.8 C) 98.4 F (36.9 C)  TempSrc: Oral  Oral Oral  SpO2: 98% 93% 94% (!) 87%  Weight: 258 lb 1.6 oz (117.1 kg)   253 lb 8.5 oz (115 kg)  Height: 5\' 7"  (1.702 m)       Intake/Output Summary (Last 24 hours) at 04/11/2018 0800 Last data filed at 04/11/2018 1696 Gross per 24 hour  Intake 250 ml  Output 1625 ml  Net -1375 ml   Filed Weights   04/10/18 1152 04/10/18 1719 04/11/18 0510  Weight: 260 lb (117.9 kg) 258 lb 1.6 oz (117.1 kg) 253 lb 8.5 oz (115 kg)    Telemetry    NSR, HR in 70's to 80's. No ectopic events. - Personally Reviewed  ECG    NSR, HR 94, with  RAD. No acute ST changes when compared to prior tracings.  - Personally Reviewed  Physical Exam   General: Well developed, obese Caucasian male appearing in no acute distress. Head: Normocephalic, atraumatic.  Neck: Supple without bruits, JVD not elevated. Lungs:  Resp regular and unlabored, rales along left base. Mild expiratory wheezing throughout. Heart: RRR, S1, S2, no S3, S4, or murmur; no rub. Abdomen: Soft, non-tender, with normoactive bowel sounds. No hepatomegaly. No rebound/guarding. No obvious abdominal masses. Appears distended Extremities: No clubbing, cyanosis, or edema. Distal pedal pulses are 2+ bilaterally. SCD's in place.  Neuro: Alert and oriented X 3. Moves all extremities spontaneously. Psych: Normal affect.  Labs    Chemistry Recent Labs  Lab 04/10/18 1308 04/11/18 0404  NA 138 140  K 3.8 3.8  CL 99 98  CO2 26 33*  GLUCOSE 150* 197*  BUN 14 14  CREATININE 1.02 0.90  CALCIUM 8.6* 8.8*  PROT 6.5  --   ALBUMIN 3.1*  --   AST 23  --   ALT 23  --   ALKPHOS 85  --   BILITOT 0.4  --   GFRNONAA >60 >60  GFRAA >  60 >60  ANIONGAP 13 9     Hematology Recent Labs  Lab 04/10/18 1308 04/10/18 1444  WBC 12.3*  --   RBC 2.87* 2.89*  HGB 8.2*  --   HCT 26.1*  --   MCV 90.9  --   MCH 28.6  --   MCHC 31.4  --   RDW 13.3  --   PLT 478*  --     Cardiac Enzymes Recent Labs  Lab 04/10/18 1308 04/10/18 1557 04/10/18 2143 04/11/18 0404  TROPONINI 0.30* 0.42* 0.43* 0.35*   No results for input(s): TROPIPOC in the last 168 hours.   BNP Recent Labs  Lab 04/10/18 1308  BNP 231.0*     DDimer No results for input(s): DDIMER in the last 168 hours.   Radiology    Dg Chest 2 View  Result Date: 04/10/2018 CLINICAL DATA:  Productive cough, shortness of breath. EXAM: CHEST - 2 VIEW COMPARISON:  Radiographs of October 14, 2016. FINDINGS: The heart size and mediastinal contours are within normal limits. No pneumothorax is noted. Minimal bilateral pleural  effusions are noted. Increased bilateral lung opacities are noted concerning for edema or possibly pneumonia. The visualized skeletal structures are unremarkable. IMPRESSION: Increased bilateral lung opacities are noted concerning for edema or possibly pneumonia. Continued radiographic follow-up is recommended. Electronically Signed   By: Marijo Conception, [name]   On: 04/10/2018 12:21    Cardiac Studies   Echocardiogram: 04/10/2018 Study Conclusions  - Left ventricle: The cavity size was normal. Wall thickness was   normal. Systolic function was normal. The estimated ejection   fraction was in the range of 60% to 65%. Left ventricular   diastolic function parameters were normal. - Aortic valve: Mildly calcified annulus. Mildly thickened   leaflets. - Technically difficult study. Echocontrast was used to enhance   visualization.   Patient Profile     67 y.o. 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   Assessment & Plan    1. Worsening Dyspnea in the setting of Acute Diastolic CHF Exacerbation and known Kartagener's Syndrome - presented with worsening dyspnea and coughing in the setting of subjective fever and chills. WBC elevated to 12.3 on admission and BNP elevated to 231. CXR shows increased bilateral lung opacities concerning for edema versus possible PNA.  - he has been started on IV Lasix 40mg  BID with a recorded net output of -1.3L thus far with weight having declined by 5 lbs since admission (258 --> 253 lbs). Would continue with IV Lasix today as kidney function remains stable and symptoms are improving. Follow I&O's along with daily weights. Also suspect a pulmonary etiology as well (the patient's wife reports his Hospitalist mentioned starting antibiotic therapy today). Would  also benefit from scheduled nebulizer treatments in the setting of his wheezing and bronchiectasis.  2. Elevated Troponin - he did report episodes of chest tightness upon admission which was worse with deep inspiration and coughing, lasting for hours at a time. Most consistent with a pleuritic etiology.  - cyclic troponin values have been flat at 0.30, 0.42, 0.43, and 0.35 --> most consistent with demand ischemia in the setting of his acute illness. EKG shows no acute ischemic changes.  - echocardiogram shows a preserved EF of 60-65% with no regional WMA. No plans for further inpatient ischemic evaluation at  this time, especially in the setting of his worsening anemia.   3. Chronic Anemia - Hgb at 11.6 in 11/2017, now down to 8.2. B12 and Folate WNL. Iron down to 12. He denies any evidence of active bleeding. Suspect this is contributing to his worsening dyspnea as well.  - further evaluation per admitting team. Followed by Hematology as an outpatient.   For questions or updates, please contact Ruth Please consult [email or web address] for contact info under Cardiology/STEMI.   Signed, Erma Heritage , PA-C 8:00 AM 04/11/2018 Pager: [phone]  Patient seen and discussed with PA Ahmed Prima, I agree with her documentation. Admitted with SOB. Inconsistent historian, on my interview her had reported cold like symptoms with fevers/chills, productive cough and wheezing. Elevated WBC on admission, CXR edema vs pneumonia. From H&P did not report these same symptoms. He has some evidence of fluid overload but not convincing his presentation is solely due to heart failure. His cardiac function is actually quite normal by echo. Trop peak to 0.43 and trending down. Atypical chest tightness worst with breathing and coughing, isolated episode without recurrence. EKG without acute ischemia.  Unclear if demand ischemia from his presentation or truly from ischemic heart disease. Was not started on hep due  to significant anemia 8.2 on admission. We will decide as admission progresses if ischemic evaluation is indicated  He is negative 1.4 L overnight on lasix 40mg  IV bid. Downtrend in Cr with diuresis, continue diuretics today. F/u respiratory panel. Further pulmonary management by primary team.   Carlyle Dolly MD

## 2018-04-12 DIAGNOSIS — I5031 Acute diastolic (congestive) heart failure: Secondary | ICD-10-CM

## 2018-04-12 DIAGNOSIS — K219 Gastro-esophageal reflux disease without esophagitis: Secondary | ICD-10-CM

## 2018-04-12 LAB — BASIC METABOLIC PANEL
ANION GAP: 11 (ref 5–15)
BUN: 19 mg/dL (ref 8–23)
CALCIUM: 9.5 mg/dL (ref 8.9–10.3)
CHLORIDE: 97 mmol/L — AB (ref 98–111)
CO2: 34 mmol/L — ABNORMAL HIGH (ref 22–32)
CREATININE: 1.13 mg/dL (ref 0.61–1.24)
GFR calc Af Amer: >60 mL/min (ref >60)
GFR calc non Af Amer: >60 mL/min (ref >60)
Glucose, Bld: 181 mg/dL — ABNORMAL HIGH (ref 70–99)
Potassium: 4.4 mmol/L (ref 3.5–5.1)
SODIUM: 142 mmol/L (ref 135–145)

## 2018-04-12 LAB — PROCALCITONIN

## 2018-04-12 LAB — GLUCOSE, CAPILLARY
GLUCOSE-CAPILLARY: 211 mg/dL — AB (ref 70–99)
Glucose-Capillary: 160 mg/dL — ABNORMAL HIGH (ref 70–99)
Glucose-Capillary: 338 mg/dL — ABNORMAL HIGH (ref 70–99)
Glucose-Capillary: 185 mg/dL — ABNORMAL HIGH (ref 70–99)

## 2018-04-12 LAB — CBC
HCT: 32 % — ABNORMAL LOW (ref 39.0–52.0)
HEMOGLOBIN: 10 g/dL — AB (ref 13.0–17.0)
MCH: 29.7 pg (ref 26.0–34.0)
MCHC: 31.3 g/dL (ref 30.0–36.0)
MCV: 95 fL (ref 78.0–100.0)
PLATELETS: 691 10*3/uL — AB (ref 150–400)
RBC: 3.37 MIL/uL — ABNORMAL LOW (ref 4.22–5.81)
RDW: 13.6 % (ref 11.5–15.5)
WBC: 14.3 10*3/uL — AB (ref 4.0–10.5)

## 2018-04-12 MED ORDER — IPRATROPIUM-ALBUTEROL 0.5-2.5 (3) MG/3ML IN SOLN
3.0000 mL | Freq: Four times a day (QID) | RESPIRATORY_TRACT | Status: DC
  Administered 2018-04-12 – 2018-04-15 (×11): 3 mL via RESPIRATORY_TRACT
  Filled 2018-04-12 (×11): qty 3

## 2018-04-12 MED ORDER — METOPROLOL TARTRATE 5 MG/5ML IV SOLN
5.0000 mg | Freq: Once | INTRAVENOUS | Status: AC
  Administered 2018-04-12: 5 mg via INTRAVENOUS
  Filled 2018-04-12: qty 5

## 2018-04-12 MED ORDER — NICOTINE 7 MG/24HR TD PT24
7.0000 mg | MEDICATED_PATCH | Freq: Every day | TRANSDERMAL | Status: DC
  Administered 2018-04-12 – 2018-04-13 (×2): 7 mg via TRANSDERMAL
  Filled 2018-04-12 (×2): qty 1

## 2018-04-12 MED ORDER — ZOLPIDEM TARTRATE 5 MG PO TABS: 5.0000 mg | ORAL_TABLET | Freq: Once | ORAL | Status: DC

## 2018-04-12 MED ORDER — AZITHROMYCIN 250 MG PO TABS
500.0000 mg | ORAL_TABLET | Freq: Every day | ORAL | Status: DC
  Administered 2018-04-13 – 2018-04-15 (×3): 500 mg via ORAL
  Filled 2018-04-12 (×3): qty 2

## 2018-04-12 MED ORDER — ENOXAPARIN SODIUM 40 MG/0.4ML ~~LOC~~ SOLN
40.0000 mg | SUBCUTANEOUS | Status: DC
  Administered 2018-04-12 – 2018-04-14 (×3): 40 mg via SUBCUTANEOUS
  Filled 2018-04-12 (×3): qty 0.4

## 2018-04-12 MED ORDER — SODIUM CHLORIDE 0.9 % IV SOLN
500.0000 mg | Freq: Once | INTRAVENOUS | Status: AC
  Administered 2018-04-12: 500 mg via INTRAVENOUS
  Filled 2018-04-12: qty 500

## 2018-04-12 MED ORDER — SODIUM CHLORIDE 3 % IN NEBU
4.0000 mL | INHALATION_SOLUTION | Freq: Three times a day (TID) | RESPIRATORY_TRACT | Status: AC
  Administered 2018-04-12 – 2018-04-14 (×8): 4 mL via RESPIRATORY_TRACT
  Filled 2018-04-12 (×9): qty 4

## 2018-04-12 NOTE — Progress Notes (Signed)
Called to evaluate pt due to decreased spo2 on 6lpm cann pt was 76% and slowing increasing. Pt wheezing. Neb treatment given with Duoneb at this time. I asked pt if he ears o2 at home "No jus t take inhaler". Nurse informed that treatment given and o2 decreased back down to 2lpm cann due to order for home o2 evaluation.

## 2018-04-12 NOTE — Evaluation (Signed)
Physical Therapy Evaluation Patient Details Name: Brett [name] MRN: [id number] DOB: 1950/10/19 Today's Date: 04/12/2018   History of Present Illness  Brett [name] is a 67 y.o. male with medical history significant of shortness of breath for several days with no associated nausea vomiting diarrhea fevers.  Patient denies any swelling.  He does report he chronically has a problem lying flat and he has sleeping issues.  His wife reports he snores a lot.  He does not get sick a lot due to his Kartagener's syndrome.  Denies wheezing.  Patient reports he has a chronic cough and this is not changed much.  Patient being referred for admission for shortness of breath.  He is 83% on room air with exertion.  Chest x-ray shows questionable edema.  He denies any chest pain.  Cardiology has been consulted for etiology of his shortness of breath.    Clinical Impression  Patient functioning at baseline for functional mobility and gait, only had difficulty sitting up with head of bed flat, otherwise ambulated in hallway without loss of balance using single point cane.  Patient ambulated on room air, but O2 saturation dropped to 85% and O2 had to be raised to 3 LPM to maintain O2 saturation at 89% while walking. Patient encouraged to ambulate with nursing staff and family members daily as tolerated for length of stay.  Plan:  Patient discharged from physical therapy to care of nursing for ambulation daily as tolerated for length of stay.    Follow Up Recommendations No PT follow up;Supervision - Intermittent    Equipment Recommendations  None recommended by PT    Recommendations for Other Services       Precautions / Restrictions Precautions Precautions: None Restrictions Weight Bearing Restrictions: No      Mobility  Bed Mobility Overal bed mobility: Modified Independent             General bed mobility comments: with head of bed raises, requires Min assist when bed flat  Transfers Overall  transfer level: Modified independent Equipment used: Straight cane                Ambulation/Gait Ambulation/Gait assistance: Supervision Gait Distance (Feet): 100 Feet Assistive device: Straight cane Gait Pattern/deviations: Decreased step length - right;Decreased step length - left;Decreased stride length Gait velocity: slow   General Gait Details: demonstrates slow slightly labored cadence with short step/stride length which is baseline for him per patient, on room, desaturated to 85%, had to increase O2 to 3 LPM to maintain at 89%  Stairs            Wheelchair Mobility    Modified Rankin (Stroke Patients Only)       Balance Overall balance assessment: Mild deficits observed, not formally tested                                           Pertinent Vitals/Pain Pain Assessment: No/denies pain    Home Living Family/patient expects to be discharged to:: Private residence Living Arrangements: Spouse/significant other Available Help at Discharge: Family Type of Home: House Home Access: Stairs to enter Entrance Stairs-Rails: Left Entrance Stairs-Number of Steps: 5 Home Layout: Laundry or work area in [place]: Kasandra Knudsen - single point;Shower seat;Bedside commode;Wheelchair - manual      Prior Function Level of Independence: Independent with assistive device(s)  Comments: community ambulator with SPC, drives     Hand Dominance        Extremity/Trunk Assessment   Upper Extremity Assessment Upper Extremity Assessment: Overall WFL for tasks assessed    Lower Extremity Assessment Lower Extremity Assessment: Overall WFL for tasks assessed    Cervical / Trunk Assessment Cervical / Trunk Assessment: Normal  Communication   Communication: No difficulties  Cognition Arousal/Alertness: Awake/alert Behavior During Therapy: WFL for tasks assessed/performed Overall Cognitive Status: Within Functional Limits for tasks  assessed                                        General Comments      Exercises     Assessment/Plan    PT Assessment Patent does not need any further PT services  PT Problem List         PT Treatment Interventions      PT Goals (Current goals can be found in the Care Plan section)  Acute Rehab PT Goals Patient Stated Goal: return home PT Goal Formulation: With patient/family Time For Goal Achievement: 04/12/18 Potential to Achieve Goals: Good    Frequency     Barriers to discharge        Co-evaluation               AM-PAC PT "6 Clicks" Daily Activity  Outcome Measure Difficulty turning over in bed (including adjusting bedclothes, sheets and blankets)?: None Difficulty moving from lying on back to sitting on the side of the bed? : A Little Difficulty sitting down on and standing up from a chair with arms (e.g., wheelchair, bedside commode, etc,.)?: None Help needed moving to and from a bed to chair (including a wheelchair)?: None Help needed walking in hospital room?: None Help needed climbing 3-5 steps with a railing? : A Little 6 Click Score: 22    End of Session Equipment Utilized During Treatment: Oxygen Activity Tolerance: Patient tolerated treatment well;Patient limited by fatigue Patient left: in bed;with call bell/phone within reach;with family/visitor present(seated at bedsde) Nurse Communication: Mobility status PT Visit Diagnosis: Unsteadiness on feet (R26.81);Other abnormalities of gait and mobility (R26.89);Muscle weakness (generalized) (M62.81)    Time: 9562-1308 PT Time Calculation (min) (ACUTE ONLY): 35 min   Charges:   PT Evaluation $PT Eval Moderate Complexity: 1 Mod PT Treatments $Therapeutic Activity: 23-37 mins        11:15 AM, 04/12/18 Lonell Grandchild, MPT Physical Therapist with [facility] 336 [phone] office [phone] mobile phone

## 2018-04-12 NOTE — Progress Notes (Signed)
SATURATION QUALIFICATIONS: (This note is used to comply with regulatory documentation for home oxygen)  Patient Saturations on Room Air at Rest = 86%  Patient Saturations on Room Air while Ambulating = 85%  Patient Saturations on 3 Liters of oxygen while Ambulating = 89%

## 2018-04-12 NOTE — Progress Notes (Signed)
Called to the room by the nurse tech.  Patient short of breath and wheezing.  On further inspection patient did not have Oxygen on and his O2 saturation was 70.  Patient placed back on O2 and respiratory accessed.  Patient currently in the low 90's on 2 liters of Oxygen. Will pass to the next nurse to reevaluate.

## 2018-04-12 NOTE — Progress Notes (Signed)
PROGRESS NOTE  Brett [name] EUM:[id number] DOB: [birth year] DOA: 04/10/2018 PCP: [name], MD  HPI/Recap of past 24 hours: 67 y.o.malewith medical history significant of depression, diabetes, hypertension, kartagener's syndrome, presented with shortness of breath, fever, chills for several days, found to be hypoxic in ED. Admitted for acute diastolic CHF.  01/13/85: Patient seen and examined with his wife at bedside.  He reports in the last 5 years has been having recurrent sinusitis.  But has never been diagnosed with pneumonia.  He admits to dyspnea with minimal exertion.  He denies any chest pain or palpitation.  Home O2 evaluation done with hypoxia requiring continuous O2 supplementation 3 L.  Assessment/Plan: Principal Problem:   CHF exacerbation (HCC) Active Problems:   GERD (gastroesophageal reflux disease)   Iron deficiency anemia due to chronic blood loss   Insulin-requiring or dependent type II diabetes mellitus (HCC)   Kartagener syndrome   Restrictive lung disease  Acute hypoxic respiratory failure suspect secondary to community-acquired pneumonia versus acute pulmonary edema from acute diastolic CHF Independently reviewed chest x-ray done on 04/10/2018 which revealed dextrocardia and bilateral pulmonary infiltrates Continue IV azithromycin and IV ceftriaxone Continue to monitor fever curve Obtain CBC in the morning Continue nebs Added pulmonary toilet with hypersaline nebs 3 times daily O2 supplementation to maintain O2 saturation between 88 and 92% Will need pulmonology follow-up outpatient due to underlying condition with Kartagener syndrome  Elevated troponin suspect demand ischemia Cardiology consulted and following Asymptomatic Denies chest pain Troponin peaked at 0.43 and trended down Last 2D echo done on 04/10/2018 revealed normal LVEF with normal wall motion Twelve-lead EKG done on admission independently reviewed revealed sinus rhythm with rate of 94  with no specific ST-T changes  Kartagener syndrome Will need to follow-up with pulmonology outpatient  Unspecified thrombocytosis Platelets 691k Obtain peripheral smear  Chronic normocytic anemia Hemoglobin 10 Negative FOBT No sign of overt bleeding Repeat CBC in the morning  Obesity Recommend weight loss outpatient Regular physical activity and healthy dieting    Code Status: Full code  Family Communication: Wife at bedside  Disposition Plan: Home in 1 to 2 days when clinically stable   Consultants:  Cardiology  Procedures:  None  Antimicrobials:  IV azithromycin  IV ceftriaxone  DVT prophylaxis: Subcu Lovenox daily   Objective: Vitals:   04/11/18 2209 04/12/18 0644 04/12/18 0650 04/12/18 0700  BP: (!) 115/46 (!) 136/53    Pulse: 87 (!) 113    Resp: 20 20    Temp: 98.1 F (36.7 C) 99.1 F (37.3 C)    TempSrc: Oral Oral    SpO2: 92% 91% 94%   Weight:    115.1 kg (253 lb 12 oz)  Height:        Intake/Output Summary (Last 24 hours) at 04/12/2018 1241 Last data filed at 04/12/2018 7619 Gross per 24 hour  Intake 1400.18 ml  Output 600 ml  Net 800.18 ml   Filed Weights   04/10/18 1719 04/11/18 0510 04/12/18 0700  Weight: 117.1 kg (258 lb 1.6 oz) 115 kg (253 lb 8.5 oz) 115.1 kg (253 lb 12 oz)    Exam:  . General: 67 y.o. year-old male well developed well nourished uncomfortable due to dyspnea with minimal exertion.  Alert and oriented x3. . Cardiovascular: Regular rate and rhythm with no rubs or gallops.  No thyromegaly or JVD noted.  No lower extremity edema.  Dextrocardia noted. Marland Kitchen Respiratory: Diffuse rales bilaterally with good inspiratory effort. . Abdomen: Soft nontender nondistended with  normal bowel sounds x4 quadrants. . Musculoskeletal: Moves all 4 extremities without any difficulty.  2/4 pulses in all 4 extremities. Marland Kitchen Psychiatry: Mood is appropriate for condition and setting   Data Reviewed: CBC: Recent Labs  Lab 04/10/18 1308  04/11/18 0404 04/12/18 0408  WBC 12.3* 11.0* 14.3*  NEUTROABS 9.6*  --   --   HGB 8.2* 8.7* 10.0*  HCT 26.1* 27.7* 32.0*  MCV 90.9 93.6 95.0  PLT 478* 522* 211*   Basic Metabolic Panel: Recent Labs  Lab 04/10/18 1308 04/11/18 0404 04/12/18 0408  NA 138 140 142  K 3.8 3.8 4.4  CL 99 98 97*  CO2 26 33* 34*  GLUCOSE 150* 197* 181*  BUN 14 14 19   CREATININE 1.02 0.90 1.13  CALCIUM 8.6* 8.8* 9.5   GFR: Estimated Creatinine Clearance: 79 mL/min (by C-G formula based on SCr of 1.13 mg/dL). Liver Function Tests: Recent Labs  Lab 04/10/18 1308  AST 23  ALT 23  ALKPHOS 85  BILITOT 0.4  PROT 6.5  ALBUMIN 3.1*   No results for input(s): LIPASE, AMYLASE in the last 168 hours. No results for input(s): AMMONIA in the last 168 hours. Coagulation Profile: No results for input(s): INR, PROTIME in the last 168 hours. Cardiac Enzymes: Recent Labs  Lab 04/10/18 1308 04/10/18 1557 04/10/18 2143 04/11/18 0404  TROPONINI 0.30* 0.42* 0.43* 0.35*   BNP (last 3 results) No results for input(s): PROBNP in the last 8760 hours. HbA1C: No results for input(s): HGBA1C in the last 72 hours. CBG: Recent Labs  Lab 04/11/18 1116 04/11/18 1601 04/11/18 2206 04/12/18 0755 04/12/18 1057  GLUCAP 269* 110* 144* 160* 211*   Lipid Profile: No results for input(s): CHOL, HDL, LDLCALC, TRIG, CHOLHDL, LDLDIRECT in the last 72 hours. Thyroid Function Tests: No results for input(s): TSH, T4TOTAL, FREET4, T3FREE, THYROIDAB in the last 72 hours. Anemia Panel: Recent Labs    04/10/18 1441 04/10/18 1444  VITAMINB12 649  --   FOLATE 65.7  --   FERRITIN 160  --   TIBC 412  --   IRON 12*  --   RETICCTPCT  --  1.7   Urine analysis:    Component Value Date/Time   COLORURINE YELLOW 05/13/2015 1440   APPEARANCEUR CLEAR 05/13/2015 1440   LABSPEC <1.005 (L) 05/13/2015 1440   PHURINE 5.5 05/13/2015 1440   GLUCOSEU >1000 (A) 05/13/2015 1440   HGBUR NEGATIVE 05/13/2015 1440   BILIRUBINUR  NEGATIVE 05/13/2015 1440   KETONESUR NEGATIVE 05/13/2015 1440   PROTEINUR NEGATIVE 05/13/2015 1440   UROBILINOGEN 0.2 05/13/2015 1440   NITRITE NEGATIVE 05/13/2015 1440   LEUKOCYTESUR NEGATIVE 05/13/2015 1440   Sepsis Labs: @LABRCNTIP (procalcitonin:4,lacticidven:4)  ) Recent Results (from the past 240 hour(s))  Blood culture (routine x 2)     Status: None (Preliminary result)   Collection Time: 04/10/18  1:08 PM  Result Value Ref Range Status   Specimen Description BLOOD LEFT HAND DRAWN BY RN  Final   Special Requests   Final    BOTTLES DRAWN AEROBIC AND ANAEROBIC Blood Culture adequate volume   Culture   Final    NO GROWTH 2 DAYS Performed at [facility], [address] [place], [place] [address]    Report Status PENDING  Incomplete  Blood culture (routine x 2)     Status: None (Preliminary result)   Collection Time: 04/10/18  1:27 PM  Result Value Ref Range Status   Specimen Description BLOOD RIGHT HAND  Final   Special Requests  Final    BOTTLES DRAWN AEROBIC AND ANAEROBIC Blood Culture adequate volume   Culture   Final    NO GROWTH 2 DAYS Performed at [facility], [address], [place], [place] [address]    Report Status PENDING  Incomplete  Respiratory Panel by PCR     Status: None   Collection Time: 04/11/18  8:23 AM  Result Value Ref Range Status   Adenovirus NOT DETECTED NOT DETECTED Final   Coronavirus 229E NOT DETECTED NOT DETECTED Final   Coronavirus HKU1 NOT DETECTED NOT DETECTED Final   Coronavirus NL63 NOT DETECTED NOT DETECTED Final   Coronavirus OC43 NOT DETECTED NOT DETECTED Final   Metapneumovirus NOT DETECTED NOT DETECTED Final   Rhinovirus / Enterovirus NOT DETECTED NOT DETECTED Final   Influenza A NOT DETECTED NOT DETECTED Final   Influenza B NOT DETECTED NOT DETECTED Final   Parainfluenza Virus 1 NOT DETECTED NOT DETECTED Final   Parainfluenza Virus 2 NOT DETECTED NOT DETECTED Final   Parainfluenza Virus 3 NOT DETECTED NOT DETECTED Final    Parainfluenza Virus 4 NOT DETECTED NOT DETECTED Final   Respiratory Syncytial Virus NOT DETECTED NOT DETECTED Final   Bordetella pertussis NOT DETECTED NOT DETECTED Final   Chlamydophila pneumoniae NOT DETECTED NOT DETECTED Final   Mycoplasma pneumoniae NOT DETECTED NOT DETECTED Final    Comment: Performed at [facility], [place] [address] [place], [place] [address]      Studies: No results found.  Scheduled Meds: . aspirin EC  81 mg Oral Daily  . azelastine  2 spray Each Nare BID  . [START ON 04/13/2018] azithromycin  500 mg Oral Daily  . DULoxetine  60 mg Oral Daily  . folic acid  1 mg Oral Daily  . gabapentin  600 mg Oral TID  . hydrochlorothiazide  12.5 mg Oral Daily  . insulin aspart  0-9 Units Subcutaneous TID WC  . insulin detemir  30 Units Subcutaneous BID  . ipratropium-albuterol  3 mL Nebulization Q6H  . losartan  100 mg Oral Daily  . multivitamin with minerals  1 tablet Oral Daily  . nicotine  7 mg Transdermal Daily  . rOPINIRole  1 mg Oral QHS  . rosuvastatin  40 mg Oral QPM  . sodium chloride flush  3 mL Intravenous Q12H  . sodium chloride HYPERTONIC  4 mL Nebulization TID    Continuous Infusions: . sodium chloride 250 mL (04/11/18 0926)  . cefTRIAXone (ROCEPHIN)  IV Stopped (04/12/18 6073)     LOS: 2 days     Kayleen Memos, MD [facility] Pager [phone]  If 7PM-7AM, please contact night-coverage [email or web address] Password [facility] 04/12/2018, 12:41 PM

## 2018-04-12 NOTE — Progress Notes (Addendum)
Progress Note  Patient Name: Brett [name] Date of Encounter: 04/12/2018  Primary Cardiologist: Carlyle Dolly, MD   Subjective   He denies any chest discomfort or palpitations. Breathing still not at baseline. Desaturated into the 70's this morning while on RA with improvement into the 90's with placement of 3L [place]. Still having a productive cough.   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   Inpatient Medications    Scheduled Meds: . aspirin EC  81 mg Oral Daily  . azelastine  2 spray Each Nare BID  . DULoxetine  60 mg Oral Daily  . folic acid  1 mg Oral Daily  . furosemide  40 mg Intravenous Q12H  . gabapentin  600 mg Oral TID  . hydrochlorothiazide  12.5 mg Oral Daily  . insulin aspart  0-9 Units Subcutaneous TID WC  . insulin detemir  30 Units Subcutaneous BID  . ipratropium-albuterol  3 mL Nebulization Q6H  . losartan  100 mg Oral Daily  . multivitamin with minerals  1 tablet Oral Daily  . rOPINIRole  1 mg Oral QHS  . rosuvastatin  40 mg Oral QPM  . sodium chloride flush  3 mL Intravenous Q12H  . sodium chloride HYPERTONIC  4 mL Nebulization TID   Continuous Infusions: . sodium chloride 250 mL (04/11/18 0926)  . azithromycin Stopped (04/11/18 1135)  . cefTRIAXone (ROCEPHIN)  IV Stopped (04/11/18 1007)   PRN Meds: sodium chloride, acetaminophen, albuterol, ondansetron (ZOFRAN) IV, promethazine, sodium chloride flush, traMADol   Vital Signs    Vitals:   04/11/18 2209 04/12/18 0644 04/12/18 0650 04/12/18 0700  BP: (!) 115/46 (!) 136/53    Pulse: 87 (!) 113    Resp: 20 20    Temp: 98.1 F (36.7 C) 99.1 F (37.3 C)    TempSrc: Oral Oral    SpO2: 92% 91% 94%   Weight:    253 lb 12 oz (115.1 kg)  Height:        Intake/Output Summary (Last 24 hours) at 04/12/2018 0829 Last data filed at 04/12/2018 0307 Gross per 24 hour  Intake 1400.18 ml  Output 602 ml  Net 798.18 ml   Filed Weights   04/10/18 1719 04/11/18 0510 04/12/18 0700    Weight: 258 lb 1.6 oz (117.1 kg) 253 lb 8.5 oz (115 kg) 253 lb 12 oz (115.1 kg)    Telemetry    Sinus tachycardia, HR in 90's to 110's.  - Personally Reviewed  ECG    No new tracings.   Physical Exam   General: Well developed, well nourished Caucasian male appearing in no acute distress. Head: Normocephalic, atraumatic.  Neck: Supple without bruits, JVD not elevated. Lungs:  Resp regular and unlabored, expiratory wheezing throughout. Heart: Regular rhythm, tachycardiac rate, S1, S2, no S3, S4, or murmur; no rub. Abdomen: Soft, non-tender, with normoactive bowel sounds. No hepatomegaly. No rebound/guarding. No obvious abdominal masses. Appears distended.  Extremities: No clubbing, cyanosis, or edema. Distal pedal pulses are 2+ bilaterally. Neuro: Alert and oriented X 3. Moves all extremities spontaneously. Psych: Normal affect.  Labs    Chemistry Recent Labs  Lab 04/10/18 1308 04/11/18 0404 04/12/18 0408  NA 138 140 142  K 3.8 3.8 4.4  CL 99 98 97*  CO2 26 33* 34*  GLUCOSE 150* 197* 181*  BUN 14 14 19   CREATININE 1.02 0.90 1.13  CALCIUM 8.6* 8.8* 9.5  PROT 6.5  --   --  ALBUMIN 3.1*  --   --   AST 23  --   --   ALT 23  --   --   ALKPHOS 85  --   --   BILITOT 0.4  --   --   GFRNONAA >60 >60 >60  GFRAA >60 >60 >60  ANIONGAP 13 9 11      Hematology Recent Labs  Lab 04/10/18 1308 04/10/18 1444 04/11/18 0404 04/12/18 0408  WBC 12.3*  --  11.0* 14.3*  RBC 2.87* 2.89* 2.96* 3.37*  HGB 8.2*  --  8.7* 10.0*  HCT 26.1*  --  27.7* 32.0*  MCV 90.9  --  93.6 95.0  MCH 28.6  --  29.4 29.7  MCHC 31.4  --  31.4 31.3  RDW 13.3  --  13.5 13.6  PLT 478*  --  522* 691*    Cardiac Enzymes Recent Labs  Lab 04/10/18 1308 04/10/18 1557 04/10/18 2143 04/11/18 0404  TROPONINI 0.30* 0.42* 0.43* 0.35*   No results for input(s): TROPIPOC in the last 168 hours.   BNP Recent Labs  Lab 04/10/18 1308  BNP 231.0*     DDimer No results for input(s): DDIMER in the  last 168 hours.   Radiology    Dg Chest 2 View  Result Date: 04/10/2018 CLINICAL DATA:  Productive cough, shortness of breath. EXAM: CHEST - 2 VIEW COMPARISON:  Radiographs of October 14, 2016. FINDINGS: The heart size and mediastinal contours are within normal limits. No pneumothorax is noted. Minimal bilateral pleural effusions are noted. Increased bilateral lung opacities are noted concerning for edema or possibly pneumonia. The visualized skeletal structures are unremarkable. IMPRESSION: Increased bilateral lung opacities are noted concerning for edema or possibly pneumonia. Continued radiographic follow-up is recommended. Electronically Signed   By: Marijo Conception, [name]   On: 04/10/2018 12:21    Cardiac Studies   Echocardiogram: 04/10/2018 Study Conclusions  - Left ventricle: The cavity size was normal. Wall thickness was   normal. Systolic function was normal. The estimated ejection   fraction was in the range of 60% to 65%. Indeterminant diastolic   function. - Aortic valve: Mildly calcified annulus. Mildly thickened   leaflets. - Technically difficult study. Echocontrast was used to enhance   visualization.  Patient Profile     67 y.o. 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   Assessment & Plan    1. Worsening Dyspnea in the setting of Acute Diastolic CHF Exacerbation and known Kartagener's Syndrome - presented with worsening dyspnea and coughing in the setting of subjective fever and chills. WBC elevated to 12.3 on admission and BNP elevated to 231. CXR showed increased bilateral lung opacities concerning for edema versus possible PNA.  - currently on IV Lasix 40mg  BID with a recorded net output of + 798 mL thus far but full I&O's have not been recorded. Weight has  declined by 5 lbs this admission. Creatinine has started to trend upwards from 0.90 to 1.13 and he does not appear significantly volume overloaded by examination. He received IV Lasix 40mg  this AM (will hold PM dose). Will review with Dr. Harl Bowie in regards to switching to PO dosing starting tomorrow.  - has also been started on Azithromycin and Ceftriaxone for empiric coverage for PNA. WBC trending upwards to 14.3. Consider repeat CXR for follow-up given  his continued desaturations. Needs to establish with a Pulmonologist as an outpatient. Would also benefit from a sleep study for suspected OSA.   2. Elevated Troponin - he did report episodes of chest tightness upon admission which was worse with deep inspiration and coughing, lasting for hours at a time. Most consistent with a pleuritic etiology.  - cyclic troponin values have been flat at 0.30, 0.42, 0.43, and 0.35 --> most consistent with demand ischemia in the setting of his acute illness. EKG shows no acute ischemic changes. Echo shows a preserved EF of 60-65% with no regional WMA. Can consider a stress test as an outpatient once respiratory status improves.  - he is tachycardiac by review of telemetry. Suspect this is due to rebound tachycardia in the setting of Albuterol use. Would avoid BB therapy at this time given his wheezing.   3. Chronic Anemia - Hgb at 11.6 in 11/2017, down to 8.2 on admission and trending up to 10.0 this AM. He denies any evidence of active bleeding. - Followed by Hematology as an outpatient.   4. Tobacco Use - he uses a pack of chew tobacco daily. Will order nicotine patch.    For questions or updates, please contact [place] Please consult [email or web address] for contact info under Cardiology/STEMI.   Arna Medici , PA-C 8:29 AM 04/12/2018 Pager: [phone]  Patient seen and discussed with PA Ahmed Prima, I agree with her documentation. Regarding his acute diastolic HF, I/Os are incomplete as  night shift did not document urine output. On lasix 40mg  IV bid, uptrend in Cr and BUN as well as bicarb. He looks to be appearing euvolemia. Heart failure would not explain his ongoing hypoxia, appears to be more of a primary lung issue going on. At bedside cup of thick greenish sputum. Mild troponoin elevation that has trended down in setting of hypoxia, respiratory status would need to be improved to consider ischemic testing. Would not plan for inpatient ischemic testing, reevaluate as outpatient. He has not had typical cardiac chest pain.  Echo with normal LVEF, indeterminant diastolic function.   D/c IV diuretics, start lasix 40mg  daily tomorrow. We will sign off inpatient care   [place] will sign off.   Medication Recommendations:  Lasix 40mg  daily starting tomorrow Other recommendations (labs, testing, etc):  BMET/Mg in 2 weeks.  Follow up as an outpatient:  2-3 weeks outpatient f/u. Will need BMET/Mg evaluated then, reevaluate for possible stress test.     Carlyle Dolly MD

## 2018-04-13 DIAGNOSIS — Q893 Situs inversus: Secondary | ICD-10-CM

## 2018-04-13 DIAGNOSIS — D500 Iron deficiency anemia secondary to blood loss (chronic): Secondary | ICD-10-CM

## 2018-04-13 LAB — CBC
HEMATOCRIT: 27.3 % — AB (ref 39.0–52.0)
Hemoglobin: 8.5 g/dL — ABNORMAL LOW (ref 13.0–17.0)
MCH: 29.1 pg (ref 26.0–34.0)
MCHC: 31.1 g/dL (ref 30.0–36.0)
MCV: 93.5 fL (ref 78.0–100.0)
PLATELETS: 643 10*3/uL — AB (ref 150–400)
RBC: 2.92 MIL/uL — ABNORMAL LOW (ref 4.22–5.81)
RDW: 13.6 % (ref 11.5–15.5)
WBC: 12.1 10*3/uL — AB (ref 4.0–10.5)

## 2018-04-13 LAB — GLUCOSE, CAPILLARY
GLUCOSE-CAPILLARY: 168 mg/dL — AB (ref 70–99)
GLUCOSE-CAPILLARY: 143 mg/dL — AB (ref 70–99)
Glucose-Capillary: 154 mg/dL — ABNORMAL HIGH (ref 70–99)
Glucose-Capillary: 283 mg/dL — ABNORMAL HIGH (ref 70–99)

## 2018-04-13 LAB — BASIC METABOLIC PANEL
Anion gap: 10 (ref 5–15)
BUN: 17 mg/dL (ref 8–23)
CHLORIDE: 94 mmol/L — AB (ref 98–111)
CO2: 33 mmol/L — ABNORMAL HIGH (ref 22–32)
CREATININE: 0.89 mg/dL (ref 0.61–1.24)
Calcium: 8.8 mg/dL — ABNORMAL LOW (ref 8.9–10.3)
GFR calc Af Amer: >60 mL/min (ref >60)
GLUCOSE: 180 mg/dL — AB (ref 70–99)
POTASSIUM: 4.2 mmol/L (ref 3.5–5.1)
SODIUM: 137 mmol/L (ref 135–145)

## 2018-04-13 MED ORDER — METOPROLOL TARTRATE 5 MG/5ML IV SOLN
5.0000 mg | INTRAVENOUS | Status: AC | PRN
  Administered 2018-04-13 (×2): 5 mg via INTRAVENOUS
  Filled 2018-04-13 (×2): qty 5

## 2018-04-13 MED ORDER — FUROSEMIDE 20 MG PO TABS
20.0000 mg | ORAL_TABLET | Freq: Every day | ORAL | Status: DC
  Administered 2018-04-13 – 2018-04-15 (×3): 20 mg via ORAL
  Filled 2018-04-13 (×3): qty 1

## 2018-04-13 MED ORDER — SODIUM CHLORIDE 0.9 % IV SOLN
510.0000 mg | Freq: Once | INTRAVENOUS | Status: AC
  Administered 2018-04-13: 510 mg via INTRAVENOUS
  Filled 2018-04-13: qty 17

## 2018-04-13 MED ORDER — FERUMOXYTOL INJECTION 510 MG/17 ML
INTRAVENOUS | Status: AC
  Filled 2018-04-13: qty 17

## 2018-04-13 MED ORDER — SODIUM CHLORIDE 0.9 % IV SOLN
INTRAVENOUS | Status: DC | PRN
  Administered 2018-04-13 – 2018-04-14 (×2): via INTRAVENOUS

## 2018-04-13 NOTE — Progress Notes (Signed)
PROGRESS NOTE  Brett [name] BHA:[id number] DOB: 1951/01/02 DOA: 04/10/2018 PCP: [name], MD  Brief History:  67 y.o.malewith medical history significantof depression, diabetes, hypertension, kartagener's syndrome, presented withshortness of breath, fever, chillsfor several days, found to be hypoxic in ED. Admitted for acute diastolic CHF.  Assessment/Plan: Acute hypoxic respiratory failure -secondary to community-acquired pneumonia  and acute pulmonary edema from acute diastolic CHF -Independently reviewed chest x-ray done on 04/10/2018 which revealed dextrocardia and bilateral pulmonary infiltrates -Continue azithromycin and IV ceftriaxone -Obtain CBC in the morning -Continue nebs -pulmonary toilet with hypersaline nebs 3 times daily O2 supplementation to maintain O2 saturation between 88 and 92% -Will need pulmonology follow-up outpatient due to underlying condition with Kartagener syndrome -suspect underlying COPD--will need outpt PFTs--has had significant second hand smoke exposure  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 -now euvolemic -7/30 echo--EF 60-65%, indeterminant diastolic function -IV lasix to po lasix 20 mg daily  Elevated troponin  -due to demand ischemia -Cardiology consulted and following -Asymptomatic -Denies chest pain -Troponin peaked at 0.43 and trended down Last 2D echo done on 04/10/2018 revealed normal LVEF with normal wall motion -Twelve-lead EKG done on admission independently reviewed revealed sinus rhythm with rate of 94 with no specific ST-T changes  Kartagener syndrome -Will need to follow-up with pulmonology outpatient  Unspecified thrombocytosis 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 -due to combination of iron deficiency and acute medical illness -iron saturation 3%, ferritin 160 -Feraheme x 1  Chronic normocytic anemia Negative FOBT No sign of overt bleeding Repeat CBC in the morning  Obesity Recommend weight loss  outpatient Regular physical activity and healthy dieting -BMI 39.75     Disposition Plan:   Home 8/3 if stable Family Communication:   Spouse updated at bedside 8/2  Consultants:  cardiology  Code Status:  FULL   DVT Prophylaxis:  [place] Lovenox   Procedures: As Listed in Progress Note Above  Antibiotics: None    Subjective: Patient denies fevers, chills, headache, chest pain, dyspnea, nausea, vomiting, diarrhea, abdominal pain, dysuria, hematuria, hematochezia, and melena.   Objective: Vitals:   04/13/18 0559 04/13/18 0804 04/13/18 1322 04/13/18 1347  BP:   (!) 124/55   Pulse:   85   Resp:   20   Temp:   98 F (36.7 C)   TempSrc:   Oral   SpO2:  91% 99% 94%  Weight: 115.1 kg (253 lb 12.3 oz)     Height:        Intake/Output Summary (Last 24 hours) at 04/13/2018 1756 Last data filed at 04/13/2018 0942 Gross per 24 hour  Intake 110 ml  Output 1800 ml  Net -1690 ml   Weight change: 0.01 kg (0.4 oz) Exam:   General:  Pt is alert, follows commands appropriately, not in acute distress  HEENT: No icterus, No thrush, No neck mass, [place]/AT  Cardiovascular: RRR, S1/S2, no rubs, no gallops  Respiratory: CTA bilaterally, no wheezing, no crackles, no rhonchi  Abdomen: Soft/+BS, non tender, non distended, no guarding  Extremities: No edema, No lymphangitis, No petechiae, No rashes, no synovitis   Data Reviewed: I have personally reviewed following labs and imaging studies Basic Metabolic Panel: Recent Labs  Lab 04/10/18 1308 04/11/18 0404 04/12/18 0408 04/13/18 0446  NA 138 140 142 137  K 3.8 3.8 4.4 4.2  CL 99 98 97* 94*  CO2 26 33* 34* 33*  GLUCOSE 150* 197* 181* 180*  BUN 14 14 19 17   CREATININE  1.02 0.90 1.13 0.89  CALCIUM 8.6* 8.8* 9.5 8.8*   Liver Function Tests: Recent Labs  Lab 04/10/18 1308  AST 23  ALT 23  ALKPHOS 85  BILITOT 0.4  PROT 6.5  ALBUMIN 3.1*   No results for input(s): LIPASE, AMYLASE in the last 168 hours. No results for  input(s): AMMONIA in the last 168 hours. Coagulation Profile: No results for input(s): INR, PROTIME in the last 168 hours. CBC: Recent Labs  Lab 04/10/18 1308 04/11/18 0404 04/12/18 0408 04/13/18 0446  WBC 12.3* 11.0* 14.3* 12.1*  NEUTROABS 9.6*  --   --   --   HGB 8.2* 8.7* 10.0* 8.5*  HCT 26.1* 27.7* 32.0* 27.3*  MCV 90.9 93.6 95.0 93.5  PLT 478* 522* 691* 643*   Cardiac Enzymes: Recent Labs  Lab 04/10/18 1308 04/10/18 1557 04/10/18 2143 04/11/18 0404  TROPONINI 0.30* 0.42* 0.43* 0.35*   BNP: Invalid input(s): POCBNP CBG: Recent Labs  Lab 04/12/18 1632 04/12/18 2155 04/13/18 0820 04/13/18 1132 04/13/18 1618  GLUCAP 338* 185* 154* 283* 168*   HbA1C: No results for input(s): HGBA1C in the last 72 hours. Urine analysis:    Component Value Date/Time   COLORURINE YELLOW 05/13/2015 1440   APPEARANCEUR CLEAR 05/13/2015 1440   LABSPEC <1.005 (L) 05/13/2015 1440   PHURINE 5.5 05/13/2015 1440   GLUCOSEU >1000 (A) 05/13/2015 1440   HGBUR NEGATIVE 05/13/2015 1440   BILIRUBINUR NEGATIVE 05/13/2015 1440   KETONESUR NEGATIVE 05/13/2015 1440   PROTEINUR NEGATIVE 05/13/2015 1440   UROBILINOGEN 0.2 05/13/2015 1440   NITRITE NEGATIVE 05/13/2015 1440   LEUKOCYTESUR NEGATIVE 05/13/2015 1440   Sepsis Labs: @LABRCNTIP (procalcitonin:4,lacticidven:4) ) Recent Results (from the past 240 hour(s))  Blood culture (routine x 2)     Status: None (Preliminary result)   Collection Time: 04/10/18  1:08 PM  Result Value Ref Range Status   Specimen Description BLOOD LEFT HAND DRAWN BY RN  Final   Special Requests   Final    BOTTLES DRAWN AEROBIC AND ANAEROBIC Blood Culture adequate volume   Culture   Final    NO GROWTH 3 DAYS Performed at [facility], [address] [place], [place] [address]    Report Status PENDING  Incomplete  Blood culture (routine x 2)     Status: None (Preliminary result)   Collection Time: 04/10/18  1:27 PM  Result Value Ref Range Status   Specimen  Description BLOOD RIGHT HAND  Final   Special Requests   Final    BOTTLES DRAWN AEROBIC AND ANAEROBIC Blood Culture adequate volume   Culture   Final    NO GROWTH 3 DAYS Performed at [facility], [address] [place],  [address]    Report Status PENDING  Incomplete  Respiratory Panel by PCR     Status: None   Collection Time: 04/11/18  8:23 AM  Result Value Ref Range Status   Adenovirus NOT DETECTED NOT DETECTED Final   Coronavirus 229E NOT DETECTED NOT DETECTED Final   Coronavirus HKU1 NOT DETECTED NOT DETECTED Final   Coronavirus NL63 NOT DETECTED NOT DETECTED Final   Coronavirus OC43 NOT DETECTED NOT DETECTED Final   Metapneumovirus NOT DETECTED NOT DETECTED Final   Rhinovirus / Enterovirus NOT DETECTED NOT DETECTED Final   Influenza A NOT DETECTED NOT DETECTED Final   Influenza B NOT DETECTED NOT DETECTED Final   Parainfluenza Virus 1 NOT DETECTED NOT DETECTED Final   Parainfluenza Virus 2 NOT DETECTED NOT DETECTED Final   Parainfluenza Virus 3 NOT  DETECTED NOT DETECTED Final   Parainfluenza Virus 4 NOT DETECTED NOT DETECTED Final   Respiratory Syncytial Virus NOT DETECTED NOT DETECTED Final   Bordetella pertussis NOT DETECTED NOT DETECTED Final   Chlamydophila pneumoniae NOT DETECTED NOT DETECTED Final   Mycoplasma pneumoniae NOT DETECTED NOT DETECTED Final    Comment: Performed at [facility], [place] [address], [place], [place] [address]     Scheduled Meds: . aspirin EC  81 mg Oral Daily  . azelastine  2 spray Each Nare BID  . azithromycin  500 mg Oral Daily  . DULoxetine  60 mg Oral Daily  . enoxaparin (LOVENOX) injection  40 mg Subcutaneous Q24H  . folic acid  1 mg Oral Daily  . furosemide  20 mg Oral Daily  . gabapentin  600 mg Oral TID  . insulin aspart  0-9 Units Subcutaneous TID WC  . insulin detemir  30 Units Subcutaneous BID  . ipratropium-albuterol  3 mL Nebulization Q6H  . losartan  100 mg Oral Daily  . multivitamin with minerals  1 tablet  Oral Daily  . nicotine  7 mg Transdermal Daily  . rOPINIRole  1 mg Oral QHS  . rosuvastatin  40 mg Oral QPM  . sodium chloride flush  3 mL Intravenous Q12H  . sodium chloride HYPERTONIC  4 mL Nebulization TID   Continuous Infusions: . sodium chloride 250 mL (04/11/18 0926)  . sodium chloride Stopped (04/13/18 0942)  . cefTRIAXone (ROCEPHIN)  IV Stopped (04/13/18 0908)    Procedures/Studies: Dg Chest 2 View  Result Date: 04/10/2018 CLINICAL DATA:  Productive cough, shortness of breath. EXAM: CHEST - 2 VIEW COMPARISON:  Radiographs of October 14, 2016. FINDINGS: The heart size and mediastinal contours are within normal limits. No pneumothorax is noted. Minimal bilateral pleural effusions are noted. Increased bilateral lung opacities are noted concerning for edema or possibly pneumonia. The visualized skeletal structures are unremarkable. IMPRESSION: Increased bilateral lung opacities are noted concerning for edema or possibly pneumonia. Continued radiographic follow-up is recommended. Electronically Signed   By: Marijo Conception, [name]   On: 04/10/2018 12:21    Orson Eva, DO  [facility] Pager [phone]  If 7PM-7AM, please contact night-coverage [email or web address] Password [email or web address] 04/13/2018, 5:56 PM   LOS: 3 days

## 2018-04-13 NOTE — Progress Notes (Signed)
Notified by telemetry that patients HR was 177. When assessing patient, he noted to have sat up on side of bed and stated that he was turning from side to side and coughing. HR 140 at this time, 2150. Denies pain. Notified MD to make aware who stated to monitor patient after rest and notify if HR stays above 120. HR sustained in the 140's, new order given for metoprolol 5mg . HR 70's within the hr. Will continue to monitor.

## 2018-04-13 NOTE — Progress Notes (Addendum)
Inpatient Diabetes Program Recommendations  AACE/ADA: New Consensus Statement on Inpatient Glycemic Control (2015)  Target Ranges:  Prepandial:   less than 140 mg/dL      Peak postprandial:   less than 180 mg/dL (1-2 hours)      Critically ill patients:  140 - 180 mg/dL   Results for Brett [name], Brett [name] (MRN [id number]) as of 04/13/2018 11:29  Ref. Range 04/11/2018 22:06 04/12/2018 07:55 04/12/2018 10:57 04/12/2018 16:32 04/12/2018 21:55  Glucose-Capillary Latest Ref Range: 70 - 99 mg/dL 144 (H) 160 (H) 211 (H) 338 (H) 185 (H)    Ref. Range 04/13/2018 08:20 04/13/2018 11:32  Glucose-Capillary Latest Ref Range: 70 - 99 mg/dL 154 (H) 283 (H)    Review of Glycemic Control  Diabetes history: Type 2 DM  Outpatient Diabetes medications: Levemir 30 units am and 60 units pm                                                        Metformin 1000 mg BID                                                        Actos 45 mg daily                                                        Januvia 100mg  daily  Current orders for Inpatient glycemic control: Levemir 30 units BID                                                                          Novolog sensitive correction scale (0-9 units TID)  Inpatient Diabetes Program Recommendations:                                              Patient eating 90-100% of meals.  Please consider adding Novolog meal coverage : Novolog 4 units TID with meals.  (Please add the following hold parameters:  Hold if NPO , Hold if patient eats less than 50% of meal.)   -- Will follow during hospitalization.--  Jonna Clark RN, MSN Diabetes Coordinator Inpatient Glycemic Control Team Team Pager: [phone] (8am-5pm)

## 2018-04-13 NOTE — Care Management Note (Addendum)
Case Management Note  Patient Details  Name: Brett [name] MRN: [id number] Date of Birth: [birth year]  Subjective/Objective:  CHF, Pneumonia. From home, independent. Goes to [facility] for primary care. Home O2 eval done, patient will need oxygen. Requests [employer].          Action/Plan: Referral sent on 04/12/2018 to [facility].   ADDENDUM: 1340 : [facility] plans to deliver portable oxygen tank to room today and concentrator to home tomorrow (wife to be home and receive).  Anticipate weekend DC.   Expected Discharge Date:    04/14/2018              Expected Discharge Plan:  Home/Self Care  In-House Referral:     Discharge planning Services  CM Consult  Post Acute Care Choice:  Durable Medical Equipment Choice offered to:  Patient  DME Arranged:  Oxygen, Pulse oximeter DME Agency:  [facility]  HH Arranged:    [place] Agency:     Status of Service:  Completed, signed off  If discussed at [employer], dates discussed:    Additional Comments:  Yamilee Harmes, Chauncey [place], RN 04/13/2018, 9:21 AM

## 2018-04-14 DIAGNOSIS — I471 Supraventricular tachycardia, unspecified: Secondary | ICD-10-CM

## 2018-04-14 DIAGNOSIS — I5032 Chronic diastolic (congestive) heart failure: Secondary | ICD-10-CM

## 2018-04-14 LAB — CBC
HCT: 31.5 % — ABNORMAL LOW (ref 39.0–52.0)
Hemoglobin: 9.6 g/dL — ABNORMAL LOW (ref 13.0–17.0)
MCH: 28.7 pg (ref 26.0–34.0)
MCHC: 30.5 g/dL (ref 30.0–36.0)
MCV: 94 fL (ref 78.0–100.0)
Platelets: 696 10*3/uL — ABNORMAL HIGH (ref 150–400)
RBC: 3.35 MIL/uL — ABNORMAL LOW (ref 4.22–5.81)
RDW: 13.5 % (ref 11.5–15.5)
WBC: 14.1 10*3/uL — ABNORMAL HIGH (ref 4.0–10.5)

## 2018-04-14 LAB — BASIC METABOLIC PANEL
ANION GAP: 8 (ref 5–15)
BUN: 15 mg/dL (ref 8–23)
CO2: 34 mmol/L — ABNORMAL HIGH (ref 22–32)
Calcium: 9.5 mg/dL (ref 8.9–10.3)
Chloride: 98 mmol/L (ref 98–111)
Creatinine, Ser: 0.86 mg/dL (ref 0.61–1.24)
GFR calc non Af Amer: >60 mL/min (ref >60)
Glucose, Bld: 105 mg/dL — ABNORMAL HIGH (ref 70–99)
Potassium: 4 mmol/L (ref 3.5–5.1)
Sodium: 140 mmol/L (ref 135–145)

## 2018-04-14 LAB — GLUCOSE, CAPILLARY
GLUCOSE-CAPILLARY: 85 mg/dL (ref 70–99)
GLUCOSE-CAPILLARY: 113 mg/dL — AB (ref 70–99)
Glucose-Capillary: 160 mg/dL — ABNORMAL HIGH (ref 70–99)
Glucose-Capillary: 136 mg/dL — ABNORMAL HIGH (ref 70–99)

## 2018-04-14 MED ORDER — MAGNESIUM SULFATE 2 GM/50ML IV SOLN
2.0000 g | Freq: Once | INTRAVENOUS | Status: AC
  Administered 2018-04-14: 2 g via INTRAVENOUS
  Filled 2018-04-14: qty 50

## 2018-04-14 MED ORDER — LOSARTAN POTASSIUM 50 MG PO TABS
50.0000 mg | ORAL_TABLET | Freq: Every day | ORAL | Status: DC
  Administered 2018-04-15: 50 mg via ORAL
  Filled 2018-04-14: qty 1

## 2018-04-14 MED ORDER — METOPROLOL TARTRATE 5 MG/5ML IV SOLN
5.0000 mg | Freq: Once | INTRAVENOUS | Status: AC
  Administered 2018-04-14: 5 mg via INTRAVENOUS
  Filled 2018-04-14: qty 5

## 2018-04-14 MED ORDER — LORAZEPAM 2 MG/ML IJ SOLN
0.7500 mg | Freq: Once | INTRAMUSCULAR | Status: AC
  Administered 2018-04-14: 0.75 mg via INTRAVENOUS
  Filled 2018-04-14: qty 1

## 2018-04-14 MED ORDER — METOPROLOL TARTRATE 25 MG PO TABS
12.5000 mg | ORAL_TABLET | Freq: Two times a day (BID) | ORAL | Status: DC
  Administered 2018-04-14 – 2018-04-15 (×3): 12.5 mg via ORAL
  Filled 2018-04-14 (×3): qty 1

## 2018-04-14 NOTE — Progress Notes (Addendum)
Patient found to be having episodes of increased HR with rates between 135-155. MD informed orders received and cared out at [phone]

## 2018-04-14 NOTE — Progress Notes (Signed)
Night shift telemetry coverage note.  The patient was seen due to supraventricular tachycardia in the 130s and 140s.  He denied chest pain, dyspnea, palpitations, dizziness, nausea or diaphoresis.  Earlier in the shift he was given metoprolol 5 mg IVP with minimal results.  Heart rate decreased to the 110s and 120s, but then later rose to the 130s and 140s again.  Chart, EKG and labs were reviewed.  His most recent vital sign respirations 18, blood pressure 139/74 and O2 sat 97% on nasal cannula oxygen.  General no acute distress. Head normocephalic. Neck supple, no JVD. Lungs CTA. Cardiovascular S1-S2, tachycardic in the 130s and 140s with a regular rhythm, no murmurs Extremities no edema, clubbing or cyanosis. Neuro awake alert oriented x3.  Assessment/plan:  SVT. The patient had a 7 mg nicotine patch, but he was also chewing an unknown amount of tobacco.  He was asked to avoid chewing tobacco at all, particularly while in the hospital.  The nicotine patch was removed. He responded to metoprolol 5 mg IVP second dose, lorazepam 0.75 mg IVP x1 and magnesium sulfate 2 g IVPB.  He is currently sleeping and his heart rate is in the 60s.  About 35 minutes of critical care time were spent during the process of this origin event.  This document was prepared using Dragon voice recognition software and may contain some unintended transcription errors.

## 2018-04-14 NOTE — Progress Notes (Addendum)
PROGRESS NOTE  Brett [name] HER:[id number] DOB: Mar 12, 1951 DOA: 04/10/2018 PCP: [name], MD Brief History:  67 y.o.malewith medical history significantof depression, diabetes, hypertension, kartagener's syndrome, presented withshortness of breath, fever, chillsfor several days, found to be hypoxic in ED. Admitted for acute diastolic CHF.  Assessment/Plan: Acute hypoxic respiratory failure -secondary to community-acquired pneumonia  and acute pulmonary edema from acute diastolic CHF -Independently reviewed chest x-ray done on 04/10/2018 which revealed dextrocardia and bilateral pulmonary infiltrates -Continue azithromycin and IV ceftriaxone -Obtain CBC in the morning -Continue nebs -pulmonary toilet with hypersaline nebs 3 times daily O2 supplementation to maintain O2 saturation between 88 and 92% -Will need pulmonology follow-up outpatient due to underlying condition withKartagener syndrome -suspect underlying COPD--will need outpt PFTs--has had significant second hand smoke exposure -ambulatory pulse showed desaturation-->d/c home with 3L [place] -will need outpt sleep study and PFTs  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 -now euvolemic -7/30 echo--EF 60-65%, indeterminant diastolic function -IV lasix to po lasix 20 mg daily -daily weights  SVT -pt developed SVT 140s early am 04/14/18 -required IV lopressor x 2-->now back to sinus -start metoprolol 12.5 mg bid -pt still chewing tobacco despite having nicotine patch--d/c patch -in part due to acute medical illness -personally reviewed EKG--SVT, nonspecific Twave changes -am BMP  Elevated troponin  -due to demand ischemia -Cardiology consulted and following -Asymptomatic -Denies chest pain -Troponin peaked at 0.43and trended down Last 2D echo done on 04/10/2018 revealed normal LVEF with normal wall motion -Twelve-lead EKG done on admission independently reviewed revealed sinus rhythm with rate of 94 with no specific  ST-T changes  Kartagener syndrome -Will need to follow-up with pulmonologyoutpatient  Unspecified thrombocytosis 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 -due to combination of iron deficiency and acute medical illness -iron saturation 3%, ferritin 160 -Feraheme x 1 given 8/2  Chronic normocytic anemia Negative FOBT No sign of overt bleeding Repeat CBC in the morning  Obesity Recommend weight loss outpatient Regular physical activity and healthy dieting -BMI 39.75     Disposition Plan:   Home 8/4 if stable Family Communication:   Spouse updated at bedside 8/3  Consultants:  cardiology  Code Status:  FULL   DVT Prophylaxis:  [place] Lovenox   Procedures: As Listed in Progress Note Above  Antibiotics: None    Subjective: Pt still has some dyspnea with exertion, but improving.  Denies cp, sob, n/v/d.  Pt was walking to [place] when had SVT early this am.  He was asymptomatic  Objective: Vitals:   04/13/18 2347 04/14/18 0601 04/14/18 0717 04/14/18 0728  BP: 139/74 127/63    Pulse: (!) 140 65    Resp: 18 20    Temp:  98.3 F (36.8 C)    TempSrc:  Oral    SpO2: 97% 99% (S) (!) 87% 99%  Weight:  116.5 kg (256 lb 13.4 oz)    Height:        Intake/Output Summary (Last 24 hours) at 04/14/2018 1051 Last data filed at 04/14/2018 0300 Gross per 24 hour  Intake 1458 ml  Output 1700 ml  Net -242 ml   Weight change: 1.39 kg (3 lb 1 oz) Exam:   General:  Pt is alert, follows commands appropriately, not in acute distress  HEENT: No icterus, No thrush, No neck mass, [place]/AT  Cardiovascular: RRR, S1/S2, no rubs, no gallops  Respiratory: diminished BS, no wheeze  Abdomen: Soft/+BS, non tender, non distended, no guarding  Extremities: trace LE edema, No  lymphangitis, No petechiae, No rashes, no synovitis   Data Reviewed: I have personally reviewed following labs and imaging studies Basic Metabolic Panel: Recent Labs  Lab 04/10/18 1308 04/11/18 0404  04/12/18 0408 04/13/18 0446 04/14/18 0551  NA 138 140 142 137 140  K 3.8 3.8 4.4 4.2 4.0  CL 99 98 97* 94* 98  CO2 26 33* 34* 33* 34*  GLUCOSE 150* 197* 181* 180* 105*  BUN 14 14 19 17 15   CREATININE 1.02 0.90 1.13 0.89 0.86  CALCIUM 8.6* 8.8* 9.5 8.8* 9.5   Liver Function Tests: Recent Labs  Lab 04/10/18 1308  AST 23  ALT 23  ALKPHOS 85  BILITOT 0.4  PROT 6.5  ALBUMIN 3.1*   No results for input(s): LIPASE, AMYLASE in the last 168 hours. No results for input(s): AMMONIA in the last 168 hours. Coagulation Profile: No results for input(s): INR, PROTIME in the last 168 hours. CBC: Recent Labs  Lab 04/10/18 1308 04/11/18 0404 04/12/18 0408 04/13/18 0446 04/14/18 0551  WBC 12.3* 11.0* 14.3* 12.1* 14.1*  NEUTROABS 9.6*  --   --   --   --   HGB 8.2* 8.7* 10.0* 8.5* 9.6*  HCT 26.1* 27.7* 32.0* 27.3* 31.5*  MCV 90.9 93.6 95.0 93.5 94.0  PLT 478* 522* 691* 643* 696*   Cardiac Enzymes: Recent Labs  Lab 04/10/18 1308 04/10/18 1557 04/10/18 2143 04/11/18 0404  TROPONINI 0.30* 0.42* 0.43* 0.35*   BNP: Invalid input(s): POCBNP CBG: Recent Labs  Lab 04/13/18 0820 04/13/18 1132 04/13/18 1618 04/13/18 2120 04/14/18 0759  GLUCAP 154* 283* 168* 143* 85   HbA1C: No results for input(s): HGBA1C in the last 72 hours. Urine analysis:    Component Value Date/Time   COLORURINE YELLOW 05/13/2015 1440   APPEARANCEUR CLEAR 05/13/2015 1440   LABSPEC <1.005 (L) 05/13/2015 1440   PHURINE 5.5 05/13/2015 1440   GLUCOSEU >1000 (A) 05/13/2015 1440   HGBUR NEGATIVE 05/13/2015 1440   BILIRUBINUR NEGATIVE 05/13/2015 1440   KETONESUR NEGATIVE 05/13/2015 1440   PROTEINUR NEGATIVE 05/13/2015 1440   UROBILINOGEN 0.2 05/13/2015 1440   NITRITE NEGATIVE 05/13/2015 1440   LEUKOCYTESUR NEGATIVE 05/13/2015 1440   Sepsis Labs: @LABRCNTIP (procalcitonin:4,lacticidven:4) ) Recent Results (from the past 240 hour(s))  Blood culture (routine x 2)     Status: None (Preliminary result)    Collection Time: 04/10/18  1:08 PM  Result Value Ref Range Status   Specimen Description BLOOD LEFT HAND DRAWN BY RN  Final   Special Requests   Final    BOTTLES DRAWN AEROBIC AND ANAEROBIC Blood Culture adequate volume   Culture   Final    NO GROWTH 4 DAYS Performed at [facility], [address] [place], [place] [address]    Report Status PENDING  Incomplete  Blood culture (routine x 2)     Status: None (Preliminary result)   Collection Time: 04/10/18  1:27 PM  Result Value Ref Range Status   Specimen Description BLOOD RIGHT HAND  Final   Special Requests   Final    BOTTLES DRAWN AEROBIC AND ANAEROBIC Blood Culture adequate volume   Culture   Final    NO GROWTH 4 DAYS Performed at [facility], [address], [place], [place] [address]    Report Status PENDING  Incomplete  Respiratory Panel by PCR     Status: None   Collection Time: 04/11/18  8:23 AM  Result Value Ref Range Status   Adenovirus NOT DETECTED NOT DETECTED Final   Coronavirus 229E NOT  DETECTED NOT DETECTED Final   Coronavirus HKU1 NOT DETECTED NOT DETECTED Final   Coronavirus NL63 NOT DETECTED NOT DETECTED Final   Coronavirus OC43 NOT DETECTED NOT DETECTED Final   Metapneumovirus NOT DETECTED NOT DETECTED Final   Rhinovirus / Enterovirus NOT DETECTED NOT DETECTED Final   Influenza A NOT DETECTED NOT DETECTED Final   Influenza B NOT DETECTED NOT DETECTED Final   Parainfluenza Virus 1 NOT DETECTED NOT DETECTED Final   Parainfluenza Virus 2 NOT DETECTED NOT DETECTED Final   Parainfluenza Virus 3 NOT DETECTED NOT DETECTED Final   Parainfluenza Virus 4 NOT DETECTED NOT DETECTED Final   Respiratory Syncytial Virus NOT DETECTED NOT DETECTED Final   Bordetella pertussis NOT DETECTED NOT DETECTED Final   Chlamydophila pneumoniae NOT DETECTED NOT DETECTED Final   Mycoplasma pneumoniae NOT DETECTED NOT DETECTED Final    Comment: Performed at [facility], [place] [address] [place], [place] [address]      Scheduled Meds: . aspirin EC  81 mg Oral Daily  . azelastine  2 spray Each Nare BID  . azithromycin  500 mg Oral Daily  . DULoxetine  60 mg Oral Daily  . enoxaparin (LOVENOX) injection  40 mg Subcutaneous Q24H  . folic acid  1 mg Oral Daily  . furosemide  20 mg Oral Daily  . gabapentin  600 mg Oral TID  . insulin aspart  0-9 Units Subcutaneous TID WC  . insulin detemir  30 Units Subcutaneous BID  . ipratropium-albuterol  3 mL Nebulization Q6H  . [START ON 04/15/2018] losartan  50 mg Oral Daily  . metoprolol tartrate  12.5 mg Oral BID  . multivitamin with minerals  1 tablet Oral Daily  . rOPINIRole  1 mg Oral QHS  . rosuvastatin  40 mg Oral QPM  . sodium chloride flush  3 mL Intravenous Q12H  . sodium chloride HYPERTONIC  4 mL Nebulization TID   Continuous Infusions: . sodium chloride 250 mL (04/11/18 0926)  . sodium chloride 10 mL/hr at 04/14/18 0238  . cefTRIAXone (ROCEPHIN)  IV Stopped (04/14/18 0830)    Procedures/Studies: Dg Chest 2 View  Result Date: 04/10/2018 CLINICAL DATA:  Productive cough, shortness of breath. EXAM: CHEST - 2 VIEW COMPARISON:  Radiographs of October 14, 2016. FINDINGS: The heart size and mediastinal contours are within normal limits. No pneumothorax is noted. Minimal bilateral pleural effusions are noted. Increased bilateral lung opacities are noted concerning for edema or possibly pneumonia. The visualized skeletal structures are unremarkable. IMPRESSION: Increased bilateral lung opacities are noted concerning for edema or possibly pneumonia. Continued radiographic follow-up is recommended. Electronically Signed   By: Marijo Conception, [name]   On: 04/10/2018 12:21    Orson Eva, DO  [facility] Pager [phone]  If 7PM-7AM, please contact night-coverage [email or web address] Password [email or web address] 04/14/2018, 10:51 AM   LOS: 4 days

## 2018-04-15 DIAGNOSIS — Z794 Long term (current) use of insulin: Secondary | ICD-10-CM

## 2018-04-15 DIAGNOSIS — E119 Type 2 diabetes mellitus without complications: Secondary | ICD-10-CM

## 2018-04-15 DIAGNOSIS — I5033 Acute on chronic diastolic (congestive) heart failure: Secondary | ICD-10-CM

## 2018-04-15 DIAGNOSIS — I471 Supraventricular tachycardia: Secondary | ICD-10-CM

## 2018-04-15 LAB — BASIC METABOLIC PANEL
Anion gap: 7 (ref 5–15)
BUN: 14 mg/dL (ref 8–23)
CALCIUM: 9.8 mg/dL (ref 8.9–10.3)
CO2: 34 mmol/L — AB (ref 22–32)
Chloride: 100 mmol/L (ref 98–111)
Creatinine, Ser: 0.81 mg/dL (ref 0.61–1.24)
GFR calc Af Amer: >60 mL/min (ref >60)
GFR calc non Af Amer: >60 mL/min (ref >60)
Glucose, Bld: 76 mg/dL (ref 70–99)
Potassium: 4.4 mmol/L (ref 3.5–5.1)
SODIUM: 141 mmol/L (ref 135–145)

## 2018-04-15 LAB — CBC
HCT: 32.2 % — ABNORMAL LOW (ref 39.0–52.0)
Hemoglobin: 9.8 g/dL — ABNORMAL LOW (ref 13.0–17.0)
MCH: 28.7 pg (ref 26.0–34.0)
MCHC: 30.4 g/dL (ref 30.0–36.0)
MCV: 94.4 fL (ref 78.0–100.0)
Platelets: 730 10*3/uL — ABNORMAL HIGH (ref 150–400)
RBC: 3.41 MIL/uL — AB (ref 4.22–5.81)
RDW: 13.6 % (ref 11.5–15.5)
WBC: 12.9 10*3/uL — AB (ref 4.0–10.5)

## 2018-04-15 LAB — GLUCOSE, CAPILLARY
Glucose-Capillary: 67 mg/dL — ABNORMAL LOW (ref 70–99)
Glucose-Capillary: 134 mg/dL — ABNORMAL HIGH (ref 70–99)
Glucose-Capillary: 144 mg/dL — ABNORMAL HIGH (ref 70–99)

## 2018-04-15 LAB — CULTURE, BLOOD (ROUTINE X 2)
Culture: NO GROWTH
Culture: NO GROWTH
SPECIAL REQUESTS: ADEQUATE
Special Requests: ADEQUATE

## 2018-04-15 LAB — MAGNESIUM: Magnesium: 2.3 mg/dL (ref 1.7–2.4)

## 2018-04-15 MED ORDER — FUROSEMIDE 20 MG PO TABS: 20.0000 mg | ORAL_TABLET | Freq: Every day | ORAL | 1 refills | Status: DC

## 2018-04-15 MED ORDER — METOPROLOL TARTRATE 25 MG PO TABS: 12.5000 mg | ORAL_TABLET | Freq: Two times a day (BID) | ORAL | 1 refills | Status: DC

## 2018-04-15 NOTE — Progress Notes (Signed)
Patient is to be discharged home and in stable condition. Patient's IV and telemetry removed, WNL. Patient and wife given discharge instructions and verbalized understanding. Patient's home oxygen at bedside and patient demonstrates understanding of how to utilize the portable oxygen tank. Patient to be escorted out by staff via wheelchair.  Celestia Khat, RN

## 2018-04-15 NOTE — Discharge Summary (Signed)
Physician Discharge Summary  Brett [name] LZJ:[id number] DOB: 10-08-1950 DOA: 04/10/2018  PCP: [name], MD  Admit date: 04/10/2018 Discharge date: 04/15/2018  Admitted From: Home Disposition:  Home  Recommendations for Outpatient Follow-up:  1. Follow up with PCP in 1-2 weeks 2. Please obtain BMP/CBC in one week 3. Wean oxygen as tolerated for saturation >92%    Discharge Condition: Stable CODE STATUS: FULL Diet recommendation: Heart Healthy / Carb Modified   Brief/Interim Summary: 67 y.o.malewith medical history significantof depression, diabetes, hypertension, kartagener's syndrome, presented withshortness of breath, fever, chillsfor several days, found to be hypoxic in ED. Admitted for acute diastolic CHF and pneumonia.    Discharge Diagnoses:  Acute hypoxic respiratory failure -secondary to community-acquired pneumoniaandacute pulmonary edema from acute diastolic CHF -Independently reviewed chest x-ray done on 04/10/2018 which revealed dextrocardia and bilateral pulmonary infiltrates -Continue azithromycin and IV ceftriaxone -Obtain CBC in the morning -Continue nebs -pulmonary toilet with hypersaline nebs 3 times daily O2 supplementation to maintain O2 saturation between 88 and 92% -Will need pulmonology follow-up outpatient due to underlying condition withKartagener syndrome -suspect underlying COPD--will need outpt PFTs--has had significant second hand smoke exposure -ambulatory pulse showed desaturation-->d/c home with 3L [place] -will need outpt sleep study and PFTs  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 -now euvolemic -7/30 echo--EF 60-65%, indeterminant diastolic function -IV lasix to po lasix 20 mg daily -daily weights -discharge weight 256  SVT -pt developed SVT 140s early am 04/14/18 -required IV lopressor x 2-->now back to sinus -started metoprolol 12.5 mg bid-->no further recurrence -pt still chewing tobacco despite having nicotine patch--d/c  patch -in part due to acute medical illness -personally reviewed EKG--SVT, nonspecific Twave changes -am BMP  Community acquired pneumonia -finished 5 days ceftriaxone and azithromycin  Elevated troponin -due todemand ischemia -Cardiology consulted and following -Asymptomatic -Denies chest pain -Troponin peaked at 0.43and trended down Last 2D echo done on 04/10/2018 revealed normal LVEF with normal wall motion -Twelve-lead EKG done on admission independently reviewed revealed sinus rhythm with rate of 94 with no specific ST-T changes  Kartagener syndrome -Will need to follow-up with pulmonologyoutpatient  Unspecified thrombocytosis 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 -due to combination of iron deficiency and acute medical illness -iron saturation 3%, ferritin 160 -Feraheme x 1 given 8/2 -f/u outpatient hematology  Chronic normocytic anemia Negative FOBT No sign of overt bleeding Repeat CBC in the morning  Obesity Recommend weight loss outpatient Regular physical activity and healthy dieting -BMI 39.75       Discharge Instructions   Allergies as of 04/15/2018   No Known Allergies     Medication List    STOP taking these medications   hydrochlorothiazide 12.5 MG capsule Commonly known as:  MICROZIDE     TAKE these medications   albuterol 108 (90 Base) MCG/ACT inhaler Commonly known as:  PROAIR HFA Inhale 2 puffs into the lungs every 4 (four) hours as needed for wheezing or shortness of breath.   azelastine 0.1 % nasal spray Commonly known as:  ASTELIN Place 2 sprays into both nostrils 2 (two) times daily. Use in each nostril as directed   budesonide-formoterol 160-4.5 MCG/ACT inhaler Commonly known as:  SYMBICORT Inhale 2 puffs into the lungs 2 (two) times daily.   DEXILANT 60 MG capsule Generic drug:  dexlansoprazole TAKE (1) CAPSULE BY MOUTH EVERY DAY.   DULoxetine 60 MG capsule Commonly known as:  CYMBALTA Take 60 mg by mouth daily.    folic acid 1 MG tablet Commonly known as:  FOLVITE Take 1  mg by mouth daily.   furosemide 20 MG tablet Commonly known as:  LASIX Take 1 tablet (20 mg total) by mouth daily. Start taking on:  04/16/2018   gabapentin 600 MG tablet Commonly known as:  NEURONTIN Take 1 tablet (600 mg total) by mouth 2 (two) times daily. What changed:  when to take this   insulin detemir 100 UNIT/ML injection Commonly known as:  LEVEMIR Inject 30-60 Units into the skin 2 (two) times daily. 30 units in the am and 60 units at qhs   losartan 100 MG tablet Commonly known as:  COZAAR Take 100 mg by mouth daily.   metFORMIN 1000 MG tablet Commonly known as:  GLUCOPHAGE Take 1,000 mg by mouth 2 (two) times daily with a meal.   metoprolol tartrate 25 MG tablet Commonly known as:  LOPRESSOR Take 0.5 tablets (12.5 mg total) by mouth 2 (two) times daily.   multivitamin tablet Take 1 tablet by mouth daily.   pioglitazone 45 MG tablet Commonly known as:  ACTOS Take 45 mg by mouth daily.   promethazine 12.5 MG tablet Commonly known as:  PHENERGAN Take 12.5 mg by mouth every 6 (six) hours as needed for nausea.   rOPINIRole 1 MG tablet Commonly known as:  REQUIP Take 1 mg by mouth at bedtime.   rosuvastatin 40 MG tablet Commonly known as:  CRESTOR Take 1 tablet by mouth every evening.   sitaGLIPtin 100 MG tablet Commonly known as:  JANUVIA Take 100 mg by mouth daily.   traMADol 50 MG tablet Commonly known as:  ULTRAM Take 1 tablet (50 mg total) by mouth every 6 (six) hours as needed.            Durable Medical Equipment  (From admission, onward)        Start     Ordered   04/12/18 1310  For home use only DME Pulse oximeter  Once     04/12/18 1309   04/12/18 1308  For home use only DME oxygen  Once    Comments:  SATURATION QUALIFICATIONS: (This note is used to comply with regulatory documentation for home oxygen)  Patient Saturations on Room Air at Rest = 86%  Patient  Saturations on Room Air while Ambulating = 85%  Patient Saturations on 3 Liters of oxygen while Ambulating = 89%   Question Answer Comment  Mode or (Route) Nasal cannula   Liters per Minute 3   Frequency Continuous (stationary and portable oxygen unit needed)   Oxygen conserving device Yes   Oxygen delivery system Gas      04/12/18 1309     Follow-up Information    Arnoldo Lenis, MD On 04/27/2018.   Specialty:  Cardiology Why:  at 9:40 am Contact information: [address] [place] [address] [phone]          No Known Allergies  Consultations:  cardiology   Procedures/Studies: Dg Chest 2 View  Result Date: 04/10/2018 CLINICAL DATA:  Productive cough, shortness of breath. EXAM: CHEST - 2 VIEW COMPARISON:  Radiographs of October 14, 2016. FINDINGS: The heart size and mediastinal contours are within normal limits. No pneumothorax is noted. Minimal bilateral pleural effusions are noted. Increased bilateral lung opacities are noted concerning for edema or possibly pneumonia. The visualized skeletal structures are unremarkable. IMPRESSION: Increased bilateral lung opacities are noted concerning for edema or possibly pneumonia. Continued radiographic follow-up is recommended. Electronically Signed   By: Marijo Conception, [name]   On: 04/10/2018 12:21  Discharge Exam: Vitals:   04/15/18 0541 04/15/18 0724  BP: (!) 120/41   Pulse: 65   Resp: 19   Temp: 98 F (36.7 C)   SpO2: 96% 99%   Vitals:   04/14/18 1329 04/14/18 2131 04/15/18 0541 04/15/18 0724  BP: (!) 116/45 (!) 132/53 (!) 120/41   Pulse: 69 74 65   Resp: 20 20 19    Temp: 98 F (36.7 C) (!) 97.5 F (36.4 C) 98 F (36.7 C)   TempSrc: Oral Oral Oral   SpO2: 97% 95% 96% 99%  Weight:   116.4 kg (256 lb 9.9 oz)   Height:        General: Pt is alert, awake, not in acute distress Cardiovascular: RRR, S1/S2 +, no rubs, no gallops Respiratory: diminished BS without wheeze Abdominal:  Soft, NT, ND, bowel sounds + Extremities: no edema, no cyanosis   The results of significant diagnostics from this hospitalization (including imaging, microbiology, ancillary and laboratory) are listed below for reference.    Significant Diagnostic Studies: Dg Chest 2 View  Result Date: 04/10/2018 CLINICAL DATA:  Productive cough, shortness of breath. EXAM: CHEST - 2 VIEW COMPARISON:  Radiographs of October 14, 2016. FINDINGS: The heart size and mediastinal contours are within normal limits. No pneumothorax is noted. Minimal bilateral pleural effusions are noted. Increased bilateral lung opacities are noted concerning for edema or possibly pneumonia. The visualized skeletal structures are unremarkable. IMPRESSION: Increased bilateral lung opacities are noted concerning for edema or possibly pneumonia. Continued radiographic follow-up is recommended. Electronically Signed   By: Marijo Conception, [name]   On: 04/10/2018 12:21     Microbiology: Recent Results (from the past 240 hour(s))  Blood culture (routine x 2)     Status: None   Collection Time: 04/10/18  1:08 PM  Result Value Ref Range Status   Specimen Description BLOOD LEFT HAND DRAWN BY RN  Final   Special Requests   Final    BOTTLES DRAWN AEROBIC AND ANAEROBIC Blood Culture adequate volume   Culture   Final    NO GROWTH 5 DAYS Performed at [facility], [address], [place], [place] [address]    Report Status 04/15/2018 FINAL  Final  Blood culture (routine x 2)     Status: None   Collection Time: 04/10/18  1:27 PM  Result Value Ref Range Status   Specimen Description BLOOD RIGHT HAND  Final   Special Requests   Final    BOTTLES DRAWN AEROBIC AND ANAEROBIC Blood Culture adequate volume   Culture   Final    NO GROWTH 5 DAYS Performed at [facility], [address], [place], [place] [address]    Report Status 04/15/2018 FINAL  Final  Respiratory Panel by PCR     Status: None   Collection Time: 04/11/18  8:23 AM  Result Value  Ref Range Status   Adenovirus NOT DETECTED NOT DETECTED Final   Coronavirus 229E NOT DETECTED NOT DETECTED Final   Coronavirus HKU1 NOT DETECTED NOT DETECTED Final   Coronavirus NL63 NOT DETECTED NOT DETECTED Final   Coronavirus OC43 NOT DETECTED NOT DETECTED Final   Metapneumovirus NOT DETECTED NOT DETECTED Final   Rhinovirus / Enterovirus NOT DETECTED NOT DETECTED Final   Influenza A NOT DETECTED NOT DETECTED Final   Influenza B NOT DETECTED NOT DETECTED Final   Parainfluenza Virus 1 NOT DETECTED NOT DETECTED Final   Parainfluenza Virus 2 NOT DETECTED NOT DETECTED Final   Parainfluenza Virus 3 NOT DETECTED NOT  DETECTED Final   Parainfluenza Virus 4 NOT DETECTED NOT DETECTED Final   Respiratory Syncytial Virus NOT DETECTED NOT DETECTED Final   Bordetella pertussis NOT DETECTED NOT DETECTED Final   Chlamydophila pneumoniae NOT DETECTED NOT DETECTED Final   Mycoplasma pneumoniae NOT DETECTED NOT DETECTED Final    Comment: Performed at [facility], [place] [address] [place], [place] [address]     Labs: Basic Metabolic Panel: Recent Labs  Lab 04/11/18 0404 04/12/18 0408 04/13/18 0446 04/14/18 0551 04/15/18 0624  NA 140 142 137 140 141  K 3.8 4.4 4.2 4.0 4.4  CL 98 97* 94* 98 100  CO2 33* 34* 33* 34* 34*  GLUCOSE 197* 181* 180* 105* 76  BUN 14 19 17 15 14   CREATININE 0.90 1.13 0.89 0.86 0.81  CALCIUM 8.8* 9.5 8.8* 9.5 9.8  MG  --   --   --   --  2.3   Liver Function Tests: Recent Labs  Lab 04/10/18 1308  AST 23  ALT 23  ALKPHOS 85  BILITOT 0.4  PROT 6.5  ALBUMIN 3.1*   No results for input(s): LIPASE, AMYLASE in the last 168 hours. No results for input(s): AMMONIA in the last 168 hours. CBC: Recent Labs  Lab 04/10/18 1308 04/11/18 0404 04/12/18 0408 04/13/18 0446 04/14/18 0551 04/15/18 0624  WBC 12.3* 11.0* 14.3* 12.1* 14.1* 12.9*  NEUTROABS 9.6*  --   --   --   --   --   HGB 8.2* 8.7* 10.0* 8.5* 9.6* 9.8*  HCT 26.1* 27.7* 32.0* 27.3* 31.5* 32.2*   MCV 90.9 93.6 95.0 93.5 94.0 94.4  PLT 478* 522* 691* 643* 696* 730*   Cardiac Enzymes: Recent Labs  Lab 04/10/18 1308 04/10/18 1557 04/10/18 2143 04/11/18 0404  TROPONINI 0.30* 0.42* 0.43* 0.35*   BNP: Invalid input(s): POCBNP CBG: Recent Labs  Lab 04/14/18 1605 04/14/18 2127 04/15/18 0717 04/15/18 0813 04/15/18 1059  GLUCAP 160* 136* 67* 134* 144*    Time coordinating discharge:  36 minutes  Signed:  Orson Eva, DO [facility] Pager: [phone] 04/15/2018, 12:23 PM

## 2018-04-21 ENCOUNTER — Emergency Department (HOSPITAL_COMMUNITY): Payer: 59

## 2018-04-21 ENCOUNTER — Other Ambulatory Visit: Payer: Self-pay

## 2018-04-21 ENCOUNTER — Encounter (HOSPITAL_COMMUNITY): Payer: Self-pay | Admitting: Emergency Medicine

## 2018-04-21 ENCOUNTER — Inpatient Hospital Stay (HOSPITAL_COMMUNITY)
Admission: EM | Admit: 2018-04-21 | Discharge: 2018-04-24 | DRG: 291 | Disposition: A | Discharge status: Home / Self Care | Payer: 59 | Attending: Internal Medicine | Admitting: Internal Medicine

## 2018-04-21 DIAGNOSIS — J441 Chronic obstructive pulmonary disease with (acute) exacerbation: Secondary | ICD-10-CM | POA: Diagnosis present

## 2018-04-21 DIAGNOSIS — Z7722 Contact with and (suspected) exposure to environmental tobacco smoke (acute) (chronic): Secondary | ICD-10-CM | POA: Diagnosis present

## 2018-04-21 DIAGNOSIS — T380X5A Adverse effect of glucocorticoids and synthetic analogues, initial encounter: Secondary | ICD-10-CM | POA: Diagnosis not present

## 2018-04-21 DIAGNOSIS — R0603 Acute respiratory distress: Secondary | ICD-10-CM

## 2018-04-21 DIAGNOSIS — I110 Hypertensive heart disease with heart failure: Secondary | ICD-10-CM | POA: Diagnosis present

## 2018-04-21 DIAGNOSIS — F329 Major depressive disorder, single episode, unspecified: Secondary | ICD-10-CM | POA: Diagnosis present

## 2018-04-21 DIAGNOSIS — R402250 Coma scale, best verbal response, oriented, unspecified time: Secondary | ICD-10-CM | POA: Diagnosis present

## 2018-04-21 DIAGNOSIS — E1140 Type 2 diabetes mellitus with diabetic neuropathy, unspecified: Secondary | ICD-10-CM | POA: Diagnosis present

## 2018-04-21 DIAGNOSIS — E119 Type 2 diabetes mellitus without complications: Secondary | ICD-10-CM

## 2018-04-21 DIAGNOSIS — E669 Obesity, unspecified: Secondary | ICD-10-CM | POA: Diagnosis present

## 2018-04-21 DIAGNOSIS — I5032 Chronic diastolic (congestive) heart failure: Secondary | ICD-10-CM | POA: Diagnosis present

## 2018-04-21 DIAGNOSIS — R402140 Coma scale, eyes open, spontaneous, unspecified time: Secondary | ICD-10-CM | POA: Diagnosis present

## 2018-04-21 DIAGNOSIS — Z8619 Personal history of other infectious and parasitic diseases: Secondary | ICD-10-CM | POA: Diagnosis not present

## 2018-04-21 DIAGNOSIS — Z794 Long term (current) use of insulin: Secondary | ICD-10-CM | POA: Diagnosis not present

## 2018-04-21 DIAGNOSIS — J471 Bronchiectasis with (acute) exacerbation: Secondary | ICD-10-CM | POA: Diagnosis present

## 2018-04-21 DIAGNOSIS — Q893 Situs inversus: Secondary | ICD-10-CM

## 2018-04-21 DIAGNOSIS — Q240 Dextrocardia: Secondary | ICD-10-CM | POA: Diagnosis not present

## 2018-04-21 DIAGNOSIS — Z7951 Long term (current) use of inhaled steroids: Secondary | ICD-10-CM

## 2018-04-21 DIAGNOSIS — R402360 Coma scale, best motor response, obeys commands, unspecified time: Secondary | ICD-10-CM | POA: Diagnosis present

## 2018-04-21 DIAGNOSIS — Z8711 Personal history of peptic ulcer disease: Secondary | ICD-10-CM | POA: Diagnosis not present

## 2018-04-21 DIAGNOSIS — J9811 Atelectasis: Secondary | ICD-10-CM | POA: Diagnosis present

## 2018-04-21 DIAGNOSIS — I5033 Acute on chronic diastolic (congestive) heart failure: Secondary | ICD-10-CM | POA: Diagnosis present

## 2018-04-21 DIAGNOSIS — F17220 Nicotine dependence, chewing tobacco, uncomplicated: Secondary | ICD-10-CM | POA: Diagnosis present

## 2018-04-21 DIAGNOSIS — K219 Gastro-esophageal reflux disease without esophagitis: Secondary | ICD-10-CM | POA: Diagnosis present

## 2018-04-21 DIAGNOSIS — E1165 Type 2 diabetes mellitus with hyperglycemia: Secondary | ICD-10-CM | POA: Diagnosis not present

## 2018-04-21 DIAGNOSIS — J9621 Acute and chronic respiratory failure with hypoxia: Secondary | ICD-10-CM | POA: Diagnosis present

## 2018-04-21 DIAGNOSIS — Z9981 Dependence on supplemental oxygen: Secondary | ICD-10-CM

## 2018-04-21 DIAGNOSIS — Z6838 Body mass index (BMI) 38.0-38.9, adult: Secondary | ICD-10-CM | POA: Diagnosis not present

## 2018-04-21 DIAGNOSIS — J449 Chronic obstructive pulmonary disease, unspecified: Secondary | ICD-10-CM

## 2018-04-21 DIAGNOSIS — I509 Heart failure, unspecified: Secondary | ICD-10-CM

## 2018-04-21 DIAGNOSIS — Z79899 Other long term (current) drug therapy: Secondary | ICD-10-CM

## 2018-04-21 HISTORY — DX: Tachycardia, unspecified: R00.0

## 2018-04-21 HISTORY — DX: Pneumonia, unspecified organism: J18.9

## 2018-04-21 LAB — CBC
HCT: 29.1 % — ABNORMAL LOW (ref 39.0–52.0)
HEMOGLOBIN: 8.9 g/dL — AB (ref 13.0–17.0)
MCH: 28.9 pg (ref 26.0–34.0)
MCHC: 30.6 g/dL (ref 30.0–36.0)
MCV: 94.5 fL (ref 78.0–100.0)
PLATELETS: 637 10*3/uL — AB (ref 150–400)
RBC: 3.08 MIL/uL — ABNORMAL LOW (ref 4.22–5.81)
RDW: 14.6 % (ref 11.5–15.5)
WBC: 14.2 10*3/uL — ABNORMAL HIGH (ref 4.0–10.5)

## 2018-04-21 LAB — BLOOD GAS, ARTERIAL
ACID-BASE EXCESS: 1.6 mmol/L (ref 0.0–2.0)
Bicarbonate: 25.4 mmol/L (ref 20.0–28.0)
DRAWN BY: 234301
O2 Content: 8 L/min
O2 SAT: 97.6 %
PATIENT TEMPERATURE: 37
pCO2 arterial: 49.4 mmHg — ABNORMAL HIGH (ref 32.0–48.0)
pH, Arterial: 7.351 (ref 7.350–7.450)
pO2, Arterial: 116 mmHg — ABNORMAL HIGH (ref 83.0–108.0)

## 2018-04-21 LAB — BASIC METABOLIC PANEL
ANION GAP: 9 (ref 5–15)
BUN: 18 mg/dL (ref 8–23)
CALCIUM: 9.6 mg/dL (ref 8.9–10.3)
CO2: 29 mmol/L (ref 22–32)
CREATININE: 0.88 mg/dL (ref 0.61–1.24)
Chloride: 100 mmol/L (ref 98–111)
GFR calc Af Amer: >60 mL/min (ref >60)
GLUCOSE: 96 mg/dL (ref 70–99)
Potassium: 5.3 mmol/L — ABNORMAL HIGH (ref 3.5–5.1)
Sodium: 138 mmol/L (ref 135–145)

## 2018-04-21 LAB — MRSA PCR SCREENING: MRSA BY PCR: NEGATIVE

## 2018-04-21 LAB — I-STAT TROPONIN, ED: TROPONIN I, POC: 0.01 ng/mL (ref 0.00–0.08)

## 2018-04-21 LAB — GLUCOSE, CAPILLARY
GLUCOSE-CAPILLARY: 115 mg/dL — AB (ref 70–99)
GLUCOSE-CAPILLARY: 301 mg/dL — AB (ref 70–99)
GLUCOSE-CAPILLARY: 246 mg/dL — AB (ref 70–99)

## 2018-04-21 LAB — BRAIN NATRIURETIC PEPTIDE: B Natriuretic Peptide: 499 pg/mL — ABNORMAL HIGH (ref 0.0–100.0)

## 2018-04-21 MED ORDER — SODIUM CHLORIDE 0.9% FLUSH
3.0000 mL | Freq: Two times a day (BID) | INTRAVENOUS | Status: DC
  Administered 2018-04-21 – 2018-04-24 (×6): 3 mL via INTRAVENOUS

## 2018-04-21 MED ORDER — MOMETASONE FURO-FORMOTEROL FUM 200-5 MCG/ACT IN AERO
2.0000 | INHALATION_SPRAY | Freq: Two times a day (BID) | RESPIRATORY_TRACT | Status: DC
  Administered 2018-04-21 – 2018-04-24 (×6): 2 via RESPIRATORY_TRACT
  Filled 2018-04-21: qty 8.8

## 2018-04-21 MED ORDER — ENOXAPARIN SODIUM 40 MG/0.4ML ~~LOC~~ SOLN
40.0000 mg | SUBCUTANEOUS | Status: DC
  Administered 2018-04-21 – 2018-04-23 (×3): 40 mg via SUBCUTANEOUS
  Filled 2018-04-21 (×3): qty 0.4

## 2018-04-21 MED ORDER — ALBUTEROL (5 MG/ML) CONTINUOUS INHALATION SOLN
15.0000 mg/h | INHALATION_SOLUTION | Freq: Once | RESPIRATORY_TRACT | Status: AC
  Administered 2018-04-21: 15 mg/h via RESPIRATORY_TRACT

## 2018-04-21 MED ORDER — LORAZEPAM 2 MG/ML IJ SOLN
1.0000 mg | Freq: Four times a day (QID) | INTRAMUSCULAR | Status: DC | PRN
  Administered 2018-04-21 – 2018-04-23 (×3): 1 mg via INTRAVENOUS
  Filled 2018-04-21 (×4): qty 1

## 2018-04-21 MED ORDER — INSULIN DETEMIR 100 UNIT/ML ~~LOC~~ SOLN
60.0000 [IU] | Freq: Every day | SUBCUTANEOUS | Status: DC
  Administered 2018-04-21 – 2018-04-23 (×3): 60 [IU] via SUBCUTANEOUS
  Filled 2018-04-21 (×4): qty 0.6

## 2018-04-21 MED ORDER — ALBUTEROL (5 MG/ML) CONTINUOUS INHALATION SOLN
INHALATION_SOLUTION | RESPIRATORY_TRACT | Status: AC
  Administered 2018-04-21: 15 mg/h via RESPIRATORY_TRACT
  Filled 2018-04-21: qty 20

## 2018-04-21 MED ORDER — LOSARTAN POTASSIUM 50 MG PO TABS
100.0000 mg | ORAL_TABLET | Freq: Every day | ORAL | Status: DC
  Administered 2018-04-22 – 2018-04-24 (×3): 100 mg via ORAL
  Filled 2018-04-21 (×3): qty 2

## 2018-04-21 MED ORDER — FUROSEMIDE 10 MG/ML IJ SOLN
40.0000 mg | Freq: Two times a day (BID) | INTRAMUSCULAR | Status: DC
  Administered 2018-04-21 – 2018-04-24 (×6): 40 mg via INTRAVENOUS
  Filled 2018-04-21 (×6): qty 4

## 2018-04-21 MED ORDER — IPRATROPIUM-ALBUTEROL 0.5-2.5 (3) MG/3ML IN SOLN
3.0000 mL | Freq: Four times a day (QID) | RESPIRATORY_TRACT | Status: DC
  Administered 2018-04-21 (×2): 3 mL via RESPIRATORY_TRACT
  Filled 2018-04-21 (×2): qty 3

## 2018-04-21 MED ORDER — ONDANSETRON HCL 4 MG/2ML IJ SOLN: 4.0000 mg | Freq: Four times a day (QID) | INTRAMUSCULAR | Status: DC | PRN

## 2018-04-21 MED ORDER — ALBUTEROL SULFATE (2.5 MG/3ML) 0.083% IN NEBU
2.5000 mg | INHALATION_SOLUTION | RESPIRATORY_TRACT | Status: DC | PRN
Start: 2018-04-21 — End: 2018-04-24

## 2018-04-21 MED ORDER — SODIUM CHLORIDE 0.9 % IV SOLN: 250.0000 mL | INTRAVENOUS | Status: DC | PRN

## 2018-04-21 MED ORDER — METHYLPREDNISOLONE SODIUM SUCC 125 MG IJ SOLR
60.0000 mg | Freq: Two times a day (BID) | INTRAMUSCULAR | Status: DC
  Administered 2018-04-21 – 2018-04-23 (×4): 60 mg via INTRAVENOUS
  Filled 2018-04-21 (×4): qty 2

## 2018-04-21 MED ORDER — METHYLPREDNISOLONE SODIUM SUCC 125 MG IJ SOLR
125.0000 mg | Freq: Once | INTRAMUSCULAR | Status: AC
  Administered 2018-04-21: 125 mg via INTRAVENOUS
  Filled 2018-04-21: qty 2

## 2018-04-21 MED ORDER — INSULIN ASPART 100 UNIT/ML ~~LOC~~ SOLN
0.0000 [IU] | Freq: Every day | SUBCUTANEOUS | Status: DC
  Administered 2018-04-21 – 2018-04-22 (×2): 2 [IU] via SUBCUTANEOUS

## 2018-04-21 MED ORDER — GUAIFENESIN ER 600 MG PO TB12
1200.0000 mg | ORAL_TABLET | Freq: Two times a day (BID) | ORAL | Status: DC
  Administered 2018-04-21 – 2018-04-24 (×7): 1200 mg via ORAL
  Filled 2018-04-21 (×7): qty 2

## 2018-04-21 MED ORDER — PANTOPRAZOLE SODIUM 40 MG PO TBEC
40.0000 mg | DELAYED_RELEASE_TABLET | Freq: Every day | ORAL | Status: DC
  Administered 2018-04-22 – 2018-04-24 (×3): 40 mg via ORAL
  Filled 2018-04-21 (×3): qty 1

## 2018-04-21 MED ORDER — SODIUM CHLORIDE 0.9% FLUSH: 3.0000 mL | INTRAVENOUS | Status: DC | PRN

## 2018-04-21 MED ORDER — IPRATROPIUM-ALBUTEROL 0.5-2.5 (3) MG/3ML IN SOLN
3.0000 mL | Freq: Once | RESPIRATORY_TRACT | Status: DC
  Filled 2018-04-21: qty 3

## 2018-04-21 MED ORDER — ROSUVASTATIN CALCIUM 20 MG PO TABS
40.0000 mg | ORAL_TABLET | Freq: Every evening | ORAL | Status: DC
  Administered 2018-04-21 – 2018-04-23 (×3): 40 mg via ORAL
  Filled 2018-04-21 (×3): qty 2

## 2018-04-21 MED ORDER — GABAPENTIN 300 MG PO CAPS
600.0000 mg | ORAL_CAPSULE | Freq: Three times a day (TID) | ORAL | Status: DC
Start: 2018-04-21 — End: 2018-04-24
  Administered 2018-04-21 – 2018-04-24 (×9): 600 mg via ORAL
  Filled 2018-04-21: qty 6
  Filled 2018-04-21 (×8): qty 2

## 2018-04-21 MED ORDER — METOPROLOL TARTRATE 25 MG PO TABS
12.5000 mg | ORAL_TABLET | Freq: Two times a day (BID) | ORAL | Status: DC
  Administered 2018-04-21 – 2018-04-24 (×6): 12.5 mg via ORAL
  Filled 2018-04-21 (×6): qty 1

## 2018-04-21 MED ORDER — LORAZEPAM 2 MG/ML IJ SOLN
1.0000 mg | Freq: Once | INTRAMUSCULAR | Status: AC
  Administered 2018-04-21: 1 mg via INTRAVENOUS
  Filled 2018-04-21: qty 1

## 2018-04-21 MED ORDER — SODIUM CHLORIDE 3 % IN NEBU
4.0000 mL | INHALATION_SOLUTION | Freq: Three times a day (TID) | RESPIRATORY_TRACT | Status: AC
  Administered 2018-04-21 – 2018-04-24 (×9): 4 mL via RESPIRATORY_TRACT
  Filled 2018-04-21 (×9): qty 4

## 2018-04-21 MED ORDER — INSULIN ASPART 100 UNIT/ML ~~LOC~~ SOLN
0.0000 [IU] | Freq: Three times a day (TID) | SUBCUTANEOUS | Status: DC
  Administered 2018-04-21: 15 [IU] via SUBCUTANEOUS
  Administered 2018-04-22: 3 [IU] via SUBCUTANEOUS
  Administered 2018-04-22 – 2018-04-23 (×3): 7 [IU] via SUBCUTANEOUS

## 2018-04-21 MED ORDER — FUROSEMIDE 10 MG/ML IJ SOLN
40.0000 mg | Freq: Once | INTRAMUSCULAR | Status: AC
Start: 2018-04-21 — End: 2018-04-21
  Administered 2018-04-21: 40 mg via INTRAVENOUS
  Filled 2018-04-21: qty 4

## 2018-04-21 MED ORDER — INSULIN DETEMIR 100 UNIT/ML ~~LOC~~ SOLN
15.0000 [IU] | Freq: Every day | SUBCUTANEOUS | Status: DC
  Administered 2018-04-22 – 2018-04-24 (×3): 15 [IU] via SUBCUTANEOUS
  Filled 2018-04-21 (×4): qty 0.15

## 2018-04-21 MED ORDER — ROPINIROLE HCL 1 MG PO TABS
1.0000 mg | ORAL_TABLET | Freq: Every day | ORAL | Status: DC
  Administered 2018-04-21 – 2018-04-23 (×3): 1 mg via ORAL
  Filled 2018-04-21 (×5): qty 1

## 2018-04-21 MED ORDER — ACETAMINOPHEN 325 MG PO TABS
650.0000 mg | ORAL_TABLET | ORAL | Status: DC | PRN
  Administered 2018-04-21: 650 mg via ORAL
  Filled 2018-04-21: qty 2

## 2018-04-21 MED ORDER — DULOXETINE HCL 60 MG PO CPEP
60.0000 mg | ORAL_CAPSULE | Freq: Every day | ORAL | Status: DC
  Administered 2018-04-22 – 2018-04-24 (×3): 60 mg via ORAL
  Filled 2018-04-21 (×3): qty 1

## 2018-04-21 NOTE — H&P (Signed)
History and Physical    Brett [name] FIE:[id number] DOB: 03/31/1951 DOA: 04/21/2018  PCP: [name], MD  Patient coming from: Home  I have personally briefly reviewed patient's old medical records in [place]  Chief Complaint: Shortness of breath  HPI: Brett [name] is a 67 y.o. male with medical history significant of chronic diastolic congestive heart failure.  Kartagener's syndrome, diabetes and possible asthma versus COPD, was recently in the hospital when he was treated for community-acquired pneumonia as well as decompensated CHF.  He was discharged home with supplemental oxygen at 3 L.  He reports at the time of discharge, he felt okay, but when he returned home his condition progressively got worse.  He reports that in the past few days, he has had worsening shortness of breath.  He is noted worsening lower extremity edema.  He reports compliance with his diuretics.  He has not had any chest pain or fever.  He does feel congested, but reports that he is not coughing or cannot produce any sputum.  He feels a tightness across his chest and upper back.  Today, his wife checked his oxygen saturations while on oxygen and was noted to be in the 70s while standing, this returned to the 90s when he sat down.  He was brought to the ER for evaluation for progressive shortness of breath.  ED Course: On arrival to the emergency room, he was noted to be tachypneic and hypoxic on 3 L.  He was noted to have mild wheeze and had poor air movement.  He received a nebulizer treatment, dose of steroids as well as intravenous Lasix.  He was noted to have significant lower extremity edema and chest x-ray did show evidence of decompensated CHF.  BNP was mildly elevated from prior levels.  He is feeling somewhat better at this time.  Due to his persistent hypoxia on nasal cannula, he was placed on a partial rebreather.  Review of Systems: As per HPI otherwise 10 point review of systems negative.     Past Medical History:  Diagnosis Date  . Depression   . Dextrocardia   . Diabetes mellitus   . H. pylori infection 11/09/2012   treated with pylera  . HTN (hypertension)    Cholesterol  . Iron deficiency anemia, unspecified 10/18/2012  . Neuropathy   . Pneumonia   . Tachycardia     Past Surgical History:  Procedure Laterality Date  . CATARACT EXTRACTION, BILATERAL  2016  . COLONOSCOPY WITH ESOPHAGOGASTRODUODENOSCOPY (EGD) N/A 11/09/2012   Dr. 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 hpylori on bx- treated with pylera. TCS- normal rectum. 1 diminutive polyp in the mid descending segment. 1-81mm polyp in the mid desending segment o/w the remainder of the colonic mucosa appeared normal. tubular adenoma on bx  . GIVENS CAPSULE STUDY N/A 10/07/2013   no source for anemia or heme positive stool noted  . NECK SURGERY     bone spurs   Social history:  reports that he has never smoked. His smokeless tobacco use includes chew. He reports that he does not drink alcohol or use drugs.  No Known Allergies  Family History  Problem Relation Age of Onset  . Diabetes Sister        Fx  . Heart defect Sister        Fx  . Arthritis Sister        Fx  . Asthma Sister  Fx  . Kidney disease Sister        Fx  . Pancreatitis Sister    Prior to Admission medications   Medication Sig Start Date End Date Taking? Authorizing Provider  albuterol (PROAIR HFA) 108 (90 Base) MCG/ACT inhaler Inhale 2 puffs into the lungs every 4 (four) hours as needed for wheezing or shortness of breath. 09/27/16  Yes Valentina Shaggy, MD  azelastine (ASTELIN) 0.1 % nasal spray Place 2 sprays into both nostrils 2 (two) times daily. Use in each nostril as directed 09/27/16  Yes Valentina Shaggy, MD  budesonide-formoterol [facility]) 160-4.5 MCG/ACT inhaler Inhale 2 puffs into the lungs 2 (two) times daily. 09/27/16  Yes  Valentina Shaggy, MD  DEXILANT 60 MG capsule TAKE (1) CAPSULE BY MOUTH EVERY DAY. 08/26/15  Yes [name], PA-C  DULoxetine (CYMBALTA) 60 MG capsule Take 60 mg by mouth daily.   Yes [provider]  folic acid (FOLVITE) 1 MG tablet Take 1 mg by mouth daily.   Yes [provider]  furosemide (LASIX) 20 MG tablet Take 1 tablet (20 mg total) by mouth daily. 04/16/18  Yes Tat, Shanon Brow, MD  gabapentin (NEURONTIN) 600 MG tablet Take 1 tablet (600 mg total) by mouth 2 (two) times daily. Patient taking differently: Take 600 mg by mouth 3 (three) times daily.  02/10/18  Yes Triplett, Tammy, PA-C  insulin detemir (LEVEMIR) 100 UNIT/ML injection Inject 15-60 Units into the skin 2 (two) times daily. 15 units in the am and 60 units at qhs   Yes [provider]  losartan (COZAAR) 100 MG tablet Take 100 mg by mouth daily.   Yes [provider]  metFORMIN (GLUCOPHAGE) 1000 MG tablet Take 1,000 mg by mouth 2 (two) times daily with a meal.   Yes [provider]  metoprolol tartrate (LOPRESSOR) 25 MG tablet Take 0.5 tablets (12.5 mg total) by mouth 2 (two) times daily. 04/15/18  Yes Tat, Shanon Brow, MD  Multiple Vitamin (MULTIVITAMIN) tablet Take 1 tablet by mouth daily.   Yes [provider]  pioglitazone (ACTOS) 45 MG tablet Take 45 mg by mouth daily.   Yes [provider]  rOPINIRole (REQUIP) 1 MG tablet Take 1 mg by mouth at bedtime.   Yes [provider]  rosuvastatin (CRESTOR) 40 MG tablet Take 1 tablet by mouth every evening. 03/22/18  Yes [provider]  sitaGLIPtin (JANUVIA) 100 MG tablet Take 100 mg by mouth daily.    Yes [provider]  traMADol (ULTRAM) 50 MG tablet Take 1 tablet (50 mg total) by mouth every 6 (six) hours as needed. Patient not taking: Reported on 04/21/2018 02/10/18   Kem Parkinson, PA-C    Physical Exam: Vitals:   04/21/18 1300 04/21/18 1400 04/21/18 1500 04/21/18 1524  BP: (!) 159/54 (!) 169/67  (!) 138/58   Pulse: 77 90 95   Resp: (!) 22 19 (!) 26   Temp:      TempSrc:      SpO2: 93% 90% (!) 82% 98%  Weight:      Height:        Constitutional: Sitting up in bed.  Appears mildly anxious. Vitals:   04/21/18 1300 04/21/18 1400 04/21/18 1500 04/21/18 1524  BP: (!) 159/54 (!) 169/67 (!) 138/58   Pulse: 77 90 95   Resp: (!) 22 19 (!) 26   Temp:      TempSrc:      SpO2: 93% 90% (!) 82% 98%  Weight:  Height:       Eyes: PERRL, lids and conjunctivae normal ENMT: Mucous membranes are moist. Posterior pharynx clear of any exudate or lesions.Normal dentition.  Neck: normal, supple, no masses, no thyromegaly Respiratory: Crackles at bases, scattered rhonchi.  Audible wheezing upper airways.  Mild increase in respiratory effort. No accessory muscle use.  Cardiovascular: Regular rate and rhythm, no murmurs / rubs / gallops. 1-2+ extremity edema. 2+ pedal pulses. No carotid bruits.  Abdomen: no tenderness, no masses palpated. No hepatosplenomegaly. Bowel sounds positive.  Musculoskeletal: no clubbing / cyanosis. No joint deformity upper and lower extremities. Good ROM, no contractures. Normal muscle tone.  Skin: no rashes, lesions, ulcers. No induration Neurologic: CN 2-12 grossly intact. Sensation intact, DTR normal. Strength 5/5 in all 4.  Psychiatric: Normal judgment and insight. Alert and oriented x 3. Normal mood.    Labs on Admission: I have personally reviewed following labs and imaging studies  CBC: Recent Labs  Lab 04/15/18 0624 04/21/18 0846  WBC 12.9* 14.2*  HGB 9.8* 8.9*  HCT 32.2* 29.1*  MCV 94.4 94.5  PLT 730* 144*   Basic Metabolic Panel: Recent Labs  Lab 04/15/18 0624 04/21/18 0846  NA 141 138  K 4.4 5.3*  CL 100 100  CO2 34* 29  GLUCOSE 76 96  BUN 14 18  CREATININE 0.81 0.88  CALCIUM 9.8 9.6  MG 2.3  --    GFR: Estimated Creatinine Clearance: 104.6 mL/min (by C-G formula based on SCr of 0.88 mg/dL). Liver Function Tests: No results for  input(s): AST, ALT, ALKPHOS, BILITOT, PROT, ALBUMIN in the last 168 hours. No results for input(s): LIPASE, AMYLASE in the last 168 hours. No results for input(s): AMMONIA in the last 168 hours. Coagulation Profile: No results for input(s): INR, PROTIME in the last 168 hours. Cardiac Enzymes: No results for input(s): CKTOTAL, CKMB, CKMBINDEX, TROPONINI in the last 168 hours. BNP (last 3 results) No results for input(s): PROBNP in the last 8760 hours. HbA1C: No results for input(s): HGBA1C in the last 72 hours. CBG: Recent Labs  Lab 04/15/18 0717 04/15/18 0813 04/15/18 1059 04/21/18 1134 04/21/18 1640  GLUCAP 67* 134* 144* 115* 301*   Lipid Profile: No results for input(s): CHOL, HDL, LDLCALC, TRIG, CHOLHDL, LDLDIRECT in the last 72 hours. Thyroid Function Tests: No results for input(s): TSH, T4TOTAL, FREET4, T3FREE, THYROIDAB in the last 72 hours. Anemia Panel: No results for input(s): VITAMINB12, FOLATE, FERRITIN, TIBC, IRON, RETICCTPCT in the last 72 hours. Urine analysis:    Component Value Date/Time   COLORURINE YELLOW 05/13/2015 1440   APPEARANCEUR CLEAR 05/13/2015 1440   LABSPEC <1.005 (L) 05/13/2015 1440   PHURINE 5.5 05/13/2015 1440   GLUCOSEU >1000 (A) 05/13/2015 1440   HGBUR NEGATIVE 05/13/2015 1440   BILIRUBINUR NEGATIVE 05/13/2015 1440   KETONESUR NEGATIVE 05/13/2015 1440   PROTEINUR NEGATIVE 05/13/2015 1440   UROBILINOGEN 0.2 05/13/2015 1440   NITRITE NEGATIVE 05/13/2015 1440   LEUKOCYTESUR NEGATIVE 05/13/2015 1440    Radiological Exams on Admission: Dg Chest Port 1 View  Result Date: 04/21/2018 CLINICAL DATA:  Shortness of breath. EXAM: PORTABLE CHEST 1 VIEW COMPARISON:  04/10/2018, 10/14/2016 and chest CT dated 11/25/2016. FINDINGS: Previously noted situs inversus. The cardiac silhouette is currently enlarged with a mild increase in size. Increased prominence of the pulmonary vasculature and interstitial markings with an interval small left pleural  effusion. There is some loss of detail due to patient breathing motion. Again noted are thoracic spine degenerative changes and cervical spine fixation hardware.  IMPRESSION: 1. Interval mild cardiomegaly and changes of congestive heart failure. 2. Previously noted situs inversus. Electronically Signed   By: Claudie Revering [name]   On: 04/21/2018 09:59    EKG: Independently reviewed.  Sinus rhythm without any acute changes.  Assessment/Plan Active Problems:   Insulin-requiring or dependent type II diabetes mellitus ([place])   Kartagener syndrome   Acute on chronic diastolic CHF (congestive heart failure) (HCC)   Acute on chronic respiratory failure with hypoxia (HCC)   COPD with acute exacerbation ([place])     1. Acute on chronic respiratory failure with hypoxia.  Currently on partial rebreather.  Suspect is related to CHF exacerbation versus bronchiectasis due to Kartagener's syndrome.  Continue on pulmonary hygiene and wean down oxygen as tolerated.  Will request pulmonology input to help optimize his respiratory status. 2. Acute on chronic diastolic congestive heart failure.  Start the patient on intravenous Lasix.  He is already on ARB and beta-blockers.  Recently had echocardiogram done on last admission.  Monitor intake and output. 3. COPD versus asthma.  He does not report any prior history of smoking, but reports extensive exposure to secondhand smoke.  He has not had any prior PFTs done.  He is mildly wheezing and sounds tight on exam.  Will continue on low-dose steroids as well as continue bronchodilators.  Continue hypertonic saline nebulizer treatments to help expectorate sputum.  This was beneficial to him on his last admission. 4. Diabetes.  Continue home regimen with the exception of oral agents.  Start on sliding scale insulin.  Monitor blood sugars. 5. Kartagener's syndrome. 6. GERD.  Continue on PPI  Critical care: 45 minutes.  Patient is critically ill and will need to be monitored in  the stepdown unit. He is at high risk for respiratory decompensation.  He will need frequent assessments  DVT prophylaxis: Lovenox Code Status: Full code Family Communication: Discussed with wife at the bedside Disposition Plan: Discharge home once improved Consults called: Pulmonology Admission status: Inpatient, stepdown  Kathie Dike MD [facility] Pager [phone]  If 7PM-7AM, please contact night-coverage [email or web address] Password [facility]  04/21/2018, 5:35 PM

## 2018-04-21 NOTE — ED Provider Notes (Signed)
Medical screening examination/treatment/procedure(s) were conducted as a shared visit with non-physician practitioner(s) and myself.  I personally evaluated the patient during the encounter.  Clinical Impression:   Final diagnoses:  Respiratory distress  Acute congestive heart failure, unspecified heart failure type [facility])    The patient is a 67 year old male, he has a genetic illness causing ciliary dysfunction, recently admitted for pneumonia, placed on oxygen therapy, presents today with increasing work of breathing, shortness of breath moving very little oxygen.  He is tachypneic and has prolonged expiratory phase, he is using accessory muscles.  He has mild peripheral edema.  JVD is difficult to visualize as the patient is very obese with a very generous amount of neck tissue.  His lung sounds are diminished diffusely, he is moving very little air.  The patient's EKG is unremarkable, flattening of his T waves diffusely but no other signs of ischemia.  The patient does not have any fevers, I suspect that he does have a reactive airway disease type pathology and with his increased work of breathing he is critically ill with severe respiratory distress.  Oxygen levels in the room measured at approximately 85%, he is on 3 L of nasal cannula, he will need nonrebreather, continuous nebulizer therapy, steroids and admission to the hospital.  He appears critical ill    EKG Interpretation  Date/Time:  Saturday April 21 2018 08:00:30 EDT Ventricular Rate:  80 PR Interval:    QRS Duration: 81 QT Interval:  354 QTC Calculation: 409 R Axis:   98 Text Interpretation:  Sinus or ectopic atrial rhythm Right axis deviation Low voltage, precordial leads Abnormal lateral Q waves Probable anteroseptal infarct, old Since last tracing rate slower Confirmed by Noemi Chapel [phone]) on 04/21/2018 8:27:45 AM         Noemi Chapel, MD 04/22/18 1529

## 2018-04-21 NOTE — ED Provider Notes (Signed)
[facility] EMERGENCY DEPARTMENT Provider Note   CSN: [id number] Arrival date & time: 04/21/18  0747     History   Chief Complaint Chief Complaint  Patient presents with  . Shortness of Breath    HPI Brett [name] is a 67 y.o. male.  66yo male presents with complaint of shortness of breath, history of Kartagener syndrome, insulin dependent diabetes, CHF, non smoker- uses chewing tobacco. Patient states his symptoms started sometime before his 7/30 admission to the hospital, went to PCP for feeling [facility] and was sent to the ER for O2 sats in the 70s. Patient was admitted for PNA and treated with Rocephin and Zithromax, Lasix for acute on chronic CHF. Patient was dc home on 3L [place]. Patient reports worsening dyspnea today at rest and with exertion. Patient had a home O2 in the mid 70s this morning, which improved after sitting on the sofa however he remained very short of breath and came to the ER. Patient has been using inhalers without any relief, states he can't get anything to come out of the inhaler, does not have a nebulizer. Reports "pain" at rest, worse with exertion, described as a tight band around his chest. Patient agrees with mild swelling in his legs, states improved compared to edema while in the hospital. Also c/o non productive cough, occasional sweats. Denies fevers, chills. No other complaints or concerns.      Past Medical History:  Diagnosis Date  . Depression   . Dextrocardia   . Diabetes mellitus   . H. pylori infection 11/09/2012   treated with pylera  . HTN (hypertension)    Cholesterol  . Iron deficiency anemia, unspecified 10/18/2012  . Neuropathy   . Pneumonia   . Tachycardia     Patient Active Problem List   Diagnosis Date Noted  . Acute on chronic respiratory failure with hypoxia ([place]) 04/21/2018  . SVT (supraventricular tachycardia) ([place]) 04/14/2018  . Paroxysmal SVT (supraventricular tachycardia) ([place]) 04/14/2018  . Acute on chronic diastolic CHF  (congestive heart failure) ([place]) 04/14/2018  . CHF exacerbation ([place]) 04/10/2018  . 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 Restrictive lung disease 11/08/2016  . 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 Moderate persistent asthma, uncomplicated 89/38/1017  . Thrombocytosis ([place]) 10/09/2014  . Abdominal pain 09/23/2014  . Gastritis, Helicobacter pylori 51/10/5850  . Insulin-requiring or dependent type II diabetes mellitus ([name]) 01/03/2014  . 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 GERD (gastroesophageal reflux disease) 10/18/2012  . Nausea 10/18/2012  . Iron deficiency anemia due to chronic blood loss 10/18/2012  . HIP PAIN 08/31/2010  . SPINAL STENOSIS 06/28/2010  . TENDINITIS, CALCIFIC, SHOULDER, RIGHT 06/14/2010  . IMPINGEMENT SYNDROME 06/14/2010    Past Surgical History:  Procedure Laterality Date  . CATARACT EXTRACTION, BILATERAL  2016  . COLONOSCOPY WITH ESOPHAGOGASTRODUODENOSCOPY (EGD) N/A 11/09/2012   Dr. 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 hpylori on bx- treated with pylera. TCS- normal rectum. 1 diminutive polyp in the mid descending segment. 1-65mm polyp in the mid desending segment o/w the remainder of the colonic mucosa appeared normal. tubular adenoma on bx  . GIVENS CAPSULE STUDY N/A 10/07/2013   no source for anemia or heme positive stool noted  . NECK SURGERY     bone spurs        Home Medications    Prior to Admission medications   Medication Sig Start Date End Date Taking? Authorizing Provider  albuterol [facility] HFA) 108 (90 Base)  MCG/ACT inhaler Inhale 2 puffs into the lungs every 4 (four) hours as needed for wheezing or shortness of breath. 09/27/16  Yes Valentina Shaggy, MD  azelastine (ASTELIN) 0.1 % nasal spray Place 2 sprays into both nostrils 2 (two) times daily. Use in each nostril as directed 09/27/16  Yes Valentina Shaggy, MD  DEXILANT 60 MG capsule TAKE (1)  CAPSULE BY MOUTH EVERY DAY. 08/26/15  Yes [name], PA-C  budesonide-formoterol [facility]) 160-4.5 MCG/ACT inhaler Inhale 2 puffs into the lungs 2 (two) times daily. Patient not taking: Reported on 04/10/2018 09/27/16   Valentina Shaggy, MD  DULoxetine (CYMBALTA) 60 MG capsule Take 60 mg by mouth daily.    [provider]  folic acid (FOLVITE) 1 MG tablet Take 1 mg by mouth daily.    [provider]  furosemide (LASIX) 20 MG tablet Take 1 tablet (20 mg total) by mouth daily. 04/16/18   Orson Eva, MD  gabapentin (NEURONTIN) 600 MG tablet Take 1 tablet (600 mg total) by mouth 2 (two) times daily. Patient taking differently: Take 600 mg by mouth 3 (three) times daily.  02/10/18   Triplett, Tammy, PA-C  insulin detemir (LEVEMIR) 100 UNIT/ML injection Inject 30-60 Units into the skin 2 (two) times daily. 30 units in the am and 60 units at qhs    [provider]  losartan (COZAAR) 100 MG tablet Take 100 mg by mouth daily.    [provider]  metFORMIN (GLUCOPHAGE) 1000 MG tablet Take 1,000 mg by mouth 2 (two) times daily with a meal.    [provider]  metoprolol tartrate (LOPRESSOR) 25 MG tablet Take 0.5 tablets (12.5 mg total) by mouth 2 (two) times daily. 04/15/18   Orson Eva, MD  Multiple Vitamin (MULTIVITAMIN) tablet Take 1 tablet by mouth daily.    [provider]  pioglitazone (ACTOS) 45 MG tablet Take 45 mg by mouth daily.    [provider]  promethazine (PHENERGAN) 12.5 MG tablet Take 12.5 mg by mouth every 6 (six) hours as needed for nausea.  06/27/16   [provider]  rOPINIRole (REQUIP) 1 MG tablet Take 1 mg by mouth at bedtime.    [provider]  rosuvastatin (CRESTOR) 40 MG tablet Take 1 tablet by mouth every evening. 03/22/18   [provider]  sitaGLIPtin (JANUVIA) 100 MG tablet Take 100 mg by mouth daily.     [provider]  traMADol (ULTRAM) 50 MG tablet Take 1 tablet (50 mg  total) by mouth every 6 (six) hours as needed. 02/10/18   Kem Parkinson, PA-C    Family History Family History  Problem Relation Age of Onset  . Diabetes Sister        Fx  . Heart defect Sister        Fx  . Arthritis Sister        Fx  . Asthma Sister        Fx  . Kidney disease Sister        Fx  . Pancreatitis Sister     Social History Social History   Tobacco Use  . Smoking status: Never Smoker  . Smokeless tobacco: Current User    Types: Chew  Substance Use Topics  . Alcohol use: No  . Drug use: No     Allergies   Patient has no known allergies.   Review of Systems Review of Systems  Constitutional: Positive for diaphoresis. Negative for chills and fever.  HENT:  Negative for congestion.   Respiratory: Positive for cough, chest tightness and shortness of breath.   Cardiovascular: Positive for leg swelling. Negative for chest pain and palpitations.  Gastrointestinal: Negative for abdominal pain, nausea and vomiting.  Musculoskeletal: Negative for arthralgias and myalgias.  Skin: Negative for color change and wound.  Neurological: Negative for weakness.  Psychiatric/Behavioral: Negative for confusion.  All other systems reviewed and are negative.    Physical Exam Updated Vital Signs BP (!) 132/46   Pulse 76   Temp 98.9 F (37.2 C) (Oral)   Resp 18   Ht 5\' 7"  (1.702 m)   Wt 117.9 kg   SpO2 100%   BMI 40.72 kg/m   Physical Exam  Constitutional: He is oriented to person, place, and time. He appears well-developed and well-nourished. He appears distressed.  HENT:  Head: Normocephalic.  Mouth/Throat: Oropharynx is clear and moist.  Eyes: Pupils are equal, round, and reactive to light.  Neck: Neck supple.  Cardiovascular: Normal rate and normal heart sounds.  Pulmonary/Chest: Tachypnea noted. He is in respiratory distress. He has decreased breath sounds. He has no wheezes. He exhibits no tenderness.  Abdominal: Soft. He exhibits distension. There is  no tenderness.  Musculoskeletal:       Right lower leg: He exhibits edema. He exhibits no tenderness.       Left lower leg: He exhibits edema. He exhibits no tenderness.  Mild edema to bilateral lower extremities   Neurological: He is alert and oriented to person, place, and time.  Skin: Skin is warm and dry. There is pallor.  Psychiatric: He has a normal mood and affect. His behavior is normal.  Nursing note and vitals reviewed.    ED Treatments / Results  Labs (all labs ordered are listed, but only abnormal results are displayed) Labs Reviewed  BASIC METABOLIC PANEL - Abnormal; Notable for the following components:      Result Value   Potassium 5.3 (*)    All other components within normal limits  CBC - Abnormal; Notable for the following components:   WBC 14.2 (*)    RBC 3.08 (*)    Hemoglobin 8.9 (*)    HCT 29.1 (*)    Platelets 637 (*)    All other components within normal limits  BRAIN NATRIURETIC PEPTIDE - Abnormal; Notable for the following components:   B Natriuretic Peptide 499.0 (*)    All other components within normal limits  BLOOD GAS, ARTERIAL - Abnormal; Notable for the following components:   pCO2 arterial 49.4 (*)    pO2, Arterial 116 (*)    All other components within normal limits  I-STAT TROPONIN, ED    EKG EKG Interpretation  Date/Time:  Saturday April 21 2018 08:00:30 EDT Ventricular Rate:  80 PR Interval:    QRS Duration: 81 QT Interval:  354 QTC Calculation: 409 R Axis:   98 Text Interpretation:  Sinus or ectopic atrial rhythm Right axis deviation Low voltage, precordial leads Abnormal lateral Q waves Probable anteroseptal infarct, old Since last tracing rate slower Confirmed by Noemi Chapel [phone]) on 04/21/2018 8:27:45 AM   Radiology Dg Chest Port 1 View  Result Date: 04/21/2018 CLINICAL DATA:  Shortness of breath. EXAM: PORTABLE CHEST 1 VIEW COMPARISON:  04/10/2018, 10/14/2016 and chest CT dated 11/25/2016. FINDINGS: Previously noted  situs inversus. The cardiac silhouette is currently enlarged with a mild increase in size. Increased prominence of the pulmonary vasculature and interstitial markings with an interval small left pleural effusion. There is some  loss of detail due to patient breathing motion. Again noted are thoracic spine degenerative changes and cervical spine fixation hardware. IMPRESSION: 1. Interval mild cardiomegaly and changes of congestive heart failure. 2. Previously noted situs inversus. Electronically Signed   By: Claudie Revering [name]   On: 04/21/2018 09:59    Procedures .Critical Care Performed by: Tacy Learn, PA-C Authorized by: Tacy Learn, PA-C   Critical care provider statement:    Critical care time (minutes):  35   Critical care was necessary to treat or prevent imminent or life-threatening deterioration of the following conditions:  Respiratory failure   Critical care was time spent personally by me on the following activities:  Development of treatment plan with patient or surrogate, discussions with consultants, evaluation of patient's response to treatment, examination of patient, interpretation of cardiac output measurements, obtaining history from patient or surrogate, ordering and performing treatments and interventions, ordering and review of laboratory studies, ordering and review of radiographic studies, pulse oximetry, re-evaluation of patient's condition and review of old charts   I assumed direction of critical care for this patient from another provider in my specialty: no     (including critical care time)  Medications Ordered in ED Medications  ipratropium-albuterol (DUONEB) 0.5-2.5 (3) MG/3ML nebulizer solution 3 mL (3 mLs Nebulization Not Given 04/21/18 0842)  furosemide (LASIX) injection 40 mg (has no administration in time range)  albuterol (PROVENTIL,VENTOLIN) solution continuous neb (15 mg/hr Nebulization Given 04/21/18 0842)  methylPREDNISolone sodium succinate  (SOLU-MEDROL) 125 mg/2 mL injection 125 mg (125 mg Intravenous Given 04/21/18 0844)     Initial Impression / Assessment and Plan / ED Course  I have reviewed the triage vital signs and the nursing notes.  Pertinent labs & imaging results that were available during my care of the patient were reviewed by me and considered in my medical decision making (see chart for details).  Clinical Course as of Apr 21 1048  Sat Apr 21, 2018  0915 67yo male with history of CHF, Kartagener syndrome, insulin dependent diabetes presents with [facility] and chest tightness. Symptoms started prior to his 04/10/18 admission, persisted after dc and worse today. No relief with his 3L [place] at home or his inhalers (states he can't get anything out of the inhaler). On exam, patient speaking in short sentences, tachypneic, pale, diminished lung sounds, mild edema bilateral lower extremities. Patient also seen by Dr. Sabra Heck, ER attending. Given duoneb followed by continuous albuterol neb x 1 hour, solumedrol 125mg . After nebs, patient with slight improvement, still slightly tachypenic, requested change to non rebreather from [place]. BNP increased from dc BNP, IV lasix ordered and consult to hospitalist for admission.   [LM]  0917 EKG completed at 0800 hrs sinus rhythm,r ate 80, no acute ST/T wave changes.  Review of labs- CBC with slight increase in WBC compared to dc labs. Hgb 8.9, known anemia, occult test neg at last admission. BMP with mildly increased K, normal renal function. I-stat trop negative. ABG completed on O2 during neb. BNP increased to 499 today, previously 231 04/10/18   [LM]  0954 Patient feeling slightly improved with neb treatment, remains tachypneic.   [LM]  1023 Discussed results with patient and wife, patient agreeable to admission. Will change from [place] to non rebreather, O2 on 3L  upper 80%s.    [LM]  1043 Case discussed with hospitalist who will see the patient.    [LM]    Clinical Course User Index [LM]  Tacy Learn, PA-C  Final Clinical Impressions(s) / ED Diagnoses   Final diagnoses:  Respiratory distress  Acute congestive heart failure, unspecified heart failure type [facility])    ED Discharge Orders    None       Tacy Learn, PA-C 04/21/18 1049    Noemi Chapel, MD 04/22/18 1529

## 2018-04-21 NOTE — ED Triage Notes (Signed)
Patient c/o extreme shortness of breath with exertion. Patient states started 2-3 days ago and is progressively getting worse. Patient states that he went to PCP and told them but nothing was done. Patient recently admitted for pneumonia and tachycardia. Patient discharged on Sunday. Patient also report neck and back pain with intermittent chest pain with breathing. Patient supposed to wear oxygen via [place] (3 liters)

## 2018-04-22 ENCOUNTER — Encounter (HOSPITAL_COMMUNITY): Payer: Self-pay

## 2018-04-22 ENCOUNTER — Inpatient Hospital Stay (HOSPITAL_COMMUNITY): Payer: 59

## 2018-04-22 LAB — BASIC METABOLIC PANEL
ANION GAP: 11 (ref 5–15)
BUN: 24 mg/dL — ABNORMAL HIGH (ref 8–23)
CALCIUM: 9.3 mg/dL (ref 8.9–10.3)
CO2: 27 mmol/L (ref 22–32)
Chloride: 95 mmol/L — ABNORMAL LOW (ref 98–111)
Creatinine, Ser: 0.78 mg/dL (ref 0.61–1.24)
GLUCOSE: 169 mg/dL — AB (ref 70–99)
Potassium: 4.9 mmol/L (ref 3.5–5.1)
Sodium: 133 mmol/L — ABNORMAL LOW (ref 135–145)

## 2018-04-22 LAB — GLUCOSE, CAPILLARY
GLUCOSE-CAPILLARY: 232 mg/dL — AB (ref 70–99)
Glucose-Capillary: 231 mg/dL — ABNORMAL HIGH (ref 70–99)
Glucose-Capillary: 129 mg/dL — ABNORMAL HIGH (ref 70–99)
Glucose-Capillary: 206 mg/dL — ABNORMAL HIGH (ref 70–99)

## 2018-04-22 MED ORDER — IOPAMIDOL (ISOVUE-300) INJECTION 61%: 75.0000 mL | Freq: Once | INTRAVENOUS | Status: DC | PRN

## 2018-04-22 MED ORDER — IPRATROPIUM-ALBUTEROL 0.5-2.5 (3) MG/3ML IN SOLN
3.0000 mL | Freq: Four times a day (QID) | RESPIRATORY_TRACT | Status: DC
  Administered 2018-04-22 – 2018-04-24 (×7): 3 mL via RESPIRATORY_TRACT
  Filled 2018-04-22 (×7): qty 3

## 2018-04-22 MED ORDER — IOHEXOL 300 MG/ML  SOLN
75.0000 mL | Freq: Once | INTRAMUSCULAR | Status: AC | PRN
  Administered 2018-04-22: 75 mL via INTRAVENOUS

## 2018-04-22 NOTE — Progress Notes (Signed)
PROGRESS NOTE    Brett [name]  OQH:[id number] DOB: May 05, 1951 DOA: 04/21/2018 PCP: [name], MD    Brief Narrative:  67 year old male with a history of Kartagener's syndrome, diabetes, CHF, was recently discharged from hospital after being treated for CHF exacerbation and pneumonia.  He was discharged home on supplemental oxygen.  He had progressive worsening of shortness of breath since discharge and was noted to be hypoxic on nasal cannula when he returned to the emergency room.  Patient had initially been placed on partial rebreather mask.  Imaging indicated persistent CHF and has been started on intravenous Lasix.  He also is on dilators and steroids.  Pulmonology following and CT his chest has been ordered for further evaluation.   Assessment & Plan:   Active Problems:   Insulin-requiring or dependent type II diabetes mellitus (HCC)   Kartagener syndrome   Acute on chronic diastolic CHF (congestive heart failure) (HCC)   Acute on chronic respiratory failure with hypoxia (HCC)   COPD with acute exacerbation ([place])   1. Acute on chronic respiratory failure with hypoxia.  Initially required partial rebreather due to hypoxia on nasal cannula.  Suspect this is related to CHF exacerbation versus bronchiectasis due to Kartagener's syndrome.  He is on pulmonary hygiene.  Pulmonology following, appreciate input.  Plan is for CT scan of chest for further evaluation today. 2. Acute on chronic diastolic congestive heart failure.  Currently on intravenous Lasix.  Reports good urine output.  Feels that breathing is improving.  We will continue current treatments. 3. COPD versus bronchiectasis.  Continue bronchodilators and intravenous steroids at this time.  CT scan has been ordered and depending on those results, may need antibiotics.  Pulmonology following. 4. Diabetes.  Holding oral agents.  On sliding scale insulin.  Blood sugars elevated likely due to steroids.  Continue to monitor. 5. GERD.   Continue on PPI 6. Kartagener's syndrome   DVT prophylaxis: Lovenox Code Status: Full code Family Communication: Discussed with wife at the bedside Disposition Plan: Discharge home once improved, can consider transfer to medical floor if respiratory status remains stable on nasal cannula.   Consultants:   Pulmonology  Procedures:     Antimicrobials:      Subjective: Reports that his breathing is improving today.  Cough is still nonproductive.  Objective: Vitals:   04/22/18 0300 04/22/18 0400 04/22/18 0416 04/22/18 1014  BP:  (!) 143/77    Pulse: 74 76    Resp:  18    Temp:   98 F (36.7 C)   TempSrc:   Oral   SpO2: 96% (!) 74%  96%  Weight:   116.9 kg   Height:        Intake/Output Summary (Last 24 hours) at 04/22/2018 1037 Last data filed at 04/22/2018 0829 Gross per 24 hour  Intake 1403 ml  Output 1675 ml  Net -272 ml   Filed Weights   04/21/18 0806 04/21/18 1134 04/22/18 0416  Weight: 117.9 kg 118.4 kg 116.9 kg    Examination:  General exam: Appears calm and comfortable  Respiratory system: mild wheeze with rhonchi bilaterally. Respiratory effort normal. Cardiovascular system: S1 & S2 heard, RRR. No JVD, murmurs, rubs, gallops or clicks. 1+ pedal edema. Gastrointestinal system: Abdomen is nondistended, soft and nontender. No organomegaly or masses felt. Normal bowel sounds heard. Central nervous system: Alert and oriented. No focal neurological deficits. Extremities: Symmetric 5 x 5 power. Skin: No rashes, lesions or ulcers Psychiatry: Judgement and insight appear normal. Mood &  affect appropriate.     Data Reviewed: I have personally reviewed following labs and imaging studies  CBC: Recent Labs  Lab 04/21/18 0846  WBC 14.2*  HGB 8.9*  HCT 29.1*  MCV 94.5  PLT 240*   Basic Metabolic Panel: Recent Labs  Lab 04/21/18 0846 04/22/18 0405  NA 138 133*  K 5.3* 4.9  CL 100 95*  CO2 29 27  GLUCOSE 96 169*  BUN 18 24*  CREATININE 0.88  0.78  CALCIUM 9.6 9.3   GFR: Estimated Creatinine Clearance: 114.3 mL/min (by C-G formula based on SCr of 0.78 mg/dL). Liver Function Tests: No results for input(s): AST, ALT, ALKPHOS, BILITOT, PROT, ALBUMIN in the last 168 hours. No results for input(s): LIPASE, AMYLASE in the last 168 hours. No results for input(s): AMMONIA in the last 168 hours. Coagulation Profile: No results for input(s): INR, PROTIME in the last 168 hours. Cardiac Enzymes: No results for input(s): CKTOTAL, CKMB, CKMBINDEX, TROPONINI in the last 168 hours. BNP (last 3 results) No results for input(s): PROBNP in the last 8760 hours. HbA1C: No results for input(s): HGBA1C in the last 72 hours. CBG: Recent Labs  Lab 04/15/18 1059 04/21/18 1134 04/21/18 1640 04/21/18 2049 04/22/18 0827  GLUCAP 144* 115* 301* 246* 231*   Lipid Profile: No results for input(s): CHOL, HDL, LDLCALC, TRIG, CHOLHDL, LDLDIRECT in the last 72 hours. Thyroid Function Tests: No results for input(s): TSH, T4TOTAL, FREET4, T3FREE, THYROIDAB in the last 72 hours. Anemia Panel: No results for input(s): VITAMINB12, FOLATE, FERRITIN, TIBC, IRON, RETICCTPCT in the last 72 hours. Sepsis Labs: No results for input(s): PROCALCITON, LATICACIDVEN in the last 168 hours.  Recent Results (from the past 240 hour(s))  MRSA PCR Screening     Status: None   Collection Time: 04/21/18 11:37 AM  Result Value Ref Range Status   MRSA by PCR NEGATIVE NEGATIVE Final    Comment:        The GeneXpert MRSA Assay (FDA approved for NASAL specimens only), is one component of a comprehensive MRSA colonization surveillance program. It is not intended to diagnose MRSA infection nor to guide or monitor treatment for MRSA infections. Performed at [facility], [address] [place], [place] [address]          Radiology Studies: Dg Chest [facility] 1 View  Result Date: 04/21/2018 CLINICAL DATA:  Shortness of breath. EXAM: PORTABLE CHEST 1 VIEW COMPARISON:   04/10/2018, 10/14/2016 and chest CT dated 11/25/2016. FINDINGS: Previously noted situs inversus. The cardiac silhouette is currently enlarged with a mild increase in size. Increased prominence of the pulmonary vasculature and interstitial markings with an interval small left pleural effusion. There is some loss of detail due to patient breathing motion. Again noted are thoracic spine degenerative changes and cervical spine fixation hardware. IMPRESSION: 1. Interval mild cardiomegaly and changes of congestive heart failure. 2. Previously noted situs inversus. Electronically Signed   By: Claudie Revering [name]   On: 04/21/2018 09:59        Scheduled Meds: . DULoxetine  60 mg Oral Daily  . enoxaparin (LOVENOX) injection  40 mg Subcutaneous Q24H  . furosemide  40 mg Intravenous BID  . gabapentin  600 mg Oral TID  . guaiFENesin  1,200 mg Oral BID  . insulin aspart  0-20 Units Subcutaneous TID WC  . insulin aspart  0-5 Units Subcutaneous QHS  . insulin detemir  15 Units Subcutaneous Q breakfast  . insulin detemir  60 Units Subcutaneous QHS  . ipratropium-albuterol  3 mL Nebulization Q6H WA  . losartan  100 mg Oral Daily  . methylPREDNISolone (SOLU-MEDROL) injection  60 mg Intravenous Q12H  . metoprolol tartrate  12.5 mg Oral BID  . mometasone-formoterol  2 puff Inhalation BID  . pantoprazole  40 mg Oral Daily  . rOPINIRole  1 mg Oral QHS  . rosuvastatin  40 mg Oral QPM  . sodium chloride flush  3 mL Intravenous Q12H  . sodium chloride HYPERTONIC  4 mL Nebulization TID   Continuous Infusions: . sodium chloride       LOS: 1 day    Time spent: 32mins    Kathie Dike, MD [facility] Pager [phone]  If 7PM-7AM, please contact night-coverage [email or web address] Password [facility] 04/22/2018, 10:37 AM

## 2018-04-22 NOTE — Consult Note (Signed)
Consult requested by: [facility], Dr. Roderic Palau Consult requested for: Respiratory failure  HPI: This is a 67 year old with history of Kartagener syndrome, diabetes, chronic diastolic heart failure, asthma versus COPD and recent episode of community acquired pneumonia as well as decompensated congestive heart failure.  He was discharged home on 3 L of oxygen.  He initially did well then had increasing shortness of breath over the last several days.  He has had increasing edema.  He has been taking his diuretics.  He has been congested but he has not been able to cough anything up.  He says he feels tight in his chest.  His oxygen saturation was low and he was brought to the emergency department.  When he came to the emergency room he was hypoxic and tachypneic on 3 L.  He had significant swelling of his legs and had decompensated congestive heart failure on chest x-ray.  BNP was elevated.  He had been placed on a partial nonrebreather mask with reasonable oxygenation.  He denies abdominal pain nausea vomiting diarrhea headache he has had some problems he says with allergies and chronic drainage from his head and he just does not have much energy.  Past Medical History:  Diagnosis Date  . Depression   . Dextrocardia   . Diabetes mellitus   . H. pylori infection 11/09/2012   treated with pylera  . HTN (hypertension)    Cholesterol  . Iron deficiency anemia, unspecified 10/18/2012  . Neuropathy   . Pneumonia   . Tachycardia      Family History  Problem Relation Age of Onset  . Diabetes Sister        Fx  . Heart defect Sister        Fx  . Arthritis Sister        Fx  . Asthma Sister        Fx  . Kidney disease Sister        Fx  . Pancreatitis Sister      Social History   Socioeconomic History  . Marital status: Married    Spouse name: Not on file  . Number of children: 0  . Years of education: 7th grade   . Highest education level: Not on file  Occupational History  .  Occupation: Lexicographer  . Financial resource strain: Not on file  . Food insecurity:    Worry: Not on file    Inability: Not on file  . Transportation needs:    Medical: Not on file    Non-medical: Not on file  Tobacco Use  . Smoking status: Never Smoker  . Smokeless tobacco: Current User    Types: Chew  Substance and Sexual Activity  . Alcohol use: No  . Drug use: No  . Sexual activity: Not on file  Lifestyle  . Physical activity:    Days per week: Not on file    Minutes per session: Not on file  . Stress: Not on file  Relationships  . Social connections:    Talks on phone: Not on file    Gets together: Not on file    Attends religious service: Not on file    Active member of club or organization: Not on file    Attends meetings of clubs or organizations: Not on file    Relationship status: Not on file  Other Topics Concern  . Not on file  Social History Narrative  . Not on file  ROS: Except as mentioned 10 point review of systems is negative    Objective: Vital signs in last 24 hours: Temp:  [97.6 F (36.4 C)-98.2 F (36.8 C)] 98 F (36.7 C) (08/11 0416) Pulse Rate:  [69-95] 76 (08/11 0400) Resp:  [18-26] 18 (08/11 0400) BP: (135-169)/(52-77) 143/77 (08/11 0400) SpO2:  [74 %-100 %] 96 % (08/11 1014) Weight:  [116.9 kg-118.4 kg] 116.9 kg (08/11 0416) Weight change:  Last BM Date: 04/22/18  Intake/Output from previous day: 08/10 0701 - 08/11 0700 In: 9485 [P.O.:1400; I.V.:3] Out: 1225 [Urine:1225]  PHYSICAL EXAM Constitutional: He is an obese male awake and alert in no acute distress.  Eyes, pupils react EOMI.  Ears nose mouth and throat: He has no tenderness in the sinuses.  His throat is clear.  Hearing is grossly normal.  He has some hoarseness.  Cardiovascular: His heart is regular with normal heart sounds.  He has situs inversus.  Respiratory: His respiratory effort is increased.  He has minimal wheeze bilaterally.  Gastrointestinal:  His abdomen is soft with no masses.  Skin: Warm and dry.  Musculoskeletal: Normal strength bilaterally.  Neurological: No focal defects.  Psychiatric: He is somewhat anxious  Lab Results: Basic Metabolic Panel: Recent Labs    04/21/18 0846 04/22/18 0405  NA 138 133*  K 5.3* 4.9  CL 100 95*  CO2 29 27  GLUCOSE 96 169*  BUN 18 24*  CREATININE 0.88 0.78  CALCIUM 9.6 9.3   Liver Function Tests: No results for input(s): AST, ALT, ALKPHOS, BILITOT, PROT, ALBUMIN in the last 72 hours. No results for input(s): LIPASE, AMYLASE in the last 72 hours. No results for input(s): AMMONIA in the last 72 hours. CBC: Recent Labs    04/21/18 0846  WBC 14.2*  HGB 8.9*  HCT 29.1*  MCV 94.5  PLT 637*   Cardiac Enzymes: No results for input(s): CKTOTAL, CKMB, CKMBINDEX, TROPONINI in the last 72 hours. BNP: No results for input(s): PROBNP in the last 72 hours. D-Dimer: No results for input(s): DDIMER in the last 72 hours. CBG: Recent Labs    04/21/18 1134 04/21/18 1640 04/21/18 2049 04/22/18 0827  GLUCAP 115* 301* 246* 231*   Hemoglobin A1C: No results for input(s): HGBA1C in the last 72 hours. Fasting Lipid Panel: No results for input(s): CHOL, HDL, LDLCALC, TRIG, CHOLHDL, LDLDIRECT in the last 72 hours. Thyroid Function Tests: No results for input(s): TSH, T4TOTAL, FREET4, T3FREE, THYROIDAB in the last 72 hours. Anemia Panel: No results for input(s): VITAMINB12, FOLATE, FERRITIN, TIBC, IRON, RETICCTPCT in the last 72 hours. Coagulation: No results for input(s): LABPROT, INR in the last 72 hours. Urine Drug Screen: Drugs of Abuse  No results found for: LABOPIA, COCAINSCRNUR, LABBENZ, AMPHETMU, THCU, LABBARB  Alcohol Level: No results for input(s): ETH in the last 72 hours. Urinalysis: No results for input(s): COLORURINE, LABSPEC, PHURINE, GLUCOSEU, HGBUR, BILIRUBINUR, KETONESUR, PROTEINUR, UROBILINOGEN, NITRITE, LEUKOCYTESUR in the last 72 hours.  Invalid input(s):  APPERANCEUR Misc. Labs:   ABGS: Recent Labs    04/21/18 0850  PHART 7.351  PO2ART 116*  HCO3 25.4     MICROBIOLOGY: Recent Results (from the past 240 hour(s))  MRSA PCR Screening     Status: None   Collection Time: 04/21/18 11:37 AM  Result Value Ref Range Status   MRSA by PCR NEGATIVE NEGATIVE Final    Comment:        The GeneXpert MRSA Assay (FDA approved for NASAL specimens only), is one component of a comprehensive MRSA  colonization surveillance program. It is not intended to diagnose MRSA infection nor to guide or monitor treatment for MRSA infections. Performed at [facility], [address], [place], [place] [address]     Studies/Results: Dg Chest Port 1 View  Result Date: 04/21/2018 CLINICAL DATA:  Shortness of breath. EXAM: PORTABLE CHEST 1 VIEW COMPARISON:  04/10/2018, 10/14/2016 and chest CT dated 11/25/2016. FINDINGS: Previously noted situs inversus. The cardiac silhouette is currently enlarged with a mild increase in size. Increased prominence of the pulmonary vasculature and interstitial markings with an interval small left pleural effusion. There is some loss of detail due to patient breathing motion. Again noted are thoracic spine degenerative changes and cervical spine fixation hardware. IMPRESSION: 1. Interval mild cardiomegaly and changes of congestive heart failure. 2. Previously noted situs inversus. Electronically Signed   By: Claudie Revering [name]   On: 04/21/2018 09:59    Medications:  Prior to Admission:  Medications Prior to Admission  Medication Sig Dispense Refill Last Dose  . albuterol (PROAIR HFA) 108 (90 Base) MCG/ACT inhaler Inhale 2 puffs into the lungs every 4 (four) hours as needed for wheezing or shortness of breath. 1 Inhaler 2 04/21/2018 at Unknown time  . azelastine (ASTELIN) 0.1 % nasal spray Place 2 sprays into both nostrils 2 (two) times daily. Use in each nostril as directed 30 mL 5 04/20/2018 at Unknown time  . budesonide-formoterol  (SYMBICORT) 160-4.5 MCG/ACT inhaler Inhale 2 puffs into the lungs 2 (two) times daily. 1 Inhaler 5 04/21/2018 at Unknown time  . DEXILANT 60 MG capsule TAKE (1) CAPSULE BY MOUTH EVERY DAY. 90 capsule 3 04/21/2018 at Unknown time  . DULoxetine (CYMBALTA) 60 MG capsule Take 60 mg by mouth daily.   04/21/2018 at Unknown time  . folic acid (FOLVITE) 1 MG tablet Take 1 mg by mouth daily.   04/21/2018 at Unknown time  . furosemide (LASIX) 20 MG tablet Take 1 tablet (20 mg total) by mouth daily. 30 tablet 1 04/21/2018 at Unknown time  . gabapentin (NEURONTIN) 600 MG tablet Take 1 tablet (600 mg total) by mouth 2 (two) times daily. (Patient taking differently: Take 600 mg by mouth 3 (three) times daily. ) 20 tablet 0 04/21/2018 at Unknown time  . insulin detemir (LEVEMIR) 100 UNIT/ML injection Inject 15-60 Units into the skin 2 (two) times daily. 15 units in the am and 60 units at qhs   04/20/2018 at Unknown time  . losartan (COZAAR) 100 MG tablet Take 100 mg by mouth daily.   04/21/2018 at Unknown time  . metFORMIN (GLUCOPHAGE) 1000 MG tablet Take 1,000 mg by mouth 2 (two) times daily with a meal.   04/21/2018 at Unknown time  . metoprolol tartrate (LOPRESSOR) 25 MG tablet Take 0.5 tablets (12.5 mg total) by mouth 2 (two) times daily. 60 tablet 1 04/21/2018 at 0800  . Multiple Vitamin (MULTIVITAMIN) tablet Take 1 tablet by mouth daily.   04/21/2018 at Unknown time  . pioglitazone (ACTOS) 45 MG tablet Take 45 mg by mouth daily.   04/21/2018 at Unknown time  . rOPINIRole (REQUIP) 1 MG tablet Take 1 mg by mouth at bedtime.   04/20/2018 at Unknown time  . rosuvastatin (CRESTOR) 40 MG tablet Take 1 tablet by mouth every evening.   04/20/2018 at Unknown time  . sitaGLIPtin (JANUVIA) 100 MG tablet Take 100 mg by mouth daily.    04/21/2018 at Unknown time  . traMADol (ULTRAM) 50 MG tablet Take 1 tablet (50 mg total) by mouth every  6 (six) hours as needed. (Patient not taking: Reported on 04/21/2018) 10 tablet 0 Not Taking at Unknown  time   Scheduled: . DULoxetine  60 mg Oral Daily  . enoxaparin (LOVENOX) injection  40 mg Subcutaneous Q24H  . furosemide  40 mg Intravenous BID  . gabapentin  600 mg Oral TID  . guaiFENesin  1,200 mg Oral BID  . insulin aspart  0-20 Units Subcutaneous TID WC  . insulin aspart  0-5 Units Subcutaneous QHS  . insulin detemir  15 Units Subcutaneous Q breakfast  . insulin detemir  60 Units Subcutaneous QHS  . ipratropium-albuterol  3 mL Nebulization Q6H WA  . losartan  100 mg Oral Daily  . methylPREDNISolone (SOLU-MEDROL) injection  60 mg Intravenous Q12H  . metoprolol tartrate  12.5 mg Oral BID  . mometasone-formoterol  2 puff Inhalation BID  . pantoprazole  40 mg Oral Daily  . rOPINIRole  1 mg Oral QHS  . rosuvastatin  40 mg Oral QPM  . sodium chloride flush  3 mL Intravenous Q12H  . sodium chloride HYPERTONIC  4 mL Nebulization TID   Continuous: . sodium chloride     WUX:LKGMWN chloride, acetaminophen, albuterol, LORazepam, ondansetron (ZOFRAN) IV, sodium chloride flush  Assesment: He has acute on chronic hypoxic respiratory failure.  He is now on 4 L high flow nasal cannula and is 96% saturated so that seems better.  He has acute on chronic diastolic heart failure and he is down 272 mL since admission.  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  He has significant secondhand smoke exposure and may have some COPD from that but his problem may be from the bronchiectasis  He had pneumonia last month and earlier this month and that is not seen on his chest x-ray.  He has diabetes on insulin  He has obesity Active Problems:   Insulin-requiring or dependent type II diabetes mellitus (HCC)   Kartagener syndrome   Acute on chronic diastolic CHF (congestive heart failure) (HCC)   Acute on chronic respiratory failure with hypoxia (HCC)   COPD with acute exacerbation ([place])    Plan: I think a CT of the chest will help [place]  to see exactly what is going on.  He will have that done.  I am going to see if we can get him started with a flutter valve.  He may need antibiotics depending on the results of the CT.    LOS: 1 day   Azlan Hanway L 04/22/2018, 10:22 AM

## 2018-04-23 DIAGNOSIS — J441 Chronic obstructive pulmonary disease with (acute) exacerbation: Secondary | ICD-10-CM

## 2018-04-23 DIAGNOSIS — I5033 Acute on chronic diastolic (congestive) heart failure: Secondary | ICD-10-CM

## 2018-04-23 LAB — GLUCOSE, CAPILLARY
GLUCOSE-CAPILLARY: 149 mg/dL — AB (ref 70–99)
GLUCOSE-CAPILLARY: 126 mg/dL — AB (ref 70–99)
Glucose-Capillary: 201 mg/dL — ABNORMAL HIGH (ref 70–99)
Glucose-Capillary: 153 mg/dL — ABNORMAL HIGH (ref 70–99)

## 2018-04-23 LAB — BASIC METABOLIC PANEL
ANION GAP: 11 (ref 5–15)
BUN: 24 mg/dL — ABNORMAL HIGH (ref 8–23)
CO2: 32 mmol/L (ref 22–32)
Calcium: 9.6 mg/dL (ref 8.9–10.3)
Chloride: 96 mmol/L — ABNORMAL LOW (ref 98–111)
Creatinine, Ser: 0.83 mg/dL (ref 0.61–1.24)
GFR calc Af Amer: >60 mL/min (ref >60)
GFR calc non Af Amer: >60 mL/min (ref >60)
GLUCOSE: 107 mg/dL — AB (ref 70–99)
POTASSIUM: 4.5 mmol/L (ref 3.5–5.1)
Sodium: 139 mmol/L (ref 135–145)

## 2018-04-23 LAB — HIV ANTIBODY (ROUTINE TESTING W REFLEX): HIV Screen 4th Generation wRfx: NONREACTIVE

## 2018-04-23 MED ORDER — METHYLPREDNISOLONE SODIUM SUCC 40 MG IJ SOLR
40.0000 mg | Freq: Two times a day (BID) | INTRAMUSCULAR | Status: DC
  Administered 2018-04-23 – 2018-04-24 (×2): 40 mg via INTRAVENOUS
  Filled 2018-04-23 (×2): qty 1

## 2018-04-23 MED ORDER — INSULIN ASPART 100 UNIT/ML ~~LOC~~ SOLN
0.0000 [IU] | Freq: Three times a day (TID) | SUBCUTANEOUS | Status: DC
  Administered 2018-04-23 (×2): 3 [IU] via SUBCUTANEOUS

## 2018-04-23 NOTE — Care Management Note (Signed)
Case Management Note  Patient Details  Name: BODEY FRIZELL MRN: [id number] Date of Birth: 31-Dec-1950  Subjective/Objective:   Acute on chronic respiratory failure with hypoxia, CHF. He has Kartagener syndrome.  From home, independent. Goes to [facility] for primary care. Recently here for same, discharged just a week ago. Discharged home with oxygen [facility]).    Action/Plan:  CM following for needs. Patient not eligible for ReDS vest program for CHF management with [facility] as he is not on the [facility] registry. ? Patient may need/ benefit from  [facility] RN.   Expected Discharge Date:  04/23/18               Expected Discharge Plan:     In-House Referral:     Discharge planning Services   CM consult  Post Acute Care Choice:    Choice offered to:     DME Arranged:    DME Agency:     HH Arranged:    HH Agency:     Status of Service:   in progress, will continue to follow.  If discussed at [place] of Stay Meetings, dates discussed:    Additional Comments:  Avaya Mcjunkins, Chauncey [place], RN 04/23/2018, 2:25 PM

## 2018-04-23 NOTE — Progress Notes (Signed)
PROGRESS NOTE    Brett [name]  JQB:[id number] DOB: [birth year] DOA: 04/21/2018 PCP: [name], MD   Brief Narrative:   67 year old male with a history of Kartagener's syndrome, diabetes, CHF, was recently discharged from hospital after being treated for CHF exacerbation and pneumonia.  He was discharged home on supplemental oxygen.  He had progressive worsening of shortness of breath since discharge and was noted to be hypoxic on nasal cannula when he returned to the emergency room.  Patient had initially been placed on partial rebreather mask.  Imaging indicated persistent CHF and has been started on intravenous Lasix.  He also is on dilators and steroids.  Pulmonology following with chest CT ordered on 8/11 with chronic lung findings and some atelectasis.  Assessment & Plan:   Active Problems:   Insulin-requiring or dependent type II diabetes mellitus (HCC)   Kartagener syndrome   Acute on chronic diastolic CHF (congestive heart failure) (HCC)   Acute on chronic respiratory failure with hypoxia (HCC)   COPD with acute exacerbation ([place])   1. Acute on chronic respiratory failure with hypoxia-improving.  Initially required partial rebreather due to hypoxia on nasal cannula.  Suspect this is related to CHF exacerbation versus bronchiectasis due to Kartagener's syndrome.  He is on pulmonary hygiene.  Pulmonology following, appreciate recommendations.  Plan to transfer to floor today and likely discharge in a.m. 2. Acute on chronic diastolic congestive heart failure-improving.  Currently on intravenous Lasix.  Reports good urine output.  Feels that breathing is improving.  We will continue current treatments through today. 3. COPD versus bronchiectasis.  Continue bronchodilators and intravenous steroids at this time.  CT scan has been ordered and depending on those results, may need antibiotics.  Pulmonology following. 4. Diabetes.  Holding oral agents.  On sliding scale insulin.    Blood  glucose with improved control.  Likely steroid-induced hyperglycemia.   5. GERD.  Continue on PPI 6. Kartagener's syndrome   DVT prophylaxis: Lovenox Code Status: Full Family Communication:  Disposition Plan: Transfer to floor today.  Appreciate further pulmonology recommendations.  Anticipate discharge in a.m if improved.   Consultants:   None  Procedures:   None  Antimicrobials:   None   Subjective: Patient seen and evaluated today with no new acute complaints or concerns. No acute concerns or events noted overnight.  Objective: Vitals:   04/23/18 0400 04/23/18 0500 04/23/18 0812 04/23/18 0819  BP: (!) 108/45     Pulse: 65     Resp: 16     Temp: 98 F (36.7 C)     TempSrc: Oral     SpO2: 95%  98% 98%  Weight:  115.2 kg    Height:        Intake/Output Summary (Last 24 hours) at 04/23/2018 4097 Last data filed at 04/22/2018 2200 Gross per 24 hour  Intake 3 ml  Output 3975 ml  Net -3972 ml   Filed Weights   04/22/18 0416 04/22/18 1700 04/23/18 0500  Weight: 116.9 kg 87.1 kg 115.2 kg    Examination:  General exam: Appears calm and comfortable  Respiratory system: Clear to auscultation. Respiratory effort normal. On [place]. Cardiovascular system: S1 & S2 heard, RRR. No JVD, murmurs, rubs, gallops or clicks. No pedal edema. Gastrointestinal system: Abdomen is nondistended, soft and nontender. No organomegaly or masses felt. Normal bowel sounds heard. Central nervous system: Alert and oriented. No focal neurological deficits. Extremities: Symmetric 5 x 5 power. Skin: No rashes, lesions or ulcers Psychiatry: Judgement and  insight appear normal. Mood & affect appropriate.     Data Reviewed: I have personally reviewed following labs and imaging studies  CBC: Recent Labs  Lab 04/21/18 0846  WBC 14.2*  HGB 8.9*  HCT 29.1*  MCV 94.5  PLT 831*   Basic Metabolic Panel: Recent Labs  Lab 04/21/18 0846 04/22/18 0405 04/23/18 0439  NA 138 133* 139  K 5.3*  4.9 4.5  CL 100 95* 96*  CO2 29 27 32  GLUCOSE 96 169* 107*  BUN 18 24* 24*  CREATININE 0.88 0.78 0.83  CALCIUM 9.6 9.3 9.6   GFR: Estimated Creatinine Clearance: 109.3 mL/min (by C-G formula based on SCr of 0.83 mg/dL). Liver Function Tests: No results for input(s): AST, ALT, ALKPHOS, BILITOT, PROT, ALBUMIN in the last 168 hours. No results for input(s): LIPASE, AMYLASE in the last 168 hours. No results for input(s): AMMONIA in the last 168 hours. Coagulation Profile: No results for input(s): INR, PROTIME in the last 168 hours. Cardiac Enzymes: No results for input(s): CKTOTAL, CKMB, CKMBINDEX, TROPONINI in the last 168 hours. BNP (last 3 results) No results for input(s): PROBNP in the last 8760 hours. HbA1C: No results for input(s): HGBA1C in the last 72 hours. CBG: Recent Labs  Lab 04/21/18 2049 04/22/18 0827 04/22/18 1138 04/22/18 1605 04/22/18 2103  GLUCAP 246* 231* 129* 232* 206*   Lipid Profile: No results for input(s): CHOL, HDL, LDLCALC, TRIG, CHOLHDL, LDLDIRECT in the last 72 hours. Thyroid Function Tests: No results for input(s): TSH, T4TOTAL, FREET4, T3FREE, THYROIDAB in the last 72 hours. Anemia Panel: No results for input(s): VITAMINB12, FOLATE, FERRITIN, TIBC, IRON, RETICCTPCT in the last 72 hours. Sepsis Labs: No results for input(s): PROCALCITON, LATICACIDVEN in the last 168 hours.  Recent Results (from the past 240 hour(s))  MRSA PCR Screening     Status: None   Collection Time: 04/21/18 11:37 AM  Result Value Ref Range Status   MRSA by PCR NEGATIVE NEGATIVE Final    Comment:        The GeneXpert MRSA Assay (FDA approved for NASAL specimens only), is one component of a comprehensive MRSA colonization surveillance program. It is not intended to diagnose MRSA infection nor to guide or monitor treatment for MRSA infections. Performed at [facility], [address], [place], [place] [address]          Radiology Studies: Ct Chest W  Contrast  Result Date: 04/22/2018 CLINICAL DATA:  Dyspnea. Pt had recent dx of CHF ,pneumonia. Worsening after discharge. Hx of Kartagener syndrome EXAM: CT CHEST WITH CONTRAST TECHNIQUE: Multidetector CT imaging of the chest was performed during intravenous contrast administration. CONTRAST:  73mL OMNIPAQUE IOHEXOL 300 MG/ML  SOLN COMPARISON:  Chest radiograph, 04/21/2018.  Chest CT, 11/25/2016. 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 Heart is normal in size. No pericardial effusion. Mild left coronary artery calcifications. Great vessels are normal in caliber. Minor aortic atherosclerosis. No dissection. Mediastinum/Nodes: No neck base or axillary masses or pathologically enlarged lymph nodes. Several prominent mediastinal lymph nodes, largest a prevascular node measuring 1 cm short axis. No mediastinal or hilar masses. No enlarged hilar lymph nodes. Lungs/Pleura: Small bilateral pleural effusions. Bilateral interstitial thickening. There is hazy ground-glass opacity in both lungs. Bronchial wall thickening is noted in the lower lobes. There is consolidation in the lower lobes with associated volume loss, consistent with atelectasis. There are areas of peribronchovascular opacity in the anterior upper lobes. These findings are new since the prior CT. Focal atelectasis in  the posteromedial right lower lobe is stable from the prior CT. There is pleuroparenchymal scarring most evident at the left apex that is also unchanged. No pneumothorax. Upper Abdomen: No acute abnormality. Musculoskeletal: No fracture or acute finding. No osteoblastic or osteolytic lesions. IMPRESSION: 1. Small bilateral pleural effusions with bilateral interstitial thickening and ground-glass opacities, consistent with pulmonary edema. There are more confluent lung opacities in the lower lobes that are most likely atelectasis. Bilateral infection is not excluded but felt less likely. Aortic Atherosclerosis  (ICD10-I70.0). Electronically Signed   By: Lajean Manes [name]   On: 04/22/2018 12:32   Dg Chest Port 1 View  Result Date: 04/21/2018 CLINICAL DATA:  Shortness of breath. EXAM: PORTABLE CHEST 1 VIEW COMPARISON:  04/10/2018, 10/14/2016 and chest CT dated 11/25/2016. FINDINGS: Previously noted situs inversus. The cardiac silhouette is currently enlarged with a mild increase in size. Increased prominence of the pulmonary vasculature and interstitial markings with an interval small left pleural effusion. There is some loss of detail due to patient breathing motion. Again noted are thoracic spine degenerative changes and cervical spine fixation hardware. IMPRESSION: 1. Interval mild cardiomegaly and changes of congestive heart failure. 2. Previously noted situs inversus. Electronically Signed   By: Claudie Revering [name]   On: 04/21/2018 09:59        Scheduled Meds: . DULoxetine  60 mg Oral Daily  . enoxaparin (LOVENOX) injection  40 mg Subcutaneous Q24H  . furosemide  40 mg Intravenous BID  . gabapentin  600 mg Oral TID  . guaiFENesin  1,200 mg Oral BID  . insulin aspart  0-20 Units Subcutaneous TID WC  . insulin aspart  0-5 Units Subcutaneous QHS  . insulin detemir  15 Units Subcutaneous Q breakfast  . insulin detemir  60 Units Subcutaneous QHS  . ipratropium-albuterol  3 mL Nebulization Q6H WA  . losartan  100 mg Oral Daily  . methylPREDNISolone (SOLU-MEDROL) injection  60 mg Intravenous Q12H  . metoprolol tartrate  12.5 mg Oral BID  . mometasone-formoterol  2 puff Inhalation BID  . pantoprazole  40 mg Oral Daily  . rOPINIRole  1 mg Oral QHS  . rosuvastatin  40 mg Oral QPM  . sodium chloride flush  3 mL Intravenous Q12H  . sodium chloride HYPERTONIC  4 mL Nebulization TID   Continuous Infusions: . sodium chloride       LOS: 2 days    Time spent: 30 minutes    Pratik Darleen Crocker, DO [facility] Pager [phone]  If 7PM-7AM, please contact  night-coverage [email or web address] Password [email or web address] 04/23/2018, 8:22 AM

## 2018-04-23 NOTE — Progress Notes (Signed)
Subjective: He says he feels much better.  He remains on nasal cannula with good oxygenation.  He is down about 4 L.  He says his breathing is almost back to baseline.  Not coughing anything up. Objective: Vital signs in last 24 hours: Temp:  [98 F (36.7 C)-98.1 F (36.7 C)] 98 F (36.7 C) (08/12 0400) Pulse Rate:  [65-84] 65 (08/12 0400) Resp:  [16-19] 16 (08/12 0400) BP: (108-139)/(45-53) 108/45 (08/12 0400) SpO2:  [89 %-99 %] 98 % (08/12 0819) Weight:  [87.1 kg-115.2 kg] 115.2 kg (08/12 0500) Weight change: -30.8 kg Last BM Date: 04/22/18  Intake/Output from previous day: 08/11 0701 - 08/12 0700 In: 3 [I.V.:3] Out: 3975 [Urine:3975]  PHYSICAL EXAM General appearance: alert, cooperative and Obese Resp: rhonchi bilaterally Cardio: regular rate and rhythm, S1, S2 normal, no murmur, click, rub or gallop GI: soft, non-tender; bowel sounds normal; no masses,  no organomegaly Extremities: Still minimal edema in both legs  Lab Results:  Results for orders placed or performed during the hospital encounter of 04/21/18 (from the past 48 hour(s))  I-stat troponin, ED     Status: None   Collection Time: 04/21/18  8:36 AM  Result Value Ref Range   Troponin i, poc 0.01 0.00 - 0.08 ng/mL   Comment 3            Comment: Due to the release kinetics of cTnI, a negative result within the first hours of the onset of symptoms does not rule out myocardial infarction with certainty. If myocardial infarction is still suspected, repeat the test at appropriate intervals.   Basic metabolic panel     Status: Abnormal   Collection Time: 04/21/18  8:46 AM  Result Value Ref Range   Sodium 138 135 - 145 mmol/L   Potassium 5.3 (H) 3.5 - 5.1 mmol/L   Chloride 100 98 - 111 mmol/L   CO2 29 22 - 32 mmol/L   Glucose, Bld 96 70 - 99 mg/dL   BUN 18 8 - 23 mg/dL   Creatinine, Ser 0.88 0.61 - 1.24 mg/dL   Calcium 9.6 8.9 - 10.3 mg/dL   GFR calc non Af Amer >60 >60 mL/min   GFR calc Af Amer >60 >60  mL/min    Comment: (NOTE) The eGFR has been calculated using the CKD EPI equation. This calculation has not been validated in all clinical situations. eGFR's persistently <60 mL/min signify possible Chronic Kidney Disease.    Anion gap 9 5 - 15    Comment: Performed at [facility], [address] [place], [place] [address]  CBC     Status: Abnormal   Collection Time: 04/21/18  8:46 AM  Result Value Ref Range   WBC 14.2 (H) 4.0 - 10.5 K/uL   RBC 3.08 (L) 4.22 - 5.81 MIL/uL   Hemoglobin 8.9 (L) 13.0 - 17.0 g/dL   HCT 29.1 (L) 39.0 - 52.0 %   MCV 94.5 78.0 - 100.0 fL   MCH 28.9 26.0 - 34.0 pg   MCHC 30.6 30.0 - 36.0 g/dL   RDW 14.6 11.5 - 15.5 %   Platelets 637 (H) 150 - 400 K/uL    Comment: Performed at [facility], [address], [place], [place] [address]  Brain natriuretic peptide     Status: Abnormal   Collection Time: 04/21/18  8:46 AM  Result Value Ref Range   B Natriuretic Peptide 499.0 (H) 0.0 - 100.0 pg/mL    Comment: Performed at [facility], [place]  [address], [place], [place] [address]  Blood gas, arterial ([facility] & [facility] ONLY)     Status: Abnormal   Collection Time: 04/21/18  8:50 AM  Result Value Ref Range   O2 Content 8.0 L/min   Delivery systems OXYGEN MASK    pH, Arterial 7.351 7.350 - 7.450   pCO2 arterial 49.4 (H) 32.0 - 48.0 mmHg   pO2, Arterial 116 (H) 83.0 - 108.0 mmHg   Bicarbonate 25.4 20.0 - 28.0 mmol/L   Acid-Base Excess 1.6 0.0 - 2.0 mmol/L   O2 Saturation 97.6 %   Patient temperature 37.0    Collection site RIGHT BRACHIAL    Drawn by [phone]    Sample type ARTERIAL DRAW     Comment: Performed at [facility], [address] [place], [place] [address]  Glucose, capillary     Status: Abnormal   Collection Time: 04/21/18 11:34 AM  Result Value Ref Range   Glucose-Capillary 115 (H) 70 - 99 mg/dL  MRSA PCR Screening     Status: None   Collection Time: 04/21/18 11:37 AM  Result Value Ref Range   MRSA by PCR NEGATIVE NEGATIVE    Comment:        The  GeneXpert MRSA Assay (FDA approved for NASAL specimens only), is one component of a comprehensive MRSA colonization surveillance program. It is not intended to diagnose MRSA infection nor to guide or monitor treatment for MRSA infections. Performed at [facility], [address] [place], [place] [address]   Glucose, capillary     Status: Abnormal   Collection Time: 04/21/18  4:40 PM  Result Value Ref Range   Glucose-Capillary 301 (H) 70 - 99 mg/dL  Glucose, capillary     Status: Abnormal   Collection Time: 04/21/18  8:49 PM  Result Value Ref Range   Glucose-Capillary 246 (H) 70 - 99 mg/dL  Basic metabolic panel     Status: Abnormal   Collection Time: 04/22/18  4:05 AM  Result Value Ref Range   Sodium 133 (L) 135 - 145 mmol/L   Potassium 4.9 3.5 - 5.1 mmol/L   Chloride 95 (L) 98 - 111 mmol/L   CO2 27 22 - 32 mmol/L   Glucose, Bld 169 (H) 70 - 99 mg/dL   BUN 24 (H) 8 - 23 mg/dL   Creatinine, Ser 0.78 0.61 - 1.24 mg/dL   Calcium 9.3 8.9 - 10.3 mg/dL   GFR calc non Af Amer >60 >60 mL/min   GFR calc Af Amer >60 >60 mL/min    Comment: (NOTE) The eGFR has been calculated using the CKD EPI equation. This calculation has not been validated in all clinical situations. eGFR's persistently <60 mL/min signify possible Chronic Kidney Disease.    Anion gap 11 5 - 15    Comment: Performed at [facility], [address], [place], [place] [address]  Glucose, capillary     Status: Abnormal   Collection Time: 04/22/18  8:27 AM  Result Value Ref Range   Glucose-Capillary 231 (H) 70 - 99 mg/dL  Glucose, capillary     Status: Abnormal   Collection Time: 04/22/18 11:38 AM  Result Value Ref Range   Glucose-Capillary 129 (H) 70 - 99 mg/dL  Glucose, capillary     Status: Abnormal   Collection Time: 04/22/18  4:05 PM  Result Value Ref Range   Glucose-Capillary 232 (H) 70 - 99 mg/dL  Glucose, capillary     Status: Abnormal   Collection Time: 04/22/18  9:03 PM  Result Value  Ref Range    Glucose-Capillary 206 (H) 70 - 99 mg/dL  Basic metabolic panel     Status: Abnormal   Collection Time: 04/23/18  4:39 AM  Result Value Ref Range   Sodium 139 135 - 145 mmol/L   Potassium 4.5 3.5 - 5.1 mmol/L   Chloride 96 (L) 98 - 111 mmol/L   CO2 32 22 - 32 mmol/L   Glucose, Bld 107 (H) 70 - 99 mg/dL   BUN 24 (H) 8 - 23 mg/dL   Creatinine, Ser 0.83 0.61 - 1.24 mg/dL   Calcium 9.6 8.9 - 10.3 mg/dL   GFR calc non Af Amer >60 >60 mL/min   GFR calc Af Amer >60 >60 mL/min    Comment: (NOTE) The eGFR has been calculated using the CKD EPI equation. This calculation has not been validated in all clinical situations. eGFR's persistently <60 mL/min signify possible Chronic Kidney Disease.    Anion gap 11 5 - 15    Comment: Performed at [facility], [address], [place], [place] [address]    ABGS Recent Labs    04/21/18 0850  PHART 7.351  PO2ART 116*  HCO3 25.4   CULTURES Recent Results (from the past 240 hour(s))  MRSA PCR Screening     Status: None   Collection Time: 04/21/18 11:37 AM  Result Value Ref Range Status   MRSA by PCR NEGATIVE NEGATIVE Final    Comment:        The GeneXpert MRSA Assay (FDA approved for NASAL specimens only), is one component of a comprehensive MRSA colonization surveillance program. It is not intended to diagnose MRSA infection nor to guide or monitor treatment for MRSA infections. Performed at [facility], [address] [place], [place] [address]    Studies/Results: Ct Chest W Contrast  Result Date: 04/22/2018 CLINICAL DATA:  Dyspnea. Pt had recent dx of CHF ,pneumonia. Worsening after discharge. Hx of Kartagener syndrome EXAM: CT CHEST WITH CONTRAST TECHNIQUE: Multidetector CT imaging of the chest was performed during intravenous contrast administration. CONTRAST:  17m OMNIPAQUE IOHEXOL 300 MG/ML  SOLN COMPARISON:  Chest radiograph, 04/21/2018.  Chest CT, 11/25/2016. 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 Heart is normal in size. No pericardial effusion. Mild left coronary artery calcifications. Great vessels are normal in caliber. Minor aortic atherosclerosis. No dissection. Mediastinum/Nodes: No neck base or axillary masses or pathologically enlarged lymph nodes. Several prominent mediastinal lymph nodes, largest a prevascular node measuring 1 cm short axis. No mediastinal or hilar masses. No enlarged hilar lymph nodes. Lungs/Pleura: Small bilateral pleural effusions. Bilateral interstitial thickening. There is hazy ground-glass opacity in both lungs. Bronchial wall thickening is noted in the lower lobes. There is consolidation in the lower lobes with associated volume loss, consistent with atelectasis. There are areas of peribronchovascular opacity in the anterior upper lobes. These findings are new since the prior CT. Focal atelectasis in the posteromedial right lower lobe is stable from the prior CT. There is pleuroparenchymal scarring most evident at the left apex that is also unchanged. No pneumothorax. Upper Abdomen: No acute abnormality. Musculoskeletal: No fracture or acute finding. No osteoblastic or osteolytic lesions. IMPRESSION: 1. Small bilateral pleural effusions with bilateral interstitial thickening and ground-glass opacities, consistent with pulmonary edema. There are more confluent lung opacities in the lower lobes that are most likely atelectasis. Bilateral infection is not excluded but felt less likely. Aortic Atherosclerosis (ICD10-I70.0). Electronically Signed   By: DLajean ManesM.D.   On: 04/22/2018 12:32  Dg Chest Port 1 View  Result Date: 04/21/2018 CLINICAL DATA:  Shortness of breath. EXAM: PORTABLE CHEST 1 VIEW COMPARISON:  04/10/2018, 10/14/2016 and chest CT dated 11/25/2016. FINDINGS: Previously noted situs inversus. The cardiac silhouette is currently enlarged with a mild increase in size. Increased prominence of the pulmonary vasculature and interstitial markings with  an interval small left pleural effusion. There is some loss of detail due to patient breathing motion. Again noted are thoracic spine degenerative changes and cervical spine fixation hardware. IMPRESSION: 1. Interval mild cardiomegaly and changes of congestive heart failure. 2. Previously noted situs inversus. Electronically Signed   By: Claudie Revering [name]   On: 04/21/2018 09:59    Medications:  Prior to Admission:  Medications Prior to Admission  Medication Sig Dispense Refill Last Dose  . albuterol (PROAIR HFA) 108 (90 Base) MCG/ACT inhaler Inhale 2 puffs into the lungs every 4 (four) hours as needed for wheezing or shortness of breath. 1 Inhaler 2 04/21/2018 at Unknown time  . azelastine (ASTELIN) 0.1 % nasal spray Place 2 sprays into both nostrils 2 (two) times daily. Use in each nostril as directed 30 mL 5 04/20/2018 at Unknown time  . budesonide-formoterol (SYMBICORT) 160-4.5 MCG/ACT inhaler Inhale 2 puffs into the lungs 2 (two) times daily. 1 Inhaler 5 04/21/2018 at Unknown time  . DEXILANT 60 MG capsule TAKE (1) CAPSULE BY MOUTH EVERY DAY. 90 capsule 3 04/21/2018 at Unknown time  . DULoxetine (CYMBALTA) 60 MG capsule Take 60 mg by mouth daily.   04/21/2018 at Unknown time  . folic acid (FOLVITE) 1 MG tablet Take 1 mg by mouth daily.   04/21/2018 at Unknown time  . furosemide (LASIX) 20 MG tablet Take 1 tablet (20 mg total) by mouth daily. 30 tablet 1 04/21/2018 at Unknown time  . gabapentin (NEURONTIN) 600 MG tablet Take 1 tablet (600 mg total) by mouth 2 (two) times daily. (Patient taking differently: Take 600 mg by mouth 3 (three) times daily. ) 20 tablet 0 04/21/2018 at Unknown time  . insulin detemir (LEVEMIR) 100 UNIT/ML injection Inject 15-60 Units into the skin 2 (two) times daily. 15 units in the am and 60 units at qhs   04/20/2018 at Unknown time  . losartan (COZAAR) 100 MG tablet Take 100 mg by mouth daily.   04/21/2018 at Unknown time  . metFORMIN (GLUCOPHAGE) 1000 MG tablet Take 1,000 mg by  mouth 2 (two) times daily with a meal.   04/21/2018 at Unknown time  . metoprolol tartrate (LOPRESSOR) 25 MG tablet Take 0.5 tablets (12.5 mg total) by mouth 2 (two) times daily. 60 tablet 1 04/21/2018 at 0800  . Multiple Vitamin (MULTIVITAMIN) tablet Take 1 tablet by mouth daily.   04/21/2018 at Unknown time  . pioglitazone (ACTOS) 45 MG tablet Take 45 mg by mouth daily.   04/21/2018 at Unknown time  . rOPINIRole (REQUIP) 1 MG tablet Take 1 mg by mouth at bedtime.   04/20/2018 at Unknown time  . rosuvastatin (CRESTOR) 40 MG tablet Take 1 tablet by mouth every evening.   04/20/2018 at Unknown time  . sitaGLIPtin (JANUVIA) 100 MG tablet Take 100 mg by mouth daily.    04/21/2018 at Unknown time  . traMADol (ULTRAM) 50 MG tablet Take 1 tablet (50 mg total) by mouth every 6 (six) hours as needed. (Patient not taking: Reported on 04/21/2018) 10 tablet 0 Not Taking at Unknown time   Scheduled: . DULoxetine  60 mg Oral Daily  . enoxaparin (LOVENOX) injection  40 mg Subcutaneous Q24H  . furosemide  40 mg Intravenous BID  . gabapentin  600 mg Oral TID  . guaiFENesin  1,200 mg Oral BID  . insulin aspart  0-20 Units Subcutaneous TID WC  . insulin aspart  0-5 Units Subcutaneous QHS  . insulin detemir  15 Units Subcutaneous Q breakfast  . insulin detemir  60 Units Subcutaneous QHS  . ipratropium-albuterol  3 mL Nebulization Q6H WA  . losartan  100 mg Oral Daily  . methylPREDNISolone (SOLU-MEDROL) injection  60 mg Intravenous Q12H  . metoprolol tartrate  12.5 mg Oral BID  . mometasone-formoterol  2 puff Inhalation BID  . pantoprazole  40 mg Oral Daily  . rOPINIRole  1 mg Oral QHS  . rosuvastatin  40 mg Oral QPM  . sodium chloride flush  3 mL Intravenous Q12H  . sodium chloride HYPERTONIC  4 mL Nebulization TID   Continuous: . sodium chloride     NVB:TYOMAY chloride, acetaminophen, albuterol, LORazepam, ondansetron (ZOFRAN) IV, sodium chloride flush  Assesment: He has Kartagener syndrome.  He has  bronchiectasis from that.  His CT yesterday showed atelectasis but no definite pneumonia.  He was admitted with acute on chronic hypoxic respiratory failure and acute on chronic diastolic heart failure.  He is improving.  He shows bilateral lower lobe atelectasis. Active Problems:   Insulin-requiring or dependent type II diabetes mellitus (HCC)   Kartagener syndrome   Acute on chronic diastolic CHF (congestive heart failure) (HCC)   Acute on chronic respiratory failure with hypoxia (HCC)   COPD with acute exacerbation ([place])    Plan: Continue treatments.  I think okay to move to the floor.  He says he wants to go home but I think it is probably 24 hours from being ready to go    LOS: 2 days   Katira Dumais L 04/23/2018, 8:23 AM

## 2018-04-23 NOTE — Care Management Important Message (Signed)
Important Message  Patient Details  Name: TRISTEN LUCE MRN: [id number] Date of Birth: 06-17-1951   Medicare Important Message Given:  Yes    Shelda Altes 04/23/2018, 11:22 AM

## 2018-04-23 NOTE — Progress Notes (Signed)
Patient transported to room 341 via wheelchair with oxygen intact.  Patient oriented to room, call bell within reach, bed alarm on, oxygen at 4 L in place and working.  Patient denies pain.  Resting in bed.  Belongings transported to room with patient.  IV WNL.

## 2018-04-24 DIAGNOSIS — Q893 Situs inversus: Secondary | ICD-10-CM

## 2018-04-24 LAB — BASIC METABOLIC PANEL
ANION GAP: 13 (ref 5–15)
BUN: 25 mg/dL — ABNORMAL HIGH (ref 8–23)
CALCIUM: 10.3 mg/dL (ref 8.9–10.3)
CO2: 33 mmol/L — ABNORMAL HIGH (ref 22–32)
Chloride: 97 mmol/L — ABNORMAL LOW (ref 98–111)
Creatinine, Ser: 0.85 mg/dL (ref 0.61–1.24)
Glucose, Bld: 104 mg/dL — ABNORMAL HIGH (ref 70–99)
POTASSIUM: 4.3 mmol/L (ref 3.5–5.1)
Sodium: 143 mmol/L (ref 135–145)

## 2018-04-24 LAB — CBC
HCT: 35.4 % — ABNORMAL LOW (ref 39.0–52.0)
Hemoglobin: 11.2 g/dL — ABNORMAL LOW (ref 13.0–17.0)
MCH: 29.6 pg (ref 26.0–34.0)
MCHC: 31.6 g/dL (ref 30.0–36.0)
MCV: 93.7 fL (ref 78.0–100.0)
Platelets: 742 10*3/uL — ABNORMAL HIGH (ref 150–400)
RBC: 3.78 MIL/uL — ABNORMAL LOW (ref 4.22–5.81)
RDW: 14.7 % (ref 11.5–15.5)
WBC: 24.3 10*3/uL — ABNORMAL HIGH (ref 4.0–10.5)

## 2018-04-24 LAB — GLUCOSE, CAPILLARY: Glucose-Capillary: 109 mg/dL — ABNORMAL HIGH (ref 70–99)

## 2018-04-24 LAB — MAGNESIUM: Magnesium: 2.4 mg/dL (ref 1.7–2.4)

## 2018-04-24 MED ORDER — GUAIFENESIN ER 600 MG PO TB12: 1200.0000 mg | ORAL_TABLET | Freq: Two times a day (BID) | ORAL | 0 refills | Status: DC

## 2018-04-24 MED ORDER — FUROSEMIDE 40 MG PO TABS: 40.0000 mg | ORAL_TABLET | Freq: Every day | ORAL | 0 refills | Status: DC

## 2018-04-24 MED ORDER — PREDNISONE 20 MG PO TABS: 40.0000 mg | ORAL_TABLET | Freq: Every day | ORAL | 0 refills | Status: AC

## 2018-04-24 NOTE — Progress Notes (Signed)
Subjective: He says he feels much better and wants to go home.  He has no new complaints.  His breathing is better.  He is requesting a portable oxygen concentrator  Objective: Vital signs in last 24 hours: Temp:  [98 F (36.7 C)-98.4 F (36.9 C)] 98.4 F (36.9 C) (08/13 0635) Pulse Rate:  [77-86] 82 (08/13 0635) Resp:  [18-22] 18 (08/13 0635) BP: (132-142)/(56-61) 137/57 (08/13 0635) SpO2:  [96 %-100 %] 97 % (08/13 0635) FiO2 (%):  [96 %] 96 % (08/12 1320) Weight change:  Last BM Date: 04/22/18  Intake/Output from previous day: 08/12 0701 - 08/13 0700 In: 720 [P.O.:720] Out: 3025 [Urine:3025]  PHYSICAL EXAM General appearance: alert, cooperative and no distress Resp: He has bilateral rhonchi but he is moving air well Cardio: regular rate and rhythm, S1, S2 normal, no murmur, click, rub or gallop GI: soft, non-tender; bowel sounds normal; no masses,  no organomegaly Extremities: extremities normal, atraumatic, no cyanosis or edema  Lab Results:  Results for orders placed or performed during the hospital encounter of 04/21/18 (from the past 48 hour(s))  Glucose, capillary     Status: Abnormal   Collection Time: 04/22/18 11:38 AM  Result Value Ref Range   Glucose-Capillary 129 (H) 70 - 99 mg/dL  Glucose, capillary     Status: Abnormal   Collection Time: 04/22/18  4:05 PM  Result Value Ref Range   Glucose-Capillary 232 (H) 70 - 99 mg/dL  Glucose, capillary     Status: Abnormal   Collection Time: 04/22/18  9:03 PM  Result Value Ref Range   Glucose-Capillary 206 (H) 70 - 99 mg/dL  Basic metabolic panel     Status: Abnormal   Collection Time: 04/23/18  4:39 AM  Result Value Ref Range   Sodium 139 135 - 145 mmol/L   Potassium 4.5 3.5 - 5.1 mmol/L   Chloride 96 (L) 98 - 111 mmol/L   CO2 32 22 - 32 mmol/L   Glucose, Bld 107 (H) 70 - 99 mg/dL   BUN 24 (H) 8 - 23 mg/dL   Creatinine, Ser 0.83 0.61 - 1.24 mg/dL   Calcium 9.6 8.9 - 10.3 mg/dL   GFR calc non Af Amer >60 >60  mL/min   GFR calc Af Amer >60 >60 mL/min    Comment: (NOTE) The eGFR has been calculated using the CKD EPI equation. This calculation has not been validated in all clinical situations. eGFR's persistently <60 mL/min signify possible Chronic Kidney Disease.    Anion gap 11 5 - 15    Comment: Performed at [facility], [address] [place], [place] [address]  Glucose, capillary     Status: Abnormal   Collection Time: 04/23/18  8:36 AM  Result Value Ref Range   Glucose-Capillary 201 (H) 70 - 99 mg/dL  Glucose, capillary     Status: Abnormal   Collection Time: 04/23/18 11:43 AM  Result Value Ref Range   Glucose-Capillary 149 (H) 70 - 99 mg/dL  Glucose, capillary     Status: Abnormal   Collection Time: 04/23/18  4:12 PM  Result Value Ref Range   Glucose-Capillary 126 (H) 70 - 99 mg/dL   Comment 1 Notify RN    Comment 2 Document in Chart   Glucose, capillary     Status: Abnormal   Collection Time: 04/23/18  9:06 PM  Result Value Ref Range   Glucose-Capillary 153 (H) 70 - 99 mg/dL  Basic metabolic panel     Status: Abnormal  Collection Time: 04/24/18  4:46 AM  Result Value Ref Range   Sodium 143 135 - 145 mmol/L   Potassium 4.3 3.5 - 5.1 mmol/L   Chloride 97 (L) 98 - 111 mmol/L   CO2 33 (H) 22 - 32 mmol/L   Glucose, Bld 104 (H) 70 - 99 mg/dL   BUN 25 (H) 8 - 23 mg/dL   Creatinine, Ser 0.85 0.61 - 1.24 mg/dL   Calcium 10.3 8.9 - 10.3 mg/dL   GFR calc non Af Amer >60 >60 mL/min   GFR calc Af Amer >60 >60 mL/min    Comment: (NOTE) The eGFR has been calculated using the CKD EPI equation. This calculation has not been validated in all clinical situations. eGFR's persistently <60 mL/min signify possible Chronic Kidney Disease.    Anion gap 13 5 - 15    Comment: Performed at [facility], [address] [place], [place] [address]  CBC     Status: Abnormal   Collection Time: 04/24/18  4:46 AM  Result Value Ref Range   WBC 24.3 (H) 4.0 - 10.5 K/uL   RBC 3.78 (L) 4.22 - 5.81  MIL/uL   Hemoglobin 11.2 (L) 13.0 - 17.0 g/dL   HCT 35.4 (L) 39.0 - 52.0 %   MCV 93.7 78.0 - 100.0 fL   MCH 29.6 26.0 - 34.0 pg   MCHC 31.6 30.0 - 36.0 g/dL   RDW 14.7 11.5 - 15.5 %   Platelets 742 (H) 150 - 400 K/uL    Comment: Performed at [facility], [address] [place], [place] [address]  Magnesium     Status: None   Collection Time: 04/24/18  4:46 AM  Result Value Ref Range   Magnesium 2.4 1.7 - 2.4 mg/dL    Comment: Performed at [facility], [address], [place], [place] [address]  Glucose, capillary     Status: Abnormal   Collection Time: 04/24/18  7:16 AM  Result Value Ref Range   Glucose-Capillary 109 (H) 70 - 99 mg/dL   Comment 1 Notify RN    Comment 2 Document in Chart     ABGS Recent Labs    04/21/18 0850  PHART 7.351  PO2ART 116*  HCO3 25.4   CULTURES Recent Results (from the past 240 hour(s))  MRSA PCR Screening     Status: None   Collection Time: 04/21/18 11:37 AM  Result Value Ref Range Status   MRSA by PCR NEGATIVE NEGATIVE Final    Comment:        The GeneXpert MRSA Assay (FDA approved for NASAL specimens only), is one component of a comprehensive MRSA colonization surveillance program. It is not intended to diagnose MRSA infection nor to guide or monitor treatment for MRSA infections. Performed at [facility], [address] [place], [place] [address]    Studies/Results: Ct Chest W Contrast  Result Date: 04/22/2018 CLINICAL DATA:  Dyspnea. Pt had recent dx of CHF ,pneumonia. Worsening after discharge. Hx of Kartagener syndrome EXAM: CT CHEST WITH CONTRAST TECHNIQUE: Multidetector CT imaging of the chest was performed during intravenous contrast administration. CONTRAST:  48m OMNIPAQUE IOHEXOL 300 MG/ML  SOLN COMPARISON:  Chest radiograph, 04/21/2018.  Chest CT, 11/25/2016. 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 Heart is normal in size. No pericardial effusion. Mild left coronary artery calcifications.  Great vessels are normal in caliber. Minor aortic atherosclerosis. No dissection. Mediastinum/Nodes: No neck base or axillary masses or pathologically enlarged lymph nodes. Several prominent mediastinal lymph nodes, largest  a prevascular node measuring 1 cm short axis. No mediastinal or hilar masses. No enlarged hilar lymph nodes. Lungs/Pleura: Small bilateral pleural effusions. Bilateral interstitial thickening. There is hazy ground-glass opacity in both lungs. Bronchial wall thickening is noted in the lower lobes. There is consolidation in the lower lobes with associated volume loss, consistent with atelectasis. There are areas of peribronchovascular opacity in the anterior upper lobes. These findings are new since the prior CT. Focal atelectasis in the posteromedial right lower lobe is stable from the prior CT. There is pleuroparenchymal scarring most evident at the left apex that is also unchanged. No pneumothorax. Upper Abdomen: No acute abnormality. Musculoskeletal: No fracture or acute finding. No osteoblastic or osteolytic lesions. IMPRESSION: 1. Small bilateral pleural effusions with bilateral interstitial thickening and ground-glass opacities, consistent with pulmonary edema. There are more confluent lung opacities in the lower lobes that are most likely atelectasis. Bilateral infection is not excluded but felt less likely. Aortic Atherosclerosis (ICD10-I70.0). Electronically Signed   By: Lajean Manes [name]   On: 04/22/2018 12:32    Medications:  Prior to Admission:  Medications Prior to Admission  Medication Sig Dispense Refill Last Dose  . albuterol (PROAIR HFA) 108 (90 Base) MCG/ACT inhaler Inhale 2 puffs into the lungs every 4 (four) hours as needed for wheezing or shortness of breath. 1 Inhaler 2 04/21/2018 at Unknown time  . azelastine (ASTELIN) 0.1 % nasal spray Place 2 sprays into both nostrils 2 (two) times daily. Use in each nostril as directed 30 mL 5 04/20/2018 at Unknown time  .  budesonide-formoterol (SYMBICORT) 160-4.5 MCG/ACT inhaler Inhale 2 puffs into the lungs 2 (two) times daily. 1 Inhaler 5 04/21/2018 at Unknown time  . DEXILANT 60 MG capsule TAKE (1) CAPSULE BY MOUTH EVERY DAY. 90 capsule 3 04/21/2018 at Unknown time  . DULoxetine (CYMBALTA) 60 MG capsule Take 60 mg by mouth daily.   04/21/2018 at Unknown time  . folic acid (FOLVITE) 1 MG tablet Take 1 mg by mouth daily.   04/21/2018 at Unknown time  . furosemide (LASIX) 20 MG tablet Take 1 tablet (20 mg total) by mouth daily. 30 tablet 1 04/21/2018 at Unknown time  . gabapentin (NEURONTIN) 600 MG tablet Take 1 tablet (600 mg total) by mouth 2 (two) times daily. (Patient taking differently: Take 600 mg by mouth 3 (three) times daily. ) 20 tablet 0 04/21/2018 at Unknown time  . insulin detemir (LEVEMIR) 100 UNIT/ML injection Inject 15-60 Units into the skin 2 (two) times daily. 15 units in the am and 60 units at qhs   04/20/2018 at Unknown time  . losartan (COZAAR) 100 MG tablet Take 100 mg by mouth daily.   04/21/2018 at Unknown time  . metFORMIN (GLUCOPHAGE) 1000 MG tablet Take 1,000 mg by mouth 2 (two) times daily with a meal.   04/21/2018 at Unknown time  . metoprolol tartrate (LOPRESSOR) 25 MG tablet Take 0.5 tablets (12.5 mg total) by mouth 2 (two) times daily. 60 tablet 1 04/21/2018 at 0800  . Multiple Vitamin (MULTIVITAMIN) tablet Take 1 tablet by mouth daily.   04/21/2018 at Unknown time  . pioglitazone (ACTOS) 45 MG tablet Take 45 mg by mouth daily.   04/21/2018 at Unknown time  . rOPINIRole (REQUIP) 1 MG tablet Take 1 mg by mouth at bedtime.   04/20/2018 at Unknown time  . rosuvastatin (CRESTOR) 40 MG tablet Take 1 tablet by mouth every evening.   04/20/2018 at Unknown time  . sitaGLIPtin (JANUVIA) 100 MG tablet  Take 100 mg by mouth daily.    04/21/2018 at Unknown time  . traMADol (ULTRAM) 50 MG tablet Take 1 tablet (50 mg total) by mouth every 6 (six) hours as needed. (Patient not taking: Reported on 04/21/2018) 10 tablet 0  Not Taking at Unknown time   Scheduled: . DULoxetine  60 mg Oral Daily  . enoxaparin (LOVENOX) injection  40 mg Subcutaneous Q24H  . furosemide  40 mg Intravenous BID  . gabapentin  600 mg Oral TID  . guaiFENesin  1,200 mg Oral BID  . insulin aspart  0-20 Units Subcutaneous TID WC  . insulin aspart  0-5 Units Subcutaneous QHS  . insulin detemir  15 Units Subcutaneous Q breakfast  . insulin detemir  60 Units Subcutaneous QHS  . ipratropium-albuterol  3 mL Nebulization Q6H WA  . losartan  100 mg Oral Daily  . methylPREDNISolone (SOLU-MEDROL) injection  40 mg Intravenous Q12H  . metoprolol tartrate  12.5 mg Oral BID  . mometasone-formoterol  2 puff Inhalation BID  . pantoprazole  40 mg Oral Daily  . rOPINIRole  1 mg Oral QHS  . rosuvastatin  40 mg Oral QPM  . sodium chloride flush  3 mL Intravenous Q12H   Continuous: . sodium chloride     GYJ:EHUDJS chloride, acetaminophen, albuterol, LORazepam, ondansetron (ZOFRAN) IV, sodium chloride flush  Assesment: He was admitted with acute on chronic hypoxic respiratory failure has what appears to be COPD exacerbation and acute on chronic diastolic heart failure.  He is much better.  He is on 4 L oxygen.  He requests a portable oxygen concentrator because he has neuropathy in his legs and has to walk with a cane and has a great deal of difficulty with pulling a portable tank.  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 Active Problems:   Insulin-requiring or dependent type II diabetes mellitus (HCC)   Kartagener syndrome   Acute on chronic diastolic CHF (congestive heart failure) (HCC)   Acute on chronic respiratory failure with hypoxia (HCC)   COPD with acute exacerbation ([place])    Plan: From a pulmonary point of view okay for discharge.  Will send information to case management to see if we can get him a portable oxygen concentrator.  My office will call him for follow-up appointment.     LOS: 3 days   Tishie Altmann L 04/24/2018, 8:32 AM

## 2018-04-24 NOTE — Progress Notes (Signed)
DC home today with self care. Per MD [facility] nursing not needed. Pt asking out getting POC. His home oxygen was set up last week through Dr. Noel Journey office. CM has contacted [place] who thinks they did receive order for POC but says they have a waiting list. CM routed another order over, just in case, called pt at home and discussed process for getting POC. [place] to call patient also after orders reviewed.

## 2018-04-24 NOTE — Discharge Summary (Signed)
Physician Discharge Summary  NAVI ERBER INO:[id number] DOB: 16-Feb-1951 DOA: 04/21/2018  PCP: [name], MD  Admit date: 04/21/2018  Discharge date: 04/24/2018  Admitted From:Home  Disposition:  Home  Recommendations for Outpatient Follow-up:  1. Follow up with PCP in 1-2 weeks 2. Follow up with Dr. Luan Pulling who will schedule to see patient in office soon  Home Health:N/A  Equipment/Devices:Home oxygen; will get concentrator delivered  Discharge Condition:Stable  CODE STATUS: Full  Diet recommendation: Heart Healthy/Carb modified  Brief/Interim Summary:  67 year old male with a history of Kartagener's syndrome, diabetes, CHF, was recently discharged from hospital after being treated for CHF exacerbation and pneumonia. He was discharged home on supplemental oxygen. He had progressive worsening of shortness of breath since discharge and was noted to be hypoxic on nasal cannula when he returned to the emergency room. Patient had initially been placed on partial rebreather mask. Imaging indicated persistent CHF and therefore, he was diuresed.  He was also noted to have a component of COPD exacerbation and was placed on bronchodilators as well as steroids with vast improvement noted.  He will resume his home oxygen on discharge and will have oxygen concentrator set up for him at home.  He will follow-up with Dr. Luan Pulling who will call to set up an appointment with him.  Discharge Diagnoses:  Active Problems:   Insulin-requiring or dependent type II diabetes mellitus (HCC)   Kartagener syndrome   Acute on chronic diastolic CHF (congestive heart failure) (HCC)   Acute on chronic respiratory failure with hypoxia (HCC)   COPD with acute exacerbation ([place])  1. Acute on chronic respiratory failure with hypoxia-improved. Initially required partial rebreather due to hypoxia on nasal cannula. Suspect this is related to CHF exacerbation versus bronchiectasis due to Kartagener's syndrome.  Continue home oxygen as well as oral prednisone with breathing treatments as needed.  He will have home concentrator set up.  Follow-up with Dr. Luan Pulling as scheduled. 2. Acute on chronic diastolic congestive heart failure-improved. Continue home Lasix and monitor daily weights as well as fluid intake. 3. COPD versus bronchiectasis. Continue home nebulizer treatments as needed.  CT of the chest with findings of atelectasis and some chronic changes on 8/11. 4. Diabetes. Resume home oral agents.  Blood glucose with decent control while admitted. 5. GERD. Continue on PPI 6. Kartagener's syndrome  Discharge Instructions  Discharge Instructions    Diet - low sodium heart healthy   Complete by:  As directed    Increase activity slowly   Complete by:  As directed      Allergies as of 04/24/2018   No Known Allergies     Medication List    TAKE these medications   albuterol 108 (90 Base) MCG/ACT inhaler Commonly known as:  PROVENTIL HFA;VENTOLIN HFA Inhale 2 puffs into the lungs every 4 (four) hours as needed for wheezing or shortness of breath.   azelastine 0.1 % nasal spray Commonly known as:  ASTELIN Place 2 sprays into both nostrils 2 (two) times daily. Use in each nostril as directed   budesonide-formoterol 160-4.5 MCG/ACT inhaler Commonly known as:  SYMBICORT Inhale 2 puffs into the lungs 2 (two) times daily.   DEXILANT 60 MG capsule Generic drug:  dexlansoprazole TAKE (1) CAPSULE BY MOUTH EVERY DAY.   DULoxetine 60 MG capsule Commonly known as:  CYMBALTA Take 60 mg by mouth daily.   folic acid 1 MG tablet Commonly known as:  FOLVITE Take 1 mg by mouth daily.   furosemide 40 MG  tablet Commonly known as:  LASIX Take 1 tablet (40 mg total) by mouth daily. What changed:    medication strength  how much to take   gabapentin 600 MG tablet Commonly known as:  NEURONTIN Take 1 tablet (600 mg total) by mouth 2 (two) times daily. What changed:  when to take this    guaiFENesin 600 MG 12 hr tablet Commonly known as:  MUCINEX Take 2 tablets (1,200 mg total) by mouth 2 (two) times daily.   insulin detemir 100 UNIT/ML injection Commonly known as:  LEVEMIR Inject 15-60 Units into the skin 2 (two) times daily. 15 units in the am and 60 units at qhs   losartan 100 MG tablet Commonly known as:  COZAAR Take 100 mg by mouth daily.   metFORMIN 1000 MG tablet Commonly known as:  GLUCOPHAGE Take 1,000 mg by mouth 2 (two) times daily with a meal.   metoprolol tartrate 25 MG tablet Commonly known as:  LOPRESSOR Take 0.5 tablets (12.5 mg total) by mouth 2 (two) times daily.   multivitamin tablet Take 1 tablet by mouth daily.   pioglitazone 45 MG tablet Commonly known as:  ACTOS Take 45 mg by mouth daily.   predniSONE 20 MG tablet Commonly known as:  DELTASONE Take 2 tablets (40 mg total) by mouth daily for 5 days.   rOPINIRole 1 MG tablet Commonly known as:  REQUIP Take 1 mg by mouth at bedtime.   rosuvastatin 40 MG tablet Commonly known as:  CRESTOR Take 1 tablet by mouth every evening.   sitaGLIPtin 100 MG tablet Commonly known as:  JANUVIA Take 100 mg by mouth daily.   traMADol 50 MG tablet Commonly known as:  ULTRAM Take 1 tablet (50 mg total) by mouth every 6 (six) hours as needed.            Durable Medical Equipment  (From admission, onward)         Start     Ordered   04/24/18 1004  For home use only DME oxygen  Once    Comments:  Deliver portable oxygen concentrator  Question Answer Comment  Mode or (Route) Nasal cannula   Liters per Minute 2   Frequency Continuous (stationary and portable oxygen unit needed)   Oxygen conserving device Yes   Oxygen delivery system Gas      04/24/18 1003   04/24/18 0946  DME Oxygen  Once    Question Answer Comment  Mode or (Route) Nasal cannula   Oxygen conserving device Yes   Oxygen delivery system Gas      04/24/18 0945         Follow-up Information    [name], MD  Follow up in 1 week(s).   Specialty:  Internal Medicine Contact information: [address] [place] [address] [phone]        Arnoldo Lenis, MD .   Specialty:  Cardiology Contact information: [address] [place] [address] [phone]          No Known Allergies  Consultations:  Pulmonology   Procedures/Studies: Dg Chest 2 View  Result Date: 04/10/2018 CLINICAL DATA:  Productive cough, shortness of breath. EXAM: CHEST - 2 VIEW COMPARISON:  Radiographs of October 14, 2016. FINDINGS: The heart size and mediastinal contours are within normal limits. No pneumothorax is noted. Minimal bilateral pleural effusions are noted. Increased bilateral lung opacities are noted concerning for edema or possibly pneumonia. The visualized skeletal structures are unremarkable. IMPRESSION:  Increased bilateral lung opacities are noted concerning for edema or possibly pneumonia. Continued radiographic follow-up is recommended. Electronically Signed   By: Marijo Conception, [name]   On: 04/10/2018 12:21   Ct Chest W Contrast  Result Date: 04/22/2018 CLINICAL DATA:  Dyspnea. Pt had recent dx of CHF ,pneumonia. Worsening after discharge. Hx of Kartagener syndrome EXAM: CT CHEST WITH CONTRAST TECHNIQUE: Multidetector CT imaging of the chest was performed during intravenous contrast administration. CONTRAST:  79mL OMNIPAQUE IOHEXOL 300 MG/ML  SOLN COMPARISON:  Chest radiograph, 04/21/2018.  Chest CT, 11/25/2016. 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 Heart is normal in size. No pericardial effusion. Mild left coronary artery calcifications. Great vessels are normal in caliber. Minor aortic atherosclerosis. No dissection. Mediastinum/Nodes: No neck base or axillary masses or pathologically enlarged lymph nodes. Several prominent mediastinal lymph nodes, largest a prevascular node measuring 1 cm short axis. No mediastinal or hilar masses. No  enlarged hilar lymph nodes. Lungs/Pleura: Small bilateral pleural effusions. Bilateral interstitial thickening. There is hazy ground-glass opacity in both lungs. Bronchial wall thickening is noted in the lower lobes. There is consolidation in the lower lobes with associated volume loss, consistent with atelectasis. There are areas of peribronchovascular opacity in the anterior upper lobes. These findings are new since the prior CT. Focal atelectasis in the posteromedial right lower lobe is stable from the prior CT. There is pleuroparenchymal scarring most evident at the left apex that is also unchanged. No pneumothorax. Upper Abdomen: No acute abnormality. Musculoskeletal: No fracture or acute finding. No osteoblastic or osteolytic lesions. IMPRESSION: 1. Small bilateral pleural effusions with bilateral interstitial thickening and ground-glass opacities, consistent with pulmonary edema. There are more confluent lung opacities in the lower lobes that are most likely atelectasis. Bilateral infection is not excluded but felt less likely. Aortic Atherosclerosis (ICD10-I70.0). Electronically Signed   By: Lajean Manes [name]   On: 04/22/2018 12:32   Dg Chest Port 1 View  Result Date: 04/21/2018 CLINICAL DATA:  Shortness of breath. EXAM: PORTABLE CHEST 1 VIEW COMPARISON:  04/10/2018, 10/14/2016 and chest CT dated 11/25/2016. FINDINGS: Previously noted situs inversus. The cardiac silhouette is currently enlarged with a mild increase in size. Increased prominence of the pulmonary vasculature and interstitial markings with an interval small left pleural effusion. There is some loss of detail due to patient breathing motion. Again noted are thoracic spine degenerative changes and cervical spine fixation hardware. IMPRESSION: 1. Interval mild cardiomegaly and changes of congestive heart failure. 2. Previously noted situs inversus. Electronically Signed   By: Claudie Revering [name]   On: 04/21/2018 09:59   Discharge  Exam: Vitals:   04/24/18 0635 04/24/18 0832  BP: (!) 137/57   Pulse: 82   Resp: 18   Temp: 98.4 F (36.9 C)   SpO2: 97% 98%   Vitals:   04/23/18 2105 04/23/18 2117 04/24/18 0635 04/24/18 0832  BP: (!) 142/61  (!) 137/57   Pulse: 86  82   Resp: 18  18   Temp:   98.4 F (36.9 C)   TempSrc:   Oral   SpO2: 96% 96% 97% 98%  Weight:      Height:        General: Pt is alert, awake, not in acute distress Cardiovascular: RRR, S1/S2 +, no rubs, no gallops Respiratory: CTA bilaterally, no wheezing, no rhonchi; on Annex Abdominal: Soft, NT, ND, bowel sounds + Extremities: no edema, no cyanosis    The results of significant diagnostics from this hospitalization (including  imaging, microbiology, ancillary and laboratory) are listed below for reference.     Microbiology: Recent Results (from the past 240 hour(s))  MRSA PCR Screening     Status: None   Collection Time: 04/21/18 11:37 AM  Result Value Ref Range Status   MRSA by PCR NEGATIVE NEGATIVE Final    Comment:        The GeneXpert MRSA Assay (FDA approved for NASAL specimens only), is one component of a comprehensive MRSA colonization surveillance program. It is not intended to diagnose MRSA infection nor to guide or monitor treatment for MRSA infections. Performed at [facility], [address], [place], [place] [address]      Labs: BNP (last 3 results) Recent Labs    04/10/18 1308 04/21/18 0846  BNP 231.0* 169.6*   Basic Metabolic Panel: Recent Labs  Lab 04/21/18 0846 04/22/18 0405 04/23/18 0439 04/24/18 0446  NA 138 133* 139 143  K 5.3* 4.9 4.5 4.3  CL 100 95* 96* 97*  CO2 29 27 32 33*  GLUCOSE 96 169* 107* 104*  BUN 18 24* 24* 25*  CREATININE 0.88 0.78 0.83 0.85  CALCIUM 9.6 9.3 9.6 10.3  MG  --   --   --  2.4   Liver Function Tests: No results for input(s): AST, ALT, ALKPHOS, BILITOT, PROT, ALBUMIN in the last 168 hours. No results for input(s): LIPASE, AMYLASE in the last 168 hours. No  results for input(s): AMMONIA in the last 168 hours. CBC: Recent Labs  Lab 04/21/18 0846 04/24/18 0446  WBC 14.2* 24.3*  HGB 8.9* 11.2*  HCT 29.1* 35.4*  MCV 94.5 93.7  PLT 637* 742*   Cardiac Enzymes: No results for input(s): CKTOTAL, CKMB, CKMBINDEX, TROPONINI in the last 168 hours. BNP: Invalid input(s): POCBNP CBG: Recent Labs  Lab 04/23/18 0836 04/23/18 1143 04/23/18 1612 04/23/18 2106 04/24/18 0716  GLUCAP 201* 149* 126* 153* 109*   D-Dimer No results for input(s): DDIMER in the last 72 hours. Hgb A1c No results for input(s): HGBA1C in the last 72 hours. Lipid Profile No results for input(s): CHOL, HDL, LDLCALC, TRIG, CHOLHDL, LDLDIRECT in the last 72 hours. Thyroid function studies No results for input(s): TSH, T4TOTAL, T3FREE, THYROIDAB in the last 72 hours.  Invalid input(s): FREET3 Anemia work up No results for input(s): VITAMINB12, FOLATE, FERRITIN, TIBC, IRON, RETICCTPCT in the last 72 hours. Urinalysis    Component Value Date/Time   COLORURINE YELLOW 05/13/2015 1440   APPEARANCEUR CLEAR 05/13/2015 1440   LABSPEC <1.005 (L) 05/13/2015 1440   PHURINE 5.5 05/13/2015 1440   GLUCOSEU >1000 (A) 05/13/2015 1440   HGBUR NEGATIVE 05/13/2015 1440   BILIRUBINUR NEGATIVE 05/13/2015 1440   KETONESUR NEGATIVE 05/13/2015 1440   PROTEINUR NEGATIVE 05/13/2015 1440   UROBILINOGEN 0.2 05/13/2015 1440   NITRITE NEGATIVE 05/13/2015 1440   LEUKOCYTESUR NEGATIVE 05/13/2015 1440   Sepsis Labs Invalid input(s): PROCALCITONIN,  WBC,  LACTICIDVEN Microbiology Recent Results (from the past 240 hour(s))  MRSA PCR Screening     Status: None   Collection Time: 04/21/18 11:37 AM  Result Value Ref Range Status   MRSA by PCR NEGATIVE NEGATIVE Final    Comment:        The GeneXpert MRSA Assay (FDA approved for NASAL specimens only), is one component of a comprehensive MRSA colonization surveillance program. It is not intended to diagnose MRSA infection nor to guide  or monitor treatment for MRSA infections. Performed at [facility], [address] [place], [place] [address]  Time coordinating discharge: 40 minutes  SIGNED:   Rodena Goldmann, DO [facility] 04/24/2018, 11:17 AM Pager [phone]  If 7PM-7AM, please contact night-coverage [email or web address] Password [email or web address]

## 2018-04-24 NOTE — Progress Notes (Signed)
Discharge instructions reviewed with patient. Given copy of AVS, patient aware to pick up prescriptions from [facility]. Verbalized understanding of instructions, daily weights, when to follow-up with PCP and cardiology. Case management to follow-up on portable home O2. Patient has home O2 already arranged and tank at bedside for transport home. Patient request nursing staff call him a cab for transport home. Cab notified by nursing secretary. Patient left floor in stable condition via w/c accompanied by nurse techs. Donavan Foil, RN

## 2018-04-27 ENCOUNTER — Encounter: Payer: Self-pay | Admitting: Cardiology

## 2018-04-27 ENCOUNTER — Ambulatory Visit (INDEPENDENT_AMBULATORY_CARE_PROVIDER_SITE_OTHER): Payer: 59 | Admitting: Cardiology

## 2018-04-27 VITALS — BP 124/60 | HR 80 | Ht 67.0 in | Wt 256.0 lb

## 2018-04-27 DIAGNOSIS — R079 Chest pain, unspecified: Secondary | ICD-10-CM | POA: Diagnosis not present

## 2018-04-27 DIAGNOSIS — I5032 Chronic diastolic (congestive) heart failure: Secondary | ICD-10-CM

## 2018-04-27 DIAGNOSIS — R0602 Shortness of breath: Secondary | ICD-10-CM | POA: Diagnosis not present

## 2018-04-27 MED ORDER — FUROSEMIDE 40 MG PO TABS: 60.0000 mg | ORAL_TABLET | Freq: Every day | ORAL | 3 refills | Status: DC

## 2018-04-27 NOTE — Progress Notes (Signed)
Clinical Summary Brett [name] is a 67 y.o.male seen today for follow up of the following medical problems.     1. Chronic diastolic HF - admisssion 0/3/00 with SOB. Mutlfactorial including diastolic HF but also related to his chronic lung disease,. He was diuresed, also managed with bronchodilators and antibiotics for respiratory infection - readmitted later in 04/2018 with recurrent SOB. Managed again for COPD/bronchiectasis and diastolic HF  - no discharge weight from recent admission - breathing has been doing well since charge - some LE edema. No orthopnea. - not checking home weights. Hospital last documented weight  253   2. Elevated troponin - mild trop elevation to 0.43 in setting of HF and hypoxia, did not have typical chest pain. Thought likely demand ischemia, plans were to consider possible outpatient stress - echo normal LVEF no WMAs.   - can have some chest tightness at times. Lasts a few seconds. Can be affected by deep breathing.   3. Bronchiectasis - followed by pulmonary   4. Palpitations - episode lasted 7- 8 minutes at rest. Not related to breathing  - coke 0 x 3-4 bottles, no coffee, no tea. Currently on prednisone, bronchodilators   Past Medical History:  Diagnosis Date  . Depression   . Dextrocardia   . Diabetes mellitus   . H. pylori infection 11/09/2012   treated with pylera  . HTN (hypertension)    Cholesterol  . Iron deficiency anemia, unspecified 10/18/2012  . Neuropathy   . Pneumonia   . Tachycardia      No Known Allergies   Current Outpatient Medications  Medication Sig Dispense Refill  . albuterol (PROAIR HFA) 108 (90 Base) MCG/ACT inhaler Inhale 2 puffs into the lungs every 4 (four) hours as needed for wheezing or shortness of breath. 1 Inhaler 2  . azelastine (ASTELIN) 0.1 % nasal spray Place 2 sprays into both nostrils 2 (two) times daily. Use in each nostril as directed 30 mL 5  . budesonide-formoterol (SYMBICORT) 160-4.5  MCG/ACT inhaler Inhale 2 puffs into the lungs 2 (two) times daily. 1 Inhaler 5  . DEXILANT 60 MG capsule TAKE (1) CAPSULE BY MOUTH EVERY DAY. 90 capsule 3  . DULoxetine (CYMBALTA) 60 MG capsule Take 60 mg by mouth daily.    . folic acid (FOLVITE) 1 MG tablet Take 1 mg by mouth daily.    . furosemide (LASIX) 40 MG tablet Take 1 tablet (40 mg total) by mouth daily. 30 tablet 0  . gabapentin (NEURONTIN) 600 MG tablet Take 1 tablet (600 mg total) by mouth 2 (two) times daily. (Patient taking differently: Take 600 mg by mouth 3 (three) times daily. ) 20 tablet 0  . guaiFENesin (MUCINEX) 600 MG 12 hr tablet Take 2 tablets (1,200 mg total) by mouth 2 (two) times daily. 20 tablet 0  . insulin detemir (LEVEMIR) 100 UNIT/ML injection Inject 15-60 Units into the skin 2 (two) times daily. 15 units in the am and 60 units at qhs    . losartan (COZAAR) 100 MG tablet Take 100 mg by mouth daily.    . metFORMIN (GLUCOPHAGE) 1000 MG tablet Take 1,000 mg by mouth 2 (two) times daily with a meal.    . metoprolol tartrate (LOPRESSOR) 25 MG tablet Take 0.5 tablets (12.5 mg total) by mouth 2 (two) times daily. 60 tablet 1  . Multiple Vitamin (MULTIVITAMIN) tablet Take 1 tablet by mouth daily.    . pioglitazone (ACTOS) 45 MG tablet Take 45 mg by mouth  daily.    . predniSONE (DELTASONE) 20 MG tablet Take 2 tablets (40 mg total) by mouth daily for 5 days. 10 tablet 0  . rOPINIRole (REQUIP) 1 MG tablet Take 1 mg by mouth at bedtime.    . rosuvastatin (CRESTOR) 40 MG tablet Take 1 tablet by mouth every evening.    . sitaGLIPtin (JANUVIA) 100 MG tablet Take 100 mg by mouth daily.     . traMADol (ULTRAM) 50 MG tablet Take 1 tablet (50 mg total) by mouth every 6 (six) hours as needed. (Patient not taking: Reported on 04/21/2018) 10 tablet 0   No current facility-administered medications for this visit.      Past Surgical History:  Procedure Laterality Date  . CATARACT EXTRACTION, BILATERAL  2016  . COLONOSCOPY WITH  ESOPHAGOGASTRODUODENOSCOPY (EGD) N/A 11/09/2012   Dr. 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 hpylori on bx- treated with pylera. TCS- normal rectum. 1 diminutive polyp in the mid descending segment. 1-76mm polyp in the mid desending segment o/w the remainder of the colonic mucosa appeared normal. tubular adenoma on bx  . GIVENS CAPSULE STUDY N/A 10/07/2013   no source for anemia or heme positive stool noted  . NECK SURGERY     bone spurs     No Known Allergies    Family History  Problem Relation Age of Onset  . Diabetes Sister        Fx  . Heart defect Sister        Fx  . Arthritis Sister        Fx  . Asthma Sister        Fx  . Kidney disease Sister        Fx  . Pancreatitis Sister      Social History Mr. Faircloth reports that he has never smoked. His smokeless tobacco use includes chew. Mr. Figgs reports that he does not drink alcohol.   Review of Systems CONSTITUTIONAL: No weight loss, fever, chills, weakness or fatigue.  HEENT: Eyes: No visual loss, blurred vision, double vision or yellow sclerae.No hearing loss, sneezing, congestion, runny nose or sore throat.  SKIN: No rash or itching.  CARDIOVASCULAR: per hpi RESPIRATORY: No shortness of breath, cough or sputum.  GASTROINTESTINAL: No anorexia, nausea, vomiting or diarrhea. No abdominal pain or blood.  GENITOURINARY: No burning on urination, no polyuria NEUROLOGICAL: No headache, dizziness, syncope, paralysis, ataxia, numbness or tingling in the extremities. No change in bowel or bladder control.  MUSCULOSKELETAL: No muscle, back pain, joint pain or stiffness.  LYMPHATICS: No enlarged nodes. No history of splenectomy.  PSYCHIATRIC: No history of depression or anxiety.  ENDOCRINOLOGIC: No reports of sweating, cold or heat intolerance. No polyuria or polydipsia.  Marland Kitchen   Physical Examination Vitals:   04/27/18 0943  BP: 124/60    Pulse: 80  SpO2: 97%   Vitals:   04/27/18 0943  Weight: 256 lb (116.1 kg)  Height: 5\' 7"  (1.702 m)    Gen: resting comfortably, no acute distress HEENT: no scleral icterus, pupils equal round and reactive, no palptable cervical adenopathy,  CV: RRR, no m/r/g, no jvd Resp: Clear to auscultation bilaterally GI: abdomen is soft, non-tender, non-distended, normal bowel sounds, no hepatosplenomegaly MSK: extremities are warm,trace bilateral edema Skin: warm, no rash Neuro:  no focal deficits Psych: appropriate affect    Assessment and Plan   1 Chronic diastolic HF - mild fluid overload by exam, we will increase lasix to  60mg  daily. Check BMET/Mg in 2 weeks  2. Elevated troponin - elevated trop in setting of CHF and hypoxia on prior admission - suspect probable demand ischemia, cannot completely exclude some component of CAD - would plan for ischemic testing. With his ongoign respiraotry issues would not give lexiscan. He is not able to exercise due to chronic hypioxia and unstable gait. Would avoid dobutmine with recent elevated troponin. Best option would be coronary CTA which we will arrange  3. 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   5. Palpitations - 2 isolated episodes. He is on steroids, high caffeine intake, bronchodilators.  - follow at this time, wean caffeine. Will be completing steroid course soon.    Arnoldo Lenis, [name]

## 2018-04-27 NOTE — Patient Instructions (Addendum)
Medication Instructions:  INCREASE LASIX TO 60 MG DAILY (1 1/2 TABLETS)  Start apririn 81 mg daily  Labwork:  2 WEEKS    BMET MAGNESIUM   Testing/Procedures: Non-Cardiac CT Angiography (CTA), is a special type of CT scan that uses a computer to produce multi-dimensional views of major blood vessels throughout the body. In CT angiography, a contrast material is injected through an IV to help visualize the blood vessels   Follow-Up: Your physician recommends that you schedule a follow-up appointment in: 3-4 WEEKS    Any Other Special Instructions Will Be Listed Below (If Applicable).     If you need a refill on your cardiac medications before your next appointment, please call your pharmacy.  Cardiac CT Angiogram A cardiac CT angiogram is a procedure to look at the heart and the area around the heart. It may be done to help find the cause of chest pains or other symptoms of heart disease. During this procedure, a large X-ray machine, called a CT scanner, takes detailed pictures of the heart and the surrounding area after a dye (contrast material) has been injected into blood vessels in the area. The procedure is also sometimes called a coronary CT angiogram, coronary artery scanning, or CTA. A cardiac CT angiogram allows the health care provider to see how well blood is flowing to and from the heart. The health care provider will be able to see if there are any problems, such as:  Blockage or narrowing of the coronary arteries in the heart.  Fluid around the heart.  Signs of weakness or disease in the muscles, valves, and tissues of the heart.  Tell a health care provider about:  Any allergies you have. This is especially important if you have had a previous allergic reaction to contrast dye.  All medicines you are taking, including vitamins, herbs, eye drops, creams, and over-the-counter medicines.  Any blood disorders you have.  Any surgeries you have had.  Any medical  conditions you have.  Whether you are pregnant or may be pregnant.  Any anxiety disorders, chronic pain, or other conditions you have that may increase your stress or prevent you from lying still. What are the risks? Generally, this is a safe procedure. However, problems may occur, including:  Bleeding.  Infection.  Allergic reactions to medicines or dyes.  Damage to other structures or organs.  Kidney damage from the dye or contrast that is used.  Increased risk of cancer from radiation exposure. This risk is low. Talk with your health care provider about: ? The risks and benefits of testing. ? How you can receive the lowest dose of radiation.  What happens before the procedure?  Wear comfortable clothing and remove any jewelry, glasses, dentures, and hearing aids.  Follow instructions from your health care provider about eating and drinking. This may include: ? For 12 hours before the test - avoid caffeine. This includes tea, coffee, soda, energy drinks, and diet pills. Drink plenty of water or other fluids that do not have caffeine in them. Being well-hydrated can prevent complications. ? For 4-6 hours before the test - stop eating and drinking. The contrast dye can cause nausea, but this is less likely if your stomach is empty.  Ask your health care provider about changing or stopping your regular medicines. This is especially important if you are taking diabetes medicines, blood thinners, or medicines to treat erectile dysfunction. What happens during the procedure?  Hair on your chest may need to be removed  so that small sticky patches called electrodes can be placed on your chest. These will transmit information that helps to monitor your heart during the test.  An IV tube will be inserted into one of your veins.  You might be given a medicine to control your heart rate during the test. This will help to ensure that good images are obtained.  You will be asked to lie on  an exam table. This table will slide in and out of the CT machine during the procedure.  Contrast dye will be injected into the IV tube. You might feel warm, or you may get a metallic taste in your mouth.  You will be given a medicine (nitroglycerin) to relax (dilate) the arteries in your heart.  The table that you are lying on will move into the CT machine tunnel for the scan.  The person running the machine will give you instructions while the scans are being done. You may be asked to: ? Keep your arms above your head. ? Hold your breath. ? Stay very still, even if the table is moving.  When the scanning is complete, you will be moved out of the machine.  The IV tube will be removed. The procedure may vary among health care providers and hospitals. What happens after the procedure?  You might feel warm, or you may get a metallic taste in your mouth from the contrast dye.  You may have a headache from the nitroglycerin.  After the procedure, drink water or other fluids to wash (flush) the contrast material out of your body.  Contact a health care provider if you have any symptoms of allergy to the contrast. These symptoms include: ? Shortness of breath. ? Rash or hives. ? A racing heartbeat.  Most people can return to their normal activities right after the procedure. Ask your health care provider what activities are safe for you.  It is up to you to get the results of your procedure. Ask your health care provider, or the department that is doing the procedure, when your results will be ready. Summary  A cardiac CT angiogram is a procedure to look at the heart and the area around the heart. It may be done to help find the cause of chest pains or other symptoms of heart disease.  During this procedure, a large X-ray machine, called a CT scanner, takes detailed pictures of the heart and the surrounding area after a dye (contrast material) has been injected into blood vessels in  the area.  Ask your health care provider about changing or stopping your regular medicines before the procedure. This is especially important if you are taking diabetes medicines, blood thinners, or medicines to treat erectile dysfunction.  After the procedure, drink water or other fluids to wash (flush) the contrast material out of your body. This information is not intended to replace advice given to you by your health care provider. Make sure you discuss any questions you have with your health care provider. Document Released: 08/11/2008 Document Revised: 07/18/2016 Document Reviewed: 07/18/2016 Elsevier Interactive Patient Education  2017 [employer].

## 2018-05-15 ENCOUNTER — Telehealth: Payer: Self-pay

## 2018-05-15 ENCOUNTER — Other Ambulatory Visit: Payer: Self-pay

## 2018-05-15 DIAGNOSIS — Z79899 Other long term (current) drug therapy: Secondary | ICD-10-CM

## 2018-05-15 NOTE — Telephone Encounter (Signed)
Called pt, no answer. Left message for pt to return call. Mailed lab slip and cardiac ct instructions. Will try to contact pt again later.o

## 2018-05-16 LAB — BASIC METABOLIC PANEL
BUN: 13 mg/dL (ref 7–25)
CALCIUM: 10.3 mg/dL (ref 8.6–10.3)
CO2: 32 mmol/L (ref 20–32)
Chloride: 93 mmol/L — ABNORMAL LOW (ref 98–110)
Creat: 1.07 mg/dL (ref 0.70–1.25)
Glucose, Bld: 90 mg/dL (ref 65–99)
POTASSIUM: 5.2 mmol/L (ref 3.5–5.3)
SODIUM: 137 mmol/L (ref 135–146)

## 2018-05-16 LAB — MAGNESIUM: Magnesium: 1.3 mg/dL — ABNORMAL LOW (ref 1.5–2.5)

## 2018-05-18 ENCOUNTER — Inpatient Hospital Stay (HOSPITAL_COMMUNITY): Payer: 59 | Attending: Hematology

## 2018-05-18 DIAGNOSIS — I5033 Acute on chronic diastolic (congestive) heart failure: Secondary | ICD-10-CM | POA: Diagnosis not present

## 2018-05-18 DIAGNOSIS — G629 Polyneuropathy, unspecified: Secondary | ICD-10-CM | POA: Diagnosis not present

## 2018-05-18 DIAGNOSIS — J984 Other disorders of lung: Secondary | ICD-10-CM | POA: Diagnosis not present

## 2018-05-18 DIAGNOSIS — Z9981 Dependence on supplemental oxygen: Secondary | ICD-10-CM | POA: Insufficient documentation

## 2018-05-18 DIAGNOSIS — D72829 Elevated white blood cell count, unspecified: Secondary | ICD-10-CM | POA: Insufficient documentation

## 2018-05-18 DIAGNOSIS — F329 Major depressive disorder, single episode, unspecified: Secondary | ICD-10-CM | POA: Diagnosis not present

## 2018-05-18 DIAGNOSIS — K909 Intestinal malabsorption, unspecified: Secondary | ICD-10-CM | POA: Diagnosis not present

## 2018-05-18 DIAGNOSIS — I110 Hypertensive heart disease with heart failure: Secondary | ICD-10-CM | POA: Diagnosis not present

## 2018-05-18 DIAGNOSIS — Z794 Long term (current) use of insulin: Secondary | ICD-10-CM | POA: Diagnosis not present

## 2018-05-18 DIAGNOSIS — Z7982 Long term (current) use of aspirin: Secondary | ICD-10-CM | POA: Diagnosis not present

## 2018-05-18 DIAGNOSIS — Z79899 Other long term (current) drug therapy: Secondary | ICD-10-CM | POA: Insufficient documentation

## 2018-05-18 DIAGNOSIS — J449 Chronic obstructive pulmonary disease, unspecified: Secondary | ICD-10-CM | POA: Diagnosis not present

## 2018-05-18 DIAGNOSIS — I471 Supraventricular tachycardia: Secondary | ICD-10-CM | POA: Diagnosis not present

## 2018-05-18 DIAGNOSIS — E119 Type 2 diabetes mellitus without complications: Secondary | ICD-10-CM | POA: Diagnosis not present

## 2018-05-18 DIAGNOSIS — D500 Iron deficiency anemia secondary to blood loss (chronic): Secondary | ICD-10-CM | POA: Diagnosis present

## 2018-05-18 DIAGNOSIS — K219 Gastro-esophageal reflux disease without esophagitis: Secondary | ICD-10-CM | POA: Diagnosis not present

## 2018-05-18 DIAGNOSIS — M4800 Spinal stenosis, site unspecified: Secondary | ICD-10-CM | POA: Diagnosis not present

## 2018-05-18 LAB — CBC WITH DIFFERENTIAL/PLATELET
BASOS ABS: 0 10*3/uL (ref 0.0–0.1)
BASOS PCT: 0 %
EOS ABS: 0.1 10*3/uL (ref 0.0–0.7)
Eosinophils Relative: 1 %
HCT: 32 % — ABNORMAL LOW (ref 39.0–52.0)
HEMOGLOBIN: 10 g/dL — AB (ref 13.0–17.0)
Lymphocytes Relative: 24 %
Lymphs Abs: 2.6 10*3/uL (ref 0.7–4.0)
MCH: 29.1 pg (ref 26.0–34.0)
MCHC: 31.3 g/dL (ref 30.0–36.0)
MCV: 93 fL (ref 78.0–100.0)
MONO ABS: 0.8 10*3/uL (ref 0.1–1.0)
MONOS PCT: 8 %
NEUTROS PCT: 67 %
Neutro Abs: 7.3 10*3/uL (ref 1.7–7.7)
PLATELETS: 433 10*3/uL — AB (ref 150–400)
RBC: 3.44 MIL/uL — ABNORMAL LOW (ref 4.22–5.81)
RDW: 14.3 % (ref 11.5–15.5)
WBC: 10.9 10*3/uL — ABNORMAL HIGH (ref 4.0–10.5)

## 2018-05-18 LAB — COMPREHENSIVE METABOLIC PANEL
ALT: 16 U/L (ref 0–44)
AST: 21 U/L (ref 15–41)
Albumin: 4.1 g/dL (ref 3.5–5.0)
Alkaline Phosphatase: 39 U/L (ref 38–126)
Anion gap: 8 (ref 5–15)
BILIRUBIN TOTAL: 0.5 mg/dL (ref 0.3–1.2)
BUN: 8 mg/dL (ref 8–23)
CO2: 32 mmol/L (ref 22–32)
Calcium: 9.2 mg/dL (ref 8.9–10.3)
Chloride: 96 mmol/L — ABNORMAL LOW (ref 98–111)
Creatinine, Ser: 0.88 mg/dL (ref 0.61–1.24)
GFR calc Af Amer: >60 mL/min (ref >60)
GFR calc non Af Amer: >60 mL/min (ref >60)
Glucose, Bld: 169 mg/dL — ABNORMAL HIGH (ref 70–99)
POTASSIUM: 4.2 mmol/L (ref 3.5–5.1)
Sodium: 136 mmol/L (ref 135–145)
TOTAL PROTEIN: 7.3 g/dL (ref 6.5–8.1)

## 2018-05-18 LAB — LACTATE DEHYDROGENASE: LDH: 145 U/L (ref 98–192)

## 2018-05-18 LAB — FERRITIN: FERRITIN: 171 ng/mL (ref 24–336)

## 2018-05-25 ENCOUNTER — Ambulatory Visit (HOSPITAL_COMMUNITY): Payer: 59 | Admitting: Internal Medicine

## 2018-05-25 ENCOUNTER — Inpatient Hospital Stay (HOSPITAL_BASED_OUTPATIENT_CLINIC_OR_DEPARTMENT_OTHER): Payer: 59 | Admitting: Internal Medicine

## 2018-05-25 VITALS — BP 114/40 | HR 72 | Temp 97.6°F | Resp 20 | Wt 252.0 lb

## 2018-05-25 DIAGNOSIS — J449 Chronic obstructive pulmonary disease, unspecified: Secondary | ICD-10-CM

## 2018-05-25 DIAGNOSIS — Z7982 Long term (current) use of aspirin: Secondary | ICD-10-CM

## 2018-05-25 DIAGNOSIS — D72829 Elevated white blood cell count, unspecified: Secondary | ICD-10-CM

## 2018-05-25 DIAGNOSIS — J984 Other disorders of lung: Secondary | ICD-10-CM

## 2018-05-25 DIAGNOSIS — I471 Supraventricular tachycardia: Secondary | ICD-10-CM

## 2018-05-25 DIAGNOSIS — K219 Gastro-esophageal reflux disease without esophagitis: Secondary | ICD-10-CM

## 2018-05-25 DIAGNOSIS — Z794 Long term (current) use of insulin: Secondary | ICD-10-CM

## 2018-05-25 DIAGNOSIS — M4800 Spinal stenosis, site unspecified: Secondary | ICD-10-CM

## 2018-05-25 DIAGNOSIS — F329 Major depressive disorder, single episode, unspecified: Secondary | ICD-10-CM

## 2018-05-25 DIAGNOSIS — I110 Hypertensive heart disease with heart failure: Secondary | ICD-10-CM

## 2018-05-25 DIAGNOSIS — I5033 Acute on chronic diastolic (congestive) heart failure: Secondary | ICD-10-CM

## 2018-05-25 DIAGNOSIS — Z79899 Other long term (current) drug therapy: Secondary | ICD-10-CM

## 2018-05-25 DIAGNOSIS — E119 Type 2 diabetes mellitus without complications: Secondary | ICD-10-CM

## 2018-05-25 DIAGNOSIS — K909 Intestinal malabsorption, unspecified: Secondary | ICD-10-CM

## 2018-05-25 DIAGNOSIS — D500 Iron deficiency anemia secondary to blood loss (chronic): Secondary | ICD-10-CM

## 2018-05-25 DIAGNOSIS — G629 Polyneuropathy, unspecified: Secondary | ICD-10-CM

## 2018-05-25 DIAGNOSIS — Z9981 Dependence on supplemental oxygen: Secondary | ICD-10-CM

## 2018-05-25 NOTE — Progress Notes (Signed)
While ambulating patient , oxygen saturation went down to 87% on room air.

## 2018-05-25 NOTE — Progress Notes (Signed)
Diagnosis Iron deficiency anemia due to chronic blood loss - Plan: CBC with Differential/Platelet, Comprehensive metabolic panel, Lactate dehydrogenase, Ferritin, Protein electrophoresis, serum  Staging Cancer Staging No matching staging information was found for the patient.  Assessment and Plan:  1.  IDA.  Labs done 05/18/2018 reviewed and showed WBC 10.9 HB 1 plts 433,000.  Chemistries WNL with K+ 4.2 Cr 0.88 and normal LFTs.  Ferritin is 171.  Pt was last treated with IV iron on 04/2018.  He denies any blood in stool or urine.  Pt will RTC in 4 weeks for follow-up and repeat labs.   2.  Leukocytosis and thrombocytosis.  White count 10.9 platelets 433,000.  He has undergone myeloproliferative workup in the past with Dr. Talbert Cage which was negative.  Will RTC in 06/2018 for follow-up and repeat labs.   3.  Oxygen concerns.  Pt is on home O2. He desires portable oxygen instead of oxygen tank.   Pulse ox decreased 87% with ambulation of room air.  Have spoken with nursing.  Form completed and submitted for portable O2.  Pt had CT chest done 04/22/2018 that showed pulmonary edema.  Pt should follow-up with pulmonary as recommended.    4  HTN.  BP is 114/40.  Follow-up with PCP.    5.  Health maintenance.  Follow with GI as recommended.  Greater than 25 minutes spent with more than 50% spent in counseling and coordination of care.    Interval History: 67 year old male with iron deficiency anemia.  Last treated with IV iron in March 2018.  Current Status:  Patient is seen today for follow-up.  He is here today to go over lab studies.  He desire portable oxygen and reports pulmonary doctor has not been able to get it for him.     Problem List Patient Active Problem List   Diagnosis Date Noted  . Acute on chronic respiratory failure with hypoxia ([place]) [J96.21] 04/21/2018  . COPD with acute exacerbation ([place]) [J44.1] 04/21/2018  . SVT (supraventricular tachycardia) ([place]) [I47.1] 04/14/2018  .  Paroxysmal SVT (supraventricular tachycardia) ([place]) [I47.1] 04/14/2018  . Acute on chronic diastolic CHF (congestive heart failure) ([place]) [I50.33] 04/14/2018  . CHF exacerbation ([place]) [I50.9] 04/10/2018  . 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 Moderate persistent asthma, uncomplicated [U27.25] 36/64/4034  . Thrombocytosis ([place]) [D47.3] 10/09/2014  . Abdominal pain [R10.9] 09/23/2014  . Gastritis, Helicobacter pylori [V42.59, B96.81] 01/03/2014  . Insulin-requiring or dependent type II diabetes mellitus ([place]) [E11.9, Z79.4] 01/03/2014  . 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 GERD (gastroesophageal reflux disease) [K21.9] 10/18/2012  . Nausea [R11.0] 10/18/2012  . Iron deficiency anemia due to chronic blood loss [D50.0] 10/18/2012  . HIP PAIN [M25.559] 08/31/2010  . SPINAL STENOSIS [M48.00] 06/28/2010  . TENDINITIS, CALCIFIC, SHOULDER, RIGHT [M75.30] 06/14/2010  . IMPINGEMENT SYNDROME [M75.80] 06/14/2010    Past Medical History Past Medical History:  Diagnosis Date  . Depression   . Dextrocardia   . Diabetes mellitus   . H. pylori infection 11/09/2012   treated with pylera  . HTN (hypertension)    Cholesterol  . Iron deficiency anemia, unspecified 10/18/2012  . Neuropathy   . Pneumonia   . Tachycardia     Past Surgical History Past Surgical History:  Procedure Laterality Date  . CATARACT EXTRACTION, BILATERAL  2016  . COLONOSCOPY WITH ESOPHAGOGASTRODUODENOSCOPY (EGD) N/A 11/09/2012   Dr. 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 hpylori on bx- treated with pylera. TCS- normal rectum. 1 diminutive polyp in the mid descending segment. 1-64m polyp in the mid desending segment o/w the remainder of the colonic mucosa appeared normal. tubular adenoma on bx  . GIVENS CAPSULE STUDY N/A 10/07/2013    no source for anemia or heme positive stool noted  . NECK SURGERY     bone spurs    Family History Family History  Problem Relation Age of Onset  . Diabetes Sister        Fx  . Heart defect Sister        Fx  . Arthritis Sister        Fx  . Asthma Sister        Fx  . Kidney disease Sister        Fx  . Pancreatitis Sister      Social History  reports that he has never smoked. His smokeless tobacco use includes chew. He reports that he does not drink alcohol or use drugs.  Medications  Current Outpatient Medications:  .  albuterol (PROAIR HFA) 108 (90 Base) MCG/ACT inhaler, Inhale 2 puffs into the lungs every 4 (four) hours as needed for wheezing or shortness of breath., Disp: 1 Inhaler, Rfl: 2 .  aspirin EC 81 MG tablet, Take 81 mg by mouth daily., Disp: , Rfl:  .  azelastine (ASTELIN) 0.1 % nasal spray, Place 2 sprays into both nostrils 2 (two) times daily. Use in each nostril as directed, Disp: 30 mL, Rfl: 5 .  budesonide-formoterol (SYMBICORT) 160-4.5 MCG/ACT inhaler, Inhale 2 puffs into the lungs 2 (two) times daily., Disp: 1 Inhaler, Rfl: 5 .  DEXILANT 60 MG capsule, TAKE (1) CAPSULE BY MOUTH EVERY DAY., Disp: 90 capsule, Rfl: 3 .  DULoxetine (CYMBALTA) 60 MG capsule, Take 60 mg by mouth daily., Disp: , Rfl:  .  folic acid (FOLVITE) 1 MG tablet, Take 1 mg by mouth daily., Disp: , Rfl:  .  furosemide (LASIX) 40 MG tablet, Take 1.5 tablets (60 mg total) by mouth daily., Disp: 45 tablet, Rfl: 3 .  gabapentin (NEURONTIN) 600 MG tablet, Take 1 tablet (600 mg total) by mouth 2 (two) times daily. (Patient taking differently: Take 600 mg by mouth 3 (three) times daily. ), Disp: 20 tablet, Rfl: 0 .  guaiFENesin (MUCINEX) 600 MG 12 hr tablet, Take 2 tablets (1,200 mg total) by mouth 2 (two) times daily., Disp: 20 tablet, Rfl: 0 .  insulin detemir (LEVEMIR) 100 UNIT/ML injection, Inject 15-60 Units into the skin 2 (two) times daily. 15 units in the am and 60 units at qhs, Disp: , Rfl:   .  losartan (COZAAR) 100 MG tablet, Take 100 mg by mouth daily., Disp: , Rfl:  .  metFORMIN (GLUCOPHAGE) 1000 MG tablet, Take 1,000 mg by mouth 2 (two) times daily with a meal., Disp: , Rfl:  .  metoprolol tartrate (LOPRESSOR) 25 MG tablet, Take 0.5 tablets (12.5 mg total) by mouth 2 (two) times daily., Disp: 60 tablet, Rfl: 1 .  Multiple Vitamin (MULTIVITAMIN) tablet, Take 1 tablet by mouth daily., Disp: , Rfl:  .  pioglitazone (ACTOS) 45 MG tablet, Take 45 mg by mouth daily., Disp: , Rfl:  .  rOPINIRole (REQUIP) 1 MG tablet, Take 1 mg by mouth at bedtime., Disp: , Rfl:  .  rosuvastatin (CRESTOR) 40 MG tablet, Take 1 tablet by mouth every evening., Disp: , Rfl:  .  sitaGLIPtin (JANUVIA)  100 MG tablet, Take 100 mg by mouth daily. , Disp: , Rfl:  .  traMADol (ULTRAM) 50 MG tablet, Take 1 tablet (50 mg total) by mouth every 6 (six) hours as needed., Disp: 10 tablet, Rfl: 0  Allergies Patient has no known allergies.  Review of Systems Review of Systems - Oncology ROS SOB.     Physical Exam  Vitals Wt Readings from Last 3 Encounters:  05/25/18 252 lb (114.3 kg)  04/27/18 256 lb (116.1 kg)  04/23/18 253 lb 15.5 oz (115.2 kg)   Temp Readings from Last 3 Encounters:  05/25/18 97.6 F (36.4 C) (Oral)  04/24/18 98.4 F (36.9 C) (Oral)  04/15/18 98 F (36.7 C) (Oral)   BP Readings from Last 3 Encounters:  05/25/18 (!) 114/40  04/27/18 124/60  04/24/18 (!) 137/57   Pulse Readings from Last 3 Encounters:  05/25/18 72  04/27/18 80  04/24/18 82   Constitutional: Well-developed, well-nourished, and in no distress.   HENT:Head: Normocephalic and atraumatic.  Mouth/Throat: No oropharyngeal exudate. Mucosa moist. Eyes: Pupils are equal, round, and reactive to light. Conjunctivae are normal. No scleral icterus.  Neck: Normal range of motion. Neck supple. No JVD present.  Cardiovascular: Normal rate, regular rhythm and normal heart sounds.  Exam reveals no gallop and no friction rub.    No murmur heard. Pulmonary/Chest: Coarse BS bilaterally Abdominal: Soft. Bowel sounds are normal. No distension. There is no tenderness. There is no guarding.  Musculoskeletal: No edema or tenderness.  Lymphadenopathy: No cervical, axillary or supraclavicular adenopathy.  Neurological: Alert and oriented to person, place, and time. No cranial nerve deficit.  Skin: Skin is warm and dry. No rash noted. No erythema. No pallor.  Psychiatric: Affect and judgment normal.   Labs No visits with results within 3 Day(s) from this visit.  Latest known visit with results is:  Appointment on 05/18/2018  Component Date Value Ref Range Status  . WBC 05/18/2018 10.9* 4.0 - 10.5 K/uL Final  . RBC 05/18/2018 3.44* 4.22 - 5.81 MIL/uL Final  . Hemoglobin 05/18/2018 10.0* 13.0 - 17.0 g/dL Final  . HCT 05/18/2018 32.0* 39.0 - 52.0 % Final  . MCV 05/18/2018 93.0  78.0 - 100.0 fL Final  . MCH 05/18/2018 29.1  26.0 - 34.0 pg Final  . MCHC 05/18/2018 31.3  30.0 - 36.0 g/dL Final  . RDW 05/18/2018 14.3  11.5 - 15.5 % Final  . Platelets 05/18/2018 433* 150 - 400 K/uL Final  . Neutrophils Relative % 05/18/2018 67  % Final  . Neutro Abs 05/18/2018 7.3  1.7 - 7.7 K/uL Final  . Lymphocytes Relative 05/18/2018 24  % Final  . Lymphs Abs 05/18/2018 2.6  0.7 - 4.0 K/uL Final  . Monocytes Relative 05/18/2018 8  % Final  . Monocytes Absolute 05/18/2018 0.8  0.1 - 1.0 K/uL Final  . Eosinophils Relative 05/18/2018 1  % Final  . Eosinophils Absolute 05/18/2018 0.1  0.0 - 0.7 K/uL Final  . Basophils Relative 05/18/2018 0  % Final  . Basophils Absolute 05/18/2018 0.0  0.0 - 0.1 K/uL Final   Performed at [facility], [address], [place], [place] [address]  . Sodium 05/18/2018 136  135 - 145 mmol/L Final  . Potassium 05/18/2018 4.2  3.5 - 5.1 mmol/L Final  . Chloride 05/18/2018 96* 98 - 111 mmol/L Final  . CO2 05/18/2018 32  22 - 32 mmol/L Final  . Glucose, Bld 05/18/2018 169* 70 - 99 mg/dL Final  . BUN 05/18/2018  8   8 - 23 mg/dL Final  . Creatinine, Ser 05/18/2018 0.88  0.61 - 1.24 mg/dL Final  . Calcium 05/18/2018 9.2  8.9 - 10.3 mg/dL Final  . Total Protein 05/18/2018 7.3  6.5 - 8.1 g/dL Final  . Albumin 05/18/2018 4.1  3.5 - 5.0 g/dL Final  . AST 05/18/2018 21  15 - 41 U/L Final  . ALT 05/18/2018 16  0 - 44 U/L Final  . Alkaline Phosphatase 05/18/2018 39  38 - 126 U/L Final  . Total Bilirubin 05/18/2018 0.5  0.3 - 1.2 mg/dL Final  . GFR calc non Af Amer 05/18/2018 >60  >60 mL/min Final  . GFR calc Af Amer 05/18/2018 >60  >60 mL/min Final   Comment: (NOTE) The eGFR has been calculated using the CKD EPI equation. This calculation has not been validated in all clinical situations. eGFR's persistently <60 mL/min signify possible Chronic Kidney Disease.   Georgiann Hahn gap 05/18/2018 8  5 - 15 Final   Performed at [facility] [address] [place], [place] [address]  . Ferritin 05/18/2018 171  24 - 336 ng/mL Final   Performed at [facility], [address] [place], [place] [address]  . LDH 05/18/2018 145  98 - 192 U/L Final   Performed at [facility], [address], [place], [place] [address]     Pathology Orders Placed This Encounter  Procedures  . CBC with Differential/Platelet    Standing Status:   Future    Standing Expiration Date:   05/26/2019  . Comprehensive metabolic panel    Standing Status:   Future    Standing Expiration Date:   05/26/2019  . Lactate dehydrogenase    Standing Status:   Future    Standing Expiration Date:   05/26/2019  . Ferritin    Standing Status:   Future    Standing Expiration Date:   05/26/2019  . Protein electrophoresis, serum    Standing Status:   Future    Standing Expiration Date:   05/26/2019       Zoila Shutter MD

## 2018-05-25 NOTE — Patient Instructions (Signed)
[place] at [facility]  Discharge Instructions:  You were seen by dr. Walden Field. Today.  _______________________________________________________________  Thank you for choosing [place] at [facility] to provide your oncology and hematology care.  To afford each patient quality time with our providers, please arrive at least 15 minutes before your scheduled appointment.  You need to re-schedule your appointment if you arrive 10 or more minutes late.  We strive to give you quality time with our providers, and arriving late affects you and other patients whose appointments are after yours.  Also, if you no show three or more times for appointments you may be dismissed from the clinic.  Again, thank you for choosing [place] at [place] hope is that these requests will allow you access to exceptional care and in a timely manner. _______________________________________________________________  If you have questions after your visit, please contact our office at [phone] between the hours of 8:30 a.m. and 5:00 p.m. Voicemails left after 4:30 p.m. will not be returned until the following business day. _______________________________________________________________  For prescription refill requests, have your pharmacy contact our office. _______________________________________________________________  Recommendations made by the consultant and any test results will be sent to your referring physician. _______________________________________________________________

## 2018-06-04 ENCOUNTER — Other Ambulatory Visit: Payer: Self-pay | Admitting: Cardiology

## 2018-06-05 LAB — BASIC METABOLIC PANEL
BUN: 13 mg/dL (ref 7–25)
CHLORIDE: 98 mmol/L (ref 98–110)
CO2: 29 mmol/L (ref 20–32)
Calcium: 10.1 mg/dL (ref 8.6–10.3)
Creat: 1.04 mg/dL (ref 0.70–1.25)
Glucose, Bld: 171 mg/dL — ABNORMAL HIGH (ref 65–99)
Potassium: 5.3 mmol/L (ref 3.5–5.3)
Sodium: 137 mmol/L (ref 135–146)

## 2018-06-07 ENCOUNTER — Telehealth: Payer: Self-pay | Admitting: *Deleted

## 2018-06-07 NOTE — Telephone Encounter (Signed)
-----   Message from Arnoldo Lenis, MD sent at 06/07/2018 11:11 AM EDT ----- Labs look fine  Zandra Abts MD

## 2018-06-08 ENCOUNTER — Ambulatory Visit (HOSPITAL_COMMUNITY)
Admission: RE | Admit: 2018-06-08 | Discharge: 2018-06-08 | Disposition: A | Discharge status: Home / Self Care | Payer: 59 | Source: Ambulatory Visit | Attending: Cardiology | Admitting: Cardiology

## 2018-06-08 ENCOUNTER — Encounter (HOSPITAL_COMMUNITY): Payer: Self-pay

## 2018-06-08 DIAGNOSIS — I700 Atherosclerosis of aorta: Secondary | ICD-10-CM | POA: Diagnosis not present

## 2018-06-08 DIAGNOSIS — I5032 Chronic diastolic (congestive) heart failure: Secondary | ICD-10-CM | POA: Diagnosis not present

## 2018-06-08 DIAGNOSIS — J984 Other disorders of lung: Secondary | ICD-10-CM | POA: Diagnosis not present

## 2018-06-08 DIAGNOSIS — J90 Pleural effusion, not elsewhere classified: Secondary | ICD-10-CM | POA: Diagnosis not present

## 2018-06-08 DIAGNOSIS — Q893 Situs inversus: Secondary | ICD-10-CM | POA: Insufficient documentation

## 2018-06-08 DIAGNOSIS — R0602 Shortness of breath: Secondary | ICD-10-CM | POA: Diagnosis present

## 2018-06-08 MED ORDER — METOPROLOL TARTRATE 5 MG/5ML IV SOLN
INTRAVENOUS | Status: AC
  Administered 2018-06-08: 5 mg
  Filled 2018-06-08: qty 5

## 2018-06-08 MED ORDER — NITROGLYCERIN 0.4 MG SL SUBL
SUBLINGUAL_TABLET | SUBLINGUAL | Status: AC
  Administered 2018-06-08: 0.8 mg
  Filled 2018-06-08: qty 2

## 2018-06-08 MED ORDER — DILTIAZEM HCL 25 MG/5ML IV SOLN
INTRAVENOUS | Status: AC
  Filled 2018-06-08: qty 5

## 2018-06-08 MED ORDER — DILTIAZEM HCL 25 MG/5ML IV SOLN
5.0000 mg | Freq: Once | INTRAVENOUS | Status: DC
  Filled 2018-06-08: qty 5

## 2018-06-08 MED ORDER — IOPAMIDOL (ISOVUE-370) INJECTION 76%
100.0000 mL | Freq: Once | INTRAVENOUS | Status: AC | PRN
  Administered 2018-06-08: 80 mL via INTRAVENOUS

## 2018-06-08 NOTE — Telephone Encounter (Signed)
Patient informed. 

## 2018-06-10 DIAGNOSIS — I5032 Chronic diastolic (congestive) heart failure: Secondary | ICD-10-CM | POA: Diagnosis not present

## 2018-06-13 ENCOUNTER — Telehealth: Payer: Self-pay | Admitting: *Deleted

## 2018-06-13 NOTE — Telephone Encounter (Signed)
-----   Message from Arnoldo Lenis, MD sent at 06/11/2018  2:26 PM EDT ----- CT shows some evidence of a blockage in one of the arteries of the heart. This is something we will talk about in detail at our follow up, pending on symptoms it may require further testing. If symptoms are stable something we can monitor for the time being. Will discuss at our f/u in late October.   Zandra Abts MD

## 2018-06-13 NOTE — Telephone Encounter (Signed)
Called patient with test results. No answer. Left message to call back.  

## 2018-06-14 ENCOUNTER — Other Ambulatory Visit: Payer: Self-pay | Admitting: Orthopedic Surgery

## 2018-06-14 DIAGNOSIS — M545 Low back pain, unspecified: Secondary | ICD-10-CM

## 2018-06-15 ENCOUNTER — Ambulatory Visit: Payer: 59 | Admitting: Cardiology

## 2018-06-18 ENCOUNTER — Ambulatory Visit
Admission: RE | Admit: 2018-06-18 | Discharge: 2018-06-18 | Disposition: A | Discharge status: Home / Self Care | Payer: 59 | Source: Ambulatory Visit | Attending: Orthopedic Surgery | Admitting: Orthopedic Surgery

## 2018-06-18 DIAGNOSIS — M545 Low back pain, unspecified: Secondary | ICD-10-CM

## 2018-06-20 ENCOUNTER — Encounter: Payer: Self-pay | Admitting: Cardiology

## 2018-06-20 ENCOUNTER — Ambulatory Visit (INDEPENDENT_AMBULATORY_CARE_PROVIDER_SITE_OTHER): Payer: 59 | Admitting: Cardiology

## 2018-06-20 ENCOUNTER — Inpatient Hospital Stay (HOSPITAL_COMMUNITY): Payer: 59 | Attending: Hematology

## 2018-06-20 VITALS — BP 140/60 | HR 79 | Ht 67.0 in | Wt 258.0 lb

## 2018-06-20 DIAGNOSIS — J441 Chronic obstructive pulmonary disease with (acute) exacerbation: Secondary | ICD-10-CM | POA: Insufficient documentation

## 2018-06-20 DIAGNOSIS — Z23 Encounter for immunization: Secondary | ICD-10-CM | POA: Diagnosis not present

## 2018-06-20 DIAGNOSIS — M4800 Spinal stenosis, site unspecified: Secondary | ICD-10-CM | POA: Diagnosis not present

## 2018-06-20 DIAGNOSIS — Z79899 Other long term (current) drug therapy: Secondary | ICD-10-CM

## 2018-06-20 DIAGNOSIS — D72829 Elevated white blood cell count, unspecified: Secondary | ICD-10-CM | POA: Diagnosis not present

## 2018-06-20 DIAGNOSIS — K219 Gastro-esophageal reflux disease without esophagitis: Secondary | ICD-10-CM | POA: Insufficient documentation

## 2018-06-20 DIAGNOSIS — D500 Iron deficiency anemia secondary to blood loss (chronic): Secondary | ICD-10-CM | POA: Insufficient documentation

## 2018-06-20 DIAGNOSIS — F329 Major depressive disorder, single episode, unspecified: Secondary | ICD-10-CM | POA: Diagnosis not present

## 2018-06-20 DIAGNOSIS — J4540 Moderate persistent asthma, uncomplicated: Secondary | ICD-10-CM | POA: Insufficient documentation

## 2018-06-20 DIAGNOSIS — I471 Supraventricular tachycardia: Secondary | ICD-10-CM | POA: Insufficient documentation

## 2018-06-20 DIAGNOSIS — I5033 Acute on chronic diastolic (congestive) heart failure: Secondary | ICD-10-CM | POA: Diagnosis not present

## 2018-06-20 DIAGNOSIS — I110 Hypertensive heart disease with heart failure: Secondary | ICD-10-CM | POA: Insufficient documentation

## 2018-06-20 DIAGNOSIS — M79604 Pain in right leg: Secondary | ICD-10-CM | POA: Diagnosis not present

## 2018-06-20 DIAGNOSIS — Z794 Long term (current) use of insulin: Secondary | ICD-10-CM | POA: Insufficient documentation

## 2018-06-20 DIAGNOSIS — M79605 Pain in left leg: Secondary | ICD-10-CM

## 2018-06-20 DIAGNOSIS — Q893 Situs inversus: Secondary | ICD-10-CM | POA: Insufficient documentation

## 2018-06-20 DIAGNOSIS — I2510 Atherosclerotic heart disease of native coronary artery without angina pectoris: Secondary | ICD-10-CM

## 2018-06-20 DIAGNOSIS — E1140 Type 2 diabetes mellitus with diabetic neuropathy, unspecified: Secondary | ICD-10-CM | POA: Insufficient documentation

## 2018-06-20 DIAGNOSIS — Z9981 Dependence on supplemental oxygen: Secondary | ICD-10-CM | POA: Diagnosis not present

## 2018-06-20 LAB — CBC WITH DIFFERENTIAL/PLATELET
Abs Immature Granulocytes: 0.05 10*3/uL (ref 0.00–0.07)
Basophils Absolute: 0 10*3/uL (ref 0.0–0.1)
Basophils Relative: 0 %
EOS ABS: 0.1 10*3/uL (ref 0.0–0.5)
Eosinophils Relative: 1 %
HEMATOCRIT: 32.3 % — AB (ref 39.0–52.0)
HEMOGLOBIN: 9.6 g/dL — AB (ref 13.0–17.0)
IMMATURE GRANULOCYTES: 1 %
Lymphocytes Relative: 18 %
Lymphs Abs: 2 10*3/uL (ref 0.7–4.0)
MCH: 28.6 pg (ref 26.0–34.0)
MCHC: 29.7 g/dL — ABNORMAL LOW (ref 30.0–36.0)
MCV: 96.1 fL (ref 80.0–100.0)
MONOS PCT: 8 %
Monocytes Absolute: 0.8 10*3/uL (ref 0.1–1.0)
NEUTROS ABS: 7.9 10*3/uL — AB (ref 1.7–7.7)
NEUTROS PCT: 72 %
NRBC: 0.2 % (ref 0.0–0.2)
Platelets: 503 10*3/uL — ABNORMAL HIGH (ref 150–400)
RBC: 3.36 MIL/uL — AB (ref 4.22–5.81)
RDW: 15.7 % — AB (ref 11.5–15.5)
WBC: 10.9 10*3/uL — AB (ref 4.0–10.5)

## 2018-06-20 LAB — COMPREHENSIVE METABOLIC PANEL
ALBUMIN: 4.5 g/dL (ref 3.5–5.0)
ALT: 13 U/L (ref 0–44)
AST: 15 U/L (ref 15–41)
Alkaline Phosphatase: 42 U/L (ref 38–126)
Anion gap: 11 (ref 5–15)
BILIRUBIN TOTAL: 0.6 mg/dL (ref 0.3–1.2)
BUN: 12 mg/dL (ref 8–23)
CO2: 31 mmol/L (ref 22–32)
Calcium: 9.9 mg/dL (ref 8.9–10.3)
Chloride: 97 mmol/L — ABNORMAL LOW (ref 98–111)
Creatinine, Ser: 1.23 mg/dL (ref 0.61–1.24)
GFR calc Af Amer: >60 mL/min (ref >60)
GFR calc non Af Amer: 60 mL/min — ABNORMAL LOW (ref >60)
GLUCOSE: 114 mg/dL — AB (ref 70–99)
POTASSIUM: 4.8 mmol/L (ref 3.5–5.1)
Sodium: 139 mmol/L (ref 135–145)
TOTAL PROTEIN: 8.1 g/dL (ref 6.5–8.1)

## 2018-06-20 LAB — LACTATE DEHYDROGENASE: LDH: 142 U/L (ref 98–192)

## 2018-06-20 LAB — FERRITIN: Ferritin: 147 ng/mL (ref 24–336)

## 2018-06-20 MED ORDER — FUROSEMIDE 40 MG PO TABS: ORAL_TABLET | ORAL | 3 refills | Status: DC

## 2018-06-20 NOTE — Patient Instructions (Signed)
Medication Instructions:  INCREASE LASIX to 60 mg in the AM , 40 mg in the PM  If you need a refill on your cardiac medications before your next appointment, please call your pharmacy.   Lab work: 2 [place]  If you have labs (blood work) drawn today and your tests are completely normal, you will receive your results only by: Marland Kitchen MyChart Message (if you have MyChart) OR . A paper copy in the mail If you have any lab test that is abnormal or we need to change your treatment, we will call you to review the results.  Testing/Procedures: Your physician has requested that you have an ankle brachial index (ABI). During this test an ultrasound and blood pressure cuff are used to evaluate the arteries that supply the arms and legs with blood. Allow thirty minutes for this exam. There are no restrictions or special instructions.    Follow-Up: At [facility], you and your health needs are our priority.  As part of our continuing mission to provide you with exceptional heart care, we have created designated Provider Care Teams.  These Care Teams include your primary Cardiologist (physician) and Advanced Practice Providers (APPs -  Physician Assistants and Nurse Practitioners) who all work together to provide you with the care you need, when you need it. You will need a follow up appointment in 1 months.  Please call our office 2 months in advance to schedule this appointment.  You may see Carlyle Dolly, MD or one of the following Advanced Practice Providers on your designated Care Team:   Bernerd Pho, PA-C [facility]) . Ermalinda Barrios, PA-C ([place])  Any Other Special Instructions Will Be Listed Below (If Applicable).

## 2018-06-20 NOTE — Progress Notes (Signed)
Clinical Summary Brett [name] is a 67 y.o.male  seen today for follow up of the following medical problems.     1. Chronic diastolic HF - admisssion 09/17/08 with SOB. Mutlfactorial including diastolic HF but also related to his chronic lung disease,. He was diuresed, also managed with bronchodilators and antibiotics for respiratory infection - readmitted later in 04/2018 with recurrent SOB. Managed again for COPD/bronchiectasis and diastolic HF   - last visit we increased lasix to 60mg  daily.  - recent CT had signs of pulmonary edema.  - increased SOB since last visit. Swelling in legs has improved. Has had some stomach distension.  - compliant with meds. Does not check weights at home, by ours scales he is up from 252 to 258 lbs.    2. Elevated troponin/CAD - mild trop elevation to 0.43 in setting of HF and hypoxia, did not have typical chest pain. Thought likely demand ischemia, plans were to consider possible outpatient stress - echo normal LVEF no WMAs.   - can have some chest tightness at times. Lasts a few seconds. Can be affected by deep breathing.  - has some left sided pain. Typically positional, worst with deep breathing.  - DOE, chroincally on 4 L, recentlt up from 3L. 05/2018 coronary CTA with mid vessel stenosis, significant by FFR 0.76  3. Bronchiectasis - followed by pulmonary Dr Luan Pulling.    4. Leg pain - mostly with walking. Dull pain bilateral thighs into knees. Better with rest.  Past Medical History:  Diagnosis Date  . Depression   . Dextrocardia   . Diabetes mellitus   . H. pylori infection 11/09/2012   treated with pylera  . HTN (hypertension)    Cholesterol  . Iron deficiency anemia, unspecified 10/18/2012  . Neuropathy   . Pneumonia   . Tachycardia      No Known Allergies   Current Outpatient Medications  Medication Sig Dispense Refill  . albuterol (PROAIR HFA) 108 (90 Base) MCG/ACT inhaler Inhale 2 puffs into the lungs every 4  (four) hours as needed for wheezing or shortness of breath. 1 Inhaler 2  . aspirin EC 81 MG tablet Take 81 mg by mouth daily.    Marland Kitchen azelastine (ASTELIN) 0.1 % nasal spray Place 2 sprays into both nostrils 2 (two) times daily. Use in each nostril as directed 30 mL 5  . budesonide-formoterol (SYMBICORT) 160-4.5 MCG/ACT inhaler Inhale 2 puffs into the lungs 2 (two) times daily. 1 Inhaler 5  . DEXILANT 60 MG capsule TAKE (1) CAPSULE BY MOUTH EVERY DAY. 90 capsule 3  . DULoxetine (CYMBALTA) 60 MG capsule Take 60 mg by mouth daily.    . folic acid (FOLVITE) 1 MG tablet Take 1 mg by mouth daily.    . furosemide (LASIX) 40 MG tablet Take 1.5 tablets (60 mg total) by mouth daily. 45 tablet 3  . gabapentin (NEURONTIN) 600 MG tablet Take 1 tablet (600 mg total) by mouth 2 (two) times daily. (Patient taking differently: Take 600 mg by mouth 3 (three) times daily. ) 20 tablet 0  . guaiFENesin (MUCINEX) 600 MG 12 hr tablet Take 2 tablets (1,200 mg total) by mouth 2 (two) times daily. 20 tablet 0  . insulin detemir (LEVEMIR) 100 UNIT/ML injection Inject 15-60 Units into the skin 2 (two) times daily. 15 units in the am and 60 units at qhs    . losartan (COZAAR) 100 MG tablet Take 100 mg by mouth daily.    . metFORMIN (GLUCOPHAGE)  1000 MG tablet Take 1,000 mg by mouth 2 (two) times daily with a meal.    . metoprolol tartrate (LOPRESSOR) 25 MG tablet Take 0.5 tablets (12.5 mg total) by mouth 2 (two) times daily. 60 tablet 1  . Multiple Vitamin (MULTIVITAMIN) tablet Take 1 tablet by mouth daily.    . pioglitazone (ACTOS) 45 MG tablet Take 45 mg by mouth daily.    Marland Kitchen rOPINIRole (REQUIP) 1 MG tablet Take 1 mg by mouth at bedtime.    . rosuvastatin (CRESTOR) 40 MG tablet Take 1 tablet by mouth every evening.    . sitaGLIPtin (JANUVIA) 100 MG tablet Take 100 mg by mouth daily.     . traMADol (ULTRAM) 50 MG tablet Take 1 tablet (50 mg total) by mouth every 6 (six) hours as needed. 10 tablet 0   No current  facility-administered medications for this visit.      Past Surgical History:  Procedure Laterality Date  . CATARACT EXTRACTION, BILATERAL  2016  . COLONOSCOPY WITH ESOPHAGOGASTRODUODENOSCOPY (EGD) N/A 11/09/2012   Dr. 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 hpylori on bx- treated with pylera. TCS- normal rectum. 1 diminutive polyp in the mid descending segment. 1-42mm polyp in the mid desending segment o/w the remainder of the colonic mucosa appeared normal. tubular adenoma on bx  . GIVENS CAPSULE STUDY N/A 10/07/2013   no source for anemia or heme positive stool noted  . NECK SURGERY     bone spurs     No Known Allergies    Family History  Problem Relation Age of Onset  . Diabetes Sister        Fx  . Heart defect Sister        Fx  . Arthritis Sister        Fx  . Asthma Sister        Fx  . Kidney disease Sister        Fx  . Pancreatitis Sister      Social History Brett [name] reports that he has never smoked. His smokeless tobacco use includes chew. Brett [name] reports that he does not drink alcohol.   Review of Systems CONSTITUTIONAL: No weight loss, fever, chills, weakness or fatigue.  HEENT: Eyes: No visual loss, blurred vision, double vision or yellow sclerae.No hearing loss, sneezing, congestion, runny nose or sore throat.  SKIN: No rash or itching.  CARDIOVASCULAR: per hpi RESPIRATORY: +SOB GASTROINTESTINAL: No anorexia, nausea, vomiting or diarrhea. No abdominal pain or blood.  GENITOURINARY: No burning on urination, no polyuria NEUROLOGICAL: No headache, dizziness, syncope, paralysis, ataxia, numbness or tingling in the extremities. No change in bowel or bladder control.  MUSCULOSKELETAL: No muscle, back pain, joint pain or stiffness.  LYMPHATICS: No enlarged nodes. No history of splenectomy.  PSYCHIATRIC: No history of depression or anxiety.  ENDOCRINOLOGIC: No reports of  sweating, cold or heat intolerance. No polyuria or polydipsia.  Marland Kitchen   Physical Examination Vitals:   06/20/18 0825  BP: 140/60  Pulse: 79  SpO2: 97%   Vitals:   06/20/18 0825  Weight: 258 lb (117 kg)  Height: 5\' 7"  (1.702 m)    Gen: resting comfortably, no acute distress HEENT: no scleral icterus, pupils equal round and reactive, no palptable cervical adenopathy,  CV: RRR, no m/r/g, no jvd Resp: Clear to auscultation bilaterally GI: abdomen is soft, non-tender, non-distended, normal bowel sounds, no hepatosplenomegaly MSK: extremities are warm, trace bilateral edema  Skin: warm, no  rash Neuro:  no focal deficits Psych: appropriate affect   Diagnostic Studies   05/2018 coronary CTA FINDINGS: Non-cardiac: See separate report from [facility].  Situs inversus totalis. The heart is on the right, liver on the left.  Pulmonary veins drain normally to the left atrium.  Calcium Score: 81 Agatston units.  Coronary Arteries: Right dominant with no anomalies  LM: Calcified plaque, no significant stenosis.  LAD system: Small caliber LAD. Mixed plaque proximal to mid LAD, possible moderate stenosis.  Circumflex system: No plaque or stenosis.  RCA system: Mixed plaque distal RCA, no significant stenosis.  IMPRESSION: 1.  Situs inversus totalis.  2. Coronary artery calcium score 81 Agatston units. This places the patient in the 51st percentile for age and gender, suggesting intermediate risk for future cardiac events.  3. The LAD is small in caliber with mixed plaque in the proximal to mid portion of the vessel. Cannot rule out moderate stenosis, will send for FFR.  FINDINGS: FFR 0.76 in the mid LAD.  IMPRESSION: Suspect hemodynamically significant proximal LAD stenosis.     Assessment and Plan  1 Acute on chronic diastolic HF - weight is up, recent CT showed pulm edema. Physical exam limited by body habitus. SOB increase - we will  increase lasix to 60mg  in AM and 40mg  in PM, repeat BMET/Mg in 2 weeks.   2. Elevated troponin/CAD - elevated trop in setting of CHF and hypoxia on prior admission - suspect probable demand ischemia in setting of chronic obstructive CAD based on his recent coronary CTA which showed mid LAD disease FFR 0.76 - no chest pain, unclear how much of a role may be playing in his SOB as he has decomopensated diastolic HF and severe bronchiectasis - follow respiratory status with management of his diastolic HF and bronchiectasis, if progresses may need to consider cath. If plans for cath would need his current respiratory status to be improved.   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  - followed by Dr Luan Pulling.   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   5. Leg pains - suggestive of claudication, order ABIs   F/u 4 weeks   Arnoldo Lenis, M.D

## 2018-06-21 LAB — PROTEIN ELECTROPHORESIS, SERUM
A/G Ratio: 1.1 (ref 0.7–1.7)
ALBUMIN ELP: 3.9 g/dL (ref 2.9–4.4)
Alpha-1-Globulin: 0.3 g/dL (ref 0.0–0.4)
Alpha-2-Globulin: 1.2 g/dL — ABNORMAL HIGH (ref 0.4–1.0)
BETA GLOBULIN: 1.3 g/dL (ref 0.7–1.3)
Gamma Globulin: 0.5 g/dL (ref 0.4–1.8)
Globulin, Total: 3.4 g/dL (ref 2.2–3.9)
Total Protein ELP: 7.3 g/dL (ref 6.0–8.5)

## 2018-06-22 ENCOUNTER — Inpatient Hospital Stay (HOSPITAL_BASED_OUTPATIENT_CLINIC_OR_DEPARTMENT_OTHER): Payer: 59 | Admitting: Internal Medicine

## 2018-06-22 ENCOUNTER — Other Ambulatory Visit: Payer: Self-pay

## 2018-06-22 ENCOUNTER — Encounter (HOSPITAL_COMMUNITY): Payer: Self-pay | Admitting: Internal Medicine

## 2018-06-22 VITALS — BP 154/64 | HR 88 | Temp 98.3°F | Resp 20 | Wt 253.0 lb

## 2018-06-22 DIAGNOSIS — I110 Hypertensive heart disease with heart failure: Secondary | ICD-10-CM | POA: Diagnosis not present

## 2018-06-22 DIAGNOSIS — F329 Major depressive disorder, single episode, unspecified: Secondary | ICD-10-CM

## 2018-06-22 DIAGNOSIS — I471 Supraventricular tachycardia: Secondary | ICD-10-CM

## 2018-06-22 DIAGNOSIS — J4540 Moderate persistent asthma, uncomplicated: Secondary | ICD-10-CM

## 2018-06-22 DIAGNOSIS — D500 Iron deficiency anemia secondary to blood loss (chronic): Secondary | ICD-10-CM

## 2018-06-22 DIAGNOSIS — Z9981 Dependence on supplemental oxygen: Secondary | ICD-10-CM

## 2018-06-22 DIAGNOSIS — K219 Gastro-esophageal reflux disease without esophagitis: Secondary | ICD-10-CM

## 2018-06-22 DIAGNOSIS — Z794 Long term (current) use of insulin: Secondary | ICD-10-CM

## 2018-06-22 DIAGNOSIS — I2510 Atherosclerotic heart disease of native coronary artery without angina pectoris: Secondary | ICD-10-CM | POA: Diagnosis not present

## 2018-06-22 DIAGNOSIS — Z79899 Other long term (current) drug therapy: Secondary | ICD-10-CM

## 2018-06-22 DIAGNOSIS — E1140 Type 2 diabetes mellitus with diabetic neuropathy, unspecified: Secondary | ICD-10-CM

## 2018-06-22 DIAGNOSIS — D72829 Elevated white blood cell count, unspecified: Secondary | ICD-10-CM | POA: Diagnosis not present

## 2018-06-22 DIAGNOSIS — J441 Chronic obstructive pulmonary disease with (acute) exacerbation: Secondary | ICD-10-CM

## 2018-06-22 DIAGNOSIS — M4800 Spinal stenosis, site unspecified: Secondary | ICD-10-CM

## 2018-06-22 DIAGNOSIS — Q893 Situs inversus: Secondary | ICD-10-CM

## 2018-06-22 NOTE — Progress Notes (Signed)
Diagnosis Iron deficiency anemia due to chronic blood loss - Plan: CBC with Differential/Platelet, Comprehensive metabolic panel, Lactate dehydrogenase, Protein electrophoresis, serum, Ferritin, Vitamin B12, Methylmalonic acid, serum, Folate  Staging Cancer Staging No matching staging information was found for the patient.  Assessment and Plan:  1.  IDA.   Pt was last treated with IV iron 04/2018.    Labs done 06/20/2018 reviewed and showed WBC 10.9 HB 9.6 plts 503,000.  Chemistries WNL with K+ 4.8, Cr 1.23 and normal lFTS.  Iron level WNL at 147.  Pt reports he has been recommended for procedure due to blockage in heart.  He will RTC in 08/2018 for follow-up and repeat labs.  Pt will be recommended for transfusion if HB < 7.  SPEP negative on labs done 06/2018.    2.  Leukocytosis and thrombocytosis.  White count stable at  10.9 platelets 503,000.  He has undergone myeloproliferative workup in the past with Dr. Talbert Cage which was negative.  Pt will have repeat labs on RTC 08/2018.    3.  Oxygen therapy.  Pt is on home O2. He desired portable oxygen instead of oxygen tank.   Our office facilitated pt getting portable oxygen.  Follow-up with Dr. Luan Pulling of pulmonary as recommended.    4  HTN.  BP is 154/64.   Follow-up with PCP.    5.  CAD.  Pt reports he was told he has blockage on heart.  He should follow-up with cardiology as directed.    6.  Health maintenance.  Follow with GI as recommended.  Interval History: 67 year old male with iron deficiency anemia.   Current Status:  Patient is seen today for follow-up.  He reports he has a heart blockage and will have cardiac procedure.    Problem List Patient Active Problem List   Diagnosis Date Noted  . Acute on chronic respiratory failure with hypoxia ([place]) [J96.21] 04/21/2018  . COPD with acute exacerbation ([place]) [J44.1] 04/21/2018  . SVT (supraventricular tachycardia) ([place]) [I47.1] 04/14/2018  . Paroxysmal SVT (supraventricular  tachycardia) ([place]) [I47.1] 04/14/2018  . Acute on chronic diastolic CHF (congestive heart failure) ([place]) [I50.33] 04/14/2018  . CHF exacerbation ([place]) [I50.9] 04/10/2018  . 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 Moderate persistent asthma, uncomplicated [M42.68] 34/19/6222  . Thrombocytosis ([place]) [D47.3] 10/09/2014  . Abdominal pain [R10.9] 09/23/2014  . Gastritis, Helicobacter pylori [L79.89, B96.81] 01/03/2014  . Insulin-requiring or dependent type II diabetes mellitus ([place]) [E11.9, Z79.4] 01/03/2014  . 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 GERD (gastroesophageal reflux disease) [K21.9] 10/18/2012  . Nausea [R11.0] 10/18/2012  . Iron deficiency anemia due to chronic blood loss [D50.0] 10/18/2012  . HIP PAIN [M25.559] 08/31/2010  . SPINAL STENOSIS [M48.00] 06/28/2010  . TENDINITIS, CALCIFIC, SHOULDER, RIGHT [M75.30] 06/14/2010  . IMPINGEMENT SYNDROME [M75.80] 06/14/2010    Past Medical History Past Medical History:  Diagnosis Date  . Depression   . Dextrocardia   . Diabetes mellitus   . H. pylori infection 11/09/2012   treated with pylera  . HTN (hypertension)    Cholesterol  . Iron deficiency anemia, unspecified 10/18/2012  . Neuropathy   . Pneumonia   . Tachycardia     Past Surgical History Past Surgical History:  Procedure Laterality Date  . CATARACT EXTRACTION, BILATERAL  2016  . COLONOSCOPY WITH ESOPHAGOGASTRODUODENOSCOPY (EGD) N/A 11/09/2012   Dr. 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 hpylori on bx- treated with pylera. TCS- normal rectum. 1 diminutive polyp in the mid descending segment. 1-77m polyp in the mid desending segment o/w the remainder of the colonic mucosa appeared normal. tubular adenoma on bx  . GIVENS CAPSULE STUDY N/A 10/07/2013   no source for anemia or heme  positive stool noted  . NECK SURGERY     bone spurs    Family History Family History  Problem Relation Age of Onset  . Diabetes Sister        Fx  . Heart defect Sister        Fx  . Arthritis Sister        Fx  . Asthma Sister        Fx  . Kidney disease Sister        Fx  . Pancreatitis Sister      Social History  reports that he has never smoked. His smokeless tobacco use includes chew. He reports that he does not drink alcohol or use drugs.  Medications  Current Outpatient Medications:  .  albuterol (PROAIR HFA) 108 (90 Base) MCG/ACT inhaler, Inhale 2 puffs into the lungs every 4 (four) hours as needed for wheezing or shortness of breath., Disp: 1 Inhaler, Rfl: 2 .  aspirin EC 81 MG tablet, Take 81 mg by mouth daily., Disp: , Rfl:  .  azelastine (ASTELIN) 0.1 % nasal spray, Place 2 sprays into both nostrils 2 (two) times daily. Use in each nostril as directed, Disp: 30 mL, Rfl: 5 .  budesonide-formoterol (SYMBICORT) 160-4.5 MCG/ACT inhaler, Inhale 2 puffs into the lungs 2 (two) times daily., Disp: 1 Inhaler, Rfl: 5 .  DEXILANT 60 MG capsule, TAKE (1) CAPSULE BY MOUTH EVERY DAY., Disp: 90 capsule, Rfl: 3 .  doxycycline (VIBRAMYCIN) 100 MG capsule, Take 100 mg by mouth 2 (two) times daily., Disp: , Rfl:  .  DULoxetine (CYMBALTA) 60 MG capsule, Take 60 mg by mouth daily., Disp: , Rfl:  .  fenofibrate (TRICOR) 145 MG tablet, , Disp: , Rfl:  .  fluticasone (VERAMYST) 27.5 MCG/SPRAY nasal spray, Place 2 sprays into the nose daily., Disp: , Rfl:  .  folic acid (FOLVITE) 1 MG tablet, Take 1 mg by mouth daily., Disp: , Rfl:  .  furosemide (LASIX) 40 MG tablet, Take 60 mg in the morning, take 40 mg in the evening., Disp: 225 tablet, Rfl: 3 .  gabapentin (NEURONTIN) 600 MG tablet, Take 1 tablet (600 mg total) by mouth 2 (two) times daily. (Patient taking differently: Take 600 mg by mouth 3 (three) times daily. ), Disp: 20 tablet, Rfl: 0 .  guaiFENesin (MUCINEX) 600 MG 12 hr tablet, Take 2  tablets (1,200 mg total) by mouth 2 (two) times daily., Disp: 20 tablet, Rfl: 0 .  insulin detemir (LEVEMIR) 100 UNIT/ML injection, Inject 15-60 Units into the skin 2 (two) times daily. 15 units in the am and 60 units at qhs, Disp: , Rfl:  .  losartan (COZAAR) 100 MG tablet, Take 100 mg by mouth daily., Disp: , Rfl:  .  Magnesium 400 MG TABS, Take by mouth., Disp: , Rfl:  .  metFORMIN (GLUCOPHAGE) 1000 MG tablet, Take 1,000 mg by mouth 2 (two) times daily with a meal., Disp: , Rfl:  .  methylPREDNISolone (MEDROL DOSEPAK) 4 MG TBPK tablet, Take 4 mg by mouth., Disp: , Rfl:  .  metoprolol tartrate (LOPRESSOR) 25 MG tablet, Take 0.5 tablets (12.5 mg total) by mouth 2 (two) times  daily., Disp: 60 tablet, Rfl: 1 .  Multiple Vitamin (MULTIVITAMIN) tablet, Take 1 tablet by mouth daily., Disp: , Rfl:  .  pioglitazone (ACTOS) 45 MG tablet, Take 45 mg by mouth daily., Disp: , Rfl:  .  rOPINIRole (REQUIP) 2 MG tablet, ropinirole 2 mg tablet, Disp: , Rfl:  .  rosuvastatin (CRESTOR) 40 MG tablet, Take 1 tablet by mouth every evening., Disp: , Rfl:  .  sitaGLIPtin (JANUVIA) 100 MG tablet, Take 100 mg by mouth daily. , Disp: , Rfl:  .  traMADol (ULTRAM) 50 MG tablet, Take 1 tablet (50 mg total) by mouth every 6 (six) hours as needed., Disp: 10 tablet, Rfl: 0  Allergies Patient has no known allergies.  Review of Systems Review of Systems - Oncology ROS negative other than SOB.     Physical Exam  Vitals Wt Readings from Last 3 Encounters:  06/22/18 253 lb (114.8 kg)  06/20/18 258 lb (117 kg)  05/25/18 252 lb (114.3 kg)   Temp Readings from Last 3 Encounters:  06/22/18 98.3 F (36.8 C) (Oral)  05/25/18 97.6 F (36.4 C) (Oral)  04/24/18 98.4 F (36.9 C) (Oral)   BP Readings from Last 3 Encounters:  06/22/18 (!) 154/64  06/20/18 140/60  06/08/18 134/65   Pulse Readings from Last 3 Encounters:  06/22/18 88  06/20/18 79  06/08/18 66   Constitutional: Well-developed, well-nourished, and in  no distress.   HENT: Head: Normocephalic and atraumatic.  Mouth/Throat: No oropharyngeal exudate. Mucosa moist. Eyes: Pupils are equal, round, and reactive to light. Conjunctivae are normal. No scleral icterus.  Neck: Normal range of motion. Neck supple. No JVD present.  Cardiovascular: Normal rate, regular rhythm and normal heart sounds.  Exam reveals no gallop and no friction rub.   No murmur heard. Pulmonary/Chest: Pt on oxygen via [place].  Coarse breath sounds.   Abdominal: Soft. Bowel sounds are normal. No distension. There is no tenderness. There is no guarding.  Musculoskeletal: No edema or tenderness.  Lymphadenopathy: No cervical, axillary or supraclavicular adenopathy.  Neurological: Alert and oriented to person, place, and time. No cranial nerve deficit.  Skin: Skin is warm and dry. No rash noted. No erythema. No pallor.  Psychiatric: Affect and judgment normal.   Labs Appointment on 06/20/2018  Component Date Value Ref Range Status  . WBC 06/20/2018 10.9* 4.0 - 10.5 K/uL Final  . RBC 06/20/2018 3.36* 4.22 - 5.81 MIL/uL Final  . Hemoglobin 06/20/2018 9.6* 13.0 - 17.0 g/dL Final  . HCT 06/20/2018 32.3* 39.0 - 52.0 % Final  . MCV 06/20/2018 96.1  80.0 - 100.0 fL Final  . MCH 06/20/2018 28.6  26.0 - 34.0 pg Final  . MCHC 06/20/2018 29.7* 30.0 - 36.0 g/dL Final  . RDW 06/20/2018 15.7* 11.5 - 15.5 % Final  . Platelets 06/20/2018 503* 150 - 400 K/uL Final  . nRBC 06/20/2018 0.2  0.0 - 0.2 % Final  . Neutrophils Relative % 06/20/2018 72  % Final  . Neutro Abs 06/20/2018 7.9* 1.7 - 7.7 K/uL Final  . Lymphocytes Relative 06/20/2018 18  % Final  . Lymphs Abs 06/20/2018 2.0  0.7 - 4.0 K/uL Final  . Monocytes Relative 06/20/2018 8  % Final  . Monocytes Absolute 06/20/2018 0.8  0.1 - 1.0 K/uL Final  . Eosinophils Relative 06/20/2018 1  % Final  . Eosinophils Absolute 06/20/2018 0.1  0.0 - 0.5 K/uL Final  . Basophils Relative 06/20/2018 0  % Final  . Basophils Absolute 06/20/2018 0.0  0.0 - 0.1 K/uL Final  . Immature Granulocytes 06/20/2018 1  % Final  . Abs Immature Granulocytes 06/20/2018 0.05  0.00 - 0.07 K/uL Final   Performed at [facility], [address] [place], [place] [address]  . Sodium 06/20/2018 139  135 - 145 mmol/L Final  . Potassium 06/20/2018 4.8  3.5 - 5.1 mmol/L Final  . Chloride 06/20/2018 97* 98 - 111 mmol/L Final  . CO2 06/20/2018 31  22 - 32 mmol/L Final  . Glucose, Bld 06/20/2018 114* 70 - 99 mg/dL Final  . BUN 06/20/2018 12  8 - 23 mg/dL Final  . Creatinine, Ser 06/20/2018 1.23  0.61 - 1.24 mg/dL Final  . Calcium 06/20/2018 9.9  8.9 - 10.3 mg/dL Final  . Total Protein 06/20/2018 8.1  6.5 - 8.1 g/dL Final  . Albumin 06/20/2018 4.5  3.5 - 5.0 g/dL Final  . AST 06/20/2018 15  15 - 41 U/L Final  . ALT 06/20/2018 13  0 - 44 U/L Final  . Alkaline Phosphatase 06/20/2018 42  38 - 126 U/L Final  . Total Bilirubin 06/20/2018 0.6  0.3 - 1.2 mg/dL Final  . GFR calc non Af Amer 06/20/2018 60* >60 mL/min Final  . GFR calc Af Amer 06/20/2018 >60  >60 mL/min Final   Comment: (NOTE) The eGFR has been calculated using the CKD EPI equation. This calculation has not been validated in all clinical situations. eGFR's persistently <60 mL/min signify possible Chronic Kidney Disease.   Georgiann Hahn gap 06/20/2018 11  5 - 15 Final   Performed at [facility], [address], [place], [place] [address]  . LDH 06/20/2018 142  98 - 192 U/L Final   Performed at [facility], [address], [place], [place] [address]  . Ferritin 06/20/2018 147  24 - 336 ng/mL Final   Performed at [facility], [address], [place], [place] [address]  . Total Protein ELP 06/20/2018 7.3  6.0 - 8.5 g/dL Final  . Albumin ELP 06/20/2018 3.9  2.9 - 4.4 g/dL Final  . Alpha-1-Globulin 06/20/2018 0.3  0.0 - 0.4 g/dL Final  . Alpha-2-Globulin 06/20/2018 1.2* 0.4 - 1.0 g/dL Final  . Beta Globulin 06/20/2018 1.3  0.7 - 1.3 g/dL Final  . Gamma Globulin 06/20/2018 0.5  0.4 - 1.8 g/dL Final  .  M-Spike, % 06/20/2018 Not Observed  Not Observed g/dL Final  . SPE Interp. 06/20/2018 Comment   Final   Comment: (NOTE) The SPE pattern is suggestive of a subacute inflammatory response. This condition represents an intermediate stage between two possible courses for acute inflammation: total convalescense with a return to normal, or the onset of a chronic inflammatory condition. Performed At: [facility] [place], [place] [id number] Rush Farmer MD [id number]   . Comment 06/20/2018 Comment   Final   Comment: (NOTE) Protein electrophoresis scan will follow via computer, mail, or courier delivery.   Marland Kitchen GLOBULIN, TOTAL 06/20/2018 3.4  2.2 - 3.9 g/dL Corrected  . A/G Ratio 06/20/2018 1.1  0.7 - 1.7 Corrected     Pathology Orders Placed This Encounter  Procedures  . CBC with Differential/Platelet    Standing Status:   Future    Standing Expiration Date:   06/23/2019  . Comprehensive metabolic panel    Standing Status:   Future    Standing Expiration Date:   06/23/2019  . Lactate dehydrogenase    Standing Status:   Future    Standing Expiration Date:   06/23/2019  .  Protein electrophoresis, serum    Standing Status:   Future    Standing Expiration Date:   06/23/2019  . Ferritin    Standing Status:   Future    Standing Expiration Date:   06/23/2019  . Vitamin B12    Standing Status:   Future    Standing Expiration Date:   06/23/2019  . Methylmalonic acid, serum    Standing Status:   Future    Standing Expiration Date:   06/23/2019  . Folate    Standing Status:   Future    Standing Expiration Date:   06/23/2019       Zoila Shutter MD

## 2018-06-25 ENCOUNTER — Ambulatory Visit (HOSPITAL_COMMUNITY)
Admission: RE | Admit: 2018-06-25 | Discharge: 2018-06-25 | Disposition: A | Discharge status: Home / Self Care | Payer: 59 | Source: Ambulatory Visit | Attending: Cardiology | Admitting: Cardiology

## 2018-06-25 DIAGNOSIS — M79605 Pain in left leg: Secondary | ICD-10-CM | POA: Insufficient documentation

## 2018-06-25 DIAGNOSIS — M79604 Pain in right leg: Secondary | ICD-10-CM | POA: Insufficient documentation

## 2018-07-03 LAB — BASIC METABOLIC PANEL
BUN: 13 mg/dL (ref 7–25)
CO2: 30 mmol/L (ref 20–32)
Calcium: 10.7 mg/dL — ABNORMAL HIGH (ref 8.6–10.3)
Chloride: 96 mmol/L — ABNORMAL LOW (ref 98–110)
Creat: 1.05 mg/dL (ref 0.70–1.25)
GLUCOSE: 358 mg/dL — AB (ref 65–99)
Potassium: 4.6 mmol/L (ref 3.5–5.3)
SODIUM: 137 mmol/L (ref 135–146)

## 2018-07-03 LAB — MAGNESIUM: Magnesium: 1.5 mg/dL (ref 1.5–2.5)

## 2018-07-09 ENCOUNTER — Ambulatory Visit: Payer: 59 | Admitting: Cardiology

## 2018-07-13 ENCOUNTER — Emergency Department (HOSPITAL_COMMUNITY): Payer: 59

## 2018-07-13 ENCOUNTER — Encounter (HOSPITAL_COMMUNITY): Payer: Self-pay | Admitting: Emergency Medicine

## 2018-07-13 ENCOUNTER — Emergency Department (HOSPITAL_COMMUNITY)
Admission: EM | Admit: 2018-07-13 | Discharge: 2018-07-13 | Disposition: A | Discharge status: Home / Self Care | Payer: 59 | Source: Home / Self Care | Attending: Emergency Medicine | Admitting: Emergency Medicine

## 2018-07-13 ENCOUNTER — Other Ambulatory Visit: Payer: Self-pay

## 2018-07-13 DIAGNOSIS — M545 Low back pain, unspecified: Secondary | ICD-10-CM

## 2018-07-13 DIAGNOSIS — Z7982 Long term (current) use of aspirin: Secondary | ICD-10-CM | POA: Insufficient documentation

## 2018-07-13 DIAGNOSIS — J449 Chronic obstructive pulmonary disease, unspecified: Secondary | ICD-10-CM

## 2018-07-13 DIAGNOSIS — Z794 Long term (current) use of insulin: Secondary | ICD-10-CM

## 2018-07-13 DIAGNOSIS — I5033 Acute on chronic diastolic (congestive) heart failure: Secondary | ICD-10-CM | POA: Insufficient documentation

## 2018-07-13 DIAGNOSIS — Z79899 Other long term (current) drug therapy: Secondary | ICD-10-CM

## 2018-07-13 DIAGNOSIS — I110 Hypertensive heart disease with heart failure: Secondary | ICD-10-CM | POA: Insufficient documentation

## 2018-07-13 MED ORDER — HYDROMORPHONE HCL 1 MG/ML IJ SOLN
1.0000 mg | Freq: Once | INTRAMUSCULAR | Status: AC
  Administered 2018-07-13: 1 mg via INTRAMUSCULAR
  Filled 2018-07-13: qty 1

## 2018-07-13 MED ORDER — MELOXICAM 7.5 MG PO TABS: 15.0000 mg | ORAL_TABLET | Freq: Every day | ORAL | 0 refills | Status: DC | PRN

## 2018-07-13 MED ORDER — LORAZEPAM 1 MG PO TABS
1.0000 mg | ORAL_TABLET | Freq: Once | ORAL | Status: AC
  Administered 2018-07-13: 1 mg via ORAL
  Filled 2018-07-13: qty 1

## 2018-07-13 MED ORDER — OXYCODONE-ACETAMINOPHEN 5-325 MG PO TABS: 1.0000 | ORAL_TABLET | ORAL | 0 refills | Status: DC | PRN

## 2018-07-13 MED ORDER — KETOROLAC TROMETHAMINE 30 MG/ML IJ SOLN
15.0000 mg | Freq: Once | INTRAMUSCULAR | Status: AC
  Administered 2018-07-13: 15 mg via INTRAMUSCULAR
  Filled 2018-07-13: qty 1

## 2018-07-13 NOTE — ED Notes (Signed)
Pt returned from xray

## 2018-07-13 NOTE — ED Triage Notes (Signed)
Pt reports tripping over oxygen cord last night and falling on floor. C/o lower back pain and difficulty ambulating. Pt sats 78% on 2L with labored breathing after movement from wheelchair to bed. Pt wears 3-4 L at home, but had it set on 2L due to small tank. Pt reports bilateral leg weakness and difficulty ambulating.

## 2018-07-13 NOTE — ED Notes (Signed)
Sats increased to 98% on 4L.

## 2018-07-15 NOTE — ED Provider Notes (Signed)
[facility] EMERGENCY DEPARTMENT Provider Note   CSN: [id number] Arrival date & time: 07/13/18  4098     History   Chief Complaint Chief Complaint  Patient presents with  . Fall    HPI Brett [name] is a 67 y.o. male.  HPI   67 year old male with lower back pain.  History of lower back pain but exacerbated after fall last night.  He tripped over his oxygen tubing and lost his balance.  He fell backwards onto his bottom back.  He said increased pain since that time.  Tolerable at rest.  Sniffily worse in sitting up or trying to ambulate.  He is having a hard time walking because of the pain but does not really feel like his legs are weak.  No acute numbness or tingling.  Past Medical History:  Diagnosis Date  . Depression   . Dextrocardia   . Diabetes mellitus   . H. pylori infection 11/09/2012   treated with pylera  . HTN (hypertension)    Cholesterol  . Iron deficiency anemia, unspecified 10/18/2012  . Neuropathy   . Pneumonia   . Tachycardia     Patient Active Problem List   Diagnosis Date Noted  . Acute on chronic respiratory failure with hypoxia ([place]) 04/21/2018  . COPD with acute exacerbation ([place]) 04/21/2018  . SVT (supraventricular tachycardia) ([place]) 04/14/2018  . Paroxysmal SVT (supraventricular tachycardia) ([place]) 04/14/2018  . Acute on chronic diastolic CHF (congestive heart failure) ([place]) 04/14/2018  . CHF exacerbation ([place]) 04/10/2018  . 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 Restrictive lung disease 11/08/2016  . 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 Moderate persistent asthma, uncomplicated 11/91/4782  . Thrombocytosis ([place]) 10/09/2014  . Abdominal pain 09/23/2014  . Gastritis, Helicobacter pylori 95/62/1308  . Insulin-requiring or dependent type II diabetes mellitus ([place]) 01/03/2014  . 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 GERD (gastroesophageal reflux disease) 10/18/2012  . Nausea 10/18/2012  . Iron deficiency anemia due to chronic blood loss  10/18/2012  . HIP PAIN 08/31/2010  . SPINAL STENOSIS 06/28/2010  . TENDINITIS, CALCIFIC, SHOULDER, RIGHT 06/14/2010  . IMPINGEMENT SYNDROME 06/14/2010    Past Surgical History:  Procedure Laterality Date  . CATARACT EXTRACTION, BILATERAL  2016  . COLONOSCOPY WITH ESOPHAGOGASTRODUODENOSCOPY (EGD) N/A 11/09/2012   Dr. 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 hpylori on bx- treated with pylera. TCS- normal rectum. 1 diminutive polyp in the mid descending segment. 1-18mm polyp in the mid desending segment o/w the remainder of the colonic mucosa appeared normal. tubular adenoma on bx  . GIVENS CAPSULE STUDY N/A 10/07/2013   no source for anemia or heme positive stool noted  . NECK SURGERY     bone spurs        Home Medications    Prior to Admission medications   Medication Sig Start Date End Date Taking? Authorizing Provider  albuterol (PROAIR HFA) 108 (90 Base) MCG/ACT inhaler Inhale 2 puffs into the lungs every 4 (four) hours as needed for wheezing or shortness of breath. 09/27/16  Yes Valentina Shaggy, MD  aspirin EC 81 MG tablet Take 81 mg by mouth daily.   Yes [provider]  budesonide-formoterol (SYMBICORT) 160-4.5 MCG/ACT inhaler Inhale 2 puffs into the lungs 2 (two) times daily. 09/27/16  Yes Valentina Shaggy, MD  DEXILANT 60 MG capsule TAKE (1) CAPSULE BY MOUTH EVERY DAY. 08/26/15  Yes [name], PA-C  doxycycline (VIBRAMYCIN) 100 MG capsule Take 100 mg  by mouth 2 (two) times daily.   Yes [provider]  DULoxetine (CYMBALTA) 60 MG capsule Take 60 mg by mouth daily.   Yes [provider]  fenofibrate (TRICOR) 145 MG tablet  05/28/18  Yes [provider]  folic acid (FOLVITE) 1 MG tablet Take 1 mg by mouth daily.   Yes [provider]  furosemide (LASIX) 40 MG tablet Take 60 mg in the morning, take 40 mg in the evening. 06/20/18  Yes  Branch, Alphonse Guild, MD  gabapentin (NEURONTIN) 600 MG tablet Take 1 tablet (600 mg total) by mouth 2 (two) times daily. Patient taking differently: Take 600 mg by mouth 3 (three) times daily.  02/10/18  Yes Triplett, Tammy, PA-C  insulin detemir (LEVEMIR) 100 UNIT/ML injection Inject 15-60 Units into the skin 2 (two) times daily. 15 units in the am and 60 units at qhs   Yes [provider]  losartan (COZAAR) 100 MG tablet Take 100 mg by mouth daily.   Yes [provider]  Magnesium 400 MG TABS Take by mouth.   Yes [provider]  metFORMIN (GLUCOPHAGE) 1000 MG tablet Take 1,000 mg by mouth 2 (two) times daily with a meal.   Yes [provider]  metoprolol tartrate (LOPRESSOR) 25 MG tablet Take 0.5 tablets (12.5 mg total) by mouth 2 (two) times daily. 04/15/18  Yes Tat, Shanon Brow, MD  Multiple Vitamin (MULTIVITAMIN) tablet Take 1 tablet by mouth daily.   Yes [provider]  pioglitazone (ACTOS) 45 MG tablet Take 45 mg by mouth daily.   Yes [provider]  rOPINIRole (REQUIP) 2 MG tablet ropinirole 2 mg tablet   Yes [provider]  rosuvastatin (CRESTOR) 40 MG tablet Take 1 tablet by mouth every evening. 03/22/18  Yes [provider]  sitaGLIPtin (JANUVIA) 100 MG tablet Take 100 mg by mouth daily.    Yes [provider]  traMADol (ULTRAM) 50 MG tablet Take 1 tablet (50 mg total) by mouth every 6 (six) hours as needed. 02/10/18  Yes Triplett, Tammy, PA-C  traZODone (DESYREL) 50 MG tablet Take 1 tablet by mouth at bedtime as needed for sleep. 07/03/18  Yes [provider]  meloxicam (MOBIC) 7.5 MG tablet Take 2 tablets (15 mg total) by mouth daily as needed for pain. 07/13/18   Virgel Manifold, MD  methylPREDNISolone (MEDROL DOSEPAK) 4 MG TBPK tablet Take 4 mg by mouth.    [provider]  oxyCODONE-acetaminophen (PERCOCET/ROXICET) 5-325 MG tablet Take 1 tablet by mouth every 4 (four) hours as needed for severe  pain. 07/13/18   Virgel Manifold, MD    Family History Family History  Problem Relation Age of Onset  . Diabetes Sister        Fx  . Heart defect Sister        Fx  . Arthritis Sister        Fx  . Asthma Sister        Fx  . Kidney disease Sister        Fx  . Pancreatitis Sister     Social History Social History   Tobacco Use  . Smoking status: Never Smoker  . Smokeless tobacco: Current User    Types: Chew  Substance Use Topics  . Alcohol use: No  . Drug use: No     Allergies   Patient has no known allergies.   Review of Systems Review of Systems  All systems reviewed and negative, other than as noted  in HPI.  Physical Exam Updated Vital Signs BP 135/64 (BP Location: Left Arm)   Pulse 74   Temp (!) 97.5 F (36.4 C) (Oral)   Resp 20   Wt 117.9 kg   SpO2 98%   BMI 40.72 kg/m   Physical Exam  Constitutional: He appears well-developed and well-nourished. No distress.  HENT:  Head: Normocephalic and atraumatic.  Eyes: Conjunctivae are normal. Right eye exhibits no discharge. Left eye exhibits no discharge.  Neck: Neck supple.  Cardiovascular: Normal rate, regular rhythm and normal heart sounds. Exam reveals no gallop and no friction rub.  No murmur heard. Pulmonary/Chest: Effort normal and breath sounds normal. No respiratory distress.  Abdominal: Soft. He exhibits no distension. There is no tenderness.  Musculoskeletal: He exhibits no edema or tenderness.  Tenderness to palpation over the left lumbar back.  No overlying skin changes.  No midline spinal tenderness.  Neurological: He is alert.  Strength is 5 out of 5 bilateral lower extremities.  Sensation intact to light touch.  Skin: Skin is warm and dry.  Psychiatric: He has a normal mood and affect. His behavior is normal. Thought content normal.  Nursing note and vitals reviewed.    ED Treatments / Results  Labs (all labs ordered are listed, but only abnormal results are displayed) Labs Reviewed  - No data to display  EKG None  Radiology No results found.   Dg Lumbar Spine Complete  Result Date: 07/13/2018 CLINICAL DATA:  Fall. EXAM: LUMBAR SPINE - COMPLETE 4+ VIEW COMPARISON:  Lumbar spine 02/10/2018. FINDINGS: Lumbar scoliosis concave left. Diffuse degenerative change. No acute bony abnormality identified. No evidence of fracture. IMPRESSION: Lumbar spine scoliosis concave left with diffuse multilevel degenerative change. No acute bony abnormality identified. Exam. Stable from prior exam. Electronically Signed   By: Marcello Moores  Register   On: 07/13/2018 11:10   Mr Lumbar Spine Wo Contrast  Result Date: 06/19/2018 CLINICAL DATA:  Chronic low back and leg pain.  No injury. EXAM: MRI LUMBAR SPINE WITHOUT CONTRAST TECHNIQUE: Multiplanar, multisequence MR imaging of the lumbar spine was performed. No intravenous contrast was administered. COMPARISON:  09/01/2010. FINDINGS: Segmentation:  Standard. Alignment:  Physiologic. Vertebrae:  No fracture, evidence of discitis, or bone lesion. Conus medullaris and cauda equina: Conus extends to the L1-L2 level. Conus and cauda equina appear normal. Paraspinal and other soft tissues: Increased body habitus. No mass or hydronephrosis. Disc levels: L1-L2: Central protrusion. Slight caudal migration. No impingement. L2-L3:  Normal disc space.  Mild facet arthropathy.  No impingement. L3-L4:  Normal disc space.  Mild facet arthropathy.  No impingement. L4-L5:  Normal disc space.  Mild facet arthropathy.  No impingement. L5-S1: Disc desiccation. Annular bulge. No protrusion. Mild facet arthropathy. Trefoil appearance to the thecal sac at the L5-S1 level and below, prominent ventral epidural space, seen on midline sagittal images with abundant epidural fat in the spinal canal of the sacrum. In the appropriate setting, imaging findings are consistent with epidural lipomatosis. Compared with 2011, the disc protrusion at L5-S1 has partially regressed. IMPRESSION: Disc  protrusion at L5-S1 has regressed, now with annular bulge. Trefoil appearance to the thecal sac at L5-S1 and below, consistent with epidural lipomatosis. Correlate clinically. Electronically Signed   By: Staci Righter [name]   On: 06/19/2018 09:42   US Arterial Abi (screening Lower Extremity)  Result Date: 06/25/2018 CLINICAL DATA:  Bilateral lower extremity claudication EXAM: NONINVASIVE PHYSIOLOGIC VASCULAR STUDY OF BILATERAL LOWER EXTREMITIES TECHNIQUE: Evaluation of both lower extremities were performed at  rest, including calculation of ankle-brachial indices with single level Doppler, pressure and pulse volume recording. COMPARISON:  None. FINDINGS: Right ABI:  1.24 Left ABI:  1.07 Right Lower Extremity:  Normal arterial waveforms at the ankle. Left Lower Extremity:  Normal arterial waveforms at the ankle. IMPRESSION: No significant arterial occlusive disease in the lower extremities. Electronically Signed   By: Marybelle Killings [name]   On: 06/25/2018 13:58    Procedures Procedures (including critical care time)  Medications Ordered in ED Medications  HYDROmorphone (DILAUDID) injection 1 mg (1 mg Intramuscular Given 07/13/18 1033)  ketorolac (TORADOL) 30 MG/ML injection 15 mg (15 mg Intramuscular Given 07/13/18 1033)  LORazepam (ATIVAN) tablet 1 mg (1 mg Oral Given 07/13/18 1033)     Initial Impression / Assessment and Plan / ED Course  I have reviewed the triage vital signs and the nursing notes.  Pertinent labs & imaging results that were available during my care of the patient were reviewed by me and considered in my medical decision making (see chart for details).     67 year old male with increased lower back pain after mechanical fall.  No "red flags."  Symptomatic treatment.  Final Clinical Impressions(s) / ED Diagnoses   Final diagnoses:  Acute low back pain without sciatica, unspecified back pain laterality    ED Discharge Orders         Ordered    meloxicam (MOBIC) 7.5 MG  tablet  Daily PRN     07/13/18 1136    oxyCODONE-acetaminophen (PERCOCET/ROXICET) 5-325 MG tablet  Every 4 hours PRN     07/13/18 1136           Virgel Manifold, MD 07/15/18 2347

## 2018-07-16 ENCOUNTER — Inpatient Hospital Stay (HOSPITAL_COMMUNITY)
Admission: EM | Admit: 2018-07-16 | Discharge: 2018-07-19 | DRG: 392 | Disposition: A | Discharge status: Home / Self Care | Payer: 59 | Attending: Internal Medicine | Admitting: Internal Medicine

## 2018-07-16 ENCOUNTER — Encounter (HOSPITAL_COMMUNITY): Payer: Self-pay | Admitting: Emergency Medicine

## 2018-07-16 ENCOUNTER — Emergency Department (HOSPITAL_COMMUNITY): Payer: 59

## 2018-07-16 ENCOUNTER — Inpatient Hospital Stay (HOSPITAL_COMMUNITY): Payer: 59

## 2018-07-16 ENCOUNTER — Other Ambulatory Visit: Payer: Self-pay

## 2018-07-16 DIAGNOSIS — D649 Anemia, unspecified: Secondary | ICD-10-CM

## 2018-07-16 DIAGNOSIS — K5900 Constipation, unspecified: Secondary | ICD-10-CM

## 2018-07-16 DIAGNOSIS — Z9981 Dependence on supplemental oxygen: Secondary | ICD-10-CM

## 2018-07-16 DIAGNOSIS — Z794 Long term (current) use of insulin: Secondary | ICD-10-CM | POA: Diagnosis not present

## 2018-07-16 DIAGNOSIS — Z6841 Body Mass Index (BMI) 40.0 and over, adult: Secondary | ICD-10-CM

## 2018-07-16 DIAGNOSIS — Z79899 Other long term (current) drug therapy: Secondary | ICD-10-CM | POA: Diagnosis not present

## 2018-07-16 DIAGNOSIS — K810 Acute cholecystitis: Secondary | ICD-10-CM

## 2018-07-16 DIAGNOSIS — J189 Pneumonia, unspecified organism: Secondary | ICD-10-CM

## 2018-07-16 DIAGNOSIS — I110 Hypertensive heart disease with heart failure: Secondary | ICD-10-CM | POA: Diagnosis present

## 2018-07-16 DIAGNOSIS — Z7982 Long term (current) use of aspirin: Secondary | ICD-10-CM

## 2018-07-16 DIAGNOSIS — J449 Chronic obstructive pulmonary disease, unspecified: Secondary | ICD-10-CM

## 2018-07-16 DIAGNOSIS — J9811 Atelectasis: Secondary | ICD-10-CM | POA: Diagnosis present

## 2018-07-16 DIAGNOSIS — E119 Type 2 diabetes mellitus without complications: Secondary | ICD-10-CM

## 2018-07-16 DIAGNOSIS — E1142 Type 2 diabetes mellitus with diabetic polyneuropathy: Secondary | ICD-10-CM | POA: Diagnosis present

## 2018-07-16 DIAGNOSIS — Z791 Long term (current) use of non-steroidal anti-inflammatories (NSAID): Secondary | ICD-10-CM | POA: Diagnosis not present

## 2018-07-16 DIAGNOSIS — R1012 Left upper quadrant pain: Secondary | ICD-10-CM | POA: Diagnosis not present

## 2018-07-16 DIAGNOSIS — Z7951 Long term (current) use of inhaled steroids: Secondary | ICD-10-CM | POA: Diagnosis not present

## 2018-07-16 DIAGNOSIS — J9611 Chronic respiratory failure with hypoxia: Secondary | ICD-10-CM | POA: Diagnosis present

## 2018-07-16 DIAGNOSIS — R740 Nonspecific elevation of levels of transaminase and lactic acid dehydrogenase [LDH]: Secondary | ICD-10-CM

## 2018-07-16 DIAGNOSIS — G8929 Other chronic pain: Secondary | ICD-10-CM | POA: Diagnosis present

## 2018-07-16 DIAGNOSIS — M545 Low back pain, unspecified: Secondary | ICD-10-CM

## 2018-07-16 DIAGNOSIS — I2510 Atherosclerotic heart disease of native coronary artery without angina pectoris: Secondary | ICD-10-CM | POA: Diagnosis present

## 2018-07-16 DIAGNOSIS — R7401 Elevation of levels of liver transaminase levels: Secondary | ICD-10-CM

## 2018-07-16 DIAGNOSIS — J441 Chronic obstructive pulmonary disease with (acute) exacerbation: Secondary | ICD-10-CM | POA: Diagnosis present

## 2018-07-16 DIAGNOSIS — Z720 Tobacco use: Secondary | ICD-10-CM | POA: Diagnosis not present

## 2018-07-16 DIAGNOSIS — K2980 Duodenitis without bleeding: Secondary | ICD-10-CM

## 2018-07-16 DIAGNOSIS — I5032 Chronic diastolic (congestive) heart failure: Secondary | ICD-10-CM | POA: Diagnosis present

## 2018-07-16 DIAGNOSIS — I10 Essential (primary) hypertension: Secondary | ICD-10-CM | POA: Diagnosis not present

## 2018-07-16 DIAGNOSIS — D500 Iron deficiency anemia secondary to blood loss (chronic): Secondary | ICD-10-CM | POA: Diagnosis present

## 2018-07-16 DIAGNOSIS — Q893 Situs inversus: Secondary | ICD-10-CM

## 2018-07-16 HISTORY — DX: Situs inversus: Q89.3

## 2018-07-16 LAB — URINALYSIS, ROUTINE W REFLEX MICROSCOPIC
Bilirubin Urine: NEGATIVE
Glucose, UA: NEGATIVE mg/dL
Hgb urine dipstick: NEGATIVE
KETONES UR: NEGATIVE mg/dL
Leukocytes, UA: NEGATIVE
NITRITE: NEGATIVE
PH: 6 (ref 5.0–8.0)
Protein, ur: NEGATIVE mg/dL
SPECIFIC GRAVITY, URINE: 1.01 (ref 1.005–1.030)

## 2018-07-16 LAB — COMPREHENSIVE METABOLIC PANEL
ALK PHOS: 55 U/L (ref 38–126)
ALT: 198 U/L — AB (ref 0–44)
AST: 295 U/L — ABNORMAL HIGH (ref 15–41)
Albumin: 3.7 g/dL (ref 3.5–5.0)
Anion gap: 10 (ref 5–15)
BUN: 19 mg/dL (ref 8–23)
CALCIUM: 8.8 mg/dL — AB (ref 8.9–10.3)
CHLORIDE: 91 mmol/L — AB (ref 98–111)
CO2: 30 mmol/L (ref 22–32)
CREATININE: 1.1 mg/dL (ref 0.61–1.24)
Glucose, Bld: 166 mg/dL — ABNORMAL HIGH (ref 70–99)
Potassium: 5.1 mmol/L (ref 3.5–5.1)
Sodium: 131 mmol/L — ABNORMAL LOW (ref 135–145)
TOTAL PROTEIN: 7 g/dL (ref 6.5–8.1)
Total Bilirubin: 0.6 mg/dL (ref 0.3–1.2)

## 2018-07-16 LAB — CBC WITH DIFFERENTIAL/PLATELET
ABS IMMATURE GRANULOCYTES: 0.05 10*3/uL (ref 0.00–0.07)
Basophils Absolute: 0 10*3/uL (ref 0.0–0.1)
Basophils Relative: 0 %
EOS PCT: 0 %
Eosinophils Absolute: 0 10*3/uL (ref 0.0–0.5)
HEMATOCRIT: 27 % — AB (ref 39.0–52.0)
HEMOGLOBIN: 8.1 g/dL — AB (ref 13.0–17.0)
Immature Granulocytes: 0 %
LYMPHS ABS: 0.9 10*3/uL (ref 0.7–4.0)
Lymphocytes Relative: 7 %
MCH: 29.1 pg (ref 26.0–34.0)
MCHC: 30 g/dL (ref 30.0–36.0)
MCV: 97.1 fL (ref 80.0–100.0)
MONO ABS: 0.9 10*3/uL (ref 0.1–1.0)
MONOS PCT: 7 %
Neutro Abs: 11.2 10*3/uL — ABNORMAL HIGH (ref 1.7–7.7)
Neutrophils Relative %: 86 %
Platelets: 408 10*3/uL — ABNORMAL HIGH (ref 150–400)
RBC: 2.78 MIL/uL — ABNORMAL LOW (ref 4.22–5.81)
RDW: 14.6 % (ref 11.5–15.5)
WBC: 13.1 10*3/uL — ABNORMAL HIGH (ref 4.0–10.5)
nRBC: 0.2 % (ref 0.0–0.2)

## 2018-07-16 LAB — BRAIN NATRIURETIC PEPTIDE: B Natriuretic Peptide: 393 pg/mL — ABNORMAL HIGH (ref 0.0–100.0)

## 2018-07-16 LAB — MAGNESIUM: Magnesium: 1.9 mg/dL (ref 1.7–2.4)

## 2018-07-16 LAB — MRSA PCR SCREENING: MRSA BY PCR: NEGATIVE

## 2018-07-16 LAB — LIPASE, BLOOD: LIPASE: 22 U/L (ref 11–51)

## 2018-07-16 LAB — TROPONIN I
Troponin I: 0.14 ng/mL (ref <0.03)
Troponin I: 0.17 ng/mL (ref <0.03)

## 2018-07-16 LAB — PROTIME-INR
INR: 1.23
Prothrombin Time: 15.4 seconds — ABNORMAL HIGH (ref 11.4–15.2)

## 2018-07-16 LAB — CBG MONITORING, ED: Glucose-Capillary: 148 mg/dL — ABNORMAL HIGH (ref 70–99)

## 2018-07-16 MED ORDER — TRAZODONE HCL 50 MG PO TABS
50.0000 mg | ORAL_TABLET | Freq: Every day | ORAL | Status: DC
  Administered 2018-07-16 – 2018-07-18 (×3): 50 mg via ORAL
  Filled 2018-07-16 (×3): qty 1

## 2018-07-16 MED ORDER — ACETAMINOPHEN 650 MG RE SUPP: 650.0000 mg | Freq: Four times a day (QID) | RECTAL | Status: DC | PRN

## 2018-07-16 MED ORDER — ALBUTEROL SULFATE (2.5 MG/3ML) 0.083% IN NEBU: 3.0000 mL | INHALATION_SOLUTION | RESPIRATORY_TRACT | Status: DC | PRN

## 2018-07-16 MED ORDER — FERROUS SULFATE 325 (65 FE) MG PO TABS
325.0000 mg | ORAL_TABLET | Freq: Two times a day (BID) | ORAL | Status: DC
  Administered 2018-07-17 – 2018-07-19 (×4): 325 mg via ORAL
  Filled 2018-07-16 (×5): qty 1

## 2018-07-16 MED ORDER — ENOXAPARIN SODIUM 40 MG/0.4ML ~~LOC~~ SOLN: 40.0000 mg | SUBCUTANEOUS | Status: DC

## 2018-07-16 MED ORDER — FAMOTIDINE IN NACL 20-0.9 MG/50ML-% IV SOLN
20.0000 mg | Freq: Once | INTRAVENOUS | Status: AC
  Administered 2018-07-16: 20 mg via INTRAVENOUS
  Filled 2018-07-16: qty 50

## 2018-07-16 MED ORDER — METRONIDAZOLE IN NACL 5-0.79 MG/ML-% IV SOLN
500.0000 mg | Freq: Three times a day (TID) | INTRAVENOUS | Status: DC
  Administered 2018-07-16 – 2018-07-19 (×8): 500 mg via INTRAVENOUS
  Filled 2018-07-16 (×9): qty 100

## 2018-07-16 MED ORDER — PANTOPRAZOLE SODIUM 40 MG PO TBEC
40.0000 mg | DELAYED_RELEASE_TABLET | Freq: Every day | ORAL | Status: DC
  Administered 2018-07-17 – 2018-07-19 (×2): 40 mg via ORAL
  Filled 2018-07-16 (×2): qty 1

## 2018-07-16 MED ORDER — ASPIRIN EC 81 MG PO TBEC
81.0000 mg | DELAYED_RELEASE_TABLET | Freq: Every day | ORAL | Status: DC
  Administered 2018-07-17 – 2018-07-19 (×2): 81 mg via ORAL
  Filled 2018-07-16 (×2): qty 1

## 2018-07-16 MED ORDER — FENTANYL CITRATE (PF) 100 MCG/2ML IJ SOLN
50.0000 ug | INTRAMUSCULAR | Status: AC | PRN
  Administered 2018-07-16 (×2): 50 ug via INTRAVENOUS
  Filled 2018-07-16 (×4): qty 2

## 2018-07-16 MED ORDER — MAGNESIUM OXIDE 400 (241.3 MG) MG PO TABS
400.0000 mg | ORAL_TABLET | Freq: Every day | ORAL | Status: DC
  Administered 2018-07-17 – 2018-07-19 (×2): 400 mg via ORAL
  Filled 2018-07-16 (×2): qty 1

## 2018-07-16 MED ORDER — MELOXICAM 15 MG PO TABS
15.0000 mg | ORAL_TABLET | Freq: Every day | ORAL | Status: DC | PRN
  Filled 2018-07-16: qty 1

## 2018-07-16 MED ORDER — VANCOMYCIN HCL 10 G IV SOLR
2000.0000 mg | Freq: Once | INTRAVENOUS | Status: DC
  Filled 2018-07-16: qty 2000

## 2018-07-16 MED ORDER — SODIUM CHLORIDE 0.9 % IV SOLN
1.0000 g | Freq: Three times a day (TID) | INTRAVENOUS | Status: DC
  Administered 2018-07-16: 1 g via INTRAVENOUS
  Filled 2018-07-16: qty 1

## 2018-07-16 MED ORDER — SODIUM CHLORIDE 0.9 % IV SOLN
INTRAVENOUS | Status: DC
  Administered 2018-07-16: 1 mL via INTRAVENOUS

## 2018-07-16 MED ORDER — INSULIN ASPART 100 UNIT/ML ~~LOC~~ SOLN
0.0000 [IU] | SUBCUTANEOUS | Status: DC
  Administered 2018-07-16: 2 [IU] via SUBCUTANEOUS

## 2018-07-16 MED ORDER — GABAPENTIN 300 MG PO CAPS
600.0000 mg | ORAL_CAPSULE | Freq: Three times a day (TID) | ORAL | Status: DC
  Administered 2018-07-16 – 2018-07-19 (×7): 600 mg via ORAL
  Filled 2018-07-16 (×7): qty 2

## 2018-07-16 MED ORDER — SODIUM CHLORIDE 0.9 % IV SOLN
2.0000 g | Freq: Three times a day (TID) | INTRAVENOUS | Status: DC
  Administered 2018-07-17 – 2018-07-19 (×6): 2 g via INTRAVENOUS
  Filled 2018-07-16 (×7): qty 2

## 2018-07-16 MED ORDER — FENOFIBRATE 160 MG PO TABS
160.0000 mg | ORAL_TABLET | Freq: Every day | ORAL | Status: DC
  Administered 2018-07-16 – 2018-07-19 (×3): 160 mg via ORAL
  Filled 2018-07-16 (×5): qty 1

## 2018-07-16 MED ORDER — ACETAMINOPHEN 325 MG PO TABS: 650.0000 mg | ORAL_TABLET | Freq: Four times a day (QID) | ORAL | Status: DC | PRN

## 2018-07-16 MED ORDER — ADULT MULTIVITAMIN W/MINERALS CH
1.0000 | ORAL_TABLET | Freq: Every morning | ORAL | Status: DC
  Administered 2018-07-17 – 2018-07-19 (×2): 1 via ORAL
  Filled 2018-07-16 (×2): qty 1

## 2018-07-16 MED ORDER — DULOXETINE HCL 60 MG PO CPEP
60.0000 mg | ORAL_CAPSULE | Freq: Every day | ORAL | Status: DC
  Administered 2018-07-17 – 2018-07-19 (×2): 60 mg via ORAL
  Filled 2018-07-16: qty 2
  Filled 2018-07-16: qty 1

## 2018-07-16 MED ORDER — LOSARTAN POTASSIUM 50 MG PO TABS
100.0000 mg | ORAL_TABLET | Freq: Every day | ORAL | Status: DC
  Administered 2018-07-17 – 2018-07-19 (×2): 100 mg via ORAL
  Filled 2018-07-16: qty 4
  Filled 2018-07-16: qty 2

## 2018-07-16 MED ORDER — ONDANSETRON HCL 4 MG/2ML IJ SOLN: 4.0000 mg | Freq: Four times a day (QID) | INTRAMUSCULAR | Status: DC | PRN

## 2018-07-16 MED ORDER — ONDANSETRON HCL 4 MG PO TABS: 4.0000 mg | ORAL_TABLET | Freq: Four times a day (QID) | ORAL | Status: DC | PRN

## 2018-07-16 MED ORDER — INSULIN DETEMIR 100 UNIT/ML ~~LOC~~ SOLN
30.0000 [IU] | Freq: Two times a day (BID) | SUBCUTANEOUS | Status: DC
  Administered 2018-07-16: 30 [IU] via SUBCUTANEOUS
  Filled 2018-07-16 (×2): qty 0.3

## 2018-07-16 MED ORDER — FOLIC ACID 1 MG PO TABS
1.0000 mg | ORAL_TABLET | Freq: Every day | ORAL | Status: DC
  Administered 2018-07-17 – 2018-07-19 (×2): 1 mg via ORAL
  Filled 2018-07-16 (×2): qty 1

## 2018-07-16 MED ORDER — ROSUVASTATIN CALCIUM 20 MG PO TABS
40.0000 mg | ORAL_TABLET | Freq: Every evening | ORAL | Status: DC
  Administered 2018-07-16: 40 mg via ORAL
  Filled 2018-07-16 (×2): qty 1

## 2018-07-16 MED ORDER — FENTANYL CITRATE (PF) 100 MCG/2ML IJ SOLN
50.0000 ug | INTRAMUSCULAR | Status: AC | PRN
  Administered 2018-07-16 (×2): 50 ug via INTRAVENOUS

## 2018-07-16 MED ORDER — INSULIN ASPART 100 UNIT/ML ~~LOC~~ SOLN
SUBCUTANEOUS | Status: AC
  Administered 2018-07-16: 2 [IU] via SUBCUTANEOUS
  Filled 2018-07-16: qty 1

## 2018-07-16 MED ORDER — ROPINIROLE HCL 1 MG PO TABS
2.0000 mg | ORAL_TABLET | Freq: Every day | ORAL | Status: DC
  Administered 2018-07-16 – 2018-07-18 (×3): 2 mg via ORAL
  Filled 2018-07-16 (×5): qty 2

## 2018-07-16 MED ORDER — SODIUM CHLORIDE 0.9 % IV SOLN
INTRAVENOUS | Status: DC
  Administered 2018-07-16: 21:00:00 via INTRAVENOUS

## 2018-07-16 MED ORDER — VANCOMYCIN HCL IN DEXTROSE 1-5 GM/200ML-% IV SOLN: 1000.0000 mg | Freq: Two times a day (BID) | INTRAVENOUS | Status: DC

## 2018-07-16 MED ORDER — MOMETASONE FURO-FORMOTEROL FUM 200-5 MCG/ACT IN AERO
2.0000 | INHALATION_SPRAY | Freq: Two times a day (BID) | RESPIRATORY_TRACT | Status: DC
  Administered 2018-07-17 – 2018-07-19 (×3): 2 via RESPIRATORY_TRACT
  Filled 2018-07-16 (×3): qty 8.8

## 2018-07-16 MED ORDER — METOPROLOL TARTRATE 12.5 MG HALF TABLET
12.5000 mg | ORAL_TABLET | Freq: Two times a day (BID) | ORAL | Status: DC
  Administered 2018-07-16 – 2018-07-19 (×5): 12.5 mg via ORAL
  Filled 2018-07-16 (×5): qty 1

## 2018-07-16 MED ORDER — MORPHINE SULFATE (PF) 2 MG/ML IV SOLN
2.0000 mg | INTRAVENOUS | Status: DC | PRN
  Administered 2018-07-17: 2 mg via INTRAVENOUS
  Administered 2018-07-17: 4 mg via INTRAVENOUS
  Administered 2018-07-17 – 2018-07-18 (×3): 2 mg via INTRAVENOUS
  Filled 2018-07-16: qty 1
  Filled 2018-07-16: qty 2
  Filled 2018-07-16 (×3): qty 1

## 2018-07-16 NOTE — ED Notes (Signed)
Date and time results received: 07/16/18 7:48 PM  (use smartphrase ".now" to insert current time)  Test: troponin Critical Value: 0.14  Name of Provider Notified: MD Thurnell Garbe  Orders Received? Or Actions Taken?: DR Notified

## 2018-07-16 NOTE — ED Notes (Signed)
Return from XRAY.

## 2018-07-16 NOTE — H&P (Signed)
History and Physical    Brett [name] KZS:[id number] DOB: Jul 21, 1951 DOA: 07/16/2018  PCP: [name], MD  Patient coming from: Home  I have personally briefly reviewed patient's old medical records in [place]  Chief Complaint: Fall, abd and back pain  HPI: Brett [name] is a 67 y.o. 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  Patient presents to the ED with c/o low back pain following a fall at home.  He tripped over his oxygen tubing and lost his balance.  He fell backwards onto his bottom back.  He said increased pain since that time.  Tolerable at rest.  Sniffily worse in sitting up or trying to ambulate.  He is having a hard time walking because of the pain but does not really feel like his legs are weak.  No acute numbness or tingling.  Fall occurred on halloween night.  Additionally has LUQ pain, radiates to neck.   ED Course: AST 295 ALT 198, trop 0.14 (looks like baseline 0.2-0.3)  ? Atelectasis vs infiltrate RLL, CT reveals cholecystitis.  HGB 8.1.   Review of Systems: As per HPI otherwise 10 point review of systems negative.   Past Medical History:  Diagnosis Date  . Depression   . Dextrocardia   . Diabetes mellitus   . H. pylori infection 11/09/2012   treated with pylera  . HTN (hypertension)    Cholesterol  . Iron deficiency anemia, unspecified 10/18/2012  . Neuropathy   . Pneumonia   . Situs inversus totalis   . Tachycardia     Past Surgical History:  Procedure Laterality Date  . CATARACT EXTRACTION, BILATERAL  2016  . COLONOSCOPY WITH ESOPHAGOGASTRODUODENOSCOPY (EGD) N/A 11/09/2012   Dr. 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 hpylori on bx- treated with pylera. TCS- normal rectum. 1 diminutive polyp in the mid descending segment. 1-9mm polyp in the mid desending segment o/w the remainder  of the colonic mucosa appeared normal. tubular adenoma on bx  . GIVENS CAPSULE STUDY N/A 10/07/2013   no source for anemia or heme positive stool noted  . NECK SURGERY     bone spurs     reports that he has never smoked. His smokeless tobacco use includes chew. He reports that he does not drink alcohol or use drugs.  No Known Allergies  Family History  Problem Relation Age of Onset  . Diabetes Sister        Fx  . Heart defect Sister        Fx  . Arthritis Sister        Fx  . Asthma Sister        Fx  . Kidney disease Sister        Fx  . Pancreatitis Sister      Prior to Admission medications   Medication Sig Start Date End Date Taking? Authorizing Provider  albuterol (PROAIR HFA) 108 (90 Base) MCG/ACT inhaler Inhale 2 puffs into the lungs every 4 (four) hours as needed for wheezing or shortness of breath. 09/27/16  Yes Valentina Shaggy, MD  aspirin EC 81 MG tablet Take 81 mg by mouth daily.   Yes [provider]  budesonide-formoterol (SYMBICORT) 160-4.5 MCG/ACT inhaler Inhale 2 puffs into the lungs 2 (two) times daily. Patient taking differently: Inhale 2 puffs into the lungs daily as needed (for shortness of breath).  09/27/16  Yes Valentina Shaggy, MD  DEXILANT 60 MG capsule TAKE (1) CAPSULE BY MOUTH EVERY DAY. Patient taking differently: Take 60 mg by mouth daily.  08/26/15  Yes [name], PA-C  DULoxetine (CYMBALTA) 60 MG capsule Take 60 mg by mouth daily.   Yes [provider]  fenofibrate (TRICOR) 145 MG tablet Take 145 mg by mouth every evening.  05/28/18  Yes [provider]  folic acid (FOLVITE) 1 MG tablet Take 1 mg by mouth daily.   Yes [provider]  furosemide (LASIX) 40 MG tablet Take 60 mg in the morning, take 40 mg in the evening. Patient taking differently: Take 40-60 mg by mouth See admin instructions. Take 60 mg in the morning, take 40 mg in the evening. 06/20/18  Yes Branch, Alphonse Guild, MD  gabapentin  (NEURONTIN) 600 MG tablet Take 1 tablet (600 mg total) by mouth 2 (two) times daily. Patient taking differently: Take 600 mg by mouth 3 (three) times daily.  02/10/18  Yes Triplett, Tammy, PA-C  insulin detemir (LEVEMIR) 100 UNIT/ML injection Inject 30-60 Units into the skin See admin instructions. 30 units in the morning and 60 units at bedtime   Yes [provider]  losartan (COZAAR) 100 MG tablet Take 100 mg by mouth daily.   Yes [provider]  Magnesium 400 MG TABS Take 400 mg by mouth daily.    Yes [provider]  meloxicam (MOBIC) 7.5 MG tablet Take 2 tablets (15 mg total) by mouth daily as needed for pain. 07/13/18  Yes Virgel Manifold, MD  metFORMIN (GLUCOPHAGE) 1000 MG tablet Take 1,000 mg by mouth 2 (two) times daily with a meal.   Yes [provider]  metoprolol tartrate (LOPRESSOR) 25 MG tablet Take 0.5 tablets (12.5 mg total) by mouth 2 (two) times daily. 04/15/18  Yes Tat, Shanon Brow, MD  Multiple Vitamin (MULTIVITAMIN) tablet Take 1 tablet by mouth every morning.    Yes [provider]  oxyCODONE-acetaminophen (PERCOCET/ROXICET) 5-325 MG tablet Take 1 tablet by mouth every 4 (four) hours as needed for severe pain. 07/13/18  Yes Virgel Manifold, MD  OXYGEN Inhale 3-4 L into the lungs continuous.   Yes [provider]  pioglitazone (ACTOS) 45 MG tablet Take 45 mg by mouth daily.   Yes [provider]  rOPINIRole (REQUIP) 2 MG tablet Take 2 mg by mouth at bedtime.    Yes [provider]  rosuvastatin (CRESTOR) 40 MG tablet Take 1 tablet by mouth every evening. 03/22/18  Yes [provider]  sitaGLIPtin (JANUVIA) 100 MG tablet Take 100 mg by mouth daily.    Yes [provider]  traZODone (DESYREL) 50 MG tablet Take 50 mg by mouth at bedtime.  07/03/18  Yes [provider]    Physical Exam: Vitals:   07/16/18 1556 07/16/18 1558 07/16/18 1605 07/16/18 1930  BP:  (!) 124/58    Pulse:  73  79  Resp:     (!) 23  Temp:   (!) 97.5 F (36.4 C)   TempSrc:  Oral Oral   SpO2:  93%  95%  Weight: 122.5 kg     Height: 5\' 6"  (1.676 m)       Constitutional: NAD, calm, comfortable Eyes: PERRL, lids and conjunctivae normal ENMT: Mucous membranes are moist. Posterior pharynx clear of any exudate or lesions.Normal dentition.  Neck: normal, supple, no masses, no thyromegaly Respiratory: clear to auscultation bilaterally, no wheezing, no crackles. Normal respiratory effort. No accessory muscle  use.  Cardiovascular: Regular rate and rhythm, no murmurs / rubs / gallops. No extremity edema. 2+ pedal pulses. No carotid bruits.  Abdomen: LUQ TTP Musculoskeletal: no clubbing / cyanosis. No joint deformity upper and lower extremities. Good ROM, no contractures. Normal muscle tone.  Skin: no rashes, lesions, ulcers. No induration Neurologic: CN 2-12 grossly intact. Sensation intact, DTR normal. Strength 5/5 in all 4.  Psychiatric: Normal judgment and insight. Alert and oriented x 3. Normal mood.    Labs on Admission: I have personally reviewed following labs and imaging studies  CBC: Recent Labs  Lab 07/16/18 1807  WBC 13.1*  NEUTROABS 11.2*  HGB 8.1*  HCT 27.0*  MCV 97.1  PLT 017*   Basic Metabolic Panel: Recent Labs  Lab 07/16/18 1807  NA 131*  K 5.1  CL 91*  CO2 30  GLUCOSE 166*  BUN 19  CREATININE 1.10  CALCIUM 8.8*  MG 1.9   GFR: Estimated Creatinine Clearance: 82.7 mL/min (by C-G formula based on SCr of 1.1 mg/dL). Liver Function Tests: Recent Labs  Lab 07/16/18 1807  AST 295*  ALT 198*  ALKPHOS 55  BILITOT 0.6  PROT 7.0  ALBUMIN 3.7   Recent Labs  Lab 07/16/18 1807  LIPASE 22   No results for input(s): AMMONIA in the last 168 hours. Coagulation Profile: Recent Labs  Lab 07/16/18 1807  INR 1.23   Cardiac Enzymes: Recent Labs  Lab 07/16/18 1807  TROPONINI 0.14*   BNP (last 3 results) No results for input(s): PROBNP in the last 8760 hours. HbA1C: No  results for input(s): HGBA1C in the last 72 hours. CBG: Recent Labs  Lab 07/16/18 2047  GLUCAP 148*   Lipid Profile: No results for input(s): CHOL, HDL, LDLCALC, TRIG, CHOLHDL, LDLDIRECT in the last 72 hours. Thyroid Function Tests: No results for input(s): TSH, T4TOTAL, FREET4, T3FREE, THYROIDAB in the last 72 hours. Anemia Panel: No results for input(s): VITAMINB12, FOLATE, FERRITIN, TIBC, IRON, RETICCTPCT in the last 72 hours. Urine analysis:    Component Value Date/Time   COLORURINE YELLOW 07/16/2018 1707   APPEARANCEUR CLEAR 07/16/2018 1707   LABSPEC 1.010 07/16/2018 1707   PHURINE 6.0 07/16/2018 1707   GLUCOSEU NEGATIVE 07/16/2018 1707   HGBUR NEGATIVE 07/16/2018 1707   BILIRUBINUR NEGATIVE 07/16/2018 1707   KETONESUR NEGATIVE 07/16/2018 1707   PROTEINUR NEGATIVE 07/16/2018 1707   UROBILINOGEN 0.2 05/13/2015 1440   NITRITE NEGATIVE 07/16/2018 1707   LEUKOCYTESUR NEGATIVE 07/16/2018 1707    Radiological Exams on Admission: Ct Abdomen Pelvis Wo Contrast  Result Date: 07/16/2018 CLINICAL DATA:  Fall 4 days ago with left-sided pain. EXAM: CT ABDOMEN AND PELVIS WITHOUT CONTRAST TECHNIQUE: Multidetector CT imaging of the abdomen and pelvis was performed following the standard protocol without IV contrast. COMPARISON:  04/19/2014 FINDINGS: Lower chest: Heart size upper normal. Patchy ground-glass attenuation noted in the lower lobes bilaterally with chronic volume loss in the right lower lobe, similar to prior. Of note, this patient has situs inversus totalis. Hepatobiliary: No focal abnormality in the liver on this study without intravenous contrast. Gallbladder is distended with pericholecystic edema, most prominent in the region of the gallbladder neck. No intrahepatic or extrahepatic biliary dilation. Pancreas: No focal mass lesion. No dilatation of the main duct. No intraparenchymal cyst. No peripancreatic edema. Spleen: No splenomegaly. No focal mass lesion. Adrenals/Urinary  Tract: No adrenal nodule or mass. Kidneys unremarkable. No evidence for hydroureter. The urinary bladder appears normal for the degree of distention. Stomach/Bowel: Stomach is nondistended. No gastric wall  thickening. No evidence of outlet obstruction. Duodenum is normally positioned as is the ligament of Treitz. There is edema/inflammation associated with the descending and proximal transverse segments of the duodenum. No small bowel wall thickening. No small bowel dilatation. The terminal ileum is normal. The appendix is normal. No gross colonic mass. No colonic wall thickening. No substantial diverticular change. Vascular/Lymphatic: No abdominal aortic aneurysm. There is no gastrohepatic or hepatoduodenal ligament lymphadenopathy. No intraperitoneal or retroperitoneal lymphadenopathy. Upper normal portal caval lymph node is stable in the interval. No pelvic sidewall lymphadenopathy. Reproductive: The prostate gland and seminal vesicles have normal imaging features. Other: Trace fluid is noted adjacent to the liver. Edema is noted within the retroperitoneal tissues tracking inferiorly on the left. Musculoskeletal: No worrisome lytic or sclerotic osseous abnormality. IMPRESSION: 1. Situs inversus totalis. The patient has edema/inflammation around the gallbladder, extending into the gallbladder fossa and hepatoduodenal ligament seen tracking around the descending duodenum and then down the left retroperitoneal space towards the pelvis. Given the situs inversus, these changes are in the left abdomen. Although no gallstones are evident by CT, acute cholecystitis would be a consideration. Epicenter of edema/inflammation arising from the duodenum is also consideration and duodenitis should be considered. No substantial edema in the head of the pancreas to suggest groove pancreatitis. Electronically Signed   By: Misty Stanley [name]   On: 07/16/2018 19:48   Dg Chest 2 View  Result Date: 07/16/2018 CLINICAL DATA:   Abdominal pain EXAM: CHEST - 2 VIEW COMPARISON:  04/21/2018, CT chest 06/08/2018 FINDINGS: Postsurgical changes in the cervical spine. Increased opacity at the right base. No pleural effusion. No pneumothorax. Stable enlarged cardiomediastinal silhouette with right arch and right-sided cardiac apex. Situs inversus as noted on multiple prior studies. IMPRESSION: 1. Increased right basilar opacity may reflect atelectasis or infiltrate. 2. Situs anomaly as noted on previous exams. Stable enlarged cardiomediastinal silhouette with right arch and right cardiac apex. Electronically Signed   By: Donavan Foil [name]   On: 07/16/2018 19:06   Ct Cervical Spine Wo Contrast  Result Date: 07/16/2018 CLINICAL DATA:  Fall with neck pain EXAM: CT CERVICAL SPINE WITHOUT CONTRAST TECHNIQUE: Multidetector CT imaging of the cervical spine was performed without intravenous contrast. Multiplanar CT image reconstructions were also generated. COMPARISON:  X-rays from 12/13/2010 FINDINGS: Alignment: Straightening of normal cervical lordosis evident. Skull base and vertebrae: No acute fracture. No primary bone lesion or focal pathologic process. Soft tissues and spinal canal: No prevertebral fluid or swelling. No visible canal hematoma. Disc levels: Loss of disc height noted at C3-4 and C4-5 with endplate spurring. Patient is status post anterior cervical discectomy with plate at C 5 6 and I7-1. Solid bony fusion noted across the C5-6 and C6-7 disc space. Loss of disc height noted at C7-T1. Upper chest: Irregular pleuroparenchymal opacity identified posterior left lung apex. This is similar to diagnostic chest CT of 04/22/2018 and may reflect scarring. Other: None. IMPRESSION: 1. No acute bony abnormality. 2. Discectomy with anterior fusion at C3-4 and C4-5. 3. Degenerative changes at C3-4, C4-5, and C7-T1. Electronically Signed   By: Misty Stanley [name]   On: 07/16/2018 20:08    EKG: Independently  reviewed.  Assessment/Plan Principal Problem:   Acute cholecystitis Active Problems:   Iron deficiency anemia due to chronic blood loss   Insulin-requiring or dependent type II diabetes mellitus (HCC)   Chronic diastolic CHF (congestive heart failure) (HCC)   COPD (chronic obstructive pulmonary disease) (HCC)   Situs inversus totalis   Chronic  respiratory failure with hypoxia (HCC)   HTN (hypertension)    1. Acute cholecystitis with situs inversus totalis - 1. Cefepime and flagyl 2. Repeat CMP in AM 3. Gen surg at [facility] has requested xfer [facility] 4. Spoke with Dr. Brantley Stage, gen surg will consult in AM 5. NPO 6. IVF: NS at 125 cc/hr 2. COPD - 1. At baseline 2. But on 3L O2 at baseline 3. Cont home nebs 3. Chronic Diastolic CHF - 1. Holding Lasix 2. Continue other home meds 4. HTN - continue home meds 5. Iron def anemia due to chronic blood loss - 1. Looks like they dont have him on PO iron despite iron level of 19 this summer 2. Will start PO iron since HGB trending down over past few months. 6. DM2 - 1. Hold home PO meds 2. Levemir 30 BID (takes 30 and 60 at home) 3. Mod scale SSI Q4H 7. RLL infiltrate vs atelectasis - 1. probably atelectasis 2. Resp status at baseline per patient 3. Will hold off on empiric HCAP treatment for now.  DVT prophylaxis: SCDs Code Status: Full Code Family Communication: No family in room Disposition Plan: Admit to [facility] Consults called: Spoke with Dr. Brantley Stage, gen surg will consult in AM Admission status: Admit to inpatient  Severity of Illness: The appropriate patient status for this patient is INPATIENT. Inpatient status is judged to be reasonable and necessary in order to provide the required intensity of service to ensure the patient's safety. The patient's presenting symptoms, physical exam findings, and initial radiographic and laboratory data in the context of their chronic comorbidities is felt to place them at high risk for further  clinical deterioration. Furthermore, it is not anticipated that the patient will be medically stable for discharge from the hospital within 2 midnights of admission. The following factors support the patient status of inpatient.   " The patient's presenting symptoms include Abd pain. " The worrisome physical exam findings include LUQ TTP. " The initial radiographic and laboratory data are worrisome because of Acute cholecystitis with situs inversus totalis, LFT elevations, WBC 13k, question of RLL PNA vs actelectasis. " The chronic co-morbidities include Situs inversus totalis, COPD on O2 chronically, chronic diastolic CHF.   * I certify that at the point of admission it is my clinical judgment that the patient will require inpatient hospital care spanning beyond 2 midnights from the point of admission due to high intensity of service, high risk for further deterioration and high frequency of surveillance required.Etta Quill DO [facility] Pager [phone] Only works nights!  If 7AM-7PM, please contact the primary day team physician taking care of patient  [email or web address] Password [facility]  07/16/2018, 8:57 PM

## 2018-07-16 NOTE — ED Triage Notes (Signed)
Patient states he had a fall on Halloween night. Patient now c/o L side pain that "goes up to my neck." Patient states he was seen here on 11/1.

## 2018-07-16 NOTE — ED Provider Notes (Signed)
[facility] EMERGENCY DEPARTMENT Provider Note   CSN: [id number] Arrival date & time: 07/16/18  1543     History   Chief Complaint Chief Complaint  Patient presents with  . Flank Pain  . Abdominal Pain    HPI Brett [name] is a 67 y.o. male.  HPI Pt was seen at 1705. Per pt and his family, c/o gradual onset and persistence of constant left "side" pain since yesterday. Pt states he fell 3 days ago onto his back and is also having acute flair of his chronic low back and neck pain. Pt's family states pt's "legs are also more swollen." Pt was evaluated in the ED after his fall 3 days ago, with reassuring workup. Denies CP/palpitations, no cough, no N/V/D, no fevers, no rash, no focal motor weakness, no tingling/numbness in extremities.     Past Medical History:  Diagnosis Date  . Depression   . Dextrocardia   . Diabetes mellitus   . H. pylori infection 11/09/2012   treated with pylera  . HTN (hypertension)    Cholesterol  . Iron deficiency anemia, unspecified 10/18/2012  . Neuropathy   . Pneumonia   . Situs inversus totalis   . Tachycardia     Patient Active Problem List   Diagnosis Date Noted  . Acute on chronic respiratory failure with hypoxia ([place]) 04/21/2018  . COPD with acute exacerbation ([place]) 04/21/2018  . SVT (supraventricular tachycardia) ([place]) 04/14/2018  . Paroxysmal SVT (supraventricular tachycardia) ([place]) 04/14/2018  . Acute on chronic diastolic CHF (congestive heart failure) ([place]) 04/14/2018  . CHF exacerbation ([place]) 04/10/2018  . 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 Restrictive lung disease 11/08/2016  . 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 Moderate persistent asthma, uncomplicated 29/92/4268  . Thrombocytosis ([place]) 10/09/2014  . Abdominal pain 09/23/2014  . Gastritis, Helicobacter pylori 34/19/6222  . Insulin-requiring or dependent type II diabetes mellitus ([place]) 01/03/2014  . 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 GERD (gastroesophageal reflux  disease) 10/18/2012  . Nausea 10/18/2012  . Iron deficiency anemia due to chronic blood loss 10/18/2012  . HIP PAIN 08/31/2010  . SPINAL STENOSIS 06/28/2010  . TENDINITIS, CALCIFIC, SHOULDER, RIGHT 06/14/2010  . IMPINGEMENT SYNDROME 06/14/2010    Past Surgical History:  Procedure Laterality Date  . CATARACT EXTRACTION, BILATERAL  2016  . COLONOSCOPY WITH ESOPHAGOGASTRODUODENOSCOPY (EGD) N/A 11/09/2012   Dr. 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 hpylori on bx- treated with pylera. TCS- normal rectum. 1 diminutive polyp in the mid descending segment. 1-40mm polyp in the mid desending segment o/w the remainder of the colonic mucosa appeared normal. tubular adenoma on bx  . GIVENS CAPSULE STUDY N/A 10/07/2013   no source for anemia or heme positive stool noted  . NECK SURGERY     bone spurs        Home Medications    Prior to Admission medications   Medication Sig Start Date End Date Taking? Authorizing Provider  albuterol (PROAIR HFA) 108 (90 Base) MCG/ACT inhaler Inhale 2 puffs into the lungs every 4 (four) hours as needed for wheezing or shortness of breath. 09/27/16   Valentina Shaggy, MD  aspirin EC 81 MG tablet Take 81 mg by mouth daily.    [provider]  budesonide-formoterol (SYMBICORT) 160-4.5 MCG/ACT inhaler Inhale 2 puffs into the lungs 2 (two) times daily. 09/27/16   Valentina Shaggy, MD  DEXILANT 60 MG capsule TAKE (1) CAPSULE BY MOUTH EVERY DAY. 08/26/15  [name], PA-C  doxycycline (VIBRAMYCIN) 100 MG capsule Take 100 mg by mouth 2 (two) times daily.    [provider]  DULoxetine (CYMBALTA) 60 MG capsule Take 60 mg by mouth daily.    [provider]  fenofibrate (TRICOR) 145 MG tablet  05/28/18   [provider]  folic acid (FOLVITE) 1 MG tablet Take 1 mg by mouth daily.    [provider]  furosemide (LASIX) 40 MG tablet  Take 60 mg in the morning, take 40 mg in the evening. 06/20/18   Arnoldo Lenis, MD  gabapentin (NEURONTIN) 600 MG tablet Take 1 tablet (600 mg total) by mouth 2 (two) times daily. Patient taking differently: Take 600 mg by mouth 3 (three) times daily.  02/10/18   Triplett, Tammy, PA-C  insulin detemir (LEVEMIR) 100 UNIT/ML injection Inject 15-60 Units into the skin 2 (two) times daily. 15 units in the am and 60 units at qhs    [provider]  losartan (COZAAR) 100 MG tablet Take 100 mg by mouth daily.    [provider]  Magnesium 400 MG TABS Take by mouth.    [provider]  meloxicam (MOBIC) 7.5 MG tablet Take 2 tablets (15 mg total) by mouth daily as needed for pain. 07/13/18   Virgel Manifold, MD  metFORMIN (GLUCOPHAGE) 1000 MG tablet Take 1,000 mg by mouth 2 (two) times daily with a meal.    [provider]  methylPREDNISolone (MEDROL DOSEPAK) 4 MG TBPK tablet Take 4 mg by mouth.    [provider]  metoprolol tartrate (LOPRESSOR) 25 MG tablet Take 0.5 tablets (12.5 mg total) by mouth 2 (two) times daily. 04/15/18   Orson Eva, MD  Multiple Vitamin (MULTIVITAMIN) tablet Take 1 tablet by mouth daily.    [provider]  oxyCODONE-acetaminophen (PERCOCET/ROXICET) 5-325 MG tablet Take 1 tablet by mouth every 4 (four) hours as needed for severe pain. 07/13/18   Virgel Manifold, MD  pioglitazone (ACTOS) 45 MG tablet Take 45 mg by mouth daily.    [provider]  rOPINIRole (REQUIP) 2 MG tablet ropinirole 2 mg tablet    [provider]  rosuvastatin (CRESTOR) 40 MG tablet Take 1 tablet by mouth every evening. 03/22/18   [provider]  sitaGLIPtin (JANUVIA) 100 MG tablet Take 100 mg by mouth daily.     [provider]  traMADol (ULTRAM) 50 MG tablet Take 1 tablet (50 mg total) by mouth every 6 (six) hours as needed. 02/10/18   Triplett, Tammy, PA-C  traZODone (DESYREL) 50 MG tablet Take 1 tablet by mouth at bedtime  as needed for sleep. 07/03/18   [provider]    Family History Family History  Problem Relation Age of Onset  . Diabetes Sister        Fx  . Heart defect Sister        Fx  . Arthritis Sister        Fx  . Asthma Sister        Fx  . Kidney disease Sister        Fx  . Pancreatitis Sister     Social History Social History   Tobacco Use  . Smoking status: Never Smoker  . Smokeless tobacco: Current User    Types: Chew  Substance Use Topics  . Alcohol use: No  . Drug use: No     Allergies   Patient has no known allergies.   Review of Systems  Review of Systems  ROS: Statement: All systems negative except as marked or noted in the HPI; Constitutional: Negative for fever and chills. ; ; Eyes: Negative for eye pain, redness and discharge. ; ; ENMT: Negative for ear pain, hoarseness, nasal congestion, sinus pressure and sore throat. ; ; Cardiovascular: Negative for chest pain, palpitations, diaphoresis, dyspnea and +peripheral edema. ; ; Respiratory: Negative for cough, wheezing and stridor. ; ; Gastrointestinal: +abd pain. Negative for nausea, vomiting, diarrhea, blood in stool, hematemesis, jaundice and rectal bleeding. . ; ; Genitourinary: Negative for dysuria, flank pain and hematuria. ; ; Musculoskeletal: +acute flair of chronic back pain and neck pain. Negative for swelling and new trauma.; ; Skin: Negative for pruritus, rash, abrasions, blisters, bruising and skin lesion.; ; Neuro: Negative for headache, lightheadedness and neck stiffness. Negative for weakness, altered level of consciousness, altered mental status, extremity weakness, paresthesias, involuntary movement, seizure and syncope.        Physical Exam Updated Vital Signs BP (!) 124/58 (BP Location: Right Arm)   Pulse 73   Temp (!) 97.5 F (36.4 C) (Oral)   Ht 5\' 6"  (1.676 m)   Wt 122.5 kg   SpO2 93%   BMI 43.58 kg/m   Physical Exam 1710: Physical examination: Vital signs and O2 SAT:  Reviewed; Constitutional: Well developed, Well nourished, Well hydrated, In no acute distress; Head and Face: Normocephalic, Atraumatic; Eyes: EOMI, PERRL, No scleral icterus; ENMT: Mouth and pharynx normal, Mucous membranes moist; Neck: Supple, Trachea midline. No abrasions or ecchymosis.; Spine:  No midline CS, TS, LS tenderness. +mild TTP left lumbar paraspinal and left trapezius muscles. No rash, no ecchymosis..; Cardiovascular: Regular rate and rhythm, No gallop; Respiratory: Breath sounds coarse & equal bilaterally, No wheezes, Moist cough. Normal respiratory effort/excursion; Chest: Nontender, No deformity, Movement normal, No crepitus, No abrasions or ecchymosis.; Abdomen: Large. Soft, +left sided abd tender to palp. No rebound or guarding. Nondistended, Normal bowel sounds, No abrasions or ecchymosis.; Genitourinary: No CVA tenderness;; Extremities: Full range of motion major/large joints of bilat UE's and LE's without pain or tenderness to palp, Neurovascularly intact, Pulses normal, No deformity. No tenderness, +1 pedal edema bilat. No calf asymmetry. Pelvis stable; Neuro: AA&Ox3, GCS 15.  Major CN grossly intact. Speech clear. No gross focal motor or sensory deficits in extremities.; Skin: Color normal, Warm, Dry    ED Treatments / Results  Labs (all labs ordered are listed, but only abnormal results are displayed)   EKG None  Radiology   Procedures Procedures (including critical care time)  Medications Ordered in ED Medications  fentaNYL (SUBLIMAZE) injection 50 mcg (has no administration in time range)     Initial Impression / Assessment and Plan / ED Course  I have reviewed the triage vital signs and the nursing notes.  Pertinent labs & imaging results that were available during my care of the patient were reviewed by me and considered in my medical decision making (see chart for details).  MDM Reviewed: previous chart, nursing note and vitals Reviewed previous: labs and  ECG Interpretation: labs, ECG, x-ray and CT scan    Results for orders placed or performed during the hospital encounter of 07/16/18  Comprehensive metabolic panel  Result Value Ref Range   Sodium 131 (L) 135 - 145 mmol/L   Potassium 5.1 3.5 - 5.1 mmol/L   Chloride 91 (L) 98 - 111 mmol/L   CO2 30 22 - 32 mmol/L   Glucose, Bld 166 (H) 70 - 99 mg/dL   BUN  19 8 - 23 mg/dL   Creatinine, Ser 1.10 0.61 - 1.24 mg/dL   Calcium 8.8 (L) 8.9 - 10.3 mg/dL   Total Protein 7.0 6.5 - 8.1 g/dL   Albumin 3.7 3.5 - 5.0 g/dL   AST 295 (H) 15 - 41 U/L   ALT 198 (H) 0 - 44 U/L   Alkaline Phosphatase 55 38 - 126 U/L   Total Bilirubin 0.6 0.3 - 1.2 mg/dL   GFR calc non Af Amer >60 >60 mL/min   GFR calc Af Amer >60 >60 mL/min   Anion gap 10 5 - 15  Lipase, blood  Result Value Ref Range   Lipase 22 11 - 51 U/L  Brain natriuretic peptide  Result Value Ref Range   B Natriuretic Peptide 393.0 (H) 0.0 - 100.0 pg/mL  Troponin I  Result Value Ref Range   Troponin I 0.14 (HH) <0.03 ng/mL  CBC with Differential  Result Value Ref Range   WBC 13.1 (H) 4.0 - 10.5 K/uL   RBC 2.78 (L) 4.22 - 5.81 MIL/uL   Hemoglobin 8.1 (L) 13.0 - 17.0 g/dL   HCT 27.0 (L) 39.0 - 52.0 %   MCV 97.1 80.0 - 100.0 fL   MCH 29.1 26.0 - 34.0 pg   MCHC 30.0 30.0 - 36.0 g/dL   RDW 14.6 11.5 - 15.5 %   Platelets 408 (H) 150 - 400 K/uL   nRBC 0.2 0.0 - 0.2 %   Neutrophils Relative % 86 %   Neutro Abs 11.2 (H) 1.7 - 7.7 K/uL   Lymphocytes Relative 7 %   Lymphs Abs 0.9 0.7 - 4.0 K/uL   Monocytes Relative 7 %   Monocytes Absolute 0.9 0.1 - 1.0 K/uL   Eosinophils Relative 0 %   Eosinophils Absolute 0.0 0.0 - 0.5 K/uL   Basophils Relative 0 %   Basophils Absolute 0.0 0.0 - 0.1 K/uL   Immature Granulocytes 0 %   Abs Immature Granulocytes 0.05 0.00 - 0.07 K/uL  Protime-INR  Result Value Ref Range   Prothrombin Time 15.4 (H) 11.4 - 15.2 seconds   INR 1.23   Urinalysis, Routine w reflex microscopic  Result Value Ref Range    Color, Urine YELLOW YELLOW   APPearance CLEAR CLEAR   Specific Gravity, Urine 1.010 1.005 - 1.030   pH 6.0 5.0 - 8.0   Glucose, UA NEGATIVE NEGATIVE mg/dL   Hgb urine dipstick NEGATIVE NEGATIVE   Bilirubin Urine NEGATIVE NEGATIVE   Ketones, ur NEGATIVE NEGATIVE mg/dL   Protein, ur NEGATIVE NEGATIVE mg/dL   Nitrite NEGATIVE NEGATIVE   Leukocytes, UA NEGATIVE NEGATIVE  Magnesium  Result Value Ref Range   Magnesium 1.9 1.7 - 2.4 mg/dL  CBG monitoring, ED  Result Value Ref Range   Glucose-Capillary 148 (H) 70 - 99 mg/dL   Ct Abdomen Pelvis Wo Contrast Result Date: 07/16/2018 CLINICAL DATA:  Fall 4 days ago with left-sided pain. EXAM: CT ABDOMEN AND PELVIS WITHOUT CONTRAST TECHNIQUE: Multidetector CT imaging of the abdomen and pelvis was performed following the standard protocol without IV contrast. COMPARISON:  04/19/2014 FINDINGS: Lower chest: Heart size upper normal. Patchy ground-glass attenuation noted in the lower lobes bilaterally with chronic volume loss in the right lower lobe, similar to prior. Of note, this patient has situs inversus totalis. Hepatobiliary: No focal abnormality in the liver on this study without intravenous contrast. Gallbladder is distended with pericholecystic edema, most prominent in the region of the gallbladder neck. No intrahepatic or extrahepatic biliary dilation. Pancreas:  No focal mass lesion. No dilatation of the main duct. No intraparenchymal cyst. No peripancreatic edema. Spleen: No splenomegaly. No focal mass lesion. Adrenals/Urinary Tract: No adrenal nodule or mass. Kidneys unremarkable. No evidence for hydroureter. The urinary bladder appears normal for the degree of distention. Stomach/Bowel: Stomach is nondistended. No gastric wall thickening. No evidence of outlet obstruction. Duodenum is normally positioned as is the ligament of Treitz. There is edema/inflammation associated with the descending and proximal transverse segments of the duodenum. No small  bowel wall thickening. No small bowel dilatation. The terminal ileum is normal. The appendix is normal. No gross colonic mass. No colonic wall thickening. No substantial diverticular change. Vascular/Lymphatic: No abdominal aortic aneurysm. There is no gastrohepatic or hepatoduodenal ligament lymphadenopathy. No intraperitoneal or retroperitoneal lymphadenopathy. Upper normal portal caval lymph node is stable in the interval. No pelvic sidewall lymphadenopathy. Reproductive: The prostate gland and seminal vesicles have normal imaging features. Other: Trace fluid is noted adjacent to the liver. Edema is noted within the retroperitoneal tissues tracking inferiorly on the left. Musculoskeletal: No worrisome lytic or sclerotic osseous abnormality. IMPRESSION: 1. Situs inversus totalis. The patient has edema/inflammation around the gallbladder, extending into the gallbladder fossa and hepatoduodenal ligament seen tracking around the descending duodenum and then down the left retroperitoneal space towards the pelvis. Given the situs inversus, these changes are in the left abdomen. Although no gallstones are evident by CT, acute cholecystitis would be a consideration. Epicenter of edema/inflammation arising from the duodenum is also consideration and duodenitis should be considered. No substantial edema in the head of the pancreas to suggest groove pancreatitis. Electronically Signed   By: Misty Stanley [name]   On: 07/16/2018 19:48   Dg Chest 2 View Result Date: 07/16/2018 CLINICAL DATA:  Abdominal pain EXAM: CHEST - 2 VIEW COMPARISON:  04/21/2018, CT chest 06/08/2018 FINDINGS: Postsurgical changes in the cervical spine. Increased opacity at the right base. No pleural effusion. No pneumothorax. Stable enlarged cardiomediastinal silhouette with right arch and right-sided cardiac apex. Situs inversus as noted on multiple prior studies. IMPRESSION: 1. Increased right basilar opacity may reflect atelectasis or infiltrate. 2.  Situs anomaly as noted on previous exams. Stable enlarged cardiomediastinal silhouette with right arch and right cardiac apex. Electronically Signed   By: Donavan Foil [name]   On: 07/16/2018 19:06     2000:  LFT's newly elevated with CT findings of possible acute cholecystitis vs duodenitis. Will start IV abx and dose pepcid. Possible HCAP on CXR.  T/C returned from General Surgeon Dr. Constance Haw, case discussed, including:  HPI, pertinent PM/SHx, VS/PE, dx testing, ED course and treatment:  Agrees OK to cover possible GB and HCAP with IV cefepime/vanc for now, given pt's complexity, pt will need transfer/admit to [facility] for further consultant input.   2030:  T/C returned from [facility] Dr. Alcario Drought, case discussed, including:  HPI, pertinent PM/SHx, VS/PE, dx testing, ED course and treatment:  Agreeable to admit/transfer to [facility].       Final Clinical Impressions(s) / ED Diagnoses   Final diagnoses:  None    ED Discharge Orders    None       Francine Graven, DO 07/21/18 7062

## 2018-07-16 NOTE — Progress Notes (Addendum)
Pharmacy Antibiotic Note  Brett [name] is a 67 y.o. male admitted on 07/16/2018 with pneumonia and intra abdominal infection.  Pharmacy has been consulted for vancomycin and cefepime dosing.  Plan:  Vancomycin 2gm iv x 1  Vancomycin 1000mg  IV every 12 hours.  Goal trough 15-20 mcg/mL. cefepime 2gm iv q8h  Height: 5\' 6"  (167.6 cm) Weight: 270 lb (122.5 kg) IBW/kg (Calculated) : 63.8  Temp (24hrs), Avg:97.5 F (36.4 C), Min:97.5 F (36.4 C), Max:97.5 F (36.4 C)  Recent Labs  Lab 07/16/18 1807  WBC 13.1*  CREATININE 1.10    Estimated Creatinine Clearance: 82.7 mL/min (by C-G formula based on SCr of 1.1 mg/dL).    No Known Allergies  Antimicrobials this admission: 11/4 metronidazole >>  11/4 cefepime >>  11/4 vancomycin >>   Microbiology results: 11/4 BCx: sent 11/4 UCx: sent 11/4 MRSA PCR: sent  Thank you for allowing pharmacy to be a part of this patient's care.  Donna Christen Stoy Fenn 07/16/2018 8:24 PM

## 2018-07-17 ENCOUNTER — Other Ambulatory Visit: Payer: Self-pay

## 2018-07-17 ENCOUNTER — Inpatient Hospital Stay (HOSPITAL_COMMUNITY): Payer: 59

## 2018-07-17 LAB — COMPREHENSIVE METABOLIC PANEL
ALBUMIN: 3.5 g/dL (ref 3.5–5.0)
ALT: 249 U/L — ABNORMAL HIGH (ref 0–44)
ANION GAP: 8 (ref 5–15)
AST: 385 U/L — AB (ref 15–41)
Alkaline Phosphatase: 55 U/L (ref 38–126)
BUN: 16 mg/dL (ref 8–23)
CO2: 31 mmol/L (ref 22–32)
Calcium: 9.1 mg/dL (ref 8.9–10.3)
Chloride: 95 mmol/L — ABNORMAL LOW (ref 98–111)
Creatinine, Ser: 0.95 mg/dL (ref 0.61–1.24)
GFR calc Af Amer: >60 mL/min (ref >60)
GFR calc non Af Amer: >60 mL/min (ref >60)
GLUCOSE: 71 mg/dL (ref 70–99)
POTASSIUM: 4.6 mmol/L (ref 3.5–5.1)
SODIUM: 134 mmol/L — AB (ref 135–145)
Total Bilirubin: 0.5 mg/dL (ref 0.3–1.2)
Total Protein: 6.8 g/dL (ref 6.5–8.1)

## 2018-07-17 LAB — CBC
HCT: 25.8 % — ABNORMAL LOW (ref 39.0–52.0)
HEMOGLOBIN: 7.5 g/dL — AB (ref 13.0–17.0)
MCH: 28 pg (ref 26.0–34.0)
MCHC: 29.1 g/dL — AB (ref 30.0–36.0)
MCV: 96.3 fL (ref 80.0–100.0)
NRBC: 0.4 % — AB (ref 0.0–0.2)
Platelets: 395 10*3/uL (ref 150–400)
RBC: 2.68 MIL/uL — ABNORMAL LOW (ref 4.22–5.81)
RDW: 14.8 % (ref 11.5–15.5)
WBC: 11.3 10*3/uL — AB (ref 4.0–10.5)

## 2018-07-17 LAB — CBG MONITORING, ED
GLUCOSE-CAPILLARY: 82 mg/dL (ref 70–99)
GLUCOSE-CAPILLARY: 46 mg/dL — AB (ref 70–99)
GLUCOSE-CAPILLARY: 92 mg/dL (ref 70–99)
Glucose-Capillary: 107 mg/dL — ABNORMAL HIGH (ref 70–99)
Glucose-Capillary: 30 mg/dL — CL (ref 70–99)
Glucose-Capillary: 118 mg/dL — ABNORMAL HIGH (ref 70–99)

## 2018-07-17 LAB — TYPE AND SCREEN
ABO/RH(D): O POS
ANTIBODY SCREEN: NEGATIVE

## 2018-07-17 LAB — GLUCOSE, CAPILLARY: Glucose-Capillary: 81 mg/dL (ref 70–99)

## 2018-07-17 LAB — TROPONIN I
Troponin I: 0.14 ng/mL (ref <0.03)
Troponin I: 0.12 ng/mL (ref <0.03)

## 2018-07-17 MED ORDER — INSULIN DETEMIR 100 UNIT/ML ~~LOC~~ SOLN
20.0000 [IU] | Freq: Every day | SUBCUTANEOUS | Status: DC
  Filled 2018-07-17: qty 0.2

## 2018-07-17 MED ORDER — INSULIN ASPART 100 UNIT/ML ~~LOC~~ SOLN: 0.0000 [IU] | Freq: Three times a day (TID) | SUBCUTANEOUS | Status: DC

## 2018-07-17 MED ORDER — INSULIN DETEMIR 100 UNIT/ML ~~LOC~~ SOLN
30.0000 [IU] | Freq: Every day | SUBCUTANEOUS | Status: DC
  Filled 2018-07-17: qty 0.3

## 2018-07-17 MED ORDER — DEXTROSE 50 % IV SOLN
1.0000 | Freq: Once | INTRAVENOUS | Status: AC
  Administered 2018-07-17: 50 mL via INTRAVENOUS

## 2018-07-17 MED ORDER — DEXTROSE-NACL 5-0.45 % IV SOLN
INTRAVENOUS | Status: DC
  Administered 2018-07-17 – 2018-07-18 (×2): via INTRAVENOUS

## 2018-07-17 MED ORDER — DEXTROSE 50 % IV SOLN
INTRAVENOUS | Status: AC
  Filled 2018-07-17: qty 50

## 2018-07-17 MED ORDER — DEXTROSE 50 % IV SOLN
INTRAVENOUS | Status: AC
  Administered 2018-07-17: 50 mL via INTRAVENOUS
  Filled 2018-07-17: qty 50

## 2018-07-17 NOTE — ED Notes (Signed)
Report given to [facility] at this time with [facility]. ETA 15 min

## 2018-07-17 NOTE — Progress Notes (Addendum)
PROGRESS NOTE    Brett [name]  EUM:[id number] DOB: 04-05-1951 DOA: 07/16/2018 PCP: [name], MD     Brief Narrative:  67 y.o. 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 Presented to ED with chills, nausea, vomiting and left upper quadrant pain. LFT's elevated and workup demonstrating acute cholecystitis. Patient found to be high risk for surgical intervention at [facility] and will be transfer to [facility] for further evaluation and management. Dr. Brantley Stage from surgical service in agreement to consult on patient.   Assessment & Plan: 1-Acute cholecystitis -Based on patient past medical history he is moderate to high risk for any kind of surgical intervention. -Will recommend cardiology consultation for clearance -Continue antibiotics, antiemetics, analgesics and IV fluids in an attempt to cool off acute inflammatory process from cholecystitis. -Surgery has been consulted and will see patient once transfer for further decision on his care. -If patient found to be too high risk for cholecystectomy, then IR may need to be consulted for cholecystostomy. -will follow clinical response. -keep NPO  2-Iron deficiency anemia due to chronic blood loss -will continue PPI -Hgb 7.5 currently -will check type and screen -patient will most likely need transfusion during this hospitalization; discussed with patient and wife at bedside and they are not opposed.  3-Insulin-requiring or dependent type II diabetes mellitus (Brett [name]) -Patient with two episodes of hypoglycemia after being n.p.o. and receiving insulin therapy PTA. -Long-acting insulin and SSI discontinued. -IVF's changed to D51/2 NS to better support him and avoid further low CBG's -continue to check CBG's q4 hours while NPO. -PRN use of D50 amp  4-Chronic diastolic CHF (congestive heart failure) (HCC) -follow daily weight and strict intake and output -no crackles on  exam -overall compensated  5-chronic resp failure due to COPD (chronic obstructive pulmonary disease) (HCC) -mild wheezing on exam -good O2 sat on chronic oxygen supplementation -will continue home inhaler/nebulizer regimen.  6-transaminitis -Most likely associated with acute inflammatory process from cholecystitis -Will follow LFTs trend -Abdominal ultrasound has been ordered to assess CBD -base on [place] results and trend of LFT's patient will benefit of GI consultation. -hold Crestor at this moment.  7-HTN (hypertension) -continue cozaar and metoprolol  8-chronically elevated troponin -follow trend and monitor on telemetry initially -denies CP at this time. -Cardiology consult for clearance and to determine the need of any other testing before surgery recommended.  9-morbid obesity -Body mass index is 43.58 kg/m. -discussed importance of drastic weight loss strategy, portion control and low calorie diet.  DVT prophylaxis: SCDs Code Status: Full code Family Communication: wife at bedside  Disposition Plan: Patient will be transferred to [facility] for further evaluation and management of acute cholecystitis.  He will need cardiology clearance and if needed inputs from gastroenterology and also interventional radiologist.  Consultants:   General surgery (Dr. Brantley Stage)  Procedures:   See below for x-ray reports.  Antimicrobials:  Anti-infectives (From admission, onward)   Start     Dose/Rate Route Frequency Ordered Stop   07/17/18 0800  vancomycin (VANCOCIN) IVPB 1000 mg/200 mL premix  Status:  Discontinued     1,000 mg 200 mL/hr over 60 Minutes Intravenous Every 12 hours 07/16/18 2024 07/16/18 2058   07/17/18 0300  ceFEPIme (MAXIPIME) 2 g in sodium chloride 0.9 % 100 mL IVPB     2 g 200 mL/hr over 30 Minutes Intravenous Every 8 hours 07/16/18 2024     07/16/18 2200  ceFEPIme (MAXIPIME)  1 g in sodium chloride 0.9 % 100 mL IVPB  Status:  Discontinued     1 g 200  mL/hr over 30 Minutes Intravenous Every 8 hours 07/16/18 2004 07/17/18 0422   07/16/18 2100  metroNIDAZOLE (FLAGYL) IVPB 500 mg     500 mg 100 mL/hr over 60 Minutes Intravenous Every 8 hours 07/16/18 2018     07/16/18 2030  vancomycin (VANCOCIN) 2,000 mg in sodium chloride 0.9 % 500 mL IVPB  Status:  Discontinued     2,000 mg 250 mL/hr over 120 Minutes Intravenous  Once 07/16/18 2024 07/16/18 2111      Subjective: Afebrile currently denying chest pain and shortness of breath.  Patient reports feeling intermittently nauseated and is having left upper quadrant pain.  Objective: Vitals:   07/17/18 0600 07/17/18 0630 07/17/18 0645 07/17/18 0700  BP: (!) 143/62 138/68  (!) 146/56  Pulse:   73 70  Resp: 16 16 (!) 21 (!) 22  Temp:      TempSrc:      SpO2:   94% 95%  Weight:      Height:        Intake/Output Summary (Last 24 hours) at 07/17/2018 0845 Last data filed at 07/17/2018 0143 Gross per 24 hour  Intake 600 ml  Output -  Net 600 ml   Filed Weights   07/16/18 1556  Weight: 122.5 kg    Examination: General exam: Alert, awake, oriented x 3; reports intermittent nausea and left upper quadrant pain.  Patient is afebrile at this time.  Patient is morbidly obese.  Wearing 3-4 L nasal cannula oxygen supplementation Respiratory system: Positive rhonchi, mild end expiratory wheezing.  Good air movement bilaterally, no crackles.   Cardiovascular system: RRR. No rubs or gallops.  Due to his body habitus unable to assess for JVD. Gastrointestinal system: Abdomen is obese, tender to palpation on his left upper quadrant and mid abdomen.  Decreased bowel sounds on exam.  Patient reported that he is passing gas.  No guarding. Central nervous system: Alert and oriented. No focal neurological deficits. Extremities: No cyanosis or clubbing; trace edema appreciated bilaterally. Skin: No rashes, no petechiae Psychiatry: Judgement and insight appear normal. Mood & affect appropriate.    Data  Reviewed: I have personally reviewed following labs and imaging studies  CBC: Recent Labs  Lab 07/16/18 1807 07/17/18 0314  WBC 13.1* 11.3*  NEUTROABS 11.2*  --   HGB 8.1* 7.5*  HCT 27.0* 25.8*  MCV 97.1 96.3  PLT 408* 952   Basic Metabolic Panel: Recent Labs  Lab 07/16/18 1807 07/17/18 0314  NA 131* 134*  K 5.1 4.6  CL 91* 95*  CO2 30 31  GLUCOSE 166* 71  BUN 19 16  CREATININE 1.10 0.95  CALCIUM 8.8* 9.1  MG 1.9  --    GFR: Estimated Creatinine Clearance: 95.7 mL/min (by C-G formula based on SCr of 0.95 mg/dL).   Liver Function Tests: Recent Labs  Lab 07/16/18 1807 07/17/18 0314  AST 295* 385*  ALT 198* 249*  ALKPHOS 55 55  BILITOT 0.6 0.5  PROT 7.0 6.8  ALBUMIN 3.7 3.5   Recent Labs  Lab 07/16/18 1807  LIPASE 22   Coagulation Profile: Recent Labs  Lab 07/16/18 1807  INR 1.23   Cardiac Enzymes: Recent Labs  Lab 07/16/18 1807 07/16/18 2058 07/17/18 0314  TROPONINI 0.14* 0.17* 0.14*   CBG: Recent Labs  Lab 07/16/18 2047 07/17/18 0237 07/17/18 0633 07/17/18 0734  GLUCAP 148* 107* 30* 82  Urine analysis:    Component Value Date/Time   COLORURINE YELLOW 07/16/2018 1707   APPEARANCEUR CLEAR 07/16/2018 1707   LABSPEC 1.010 07/16/2018 1707   PHURINE 6.0 07/16/2018 1707   GLUCOSEU NEGATIVE 07/16/2018 1707   HGBUR NEGATIVE 07/16/2018 1707   BILIRUBINUR NEGATIVE 07/16/2018 1707   KETONESUR NEGATIVE 07/16/2018 1707   PROTEINUR NEGATIVE 07/16/2018 1707   UROBILINOGEN 0.2 05/13/2015 1440   NITRITE NEGATIVE 07/16/2018 1707   LEUKOCYTESUR NEGATIVE 07/16/2018 1707    Recent Results (from the past 240 hour(s))  Culture, blood (routine x 2) Call MD if unable to obtain prior to antibiotics being given     Status: None (Preliminary result)   Collection Time: 07/16/18  8:22 PM  Result Value Ref Range Status   Specimen Description BLOOD LEFT ARM  Final   Special Requests   Final    BOTTLES DRAWN AEROBIC AND ANAEROBIC Blood Culture adequate  volume Performed at [facility], [address] [place], [place] [address]    Culture PENDING  Incomplete   Report Status PENDING  Incomplete  MRSA PCR Screening     Status: None   Collection Time: 07/16/18  8:44 PM  Result Value Ref Range Status   MRSA by PCR NEGATIVE NEGATIVE Final    Comment:        The GeneXpert MRSA Assay (FDA approved for NASAL specimens only), is one component of a comprehensive MRSA colonization surveillance program. It is not intended to diagnose MRSA infection nor to guide or monitor treatment for MRSA infections. Performed at [facility], [address] [place], [place] [address]      Radiology Studies: Ct Abdomen Pelvis Wo Contrast  Result Date: 07/16/2018 CLINICAL DATA:  Fall 4 days ago with left-sided pain. EXAM: CT ABDOMEN AND PELVIS WITHOUT CONTRAST TECHNIQUE: Multidetector CT imaging of the abdomen and pelvis was performed following the standard protocol without IV contrast. COMPARISON:  04/19/2014 FINDINGS: Lower chest: Heart size upper normal. Patchy ground-glass attenuation noted in the lower lobes bilaterally with chronic volume loss in the right lower lobe, similar to prior. Of note, this patient has situs inversus totalis. Hepatobiliary: No focal abnormality in the liver on this study without intravenous contrast. Gallbladder is distended with pericholecystic edema, most prominent in the region of the gallbladder neck. No intrahepatic or extrahepatic biliary dilation. Pancreas: No focal mass lesion. No dilatation of the main duct. No intraparenchymal cyst. No peripancreatic edema. Spleen: No splenomegaly. No focal mass lesion. Adrenals/Urinary Tract: No adrenal nodule or mass. Kidneys unremarkable. No evidence for hydroureter. The urinary bladder appears normal for the degree of distention. Stomach/Bowel: Stomach is nondistended. No gastric wall thickening. No evidence of outlet obstruction. Duodenum is normally positioned as is the ligament of  Treitz. There is edema/inflammation associated with the descending and proximal transverse segments of the duodenum. No small bowel wall thickening. No small bowel dilatation. The terminal ileum is normal. The appendix is normal. No gross colonic mass. No colonic wall thickening. No substantial diverticular change. Vascular/Lymphatic: No abdominal aortic aneurysm. There is no gastrohepatic or hepatoduodenal ligament lymphadenopathy. No intraperitoneal or retroperitoneal lymphadenopathy. Upper normal portal caval lymph node is stable in the interval. No pelvic sidewall lymphadenopathy. Reproductive: The prostate gland and seminal vesicles have normal imaging features. Other: Trace fluid is noted adjacent to the liver. Edema is noted within the retroperitoneal tissues tracking inferiorly on the left. Musculoskeletal: No worrisome lytic or sclerotic osseous abnormality. IMPRESSION: 1. Situs inversus totalis. The patient has edema/inflammation around the gallbladder, extending into  the gallbladder fossa and hepatoduodenal ligament seen tracking around the descending duodenum and then down the left retroperitoneal space towards the pelvis. Given the situs inversus, these changes are in the left abdomen. Although no gallstones are evident by CT, acute cholecystitis would be a consideration. Epicenter of edema/inflammation arising from the duodenum is also consideration and duodenitis should be considered. No substantial edema in the head of the pancreas to suggest groove pancreatitis. Electronically Signed   By: Misty Stanley [name]   On: 07/16/2018 19:48   Dg Chest 2 View  Result Date: 07/16/2018 CLINICAL DATA:  Abdominal pain EXAM: CHEST - 2 VIEW COMPARISON:  04/21/2018, CT chest 06/08/2018 FINDINGS: Postsurgical changes in the cervical spine. Increased opacity at the right base. No pleural effusion. No pneumothorax. Stable enlarged cardiomediastinal silhouette with right arch and right-sided cardiac apex. Situs  inversus as noted on multiple prior studies. IMPRESSION: 1. Increased right basilar opacity may reflect atelectasis or infiltrate. 2. Situs anomaly as noted on previous exams. Stable enlarged cardiomediastinal silhouette with right arch and right cardiac apex. Electronically Signed   By: Donavan Foil [name]   On: 07/16/2018 19:06   Dg Lumbar Spine 2-3 Views  Result Date: 07/16/2018 CLINICAL DATA:  Chronic low back pain radiating down the left leg for several years. EXAM: LUMBAR SPINE - 2-3 VIEW COMPARISON:  Lumbar spine radiographs 07/13/2018. CT abdomen and pelvis 07/16/2018. Lumbar spine MRI 06/18/2018 FINDINGS: There are 5 non rib-bearing lumbar type vertebrae. There is mild lumbar dextroscoliosis. There is no listhesis. Mild superior endplate compression deformities at L1 and L2 are new from last month's MRI and are without retropulsion. There is mild lower thoracic and lumbar spondylosis, unchanged. IMPRESSION: Mild L1 and L2 compression fractures, new from a 06/18/2018 MRI. Electronically Signed   By: Logan Bores [name]   On: 07/16/2018 21:48   Ct Cervical Spine Wo Contrast  Result Date: 07/16/2018 CLINICAL DATA:  Fall with neck pain EXAM: CT CERVICAL SPINE WITHOUT CONTRAST TECHNIQUE: Multidetector CT imaging of the cervical spine was performed without intravenous contrast. Multiplanar CT image reconstructions were also generated. COMPARISON:  X-rays from 12/13/2010 FINDINGS: Alignment: Straightening of normal cervical lordosis evident. Skull base and vertebrae: No acute fracture. No primary bone lesion or focal pathologic process. Soft tissues and spinal canal: No prevertebral fluid or swelling. No visible canal hematoma. Disc levels: Loss of disc height noted at C3-4 and C4-5 with endplate spurring. Patient is status post anterior cervical discectomy with plate at C 5 6 and I9-5. Solid bony fusion noted across the C5-6 and C6-7 disc space. Loss of disc height noted at C7-T1. Upper chest: Irregular  pleuroparenchymal opacity identified posterior left lung apex. This is similar to diagnostic chest CT of 04/22/2018 and may reflect scarring. Other: None. IMPRESSION: 1. No acute bony abnormality. 2. Discectomy with anterior fusion at C3-4 and C4-5. 3. Degenerative changes at C3-4, C4-5, and C7-T1. Electronically Signed   By: Misty Stanley [name]   On: 07/16/2018 20:08   US Abdomen Limited  Result Date: 07/17/2018 CLINICAL DATA:  History of situs inversus.  Nausea. EXAM: ULTRASOUND ABDOMEN LIMITED left UPPER QUADRANT COMPARISON:  CT scan of July 16, 2018. FINDINGS: Gallbladder: No gallstones or wall thickening visualized. No sonographic Murphy sign noted by sonographer. Small amount of pericholecystic fluid is noted. Common bile duct: Diameter: 5 mm which is within normal limits. Liver: No focal lesion identified. Within normal limits in parenchymal echogenicity. Portal vein is patent on color Doppler imaging with normal direction of  blood flow towards the liver. IMPRESSION: Situs inversus is noted. Small amount of pericholecystic fluid is noted around the gallbladder, although no gallbladder wall thickening or cholelithiasis is noted. No biliary dilatation is noted. Liver is unremarkable. Electronically Signed   By: Marijo Conception, [name]   On: 07/17/2018 08:24    Scheduled Meds: . aspirin EC  81 mg Oral Daily  . DULoxetine  60 mg Oral Daily  . fenofibrate  160 mg Oral Daily  . ferrous sulfate  325 mg Oral BID WC  . folic acid  1 mg Oral Daily  . gabapentin  600 mg Oral TID  . insulin aspart  0-9 Units Subcutaneous TID WC  . insulin detemir  20 Units Subcutaneous QHS  . losartan  100 mg Oral Daily  . magnesium oxide  400 mg Oral Daily  . metoprolol tartrate  12.5 mg Oral BID  . mometasone-formoterol  2 puff Inhalation BID  . multivitamin with minerals  1 tablet Oral q morning - 10a  . pantoprazole  40 mg Oral Daily  . rOPINIRole  2 mg Oral QHS  . rosuvastatin  40 mg Oral QPM  . traZODone  50  mg Oral QHS   Continuous Infusions: . ceFEPime (MAXIPIME) IV    . dextrose 5 % and 0.45% NaCl    . metronidazole Stopped (07/17/18 0640)     LOS: 1 day    Time spent: 30 minutes    Barton Dubois, MD [facility] Pager [phone]  If 7PM-7AM, please contact night-coverage [email or web address] Password [email or web address] 07/17/2018, 8:45 AM

## 2018-07-17 NOTE — Progress Notes (Signed)
Text sent to K. Schorr about pt arrival to unit.

## 2018-07-17 NOTE — ED Notes (Signed)
Pt moved to hospital bed for comfort.

## 2018-07-17 NOTE — ED Notes (Signed)
CBG 30 notified English as a second language teacher hospitalist paged.

## 2018-07-17 NOTE — ED Notes (Signed)
CBG 45 RN notified.

## 2018-07-18 ENCOUNTER — Encounter (HOSPITAL_COMMUNITY): Payer: Self-pay | Admitting: General Practice

## 2018-07-18 ENCOUNTER — Inpatient Hospital Stay (HOSPITAL_COMMUNITY): Payer: 59

## 2018-07-18 DIAGNOSIS — I5032 Chronic diastolic (congestive) heart failure: Secondary | ICD-10-CM

## 2018-07-18 DIAGNOSIS — K2980 Duodenitis without bleeding: Secondary | ICD-10-CM

## 2018-07-18 DIAGNOSIS — K810 Acute cholecystitis: Secondary | ICD-10-CM

## 2018-07-18 DIAGNOSIS — J189 Pneumonia, unspecified organism: Secondary | ICD-10-CM

## 2018-07-18 LAB — URINE CULTURE: CULTURE: NO GROWTH

## 2018-07-18 LAB — CBC
HCT: 26.8 % — ABNORMAL LOW (ref 39.0–52.0)
HEMOGLOBIN: 7.8 g/dL — AB (ref 13.0–17.0)
MCH: 28.1 pg (ref 26.0–34.0)
MCHC: 29.1 g/dL — ABNORMAL LOW (ref 30.0–36.0)
MCV: 96.4 fL (ref 80.0–100.0)
Platelets: 445 10*3/uL — ABNORMAL HIGH (ref 150–400)
RBC: 2.78 MIL/uL — ABNORMAL LOW (ref 4.22–5.81)
RDW: 14.8 % (ref 11.5–15.5)
WBC: 11.8 10*3/uL — ABNORMAL HIGH (ref 4.0–10.5)
nRBC: 0.6 % — ABNORMAL HIGH (ref 0.0–0.2)

## 2018-07-18 LAB — APTT: APTT: 35 s (ref 24–36)

## 2018-07-18 LAB — COMPREHENSIVE METABOLIC PANEL
ALK PHOS: 59 U/L (ref 38–126)
ALT: 206 U/L — AB (ref 0–44)
AST: 170 U/L — AB (ref 15–41)
Albumin: 2.9 g/dL — ABNORMAL LOW (ref 3.5–5.0)
Anion gap: 8 (ref 5–15)
BUN: 12 mg/dL (ref 8–23)
CHLORIDE: 97 mmol/L — AB (ref 98–111)
CO2: 32 mmol/L (ref 22–32)
CREATININE: 0.94 mg/dL (ref 0.61–1.24)
Calcium: 8.9 mg/dL (ref 8.9–10.3)
GFR calc Af Amer: >60 mL/min (ref >60)
GFR calc non Af Amer: >60 mL/min (ref >60)
GLUCOSE: 156 mg/dL — AB (ref 70–99)
Potassium: 5.1 mmol/L (ref 3.5–5.1)
SODIUM: 137 mmol/L (ref 135–145)
Total Bilirubin: 0.7 mg/dL (ref 0.3–1.2)
Total Protein: 5.3 g/dL — ABNORMAL LOW (ref 6.5–8.1)

## 2018-07-18 LAB — GLUCOSE, CAPILLARY
GLUCOSE-CAPILLARY: 135 mg/dL — AB (ref 70–99)
GLUCOSE-CAPILLARY: 154 mg/dL — AB (ref 70–99)
GLUCOSE-CAPILLARY: 265 mg/dL — AB (ref 70–99)
Glucose-Capillary: 115 mg/dL — ABNORMAL HIGH (ref 70–99)
Glucose-Capillary: 240 mg/dL — ABNORMAL HIGH (ref 70–99)

## 2018-07-18 LAB — TYPE AND SCREEN
ABO/RH(D): O POS
Antibody Screen: NEGATIVE

## 2018-07-18 LAB — PROTIME-INR
INR: 1.24
PROTHROMBIN TIME: 15.5 s — AB (ref 11.4–15.2)

## 2018-07-18 LAB — ABO/RH: ABO/RH(D): O POS

## 2018-07-18 LAB — LIPASE, BLOOD: Lipase: 27 U/L (ref 11–51)

## 2018-07-18 MED ORDER — INSULIN ASPART 100 UNIT/ML ~~LOC~~ SOLN
0.0000 [IU] | Freq: Three times a day (TID) | SUBCUTANEOUS | Status: DC
  Administered 2018-07-19: 5 [IU] via SUBCUTANEOUS

## 2018-07-18 MED ORDER — INSULIN ASPART 100 UNIT/ML ~~LOC~~ SOLN
5.0000 [IU] | Freq: Once | SUBCUTANEOUS | Status: AC
  Administered 2018-07-18: 5 [IU] via SUBCUTANEOUS

## 2018-07-18 MED ORDER — TECHNETIUM TC 99M MEBROFENIN IV KIT
5.0000 | PACK | Freq: Once | INTRAVENOUS | Status: AC | PRN
  Administered 2018-07-18: 5 via INTRAVENOUS

## 2018-07-18 NOTE — Progress Notes (Addendum)
HIDA is negative. No indication for surgical intervention. OK to advance diet. Surgery will sign off. Please contact us with questions or concerns at any time.

## 2018-07-18 NOTE — Consult Note (Addendum)
Reason for Consult:  Possible cholecystitis Referring Physician: Kirby Crigler CC:  Lower back pain, difficulty walking, labored breathing, bilateral leg weakness  Brett [name] is an 67 y.o. male.    HPI: Pt presented to [facility] on 11/4 after tripping over O2 tubing, and falling onto his bottom.  He reports increased pain in his back, left lower quadrant, going into his neck.  He has chronic back pain, and is been told he needs additional back surgery.  He also has problems with abdominal pain chronically.  These are a little obscure, but occurs with or without p.o. intake.  His abdomen is very protuberant, which he says is baseline.  He also notes he has been gaining weight not losing weight.  His p.o. intake does not seem to be limited by any specific foods.  His back pain, leg weakness, were the issue that brought him to the ED.  Work-up at any [facility]:  He was afebrile and vital signs were stable.  Labs showed a sodium of 131, potassium 5.1, glucose of 166, creatinine 1.10, lipase of 22, AST of 295, ALT of 198, total bilirubin is 0.6.  BNP was slightly elevated at 393.  Troponin 0 0.14, 0.17, 0.14, 0.12.  WBC 13.1, hemoglobin 8.1, hematocrit 27.0, platelets 408,000.  Lumbar spine 4 view exam shows lumbar spine scoliosis with diffuse multi-level degenerative changes, stable from prior exam.  Chest x-ray shows increased right basilar opacity possible atelectasis or infiltrate, situs anomaly.   CT of the spine shows no acute changes, prior discectomy anterior fusion C3-C4, C4-C5.  Degenerative changes C3-C4, C4-C5, C7-T1. CT of the abdomen the pelvis without contrast shows no focal abnormality of the liver gallbladder was distended with pericholecystic edema, most prominent in the region of the gallbladder neck there is no intrahepatic or extrahepatic biliary dilatation.  There were no gallstones.   There is also edema and inflammation associated with the descending and proximal transverse segments of the  duodenum.  There is no bowel wall thickening and no bowel dilatation.  The appendix was normal.  Patient had situs inversus totalis.  Was radiology's impression that this could be possible acute cholecystitis, there is also possibility it could be a duodenitis. Abdominal ultrasound shows no gallstones or wall thickening visualized.  There was no Murphy sign, there was a small amount of pericholecystic fluid noted.  Common bile duct was 5 mm. Patient was transferred to [facility] for further evaluation and treatment and we are asked to see.  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  No further evaluation is been completed secondary to his COPD.  Chronic back pain.  Type 2 diabetes, hypertension, probable sleep apnea sleep apnea study as scheduled.  Peripheral neuropathy, chronic balance issues with multiple falls.  Past Medical History:  Diagnosis Date  . Depression   . Dextrocardia   . Diabetes mellitus   . H. pylori infection 11/09/2012   treated with pylera  . HTN (hypertension)    Cholesterol  . Iron deficiency anemia, unspecified 10/18/2012  . Neuropathy   . Pneumonia   . Situs inversus totalis   . Tachycardia     Past Surgical History:  Procedure Laterality Date  . CATARACT EXTRACTION, BILATERAL  2016  . COLONOSCOPY WITH ESOPHAGOGASTRODUODENOSCOPY (EGD) N/A 11/09/2012   Dr. Gala Romney- EGD-normal esophagus, reversed stomach c/w situs inversus (with dextrocardia query  kartagener syndrome.) gastric erosions. hpylori on bx- treated with pylera. TCS- normal rectum. 1 diminutive polyp in the mid descending segment. 1-63m polyp in the mid desending segment o/w the remainder of the colonic mucosa appeared normal. tubular adenoma on bx  . GIVENS CAPSULE STUDY N/A 10/07/2013   no source for anemia or heme positive stool  noted  . NECK SURGERY     bone spurs    Family History  Problem Relation Age of Onset  . Diabetes Sister        Fx  . Heart defect Sister        Fx  . Arthritis Sister        Fx  . Asthma Sister        Fx  . Kidney disease Sister        Fx  . Pancreatitis Sister     Social History:  reports that he has never smoked. His smokeless tobacco use includes chew. He reports that he does not drink alcohol or use drugs. She has been chewing tobacco since he was fairly young.  He continues to chew tobacco when not in the hospital.  Allergies: No Known Allergies  Medications:  Prior to Admission:  Medications Prior to Admission  Medication Sig Dispense Refill Last Dose  . albuterol (PROAIR HFA) 108 (90 Base) MCG/ACT inhaler Inhale 2 puffs into the lungs every 4 (four) hours as needed for wheezing or shortness of breath. 1 Inhaler 2 unknown at Unknown time  . aspirin EC 81 MG tablet Take 81 mg by mouth daily.   07/16/2018 at Unknown time  . budesonide-formoterol (SYMBICORT) 160-4.5 MCG/ACT inhaler Inhale 2 puffs into the lungs 2 (two) times daily. (Patient taking differently: Inhale 2 puffs into the lungs daily as needed (for shortness of breath). ) 1 Inhaler 5 Past Month at Unknown time  . DEXILANT 60 MG capsule TAKE (1) CAPSULE BY MOUTH EVERY DAY. (Patient taking differently: Take 60 mg by mouth daily. ) 90 capsule 3 07/16/2018 at Unknown time  . DULoxetine (CYMBALTA) 60 MG capsule Take 60 mg by mouth daily.   07/16/2018 at Unknown time  . fenofibrate (TRICOR) 145 MG tablet Take 145 mg by mouth every evening.    07/15/2018 at Unknown time  . folic acid (FOLVITE) 1 MG tablet Take 1 mg by mouth daily.   07/16/2018 at Unknown time  . furosemide (LASIX) 40 MG tablet Take 60 mg in the morning, take 40 mg in the evening. (Patient taking differently: Take 40-60 mg by mouth See admin instructions. Take 60 mg in the morning, take 40 mg in the evening.) 225 tablet 3 07/16/2018 at Unknown time  . gabapentin  (NEURONTIN) 600 MG tablet Take 1 tablet (600 mg total) by mouth 2 (two) times daily. (Patient taking differently: Take 600 mg by mouth 3 (three) times daily. ) 20 tablet 0 07/16/2018 at Unknown time  . insulin detemir (LEVEMIR) 100 UNIT/ML injection Inject 30-60 Units into the skin See admin instructions. 30 units in the morning and 60 units at bedtime   07/16/2018 at Unknown time  . losartan (COZAAR) 100 MG tablet Take 100 mg by mouth daily.   07/16/2018 at Unknown time  . Magnesium 400 MG TABS Take 400 mg by mouth daily.    07/16/2018 at Unknown time  . meloxicam (MOBIC) 7.5 MG tablet Take 2 tablets (15 mg total) by mouth daily as needed for pain. 7 tablet 0 07/16/2018 at Unknown time  . metFORMIN (GLUCOPHAGE) 1000 MG  tablet Take 1,000 mg by mouth 2 (two) times daily with a meal.   07/16/2018 at Unknown time  . metoprolol tartrate (LOPRESSOR) 25 MG tablet Take 0.5 tablets (12.5 mg total) by mouth 2 (two) times daily. 60 tablet 1 07/16/2018 at [place]  . Multiple Vitamin (MULTIVITAMIN) tablet Take 1 tablet by mouth every morning.    07/16/2018 at Unknown time  . oxyCODONE-acetaminophen (PERCOCET/ROXICET) 5-325 MG tablet Take 1 tablet by mouth every 4 (four) hours as needed for severe pain. 10 tablet 0 07/16/2018 at Unknown time  . OXYGEN Inhale 3-4 L into the lungs continuous.   07/16/2018 at Unknown time  . pioglitazone (ACTOS) 45 MG tablet Take 45 mg by mouth daily.   07/16/2018 at Unknown time  . rOPINIRole (REQUIP) 2 MG tablet Take 2 mg by mouth at bedtime.    07/15/2018 at Unknown time  . rosuvastatin (CRESTOR) 40 MG tablet Take 1 tablet by mouth every evening.   07/15/2018 at Unknown time  . sitaGLIPtin (JANUVIA) 100 MG tablet Take 100 mg by mouth daily.    07/16/2018 at Unknown time  . traZODone (DESYREL) 50 MG tablet Take 50 mg by mouth at bedtime.    07/15/2018 at Unknown time   Scheduled: . aspirin EC  81 mg Oral Daily  . DULoxetine  60 mg Oral Daily  . fenofibrate  160 mg Oral Daily  . ferrous sulfate   325 mg Oral BID WC  . folic acid  1 mg Oral Daily  . gabapentin  600 mg Oral TID  . losartan  100 mg Oral Daily  . magnesium oxide  400 mg Oral Daily  . metoprolol tartrate  12.5 mg Oral BID  . mometasone-formoterol  2 puff Inhalation BID  . multivitamin with minerals  1 tablet Oral q morning - 10a  . pantoprazole  40 mg Oral Daily  . rOPINIRole  2 mg Oral QHS  . traZODone  50 mg Oral QHS   Continuous: . ceFEPime (MAXIPIME) IV Stopped (07/18/18 0532)  . dextrose 5 % and 0.45% NaCl 75 mL/hr at 07/18/18 0557  . metronidazole 100 mL/hr at 07/18/18 0612   Anti-infectives (From admission, onward)   Start     Dose/Rate Route Frequency Ordered Stop   07/17/18 0800  vancomycin (VANCOCIN) IVPB 1000 mg/200 mL premix  Status:  Discontinued     1,000 mg 200 mL/hr over 60 Minutes Intravenous Every 12 hours 07/16/18 2024 07/16/18 2058   07/17/18 0300  ceFEPIme (MAXIPIME) 2 g in sodium chloride 0.9 % 100 mL IVPB     2 g 200 mL/hr over 30 Minutes Intravenous Every 8 hours 07/16/18 2024     07/16/18 2200  ceFEPIme (MAXIPIME) 1 g in sodium chloride 0.9 % 100 mL IVPB  Status:  Discontinued     1 g 200 mL/hr over 30 Minutes Intravenous Every 8 hours 07/16/18 2004 07/17/18 0422   07/16/18 2100  metroNIDAZOLE (FLAGYL) IVPB 500 mg     500 mg 100 mL/hr over 60 Minutes Intravenous Every 8 hours 07/16/18 2018     07/16/18 2030  vancomycin (VANCOCIN) 2,000 mg in sodium chloride 0.9 % 500 mL IVPB  Status:  Discontinued     2,000 mg 250 mL/hr over 120 Minutes Intravenous  Once 07/16/18 2024 07/16/18 2111      Results for orders placed or performed during the hospital encounter of 07/16/18 (from the past 48 hour(s))  Urinalysis, Routine w reflex microscopic     Status: None  Collection Time: 07/16/18  5:07 PM  Result Value Ref Range   Color, Urine YELLOW YELLOW   APPearance CLEAR CLEAR   Specific Gravity, Urine 1.010 1.005 - 1.030   pH 6.0 5.0 - 8.0   Glucose, UA NEGATIVE NEGATIVE mg/dL   Hgb urine  dipstick NEGATIVE NEGATIVE   Bilirubin Urine NEGATIVE NEGATIVE   Ketones, ur NEGATIVE NEGATIVE mg/dL   Protein, ur NEGATIVE NEGATIVE mg/dL   Nitrite NEGATIVE NEGATIVE   Leukocytes, UA NEGATIVE NEGATIVE    Comment: Microscopic not done on urines with negative protein, blood, leukocytes, nitrite, or glucose < 500 mg/dL. Performed at [facility], [address], [place], [place] [address]   Comprehensive metabolic panel     Status: Abnormal   Collection Time: 07/16/18  6:07 PM  Result Value Ref Range   Sodium 131 (L) 135 - 145 mmol/L   Potassium 5.1 3.5 - 5.1 mmol/L   Chloride 91 (L) 98 - 111 mmol/L   CO2 30 22 - 32 mmol/L   Glucose, Bld 166 (H) 70 - 99 mg/dL   BUN 19 8 - 23 mg/dL   Creatinine, Ser 1.10 0.61 - 1.24 mg/dL   Calcium 8.8 (L) 8.9 - 10.3 mg/dL   Total Protein 7.0 6.5 - 8.1 g/dL   Albumin 3.7 3.5 - 5.0 g/dL   AST 295 (H) 15 - 41 U/L   ALT 198 (H) 0 - 44 U/L   Alkaline Phosphatase 55 38 - 126 U/L   Total Bilirubin 0.6 0.3 - 1.2 mg/dL   GFR calc non Af Amer >60 >60 mL/min   GFR calc Af Amer >60 >60 mL/min    Comment: (NOTE) The eGFR has been calculated using the CKD EPI equation. This calculation has not been validated in all clinical situations. eGFR's persistently <60 mL/min signify possible Chronic Kidney Disease.    Anion gap 10 5 - 15    Comment: Performed at [facility], [address] [place], [place] [address]  Lipase, blood     Status: None   Collection Time: 07/16/18  6:07 PM  Result Value Ref Range   Lipase 22 11 - 51 U/L    Comment: Performed at [facility], [address] [place], [place] [address]  Brain natriuretic peptide     Status: Abnormal   Collection Time: 07/16/18  6:07 PM  Result Value Ref Range   B Natriuretic Peptide 393.0 (H) 0.0 - 100.0 pg/mL    Comment: Performed at [facility], [address], [place], [place] [address]  Troponin I     Status: Abnormal   Collection Time: 07/16/18  6:07 PM  Result Value Ref Range   Troponin I 0.14  (HH) <0.03 ng/mL    Comment: CRITICAL RESULT CALLED TO, READ BACK BY AND VERIFIED WITH: MOSHER,J ON 07/16/18 AT 1945 BY LOY,C Performed at [facility], [address], [place], [place] [address]   CBC with Differential     Status: Abnormal   Collection Time: 07/16/18  6:07 PM  Result Value Ref Range   WBC 13.1 (H) 4.0 - 10.5 K/uL   RBC 2.78 (L) 4.22 - 5.81 MIL/uL   Hemoglobin 8.1 (L) 13.0 - 17.0 g/dL   HCT 27.0 (L) 39.0 - 52.0 %   MCV 97.1 80.0 - 100.0 fL   MCH 29.1 26.0 - 34.0 pg   MCHC 30.0 30.0 - 36.0 g/dL   RDW 14.6 11.5 - 15.5 %   Platelets 408 (H) 150 - 400 K/uL   nRBC  0.2 0.0 - 0.2 %   Neutrophils Relative % 86 %   Neutro Abs 11.2 (H) 1.7 - 7.7 K/uL   Lymphocytes Relative 7 %   Lymphs Abs 0.9 0.7 - 4.0 K/uL   Monocytes Relative 7 %   Monocytes Absolute 0.9 0.1 - 1.0 K/uL   Eosinophils Relative 0 %   Eosinophils Absolute 0.0 0.0 - 0.5 K/uL   Basophils Relative 0 %   Basophils Absolute 0.0 0.0 - 0.1 K/uL   Immature Granulocytes 0 %   Abs Immature Granulocytes 0.05 0.00 - 0.07 K/uL    Comment: Performed at [facility], [address], [place], [place] [address]  Protime-INR     Status: Abnormal   Collection Time: 07/16/18  6:07 PM  Result Value Ref Range   Prothrombin Time 15.4 (H) 11.4 - 15.2 seconds   INR 1.23     Comment: Performed at [facility], [address] [place], [place] [address]  Magnesium     Status: None   Collection Time: 07/16/18  6:07 PM  Result Value Ref Range   Magnesium 1.9 1.7 - 2.4 mg/dL    Comment: Performed at [facility], [address], [place], [place] [address]  Culture, blood (routine x 2) Call MD if unable to obtain prior to antibiotics being given     Status: None (Preliminary result)   Collection Time: 07/16/18  8:22 PM  Result Value Ref Range   Specimen Description BLOOD LEFT ARM    Special Requests      BOTTLES DRAWN AEROBIC AND ANAEROBIC Blood Culture adequate volume   Culture      NO GROWTH < 12 HOURS Performed at [facility], [address] [place], [place] [address]    Report Status PENDING   Culture, blood (routine x 2) Call MD if unable to obtain prior to antibiotics being given     Status: None (Preliminary result)   Collection Time: 07/16/18  8:25 PM  Result Value Ref Range   Specimen Description BLOOD LEFT HAND    Special Requests      BOTTLES DRAWN AEROBIC AND ANAEROBIC Blood Culture adequate volume   Culture      NO GROWTH < 12 HOURS Performed at [facility], [address], [place], [place] [address]    Report Status PENDING   MRSA PCR Screening     Status: None   Collection Time: 07/16/18  8:44 PM  Result Value Ref Range   MRSA by PCR NEGATIVE NEGATIVE    Comment:        The GeneXpert MRSA Assay (FDA approved for NASAL specimens only), is one component of a comprehensive MRSA colonization surveillance program. It is not intended to diagnose MRSA infection nor to guide or monitor treatment for MRSA infections. Performed at [facility], [address], [place], [place] [address]   CBG monitoring, ED     Status: Abnormal   Collection Time: 07/16/18  8:47 PM  Result Value Ref Range   Glucose-Capillary 148 (H) 70 - 99 mg/dL  Troponin I (q 6hr x 3)     Status: Abnormal   Collection Time: 07/16/18  8:58 PM  Result Value Ref Range   Troponin I 0.17 (HH) <0.03 ng/mL    Comment: CRITICAL VALUE NOTED.  VALUE IS CONSISTENT WITH PREVIOUSLY REPORTED AND CALLED VALUE. Performed at [facility], [address] [place] [address] [place], [place] [address]   CBG monitoring, ED     Status: Abnormal   Collection Time: 07/17/18  2:37 AM  Result Value Ref Range   Glucose-Capillary 107 (H) 70 - 99 mg/dL  Troponin I (q 6hr x 3)     Status: Abnormal   Collection Time: 07/17/18  3:14 AM  Result Value Ref Range   Troponin I 0.14 (HH) <0.03 ng/mL    Comment: CRITICAL VALUE NOTED.  VALUE IS CONSISTENT WITH PREVIOUSLY REPORTED AND CALLED VALUE. Performed at [facility], [address], [place], [place] [address]   CBC      Status: Abnormal   Collection Time: 07/17/18  3:14 AM  Result Value Ref Range   WBC 11.3 (H) 4.0 - 10.5 K/uL   RBC 2.68 (L) 4.22 - 5.81 MIL/uL   Hemoglobin 7.5 (L) 13.0 - 17.0 g/dL   HCT 25.8 (L) 39.0 - 52.0 %   MCV 96.3 80.0 - 100.0 fL   MCH 28.0 26.0 - 34.0 pg   MCHC 29.1 (L) 30.0 - 36.0 g/dL   RDW 14.8 11.5 - 15.5 %   Platelets 395 150 - 400 K/uL   nRBC 0.4 (H) 0.0 - 0.2 %    Comment: Performed at [facility], [address], [place], [place] [address]  Comprehensive metabolic panel     Status: Abnormal   Collection Time: 07/17/18  3:14 AM  Result Value Ref Range   Sodium 134 (L) 135 - 145 mmol/L   Potassium 4.6 3.5 - 5.1 mmol/L   Chloride 95 (L) 98 - 111 mmol/L   CO2 31 22 - 32 mmol/L   Glucose, Bld 71 70 - 99 mg/dL   BUN 16 8 - 23 mg/dL   Creatinine, Ser 0.95 0.61 - 1.24 mg/dL   Calcium 9.1 8.9 - 10.3 mg/dL   Total Protein 6.8 6.5 - 8.1 g/dL   Albumin 3.5 3.5 - 5.0 g/dL   AST 385 (H) 15 - 41 U/L   ALT 249 (H) 0 - 44 U/L   Alkaline Phosphatase 55 38 - 126 U/L   Total Bilirubin 0.5 0.3 - 1.2 mg/dL   GFR calc non Af Amer >60 >60 mL/min   GFR calc Af Amer >60 >60 mL/min    Comment: (NOTE) The eGFR has been calculated using the CKD EPI equation. This calculation has not been validated in all clinical situations. eGFR's persistently <60 mL/min signify possible Chronic Kidney Disease.    Anion gap 8 5 - 15    Comment: Performed at [facility], [address], [place], [place] [address]  CBG monitoring, ED     Status: Abnormal   Collection Time: 07/17/18  6:33 AM  Result Value Ref Range   Glucose-Capillary 30 (LL) 70 - 99 mg/dL  POC CBG, ED     Status: None   Collection Time: 07/17/18  7:34 AM  Result Value Ref Range   Glucose-Capillary 82 70 - 99 mg/dL  Troponin I (q 6hr x 3)     Status: Abnormal   Collection Time: 07/17/18  9:31 AM  Result Value Ref Range   Troponin I 0.12 (HH) <0.03 ng/mL    Comment: CRITICAL VALUE NOTED.  VALUE IS CONSISTENT WITH PREVIOUSLY  REPORTED AND CALLED VALUE. Performed at [facility], [address] [place], [place] [address]   Type and screen [facility]     Status: None   Collection Time: 07/17/18  9:31 AM  Result Value Ref Range   ABO/RH(D) O POS    Antibody Screen NEG    Sample Expiration      07/20/2018 Performed at  [address] [place], [place] [address]   CBG monitoring, ED     Status: Abnormal   Collection Time: 07/17/18 12:15 PM  Result Value Ref Range   Glucose-Capillary 46 (L) 70 - 99 mg/dL  CBG monitoring, ED     Status: Abnormal   Collection Time: 07/17/18  2:15 PM  Result Value Ref Range   Glucose-Capillary 118 (H) 70 - 99 mg/dL  CBG monitoring, ED     Status: None   Collection Time: 07/17/18  4:17 PM  Result Value Ref Range   Glucose-Capillary 92 70 - 99 mg/dL  Glucose, capillary     Status: None   Collection Time: 07/17/18  9:46 PM  Result Value Ref Range   Glucose-Capillary 81 70 - 99 mg/dL  Glucose, capillary     Status: Abnormal   Collection Time: 07/18/18 12:53 AM  Result Value Ref Range   Glucose-Capillary 115 (H) 70 - 99 mg/dL  Glucose, capillary     Status: Abnormal   Collection Time: 07/18/18  4:04 AM  Result Value Ref Range   Glucose-Capillary 135 (H) 70 - 99 mg/dL    Ct Abdomen Pelvis Wo Contrast  Result Date: 07/16/2018 CLINICAL DATA:  Fall 4 days ago with left-sided pain. EXAM: CT ABDOMEN AND PELVIS WITHOUT CONTRAST TECHNIQUE: Multidetector CT imaging of the abdomen and pelvis was performed following the standard protocol without IV contrast. COMPARISON:  04/19/2014 FINDINGS: Lower chest: Heart size upper normal. Patchy ground-glass attenuation noted in the lower lobes bilaterally with chronic volume loss in the right lower lobe, similar to prior. Of note, this patient has situs inversus totalis. Hepatobiliary: No focal abnormality in the liver on this study without intravenous contrast. Gallbladder is distended with pericholecystic edema, most  prominent in the region of the gallbladder neck. No intrahepatic or extrahepatic biliary dilation. Pancreas: No focal mass lesion. No dilatation of the main duct. No intraparenchymal cyst. No peripancreatic edema. Spleen: No splenomegaly. No focal mass lesion. Adrenals/Urinary Tract: No adrenal nodule or mass. Kidneys unremarkable. No evidence for hydroureter. The urinary bladder appears normal for the degree of distention. Stomach/Bowel: Stomach is nondistended. No gastric wall thickening. No evidence of outlet obstruction. Duodenum is normally positioned as is the ligament of Treitz. There is edema/inflammation associated with the descending and proximal transverse segments of the duodenum. No small bowel wall thickening. No small bowel dilatation. The terminal ileum is normal. The appendix is normal. No gross colonic mass. No colonic wall thickening. No substantial diverticular change. Vascular/Lymphatic: No abdominal aortic aneurysm. There is no gastrohepatic or hepatoduodenal ligament lymphadenopathy. No intraperitoneal or retroperitoneal lymphadenopathy. Upper normal portal caval lymph node is stable in the interval. No pelvic sidewall lymphadenopathy. Reproductive: The prostate gland and seminal vesicles have normal imaging features. Other: Trace fluid is noted adjacent to the liver. Edema is noted within the retroperitoneal tissues tracking inferiorly on the left. Musculoskeletal: No worrisome lytic or sclerotic osseous abnormality. IMPRESSION: 1. Situs inversus totalis. The patient has edema/inflammation around the gallbladder, extending into the gallbladder fossa and hepatoduodenal ligament seen tracking around the descending duodenum and then down the left retroperitoneal space towards the pelvis. Given the situs inversus, these changes are in the left abdomen. Although no gallstones are evident by CT, acute cholecystitis would be a consideration. Epicenter of edema/inflammation arising from the duodenum  is also consideration and duodenitis should be considered. No substantial edema in the head of the pancreas to suggest groove pancreatitis. Electronically Signed   By: Misty Stanley  [name]   On: 07/16/2018 19:48   Dg Chest 2 View  Result Date: 07/16/2018 CLINICAL DATA:  Abdominal pain EXAM: CHEST - 2 VIEW COMPARISON:  04/21/2018, CT chest 06/08/2018 FINDINGS: Postsurgical changes in the cervical spine. Increased opacity at the right base. No pleural effusion. No pneumothorax. Stable enlarged cardiomediastinal silhouette with right arch and right-sided cardiac apex. Situs inversus as noted on multiple prior studies. IMPRESSION: 1. Increased right basilar opacity may reflect atelectasis or infiltrate. 2. Situs anomaly as noted on previous exams. Stable enlarged cardiomediastinal silhouette with right arch and right cardiac apex. Electronically Signed   By: Donavan Foil [name]   On: 07/16/2018 19:06   Dg Lumbar Spine 2-3 Views  Result Date: 07/16/2018 CLINICAL DATA:  Chronic low back pain radiating down the left leg for several years. EXAM: LUMBAR SPINE - 2-3 VIEW COMPARISON:  Lumbar spine radiographs 07/13/2018. CT abdomen and pelvis 07/16/2018. Lumbar spine MRI 06/18/2018 FINDINGS: There are 5 non rib-bearing lumbar type vertebrae. There is mild lumbar dextroscoliosis. There is no listhesis. Mild superior endplate compression deformities at L1 and L2 are new from last month's MRI and are without retropulsion. There is mild lower thoracic and lumbar spondylosis, unchanged. IMPRESSION: Mild L1 and L2 compression fractures, new from a 06/18/2018 MRI. Electronically Signed   By: Logan Bores [name]   On: 07/16/2018 21:48   Ct Cervical Spine Wo Contrast  Result Date: 07/16/2018 CLINICAL DATA:  Fall with neck pain EXAM: CT CERVICAL SPINE WITHOUT CONTRAST TECHNIQUE: Multidetector CT imaging of the cervical spine was performed without intravenous contrast. Multiplanar CT image reconstructions were also generated.  COMPARISON:  X-rays from 12/13/2010 FINDINGS: Alignment: Straightening of normal cervical lordosis evident. Skull base and vertebrae: No acute fracture. No primary bone lesion or focal pathologic process. Soft tissues and spinal canal: No prevertebral fluid or swelling. No visible canal hematoma. Disc levels: Loss of disc height noted at C3-4 and C4-5 with endplate spurring. Patient is status post anterior cervical discectomy with plate at C 5 6 and P9-4. Solid bony fusion noted across the C5-6 and C6-7 disc space. Loss of disc height noted at C7-T1. Upper chest: Irregular pleuroparenchymal opacity identified posterior left lung apex. This is similar to diagnostic chest CT of 04/22/2018 and may reflect scarring. Other: None. IMPRESSION: 1. No acute bony abnormality. 2. Discectomy with anterior fusion at C3-4 and C4-5. 3. Degenerative changes at C3-4, C4-5, and C7-T1. Electronically Signed   By: Misty Stanley [name]   On: 07/16/2018 20:08   US Abdomen Limited  Result Date: 07/17/2018 CLINICAL DATA:  History of situs inversus.  Nausea. EXAM: ULTRASOUND ABDOMEN LIMITED left UPPER QUADRANT COMPARISON:  CT scan of July 16, 2018. FINDINGS: Gallbladder: No gallstones or wall thickening visualized. No sonographic Murphy sign noted by sonographer. Small amount of pericholecystic fluid is noted. Common bile duct: Diameter: 5 mm which is within normal limits. Liver: No focal lesion identified. Within normal limits in parenchymal echogenicity. Portal vein is patent on color Doppler imaging with normal direction of blood flow towards the liver. IMPRESSION: Situs inversus is noted. Small amount of pericholecystic fluid is noted around the gallbladder, although no gallbladder wall thickening or cholelithiasis is noted. No biliary dilatation is noted. Liver is unremarkable. Electronically Signed   By: Marijo Conception, [name]   On: 07/17/2018 08:24    Review of Systems  Constitutional: Negative for chills, diaphoresis, fever,  malaise/fatigue and weight loss.  HENT:       His wife notes his voice  is changed and is becoming more hoarse and she has difficulty understanding him frequently.  This is new.  Eyes: Negative.   Respiratory: Positive for cough, hemoptysis, sputum production, shortness of breath (He describes dyspnea on exertion, essentially walking across the room.) and wheezing.   Cardiovascular: Positive for chest pain, orthopnea, leg swelling and PND.  Gastrointestinal: Positive for abdominal pain (He has some chronic discomfort.), heartburn (He is on a PPI.) and nausea. Negative for blood in stool, constipation, diarrhea, melena and vomiting.       He has had a colonoscopy in [place] by Dr. Gala Romney  Genitourinary:       He describes some BPH symptoms but otherwise negative.  Musculoskeletal: Positive for back pain, falls, joint pain, myalgias and neck pain.       He complains of leg weakness and falling also neuropathy, with balance issues.  Skin: Negative.   Neurological:       He describes vertigo-like symptoms, neuropathy in his lower extremities and trouble with his gait.  He has had a number of falls.  Endo/Heme/Allergies: Positive for polydipsia.  Psychiatric/Behavioral: Positive for depression. Negative for hallucinations, memory loss, substance abuse and suicidal ideas. The patient is nervous/anxious. The patient does not have insomnia.    Blood pressure (!) 146/53, pulse 92, temperature (!) 97.3 F (36.3 C), temperature source Oral, resp. rate (!) 22, height '5\' 6"'  (1.676 m), weight 122.5 kg, SpO2 (!) 89 %. Physical Exam  Constitutional: He is oriented to person, place, and time.  This is a chronically ill man sitting up in the chair.  He is dyspneic just with talking.  He has coarse breath sounds he can hear when you come in.  His voice is quite hoarse and at times he is difficult to understand.  His wife complained of this also, and said this is new.  HENT:  Head: Normocephalic and atraumatic.   Mouth/Throat: Oropharynx is clear and moist. No oropharyngeal exudate.  There is a small scar on his right forehead, well-healed  Eyes: Right eye exhibits no discharge. Left eye exhibits no discharge. No scleral icterus.  Pupils are equal  Neck: Normal range of motion. Neck supple. No JVD present. No tracheal deviation present. No thyromegaly present.  Cardiovascular: Normal rate, regular rhythm, normal heart sounds and intact distal pulses.  No murmur heard. Respiratory: No respiratory distress (This seems to be his baseline.  His family says his lungs are never completely clear.). He has wheezes. He has rales. He exhibits no tenderness.  Patient appears somewhat dyspneic sitting in the chair talking on oxygen by nasal cannula.  GI: He exhibits distension. He exhibits no mass. There is tenderness (He is tender on both the left and the right sides on palpation, no one side is more tender than the other.). There is no rebound and no guarding.  Patient has a very large rotund abdomen he is distended and just looks uncomfortable.  This sounds like this is his baseline, he did not think it was especially different than before.  Musculoskeletal: He exhibits edema (Trace trace edema in his ankles.).  Lymphadenopathy:    He has no cervical adenopathy.  Neurological: He is alert and oriented to person, place, and time. No cranial nerve deficit.  Skin: Skin is warm and dry. No rash noted. No erythema. No pallor.  Psychiatric: He has a normal mood and affect. His behavior is normal. Judgment and thought content normal.    Assessment/Plan: Abdominal and back pain. Chronic back pain Elevated  LFTs Pericholecystic edema/duodenitis Situs inversus Probable CAD with positive CT coronary fractional flow study/probable LAD disease Elevated troponin's Chronic diastolic congestive heart failure Severe COPD on oxygen therapy right basilar opacity/possible atelectasis or infiltrate Probable obstructive sleep  apnea Type 2 diabetes with neuropathy Chronic anemia BMI 43.58  Plan: He is afebrile and his LFTs are actually improving.  His abdominal complaints are not really consistent with cholecystitis.  He has no gallstones on ultrasound or CT.  WBC is also improving, he has multiple issues which could elevate his WBC.  We have ordered a HIDA scan and will follow with you.  He is a very poor surgical candidate, and if positive we would most likely recommend IR drain placement.  He will need pulmonary and cardiology clearance before any surgical procedure.  Alexee Delsanto 07/18/2018, 7:02 AM

## 2018-07-18 NOTE — Progress Notes (Signed)
PROGRESS NOTE    Brett [name]  ONG:[id number] DOB: Feb 03, 1951 DOA: 07/16/2018 PCP: [name], MD    Brief Narrative:  67 y.o. 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  Patient presents to the ED with c/o low back pain following a fall at home.  He tripped over his oxygen tubing and lost his balance. He fell backwards onto his bottom back. He said increased pain since that time. Tolerable at rest. Sniffily worse in sitting up or trying to ambulate. He is having a hard time walking because of the pain but does not really feel like his legs are weak. No acute numbness or tingling.  Fall occurred on halloween night.  Additionally has LUQ pain, radiates to neck.   ED Course: AST 295 ALT 198, trop 0.14 (looks like baseline 0.2-0.3)  ? Atelectasis vs infiltrate RLL, CT reveals cholecystitis.  HGB 8.1.  Assessment & Plan:   Principal Problem:   Acute cholecystitis Active Problems:   Iron deficiency anemia due to chronic blood loss   Insulin-requiring or dependent type II diabetes mellitus (HCC)   Chronic diastolic CHF (congestive heart failure) (HCC)   COPD (chronic obstructive pulmonary disease) (HCC)   Situs inversus totalis   Chronic respiratory failure with hypoxia (HCC)   HTN (hypertension)   1. Acute cholecystitis ruled out with situs inversus totalis - 1. Continued on empiric Cefepime and flagyl 2. Repeat CMP with improvement in LFT's 3. General Surgery following 4. Patient underwent HIDA which was found to be unremarkable study 5. No on clears 6. On review of CT abd, stool noted throughout most of colon  2. COPD - 1. At baseline 2. Continue on 3L O2 at baseline 3. Cont home nebs 3. Chronic Diastolic CHF - 1. Lasix on hold at time of admit 2. Continue other home meds 4. HTN - continue home meds as tolerated 5. Iron def anemia due to chronic blood loss - 1. Will start PO iron since  HGB trending down over past few months. 2. Repeat CBC in AM 6. DM2 - 1. Hold home PO meds 2. Levemir 30 BID (takes 30 and 60 at home) 3. Mod scale SSI Q4H 4. Continue to titrate insulin as needed 7. RLL infiltrate vs atelectasis - 1. probably atelectasis 2. Stable at present 3. Will hold off on empiric HCAP treatment for now. 8. Constipation 1. Stool noted on CT per my own read per above 2. Will give trial of bowel regimen/cathartics   DVT prophylaxis: SCD's Code Status: Full Family Communication: Pt in room, family at bedside Disposition Plan: Uncertain at this time  Consultants:   General Surgery  Procedures:     Antimicrobials: Anti-infectives (From admission, onward)   Start     Dose/Rate Route Frequency Ordered Stop   07/17/18 0800  vancomycin (VANCOCIN) IVPB 1000 mg/200 mL premix  Status:  Discontinued     1,000 mg 200 mL/hr over 60 Minutes Intravenous Every 12 hours 07/16/18 2024 07/16/18 2058   07/17/18 0300  ceFEPIme (MAXIPIME) 2 g in sodium chloride 0.9 % 100 mL IVPB     2 g 200 mL/hr over 30 Minutes Intravenous Every 8 hours 07/16/18 2024     07/16/18 2200  ceFEPIme (MAXIPIME) 1 g in sodium chloride 0.9 % 100 mL IVPB  Status:  Discontinued     1 g 200 mL/hr over 30 Minutes Intravenous Every 8 hours 07/16/18 2004 07/17/18 0422   07/16/18  2100  metroNIDAZOLE (FLAGYL) IVPB 500 mg     500 mg 100 mL/hr over 60 Minutes Intravenous Every 8 hours 07/16/18 2018     07/16/18 2030  vancomycin (VANCOCIN) 2,000 mg in sodium chloride 0.9 % 500 mL IVPB  Status:  Discontinued     2,000 mg 250 mL/hr over 120 Minutes Intravenous  Once 07/16/18 2024 07/16/18 2111       Subjective: Still reporting abd discomfort  Objective: Vitals:   07/17/18 1730 07/17/18 1901 07/17/18 2148 07/18/18 0638  BP: 132/61 135/61 137/65 (!) 146/53  Pulse: 67 66 66 92  Resp: 20 (!) 22 (!) 22   Temp: 98 F (36.7 C) 98 F (36.7 C) 98.2 F (36.8 C) (!) 97.3 F (36.3 C)  TempSrc: Oral  Oral Oral Oral  SpO2: 98% 95% 97% (!) 89%  Weight:      Height:        Intake/Output Summary (Last 24 hours) at 07/18/2018 1446 Last data filed at 07/18/2018 0612 Gross per 24 hour  Intake 1528.49 ml  Output 200 ml  Net 1328.49 ml   Filed Weights   07/16/18 1556  Weight: 122.5 kg    Examination:  General exam: Appears calm and comfortable  Respiratory system: Clear to auscultation. Respiratory effort normal. Cardiovascular system: S1 & S2 heard, RRR Gastrointestinal system: Obese, decreased BS, generally tender Central nervous system: Alert and oriented. No focal neurological deficits. Extremities: Symmetric 5 x 5 power. Skin: No rashes, lesions  Psychiatry: Judgement and insight appear normal. Mood & affect appropriate.   Data Reviewed: I have personally reviewed following labs and imaging studies  CBC: Recent Labs  Lab 07/16/18 1807 07/17/18 0314 07/18/18 0727  WBC 13.1* 11.3* 11.8*  NEUTROABS 11.2*  --   --   HGB 8.1* 7.5* 7.8*  HCT 27.0* 25.8* 26.8*  MCV 97.1 96.3 96.4  PLT 408* 395 941*   Basic Metabolic Panel: Recent Labs  Lab 07/16/18 1807 07/17/18 0314 07/18/18 0727  NA 131* 134* 137  K 5.1 4.6 5.1  CL 91* 95* 97*  CO2 30 31 32  GLUCOSE 166* 71 156*  BUN 19 16 12   CREATININE 1.10 0.95 0.94  CALCIUM 8.8* 9.1 8.9  MG 1.9  --   --    GFR: Estimated Creatinine Clearance: 96.7 mL/min (by C-G formula based on SCr of 0.94 mg/dL). Liver Function Tests: Recent Labs  Lab 07/16/18 1807 07/17/18 0314 07/18/18 0727  AST 295* 385* 170*  ALT 198* 249* 206*  ALKPHOS 55 55 59  BILITOT 0.6 0.5 0.7  PROT 7.0 6.8 5.3*  ALBUMIN 3.7 3.5 2.9*   Recent Labs  Lab 07/16/18 1807 07/18/18 0727  LIPASE 22 27   No results for input(s): AMMONIA in the last 168 hours. Coagulation Profile: Recent Labs  Lab 07/16/18 1807 07/18/18 0727  INR 1.23 1.24   Cardiac Enzymes: Recent Labs  Lab 07/16/18 1807 07/16/18 2058 07/17/18 0314 07/17/18 0931    TROPONINI 0.14* 0.17* 0.14* 0.12*   BNP (last 3 results) No results for input(s): PROBNP in the last 8760 hours. HbA1C: No results for input(s): HGBA1C in the last 72 hours. CBG: Recent Labs  Lab 07/17/18 1617 07/17/18 2146 07/18/18 0053 07/18/18 0404 07/18/18 1202  GLUCAP 92 81 115* 135* 154*   Lipid Profile: No results for input(s): CHOL, HDL, LDLCALC, TRIG, CHOLHDL, LDLDIRECT in the last 72 hours. Thyroid Function Tests: No results for input(s): TSH, T4TOTAL, FREET4, T3FREE, THYROIDAB in the last 72 hours. Anemia Panel: No  results for input(s): VITAMINB12, FOLATE, FERRITIN, TIBC, IRON, RETICCTPCT in the last 72 hours. Sepsis Labs: No results for input(s): PROCALCITON, LATICACIDVEN in the last 168 hours.  Recent Results (from the past 240 hour(s))  Urine culture     Status: None   Collection Time: 07/16/18  5:07 PM  Result Value Ref Range Status   Specimen Description   Final    URINE, RANDOM Performed at [facility], [address], [place], [place] [address]    Special Requests   Final    NONE Performed at [facility], [address], [place], [place] [address]    Culture   Final    NO GROWTH Performed at [facility], [place] [address] [place], [place] [address]    Report Status 07/18/2018 FINAL  Final  Culture, blood (routine x 2) Call MD if unable to obtain prior to antibiotics being given     Status: None (Preliminary result)   Collection Time: 07/16/18  8:22 PM  Result Value Ref Range Status   Specimen Description BLOOD LEFT ARM  Final   Special Requests   Final    BOTTLES DRAWN AEROBIC AND ANAEROBIC Blood Culture adequate volume   Culture   Final    NO GROWTH 2 DAYS Performed at [facility], [address] [place], [place] [address]    Report Status PENDING  Incomplete  Culture, blood (routine x 2) Call MD if unable to obtain prior to antibiotics being given     Status: None (Preliminary result)   Collection Time: 07/16/18  8:25 PM  Result Value  Ref Range Status   Specimen Description BLOOD LEFT HAND  Final   Special Requests   Final    BOTTLES DRAWN AEROBIC AND ANAEROBIC Blood Culture adequate volume   Culture   Final    NO GROWTH 2 DAYS Performed at [facility], [address] [place], [place] [address]    Report Status PENDING  Incomplete  MRSA PCR Screening     Status: None   Collection Time: 07/16/18  8:44 PM  Result Value Ref Range Status   MRSA by PCR NEGATIVE NEGATIVE Final    Comment:        The GeneXpert MRSA Assay (FDA approved for NASAL specimens only), is one component of a comprehensive MRSA colonization surveillance program. It is not intended to diagnose MRSA infection nor to guide or monitor treatment for MRSA infections. Performed at [facility], [address] [place], [place] [address]      Radiology Studies: Ct Abdomen Pelvis Wo Contrast  Result Date: 07/16/2018 CLINICAL DATA:  Fall 4 days ago with left-sided pain. EXAM: CT ABDOMEN AND PELVIS WITHOUT CONTRAST TECHNIQUE: Multidetector CT imaging of the abdomen and pelvis was performed following the standard protocol without IV contrast. COMPARISON:  04/19/2014 FINDINGS: Lower chest: Heart size upper normal. Patchy ground-glass attenuation noted in the lower lobes bilaterally with chronic volume loss in the right lower lobe, similar to prior. Of note, this patient has situs inversus totalis. Hepatobiliary: No focal abnormality in the liver on this study without intravenous contrast. Gallbladder is distended with pericholecystic edema, most prominent in the region of the gallbladder neck. No intrahepatic or extrahepatic biliary dilation. Pancreas: No focal mass lesion. No dilatation of the main duct. No intraparenchymal cyst. No peripancreatic edema. Spleen: No splenomegaly. No focal mass lesion. Adrenals/Urinary Tract: No adrenal nodule or mass. Kidneys unremarkable. No evidence for hydroureter. The urinary bladder appears normal for the degree of  distention.  Stomach/Bowel: Stomach is nondistended. No gastric wall thickening. No evidence of outlet obstruction. Duodenum is normally positioned as is the ligament of Treitz. There is edema/inflammation associated with the descending and proximal transverse segments of the duodenum. No small bowel wall thickening. No small bowel dilatation. The terminal ileum is normal. The appendix is normal. No gross colonic mass. No colonic wall thickening. No substantial diverticular change. Vascular/Lymphatic: No abdominal aortic aneurysm. There is no gastrohepatic or hepatoduodenal ligament lymphadenopathy. No intraperitoneal or retroperitoneal lymphadenopathy. Upper normal portal caval lymph node is stable in the interval. No pelvic sidewall lymphadenopathy. Reproductive: The prostate gland and seminal vesicles have normal imaging features. Other: Trace fluid is noted adjacent to the liver. Edema is noted within the retroperitoneal tissues tracking inferiorly on the left. Musculoskeletal: No worrisome lytic or sclerotic osseous abnormality. IMPRESSION: 1. Situs inversus totalis. The patient has edema/inflammation around the gallbladder, extending into the gallbladder fossa and hepatoduodenal ligament seen tracking around the descending duodenum and then down the left retroperitoneal space towards the pelvis. Given the situs inversus, these changes are in the left abdomen. Although no gallstones are evident by CT, acute cholecystitis would be a consideration. Epicenter of edema/inflammation arising from the duodenum is also consideration and duodenitis should be considered. No substantial edema in the head of the pancreas to suggest groove pancreatitis. Electronically Signed   By: Misty Stanley [name]   On: 07/16/2018 19:48   Dg Chest 2 View  Result Date: 07/16/2018 CLINICAL DATA:  Abdominal pain EXAM: CHEST - 2 VIEW COMPARISON:  04/21/2018, CT chest 06/08/2018 FINDINGS: Postsurgical changes in the cervical spine.  Increased opacity at the right base. No pleural effusion. No pneumothorax. Stable enlarged cardiomediastinal silhouette with right arch and right-sided cardiac apex. Situs inversus as noted on multiple prior studies. IMPRESSION: 1. Increased right basilar opacity may reflect atelectasis or infiltrate. 2. Situs anomaly as noted on previous exams. Stable enlarged cardiomediastinal silhouette with right arch and right cardiac apex. Electronically Signed   By: Donavan Foil [name]   On: 07/16/2018 19:06   Dg Lumbar Spine 2-3 Views  Result Date: 07/16/2018 CLINICAL DATA:  Chronic low back pain radiating down the left leg for several years. EXAM: LUMBAR SPINE - 2-3 VIEW COMPARISON:  Lumbar spine radiographs 07/13/2018. CT abdomen and pelvis 07/16/2018. Lumbar spine MRI 06/18/2018 FINDINGS: There are 5 non rib-bearing lumbar type vertebrae. There is mild lumbar dextroscoliosis. There is no listhesis. Mild superior endplate compression deformities at L1 and L2 are new from last month's MRI and are without retropulsion. There is mild lower thoracic and lumbar spondylosis, unchanged. IMPRESSION: Mild L1 and L2 compression fractures, new from a 06/18/2018 MRI. Electronically Signed   By: Logan Bores [name]   On: 07/16/2018 21:48   Ct Cervical Spine Wo Contrast  Result Date: 07/16/2018 CLINICAL DATA:  Fall with neck pain EXAM: CT CERVICAL SPINE WITHOUT CONTRAST TECHNIQUE: Multidetector CT imaging of the cervical spine was performed without intravenous contrast. Multiplanar CT image reconstructions were also generated. COMPARISON:  X-rays from 12/13/2010 FINDINGS: Alignment: Straightening of normal cervical lordosis evident. Skull base and vertebrae: No acute fracture. No primary bone lesion or focal pathologic process. Soft tissues and spinal canal: No prevertebral fluid or swelling. No visible canal hematoma. Disc levels: Loss of disc height noted at C3-4 and C4-5 with endplate spurring. Patient is status post anterior  cervical discectomy with plate at C 5 6 and U5-4. Solid bony fusion noted across the C5-6 and C6-7 disc space. Loss of disc height  noted at C7-T1. Upper chest: Irregular pleuroparenchymal opacity identified posterior left lung apex. This is similar to diagnostic chest CT of 04/22/2018 and may reflect scarring. Other: None. IMPRESSION: 1. No acute bony abnormality. 2. Discectomy with anterior fusion at C3-4 and C4-5. 3. Degenerative changes at C3-4, C4-5, and C7-T1. Electronically Signed   By: Misty Stanley [name]   On: 07/16/2018 20:08   US Abdomen Limited  Result Date: 07/17/2018 CLINICAL DATA:  History of situs inversus.  Nausea. EXAM: ULTRASOUND ABDOMEN LIMITED left UPPER QUADRANT COMPARISON:  CT scan of July 16, 2018. FINDINGS: Gallbladder: No gallstones or wall thickening visualized. No sonographic Murphy sign noted by sonographer. Small amount of pericholecystic fluid is noted. Common bile duct: Diameter: 5 mm which is within normal limits. Liver: No focal lesion identified. Within normal limits in parenchymal echogenicity. Portal vein is patent on color Doppler imaging with normal direction of blood flow towards the liver. IMPRESSION: Situs inversus is noted. Small amount of pericholecystic fluid is noted around the gallbladder, although no gallbladder wall thickening or cholelithiasis is noted. No biliary dilatation is noted. Liver is unremarkable. Electronically Signed   By: Marijo Conception, [name]   On: 07/17/2018 08:24    Scheduled Meds: . aspirin EC  81 mg Oral Daily  . DULoxetine  60 mg Oral Daily  . fenofibrate  160 mg Oral Daily  . ferrous sulfate  325 mg Oral BID WC  . folic acid  1 mg Oral Daily  . gabapentin  600 mg Oral TID  . losartan  100 mg Oral Daily  . magnesium oxide  400 mg Oral Daily  . metoprolol tartrate  12.5 mg Oral BID  . mometasone-formoterol  2 puff Inhalation BID  . multivitamin with minerals  1 tablet Oral q morning - 10a  . pantoprazole  40 mg Oral Daily  .  rOPINIRole  2 mg Oral QHS  . traZODone  50 mg Oral QHS   Continuous Infusions: . ceFEPime (MAXIPIME) IV Stopped (07/18/18 0532)  . dextrose 5 % and 0.45% NaCl 75 mL/hr at 07/18/18 0557  . metronidazole 100 mL/hr at 07/18/18 0612     LOS: 2 days   Marylu Lund, MD [facility] Pager On Amion  If 7PM-7AM, please contact night-coverage 07/18/2018, 2:46 PM

## 2018-07-19 DIAGNOSIS — J9611 Chronic respiratory failure with hypoxia: Secondary | ICD-10-CM

## 2018-07-19 DIAGNOSIS — K5900 Constipation, unspecified: Secondary | ICD-10-CM

## 2018-07-19 LAB — COMPREHENSIVE METABOLIC PANEL
ALT: 165 U/L — ABNORMAL HIGH (ref 0–44)
AST: 80 U/L — ABNORMAL HIGH (ref 15–41)
Albumin: 3 g/dL — ABNORMAL LOW (ref 3.5–5.0)
Alkaline Phosphatase: 56 U/L (ref 38–126)
Anion gap: 9 (ref 5–15)
BILIRUBIN TOTAL: 0.5 mg/dL (ref 0.3–1.2)
BUN: 10 mg/dL (ref 8–23)
CALCIUM: 9.1 mg/dL (ref 8.9–10.3)
CO2: 30 mmol/L (ref 22–32)
Chloride: 100 mmol/L (ref 98–111)
Creatinine, Ser: 0.85 mg/dL (ref 0.61–1.24)
Glucose, Bld: 147 mg/dL — ABNORMAL HIGH (ref 70–99)
Potassium: 4.6 mmol/L (ref 3.5–5.1)
Sodium: 139 mmol/L (ref 135–145)
Total Protein: 6.1 g/dL — ABNORMAL LOW (ref 6.5–8.1)

## 2018-07-19 LAB — CBC
HCT: 28.7 % — ABNORMAL LOW (ref 39.0–52.0)
Hemoglobin: 8.2 g/dL — ABNORMAL LOW (ref 13.0–17.0)
MCH: 28.2 pg (ref 26.0–34.0)
MCHC: 28.6 g/dL — ABNORMAL LOW (ref 30.0–36.0)
MCV: 98.6 fL (ref 80.0–100.0)
NRBC: 0.3 % — AB (ref 0.0–0.2)
PLATELETS: 365 10*3/uL (ref 150–400)
RBC: 2.91 MIL/uL — AB (ref 4.22–5.81)
RDW: 15.2 % (ref 11.5–15.5)
WBC: 9.9 10*3/uL (ref 4.0–10.5)

## 2018-07-19 LAB — GLUCOSE, CAPILLARY
Glucose-Capillary: 213 mg/dL — ABNORMAL HIGH (ref 70–99)
Glucose-Capillary: 199 mg/dL — ABNORMAL HIGH (ref 70–99)

## 2018-07-19 MED ORDER — POLYETHYLENE GLYCOL 3350 17 G PO PACK: 17.0000 g | PACK | Freq: Every day | ORAL | 0 refills | Status: DC

## 2018-07-19 MED ORDER — FERROUS SULFATE 325 (65 FE) MG PO TABS: 325.0000 mg | ORAL_TABLET | Freq: Two times a day (BID) | ORAL | 0 refills | Status: DC

## 2018-07-19 NOTE — Discharge Summary (Signed)
Physician Discharge Summary  Brett [name] AJO:[id number] DOB: May 28, 1951 DOA: 07/16/2018  PCP: [name], MD  Admit date: 07/16/2018 Discharge date: 07/19/2018  Admitted From: Home Disposition:  Home  Recommendations for Outpatient Follow-up:  1. Follow up with PCP in 1-2 weeks  Discharge Condition:Improved CODE STATUS:Full Diet recommendation: Heart healthy, diabetic   Brief/Interim Summary: 67 y.o.malewith medical history significant ofSitus inversus totalis, COPD on 3Lhome O2, chronic diastolic CHF, DM, HTN.  Patient presents to the ED with c/o low back pain following a fall at home.He tripped over his oxygen tubing and lost his balance. He fell backwards onto his bottom back. He said increased pain since that time. Tolerable at rest. Sniffily worse in sitting up or trying to ambulate. He is having a hard time walking because of the pain but does not really feel like his legs are weak. No acute numbness or tingling.Fall occurred on halloween night.  Additionally has LUQpain, radiates to neck.  Discharge Diagnoses:  Principal Problem:   Constipation Active Problems:   Iron deficiency anemia due to chronic blood loss   Insulin-requiring or dependent type II diabetes mellitus (HCC)   Chronic diastolic CHF (congestive heart failure) (HCC)   COPD (chronic obstructive pulmonary disease) (HCC)   Situs inversus totalis   Chronic respiratory failure with hypoxia (HCC)   HTN (hypertension)  1. Acute cholecystitis ruled out with situs inversus totalis - 1. Continued on empiric Cefepime and flagyl 2. Repeat CMP with improvement in LFT's 3. General Surgery following 4. Patient underwent HIDA which was found to be unremarkable study. Surgery since signed off 5. Advanced diet 6. On review of CT abd, stool noted throughout most of colon , see below 7. Abd pain much improved 2. COPD - 1. At baseline 2. Continue on 3LO2at baseline 3. Cont home nebs 3. Chronic  Diastolic CHF - 1. Lasix briefly on hold at time of admit 2. Continued other home meds 3. Resume meds on discharge 4. HTN - continue home meds as tolerated 5. Iron def anemia due to chronic blood loss - 1. Will start PO iron since HGB trending down over past few months. 2. Remained stable 6. DM2 - 1. Hold home PO meds 2. Levemir 30 BID while in hospital (takes 30 and 60 at home) 3. Mod scale SSI Q4H while in hospital 4. Resume home meds on discharge 7. RLLinfiltrate vs atelectasis- 1. probablyatelectasis 2. Stable at present 3. Will hold off on empiric HCAP treatment for now. 8. Constipation 1. Stool noted on CT per my own read per above 2. Very good results with soap suds enema and resolution of presenting symptoms   Discharge Instructions   Allergies as of 07/19/2018   No Known Allergies     Medication List    STOP taking these medications   oxyCODONE-acetaminophen 5-325 MG tablet Commonly known as:  PERCOCET/ROXICET     TAKE these medications   albuterol 108 (90 Base) MCG/ACT inhaler Commonly known as:  PROVENTIL HFA;VENTOLIN HFA Inhale 2 puffs into the lungs every 4 (four) hours as needed for wheezing or shortness of breath.   aspirin EC 81 MG tablet Take 81 mg by mouth daily.   budesonide-formoterol 160-4.5 MCG/ACT inhaler Commonly known as:  SYMBICORT Inhale 2 puffs into the lungs 2 (two) times daily. What changed:    when to take this  reasons to take this   DEXILANT 60 MG capsule Generic drug:  dexlansoprazole TAKE (1) CAPSULE BY MOUTH EVERY DAY. What changed:  See the  new instructions.   DULoxetine 60 MG capsule Commonly known as:  CYMBALTA Take 60 mg by mouth daily.   fenofibrate 145 MG tablet Commonly known as:  TRICOR Take 145 mg by mouth every evening.   ferrous sulfate 325 (65 FE) MG tablet Take 1 tablet (325 mg total) by mouth 2 (two) times daily with a meal.   folic acid 1 MG tablet Commonly known as:  FOLVITE Take 1 mg by  mouth daily.   furosemide 40 MG tablet Commonly known as:  LASIX Take 60 mg in the morning, take 40 mg in the evening. What changed:    how much to take  how to take this  when to take this   gabapentin 600 MG tablet Commonly known as:  NEURONTIN Take 1 tablet (600 mg total) by mouth 2 (two) times daily. What changed:  when to take this   insulin detemir 100 UNIT/ML injection Commonly known as:  LEVEMIR Inject 30-60 Units into the skin See admin instructions. 30 units in the morning and 60 units at bedtime   losartan 100 MG tablet Commonly known as:  COZAAR Take 100 mg by mouth daily.   Magnesium 400 MG Tabs Take 400 mg by mouth daily.   meloxicam 7.5 MG tablet Commonly known as:  MOBIC Take 2 tablets (15 mg total) by mouth daily as needed for pain.   metFORMIN 1000 MG tablet Commonly known as:  GLUCOPHAGE Take 1,000 mg by mouth 2 (two) times daily with a meal.   metoprolol tartrate 25 MG tablet Commonly known as:  LOPRESSOR Take 0.5 tablets (12.5 mg total) by mouth 2 (two) times daily.   multivitamin tablet Take 1 tablet by mouth every morning.   OXYGEN Inhale 3-4 L into the lungs continuous.   pioglitazone 45 MG tablet Commonly known as:  ACTOS Take 45 mg by mouth daily.   polyethylene glycol packet Commonly known as:  MIRALAX / GLYCOLAX Take 17 g by mouth daily.   rOPINIRole 2 MG tablet Commonly known as:  REQUIP Take 2 mg by mouth at bedtime.   rosuvastatin 40 MG tablet Commonly known as:  CRESTOR Take 1 tablet by mouth every evening.   sitaGLIPtin 100 MG tablet Commonly known as:  JANUVIA Take 100 mg by mouth daily.   traZODone 50 MG tablet Commonly known as:  DESYREL Take 50 mg by mouth at bedtime.      Follow-up Information    [name], MD. Schedule an appointment as soon as possible for a visit in 2 week(s).   Specialty:  Internal Medicine Contact information: [address] [place]  [address] [phone]        Arnoldo Lenis, MD .   Specialty:  Cardiology Contact information: [address] [place] [address] [phone]          No Known Allergies  Consultations:  General Surgery  Procedures/Studies: Ct Abdomen Pelvis Wo Contrast  Result Date: 07/16/2018 CLINICAL DATA:  Fall 4 days ago with left-sided pain. EXAM: CT ABDOMEN AND PELVIS WITHOUT CONTRAST TECHNIQUE: Multidetector CT imaging of the abdomen and pelvis was performed following the standard protocol without IV contrast. COMPARISON:  04/19/2014 FINDINGS: Lower chest: Heart size upper normal. Patchy ground-glass attenuation noted in the lower lobes bilaterally with chronic volume loss in the right lower lobe, similar to prior. Of note, this patient has situs inversus totalis. Hepatobiliary: No focal abnormality in the liver on this study without intravenous contrast. Gallbladder is distended  with pericholecystic edema, most prominent in the region of the gallbladder neck. No intrahepatic or extrahepatic biliary dilation. Pancreas: No focal mass lesion. No dilatation of the main duct. No intraparenchymal cyst. No peripancreatic edema. Spleen: No splenomegaly. No focal mass lesion. Adrenals/Urinary Tract: No adrenal nodule or mass. Kidneys unremarkable. No evidence for hydroureter. The urinary bladder appears normal for the degree of distention. Stomach/Bowel: Stomach is nondistended. No gastric wall thickening. No evidence of outlet obstruction. Duodenum is normally positioned as is the ligament of Treitz. There is edema/inflammation associated with the descending and proximal transverse segments of the duodenum. No small bowel wall thickening. No small bowel dilatation. The terminal ileum is normal. The appendix is normal. No gross colonic mass. No colonic wall thickening. No substantial diverticular change. Vascular/Lymphatic: No abdominal aortic aneurysm. There is no gastrohepatic or hepatoduodenal  ligament lymphadenopathy. No intraperitoneal or retroperitoneal lymphadenopathy. Upper normal portal caval lymph node is stable in the interval. No pelvic sidewall lymphadenopathy. Reproductive: The prostate gland and seminal vesicles have normal imaging features. Other: Trace fluid is noted adjacent to the liver. Edema is noted within the retroperitoneal tissues tracking inferiorly on the left. Musculoskeletal: No worrisome lytic or sclerotic osseous abnormality. IMPRESSION: 1. Situs inversus totalis. The patient has edema/inflammation around the gallbladder, extending into the gallbladder fossa and hepatoduodenal ligament seen tracking around the descending duodenum and then down the left retroperitoneal space towards the pelvis. Given the situs inversus, these changes are in the left abdomen. Although no gallstones are evident by CT, acute cholecystitis would be a consideration. Epicenter of edema/inflammation arising from the duodenum is also consideration and duodenitis should be considered. No substantial edema in the head of the pancreas to suggest groove pancreatitis. Electronically Signed   By: Misty Stanley [name]   On: 07/16/2018 19:48   Dg Chest 2 View  Result Date: 07/16/2018 CLINICAL DATA:  Abdominal pain EXAM: CHEST - 2 VIEW COMPARISON:  04/21/2018, CT chest 06/08/2018 FINDINGS: Postsurgical changes in the cervical spine. Increased opacity at the right base. No pleural effusion. No pneumothorax. Stable enlarged cardiomediastinal silhouette with right arch and right-sided cardiac apex. Situs inversus as noted on multiple prior studies. IMPRESSION: 1. Increased right basilar opacity may reflect atelectasis or infiltrate. 2. Situs anomaly as noted on previous exams. Stable enlarged cardiomediastinal silhouette with right arch and right cardiac apex. Electronically Signed   By: Donavan Foil [name]   On: 07/16/2018 19:06   Dg Lumbar Spine 2-3 Views  Result Date: 07/16/2018 CLINICAL DATA:  Chronic low  back pain radiating down the left leg for several years. EXAM: LUMBAR SPINE - 2-3 VIEW COMPARISON:  Lumbar spine radiographs 07/13/2018. CT abdomen and pelvis 07/16/2018. Lumbar spine MRI 06/18/2018 FINDINGS: There are 5 non rib-bearing lumbar type vertebrae. There is mild lumbar dextroscoliosis. There is no listhesis. Mild superior endplate compression deformities at L1 and L2 are new from last month's MRI and are without retropulsion. There is mild lower thoracic and lumbar spondylosis, unchanged. IMPRESSION: Mild L1 and L2 compression fractures, new from a 06/18/2018 MRI. Electronically Signed   By: Logan Bores [name]   On: 07/16/2018 21:48   Dg Lumbar Spine Complete  Result Date: 07/13/2018 CLINICAL DATA:  Fall. EXAM: LUMBAR SPINE - COMPLETE 4+ VIEW COMPARISON:  Lumbar spine 02/10/2018. FINDINGS: Lumbar scoliosis concave left. Diffuse degenerative change. No acute bony abnormality identified. No evidence of fracture. IMPRESSION: Lumbar spine scoliosis concave left with diffuse multilevel degenerative change. No acute bony abnormality identified. Exam. Stable from prior exam. Electronically  Signed   By: Marcello Moores  Register   On: 07/13/2018 11:10   Ct Cervical Spine Wo Contrast  Result Date: 07/16/2018 CLINICAL DATA:  Fall with neck pain EXAM: CT CERVICAL SPINE WITHOUT CONTRAST TECHNIQUE: Multidetector CT imaging of the cervical spine was performed without intravenous contrast. Multiplanar CT image reconstructions were also generated. COMPARISON:  X-rays from 12/13/2010 FINDINGS: Alignment: Straightening of normal cervical lordosis evident. Skull base and vertebrae: No acute fracture. No primary bone lesion or focal pathologic process. Soft tissues and spinal canal: No prevertebral fluid or swelling. No visible canal hematoma. Disc levels: Loss of disc height noted at C3-4 and C4-5 with endplate spurring. Patient is status post anterior cervical discectomy with plate at C 5 6 and C3-7. Solid bony fusion noted  across the C5-6 and C6-7 disc space. Loss of disc height noted at C7-T1. Upper chest: Irregular pleuroparenchymal opacity identified posterior left lung apex. This is similar to diagnostic chest CT of 04/22/2018 and may reflect scarring. Other: None. IMPRESSION: 1. No acute bony abnormality. 2. Discectomy with anterior fusion at C3-4 and C4-5. 3. Degenerative changes at C3-4, C4-5, and C7-T1. Electronically Signed   By: Misty Stanley [name]   On: 07/16/2018 20:08   Nm Hepato W/eject Fract  Result Date: 07/18/2018 CLINICAL DATA:  Cholelithiasis, pericholecystic fluid by ultrasound, question acute cholecystitis, no gallstones visualized; patient with history of situs inversus to thallous EXAM: NUCLEAR MEDICINE HEPATOBILIARY IMAGING WITH GALLBLADDER EF TECHNIQUE: Sequential images of the abdomen were obtained out to 60 minutes following intravenous administration of radiopharmaceutical. After oral ingestion of Ensure, gallbladder ejection fraction was determined. At 60 min, normal ejection fraction is greater than 33%. RADIOPHARMACEUTICALS:  4.9 mCi Tc-13m  Choletec IV COMPARISON:  CT abdomen and pelvis 07/16/2018 FINDINGS: Liver LEFT upper quadrant. Normal tracer extraction from bloodstream indicating normal hepatocellular function. Normal excretion of tracer into biliary tree. Gallbladder visualized at 12 min. Small bowel visualized at 31 min. No hepatic retention of tracer. Subjectively normal emptying of tracer from gallbladder following fatty meal stimulation. Calculated gallbladder ejection fraction is 87%, normal. Patient reported no symptoms following Ensure ingestion. Normal gallbladder ejection fraction following Ensure ingestion is greater than 33% at 1 hour. IMPRESSION: Patent biliary tree with note of liver in LEFT upper quadrant (known situs inversus totalis). Normal gallbladder response to fatty meal stimulation with a normal gallbladder ejection fraction of 87%. Electronically Signed   By: Lavonia Dana  [name]   On: 07/18/2018 16:17   US Abdomen Limited  Result Date: 07/17/2018 CLINICAL DATA:  History of situs inversus.  Nausea. EXAM: ULTRASOUND ABDOMEN LIMITED left UPPER QUADRANT COMPARISON:  CT scan of July 16, 2018. FINDINGS: Gallbladder: No gallstones or wall thickening visualized. No sonographic Murphy sign noted by sonographer. Small amount of pericholecystic fluid is noted. Common bile duct: Diameter: 5 mm which is within normal limits. Liver: No focal lesion identified. Within normal limits in parenchymal echogenicity. Portal vein is patent on color Doppler imaging with normal direction of blood flow towards the liver. IMPRESSION: Situs inversus is noted. Small amount of pericholecystic fluid is noted around the gallbladder, although no gallbladder wall thickening or cholelithiasis is noted. No biliary dilatation is noted. Liver is unremarkable. Electronically Signed   By: Marijo Conception, [name]   On: 07/17/2018 08:24   US Arterial Abi (screening Lower Extremity)  Result Date: 06/25/2018 CLINICAL DATA:  Bilateral lower extremity claudication EXAM: NONINVASIVE PHYSIOLOGIC VASCULAR STUDY OF BILATERAL LOWER EXTREMITIES TECHNIQUE: Evaluation of both lower extremities were performed at  rest, including calculation of ankle-brachial indices with single level Doppler, pressure and pulse volume recording. COMPARISON:  None. FINDINGS: Right ABI:  1.24 Left ABI:  1.07 Right Lower Extremity:  Normal arterial waveforms at the ankle. Left Lower Extremity:  Normal arterial waveforms at the ankle. IMPRESSION: No significant arterial occlusive disease in the lower extremities. Electronically Signed   By: Marybelle Killings [name]   On: 06/25/2018 13:58    Subjective: Feeling better after bowel movement and eager to go home  Discharge Exam: Vitals:   07/19/18 0540 07/19/18 0836  BP: (!) 148/86   Pulse: 88   Resp: 20   Temp: 98.1 F (36.7 C)   SpO2: 97% 96%   Vitals:   07/18/18 1926 07/18/18 2040 07/19/18  0540 07/19/18 0836  BP:  (!) 157/52 (!) 148/86   Pulse:  85 88   Resp:  18 20   Temp:  97.9 F (36.6 C) 98.1 F (36.7 C)   TempSrc:  Oral Oral   SpO2: 92% 96% 97% 96%  Weight:      Height:        General: Pt is alert, awake, not in acute distress Cardiovascular: RRR, S1/S2 +, no rubs, no gallops Respiratory: CTA bilaterally, no wheezing, no rhonchi Abdominal: Soft, NT, ND, bowel sounds + Extremities: no edema, no cyanosis   The results of significant diagnostics from this hospitalization (including imaging, microbiology, ancillary and laboratory) are listed below for reference.     Microbiology: Recent Results (from the past 240 hour(s))  Urine culture     Status: None   Collection Time: 07/16/18  5:07 PM  Result Value Ref Range Status   Specimen Description   Final    URINE, RANDOM Performed at [facility], [address] [place], [place] [address]    Special Requests   Final    NONE Performed at [facility], [address] [place], [place] [address]    Culture   Final    NO GROWTH Performed at [facility], [place] [address], [place], [place] [address]    Report Status 07/18/2018 FINAL  Final  Culture, blood (routine x 2) Call MD if unable to obtain prior to antibiotics being given     Status: None (Preliminary result)   Collection Time: 07/16/18  8:22 PM  Result Value Ref Range Status   Specimen Description BLOOD LEFT ARM  Final   Special Requests   Final    BOTTLES DRAWN AEROBIC AND ANAEROBIC Blood Culture adequate volume   Culture   Final    NO GROWTH 3 DAYS Performed at [facility], [address], [place], [place] [address]    Report Status PENDING  Incomplete  Culture, blood (routine x 2) Call MD if unable to obtain prior to antibiotics being given     Status: None (Preliminary result)   Collection Time: 07/16/18  8:25 PM  Result Value Ref Range Status   Specimen Description BLOOD LEFT HAND  Final   Special Requests   Final    BOTTLES DRAWN  AEROBIC AND ANAEROBIC Blood Culture adequate volume   Culture   Final    NO GROWTH 3 DAYS Performed at [facility], [address] [place],  [address]    Report Status PENDING  Incomplete  MRSA PCR Screening     Status: None   Collection Time: 07/16/18  8:44 PM  Result Value Ref Range Status   MRSA by PCR NEGATIVE NEGATIVE Final    Comment:  The GeneXpert MRSA Assay (FDA approved for NASAL specimens only), is one component of a comprehensive MRSA colonization surveillance program. It is not intended to diagnose MRSA infection nor to guide or monitor treatment for MRSA infections. Performed at [facility], [address], [place], [place] [address]      Labs: BNP (last 3 results) Recent Labs    04/10/18 1308 04/21/18 0846 07/16/18 1807  BNP 231.0* 499.0* 604.5*   Basic Metabolic Panel: Recent Labs  Lab 07/16/18 1807 07/17/18 0314 07/18/18 0727 07/19/18 0135  NA 131* 134* 137 139  K 5.1 4.6 5.1 4.6  CL 91* 95* 97* 100  CO2 30 31 32 30  GLUCOSE 166* 71 156* 147*  BUN 19 16 12 10   CREATININE 1.10 0.95 0.94 0.85  CALCIUM 8.8* 9.1 8.9 9.1  MG 1.9  --   --   --    Liver Function Tests: Recent Labs  Lab 07/16/18 1807 07/17/18 0314 07/18/18 0727 07/19/18 0135  AST 295* 385* 170* 80*  ALT 198* 249* 206* 165*  ALKPHOS 55 55 59 56  BILITOT 0.6 0.5 0.7 0.5  PROT 7.0 6.8 5.3* 6.1*  ALBUMIN 3.7 3.5 2.9* 3.0*   Recent Labs  Lab 07/16/18 1807 07/18/18 0727  LIPASE 22 27   No results for input(s): AMMONIA in the last 168 hours. CBC: Recent Labs  Lab 07/16/18 1807 07/17/18 0314 07/18/18 0727 07/19/18 0135  WBC 13.1* 11.3* 11.8* 9.9  NEUTROABS 11.2*  --   --   --   HGB 8.1* 7.5* 7.8* 8.2*  HCT 27.0* 25.8* 26.8* 28.7*  MCV 97.1 96.3 96.4 98.6  PLT 408* 395 445* 365   Cardiac Enzymes: Recent Labs  Lab 07/16/18 1807 07/16/18 2058 07/17/18 0314 07/17/18 0931  TROPONINI 0.14* 0.17* 0.14* 0.12*   BNP: Invalid input(s):  POCBNP CBG: Recent Labs  Lab 07/18/18 1202 07/18/18 1638 07/18/18 2036 07/19/18 0819 07/19/18 1223  GLUCAP 154* 240* 265* 213* 199*   D-Dimer No results for input(s): DDIMER in the last 72 hours. Hgb A1c No results for input(s): HGBA1C in the last 72 hours. Lipid Profile No results for input(s): CHOL, HDL, LDLCALC, TRIG, CHOLHDL, LDLDIRECT in the last 72 hours. Thyroid function studies No results for input(s): TSH, T4TOTAL, T3FREE, THYROIDAB in the last 72 hours.  Invalid input(s): FREET3 Anemia work up No results for input(s): VITAMINB12, FOLATE, FERRITIN, TIBC, IRON, RETICCTPCT in the last 72 hours. Urinalysis    Component Value Date/Time   COLORURINE YELLOW 07/16/2018 1707   APPEARANCEUR CLEAR 07/16/2018 1707   LABSPEC 1.010 07/16/2018 1707   PHURINE 6.0 07/16/2018 1707   GLUCOSEU NEGATIVE 07/16/2018 1707   HGBUR NEGATIVE 07/16/2018 1707   BILIRUBINUR NEGATIVE 07/16/2018 1707   KETONESUR NEGATIVE 07/16/2018 1707   PROTEINUR NEGATIVE 07/16/2018 1707   UROBILINOGEN 0.2 05/13/2015 1440   NITRITE NEGATIVE 07/16/2018 1707   LEUKOCYTESUR NEGATIVE 07/16/2018 1707   Sepsis Labs Invalid input(s): PROCALCITONIN,  WBC,  LACTICIDVEN Microbiology Recent Results (from the past 240 hour(s))  Urine culture     Status: None   Collection Time: 07/16/18  5:07 PM  Result Value Ref Range Status   Specimen Description   Final    URINE, RANDOM Performed at [facility], [address] [place], [place] [address]    Special Requests   Final    NONE Performed at [facility], [address] [place],  [address]    Culture   Final    NO GROWTH Performed at [facility], [address], [place], [place] [address]    Report Status 07/18/2018 FINAL  Final  Culture, blood (routine x 2) Call MD if unable to obtain prior to antibiotics being given     Status: None (Preliminary result)   Collection Time: 07/16/18  8:22 PM  Result Value Ref Range Status   Specimen  Description BLOOD LEFT ARM  Final   Special Requests   Final    BOTTLES DRAWN AEROBIC AND ANAEROBIC Blood Culture adequate volume   Culture   Final    NO GROWTH 3 DAYS Performed at [facility], [address], [place], [place] [address]    Report Status PENDING  Incomplete  Culture, blood (routine x 2) Call MD if unable to obtain prior to antibiotics being given     Status: None (Preliminary result)   Collection Time: 07/16/18  8:25 PM  Result Value Ref Range Status   Specimen Description BLOOD LEFT HAND  Final   Special Requests   Final    BOTTLES DRAWN AEROBIC AND ANAEROBIC Blood Culture adequate volume   Culture   Final    NO GROWTH 3 DAYS Performed at [facility], [address], [place], [place] [address]    Report Status PENDING  Incomplete  MRSA PCR Screening     Status: None   Collection Time: 07/16/18  8:44 PM  Result Value Ref Range Status   MRSA by PCR NEGATIVE NEGATIVE Final    Comment:        The GeneXpert MRSA Assay (FDA approved for NASAL specimens only), is one component of a comprehensive MRSA colonization surveillance program. It is not intended to diagnose MRSA infection nor to guide or monitor treatment for MRSA infections. Performed at [facility], [address], [place], [place] [address]    Time spent: 30 min  SIGNED:   Marylu Lund, MD  [facility] 07/19/2018, 5:02 PM  If 7PM-7AM, please contact night-coverage

## 2018-07-21 LAB — CULTURE, BLOOD (ROUTINE X 2)
CULTURE: NO GROWTH
CULTURE: NO GROWTH
Special Requests: ADEQUATE
Special Requests: ADEQUATE

## 2018-07-27 ENCOUNTER — Ambulatory Visit (INDEPENDENT_AMBULATORY_CARE_PROVIDER_SITE_OTHER): Payer: 59 | Admitting: Student

## 2018-07-27 ENCOUNTER — Encounter: Payer: Self-pay | Admitting: Student

## 2018-07-27 VITALS — BP 130/62 | HR 84 | Ht 66.0 in | Wt 256.0 lb

## 2018-07-27 DIAGNOSIS — J984 Other disorders of lung: Secondary | ICD-10-CM | POA: Diagnosis not present

## 2018-07-27 DIAGNOSIS — Q893 Situs inversus: Secondary | ICD-10-CM

## 2018-07-27 DIAGNOSIS — E785 Hyperlipidemia, unspecified: Secondary | ICD-10-CM

## 2018-07-27 DIAGNOSIS — I2510 Atherosclerotic heart disease of native coronary artery without angina pectoris: Secondary | ICD-10-CM | POA: Diagnosis not present

## 2018-07-27 DIAGNOSIS — I5032 Chronic diastolic (congestive) heart failure: Secondary | ICD-10-CM

## 2018-07-27 DIAGNOSIS — I10 Essential (primary) hypertension: Secondary | ICD-10-CM

## 2018-07-27 NOTE — Progress Notes (Signed)
Cardiology Office Note    Date:  07/27/2018   ID:  Brett [name], DOB August 12, 1951, MRN [id number]  PCP:  [name], MD  Cardiologist: Carlyle Dolly, MD    Chief Complaint  Patient presents with  . Follow-up    1 month visit    History of Present Illness:    Brett [name] is a 67 y.o. male with past medical history of chronic diastolic CHF, CAD, restrictive lung disease (known Kartagener's Syndrome with bronchiectasis),situs inversus totalis,HTN, HLD, Type 2 DM, asthma, and anemia who presents to the office today for one-month follow-up.  He was last examined by Dr. Harl Bowie in 06/2018 and reported having worsening dyspnea but said that his leg swelling had improved since Lasix was titrated to 60 mg daily at the time of his last visit. A Coronary CT had been obtained and showed mid LAD stenosis which was significant by FFR. He denied any recent chest pain and it was unclear if his symptoms was due to this or more likely diastolic CHF and severe bronchiectasis. It was recommended to continue to follow his respiratory status at that time and if he developed worsening symptoms, could consider catheterization in the future once his respiratory status improved. Lasix was further titrated to 60mg  in AM/40mg  in PM.    In the interim, he was admitted to [facility] from 07/16/2018 to 07/19/2018 after having suffered a mechanical fall and developing worsening back pain. He also reported having left upper quadrant pain and LFT's were elevated. There was concern for acute cholecystitis and he was started on Cefepime and Flagyl at the time of admission. He was evaluated by surgery and was felt to be a very high risk/poor candidate for cholecystectomy therefore a HIDA scan was recommended for evaluation. This showed a patent biliary tree with normal gallbladder response and normal gallbladder ejection fraction, therefore there was no indication for surgical intervention. He was discharged and informed to  keep his outpatient follow-up visits with no changes made to his cardiac medication regimen at that time.  In talking with the patient today, he reports overall doing very well since his recent hospitalization and reports that his abdominal pain resolved with using MiraLAX regularly. Reports that his respiratory status is back to baseline and he has actually been trying to wean down his home oxygen use. He sleeps in a recliner nightly due to chronic back pain and is being followed closely by [employer] with plans for a spinal injection in the coming weeks.He reports that his back pain is debilitating and plays a significant role in his decreased activity. He denies any recent chest pain, palpitations, orthopnea, PND, or lower extremity edema. No recent lightheadedness, dizziness, or presyncope.  Past Medical History:  Diagnosis Date  . Depression   . Dextrocardia   . Diabetes mellitus   . H. pylori infection 11/09/2012   treated with pylera  . HTN (hypertension)    Cholesterol  . Iron deficiency anemia, unspecified 10/18/2012  . Neuropathy   . Pneumonia   . Situs inversus totalis   . Tachycardia     Past Surgical History:  Procedure Laterality Date  . CATARACT EXTRACTION, BILATERAL  2016  . COLONOSCOPY WITH ESOPHAGOGASTRODUODENOSCOPY (EGD) N/A 11/09/2012   Dr. 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 hpylori on bx- treated with pylera. TCS- normal rectum. 1 diminutive polyp in the mid descending segment. 1-48mm polyp in the mid desending segment o/w the remainder  of the colonic mucosa appeared normal. tubular adenoma on bx  . GIVENS CAPSULE STUDY N/A 10/07/2013   no source for anemia or heme positive stool noted  . NECK SURGERY     bone spurs    Current Medications: Outpatient Medications Prior to Visit  Medication Sig Dispense Refill  . albuterol (PROAIR HFA) 108 (90 Base) MCG/ACT inhaler  Inhale 2 puffs into the lungs every 4 (four) hours as needed for wheezing or shortness of breath. 1 Inhaler 2  . aspirin EC 81 MG tablet Take 81 mg by mouth daily.    . budesonide-formoterol (SYMBICORT) 160-4.5 MCG/ACT inhaler Inhale 2 puffs into the lungs 2 (two) times daily. (Patient taking differently: Inhale 2 puffs into the lungs daily as needed (for shortness of breath). ) 1 Inhaler 5  . DEXILANT 60 MG capsule TAKE (1) CAPSULE BY MOUTH EVERY DAY. (Patient taking differently: Take 60 mg by mouth daily. ) 90 capsule 3  . DULoxetine (CYMBALTA) 60 MG capsule Take 60 mg by mouth daily.    . fenofibrate (TRICOR) 145 MG tablet Take 145 mg by mouth every evening.     . ferrous sulfate 325 (65 FE) MG tablet Take 1 tablet (325 mg total) by mouth 2 (two) times daily with a meal. 60 tablet 0  . folic acid (FOLVITE) 1 MG tablet Take 1 mg by mouth daily.    . furosemide (LASIX) 40 MG tablet Take 60 mg in the morning, take 40 mg in the evening. (Patient taking differently: Take 40-60 mg by mouth See admin instructions. Take 60 mg in the morning, take 40 mg in the evening.) 225 tablet 3  . gabapentin (NEURONTIN) 600 MG tablet Take 1 tablet (600 mg total) by mouth 2 (two) times daily. (Patient taking differently: Take 600 mg by mouth 3 (three) times daily. ) 20 tablet 0  . insulin detemir (LEVEMIR) 100 UNIT/ML injection Inject 30-60 Units into the skin See admin instructions. 30 units in the morning and 60 units at bedtime    . losartan (COZAAR) 100 MG tablet Take 100 mg by mouth daily.    . Magnesium 400 MG TABS Take 400 mg by mouth daily.     . meloxicam (MOBIC) 7.5 MG tablet Take 2 tablets (15 mg total) by mouth daily as needed for pain. 7 tablet 0  . metFORMIN (GLUCOPHAGE) 1000 MG tablet Take 1,000 mg by mouth 2 (two) times daily with a meal.    . metoprolol tartrate (LOPRESSOR) 25 MG tablet Take 0.5 tablets (12.5 mg total) by mouth 2 (two) times daily. 60 tablet 1  . Multiple Vitamin (MULTIVITAMIN) tablet  Take 1 tablet by mouth every morning.     . OXYGEN Inhale 3-4 L into the lungs continuous.    . pioglitazone (ACTOS) 45 MG tablet Take 45 mg by mouth daily.    . polyethylene glycol (MIRALAX / GLYCOLAX) packet Take 17 g by mouth daily. 14 each 0  . rOPINIRole (REQUIP) 2 MG tablet Take 2 mg by mouth at bedtime.     . rosuvastatin (CRESTOR) 40 MG tablet Take 1 tablet by mouth every evening.    . sitaGLIPtin (JANUVIA) 100 MG tablet Take 100 mg by mouth daily.     . traZODone (DESYREL) 50 MG tablet Take 50 mg by mouth at bedtime.      No facility-administered medications prior to visit.      Allergies:   Patient has no known allergies.   Social History   Socioeconomic History  .  Marital status: Married    Spouse name: Not on file  . Number of children: 0  . Years of education: 7th grade   . Highest education level: Not on file  Occupational History  . Occupation: Lexicographer  . Financial resource strain: Not on file  . Food insecurity:    Worry: Not on file    Inability: Not on file  . Transportation needs:    Medical: Not on file    Non-medical: Not on file  Tobacco Use  . Smoking status: Never Smoker  . Smokeless tobacco: Current User    Types: Chew  Substance and Sexual Activity  . Alcohol use: No  . Drug use: No  . Sexual activity: Not on file  Lifestyle  . Physical activity:    Days per week: Not on file    Minutes per session: Not on file  . Stress: Not on file  Relationships  . Social connections:    Talks on phone: Not on file    Gets together: Not on file    Attends religious service: Not on file    Active member of club or organization: Not on file    Attends meetings of clubs or organizations: Not on file    Relationship status: Not on file  Other Topics Concern  . Not on file  Social History Narrative  . Not on file     Family History:  The patient's family history includes Arthritis in his sister; Asthma in his sister; Diabetes in his  sister; Heart defect in his sister; Kidney disease in his sister; Pancreatitis in his sister.   Review of Systems:   Please see the history of present illness.     General:  No chills, fever, night sweats or weight changes.  Cardiovascular:  No chest pain, dyspnea on exertion, edema, orthopnea, palpitations, paroxysmal nocturnal dyspnea.  Dermatological: No rash, lesions/masses Respiratory: No cough, Positive for dyspnea (at baseline).  Urologic: No hematuria, dysuria Abdominal:   No nausea, vomiting, diarrhea, bright red blood per rectum, melena, or hematemesis Neurologic:  No visual changes, wkns, changes in mental status. All other systems reviewed and are otherwise negative except as noted above.   Physical Exam:    VS:  BP 130/62 (BP Location: Right Arm)   Pulse 84   Ht 5\' 6"  (1.676 m)   Wt 256 lb (116.1 kg)   SpO2 98%   BMI 41.32 kg/m    General: Well developed, well nourished Caucasian male appearing in no acute distress. Head: Normocephalic, atraumatic, sclera non-icteric, no xanthomas, nares are without discharge.  Neck: No carotid bruits. JVD not elevated.  Lungs: Respirations regular and unlabored, without wheezes or rales. On supplemental oxygen. Heart: Regular rate and rhythm. No S3 or S4.  No murmur, no rubs, or gallops appreciated. Abdomen: Soft, non-tender, non-distended with normoactive bowel sounds. No hepatomegaly. No rebound/guarding. No obvious abdominal masses. Msk:  Strength and tone appear normal for age. No joint deformities or effusions. Extremities: No clubbing or cyanosis. Trace ankle edema bilaterally.  Distal pedal pulses are 2+ bilaterally. Neuro: Alert and oriented X 3. Moves all extremities spontaneously. No focal deficits noted. Psych:  Responds to questions appropriately with a normal affect. Skin: No rashes or lesions noted  Wt Readings from Last 3 Encounters:  07/27/18 256 lb (116.1 kg)  07/16/18 270 lb (122.5 kg)  07/13/18 260 lb (117.9 kg)     Studies/Labs Reviewed:   EKG:  EKG is not ordered  today.   Recent Labs: 07/16/2018: B Natriuretic Peptide 393.0; Magnesium 1.9 07/19/2018: ALT 165; BUN 10; Creatinine, Ser 0.85; Hemoglobin 8.2; Platelets 365; Potassium 4.6; Sodium 139   Lipid Panel No results found for: CHOL, TRIG, HDL, CHOLHDL, VLDL, LDLCALC, LDLDIRECT  Additional studies/ records that were reviewed today include:   Echocardiogram: 03/2018 Study Conclusions  - Left ventricle: The cavity size was normal. Wall thickness was   normal. Systolic function was normal. The estimated ejection   fraction was in the range of 60% to 65%. Indeterminant diastolic   function. - Aortic valve: Mildly calcified annulus. Mildly thickened   leaflets. - Technically difficult study. Echocontrast was used to enhance   visualization.  Coronary CT: 06/08/2018 IMPRESSION: 1.  Situs inversus totalis.  2. Coronary artery calcium score 81 Agatston units. This places the patient in the 51st percentile for age and gender, suggesting intermediate risk for future cardiac events.  3. The LAD is small in caliber with mixed plaque in the proximal to mid portion of the vessel. Cannot rule out moderate stenosis, will send for FFR.  FINDINGS: FFR 0.76 in the mid LAD.  IMPRESSION: Suspect hemodynamically significant proximal LAD stenosis.    Assessment:    1. Chronic diastolic heart failure ([place])   2. Coronary artery disease involving native coronary artery of native heart without angina pectoris   3. Situs inversus totalis   4. Restrictive lung disease   5. Essential hypertension   6. Hyperlipidemia LDL goal <70      Plan:   In order of problems listed above:  1. Chronic Diastolic CHF - He reports overall doing well from a cardiac perspective and says that his respiratory status has been at baseline and he has been able to wean down his oxygen supplementation at home. Weight has continued to decline on his home  scales. He has trace edema on examination but no rales are appreciated and JVD is not elevated.  - Would continue with Lasix at current dosing of 60mg  in AM/40mg  in PM. Labs were recently checked on 07/19/2018 and showed that creatinine was stable at 0.85 with K+ of 4.6. Sodium and fluid restriction reviewed with the patient.  2. CAD - Recent Coronary CT showed the LAD was small in caliber with mixed plaque in the proximal to midportion of the vessel and was significant by FFR at 0.76. - Reviewed findings with the patient today and he denies any recent chest pain and reports his respiratory status is the best that it has been in several months. He wishes to hold off on additional cardiac testing at this time. Will make Dr. Harl Bowie aware but if he is overall stable and denies any recent anginal symptoms, would plan to continue with medical management for now. Reviewed we could pursue a cath in the future if he develops anginal symptoms.  - continue ASA, statin, and BB therapy.   3. Situs Inversus Totalis - This has been added to the patient's past medical history that way future providers are aware in case additional cardiac testing is pursued in the future.  4. Restrictive Lung Diease - the patient has known Kartagener's Syndrome with bronchiectasis which is followed by Dr. Luan Pulling.  Reports that his respiratory status has overall been stable.  5. HTN - BP is well controlled at 130/62 during today's visit. Continue Losartan 100 mg daily and Lopressor 12.5 mg twice daily.  6. HLD - Followed by PCP. Goal LDL is less than 70 in the setting of known  coronary calcifications. He remains on Crestor 40 mg daily.   Medication Adjustments/Labs and Tests Ordered: Current medicines are reviewed at length with the patient today.  Concerns regarding medicines are outlined above.  Medication changes, Labs and Tests ordered today are listed in the Patient Instructions below. Patient Instructions  Medication  Instructions:  Your physician recommends that you continue on your current medications as directed. Please refer to the Current Medication list given to you today.  If you need a refill on your cardiac medications before your next appointment, please call your pharmacy.   Lab work: none If you have labs (blood work) drawn today and your tests are completely normal, you will receive your results only by: Marland Kitchen MyChart Message (if you have MyChart) OR . A paper copy in the mail If you have any lab test that is abnormal or we need to change your treatment, we will call you to review the results.  Testing/Procedures: none  Follow-Up: At [facility], you and your health needs are our priority.  As part of our continuing mission to provide you with exceptional heart care, we have created designated Provider Care Teams.  These Care Teams include your primary Cardiologist (physician) and Advanced Practice Providers (APPs -  Physician Assistants and Nurse Practitioners) who all work together to provide you with the care you need, when you need it. You will need a follow up appointment in 2 months.  Please call our office 2 months in advance to schedule this appointment.  You may see Carlyle Dolly, MD or one of the following Advanced Practice Providers on your designated Care Team:   Bernerd Pho, PA-C [facility]) . Ermalinda Barrios, PA-C ([place])  Any Other Special Instructions Will Be Listed Below (If Applicable). none      Signed, Erma Heritage, PA-C  07/27/2018 4:26 PM    [facility] [address] [place], [place] [address] Phone: [phone]

## 2018-07-27 NOTE — Patient Instructions (Signed)
Medication Instructions:  Your physician recommends that you continue on your current medications as directed. Please refer to the Current Medication list given to you today.  If you need a refill on your cardiac medications before your next appointment, please call your pharmacy.   Lab work: none If you have labs (blood work) drawn today and your tests are completely normal, you will receive your results only by: Marland Kitchen MyChart Message (if you have MyChart) OR . A paper copy in the mail If you have any lab test that is abnormal or we need to change your treatment, we will call you to review the results.  Testing/Procedures: none  Follow-Up: At [facility] you and your health needs are our priority.  As part of our continuing mission to provide you with exceptional heart care, we have created designated Provider Care Teams.  These Care Teams include your primary Cardiologist (physician) and Advanced Practice Providers (APPs -  Physician Assistants and Nurse Practitioners) who all work together to provide you with the care you need, when you need it. You will need a follow up appointment in 2 months.  Please call our office 2 months in advance to schedule this appointment.  You may see Carlyle Dolly, MD or one of the following Advanced Practice Providers on your designated Care Team:   Bernerd Pho, PA-C [facility]) . Ermalinda Barrios, PA-C ([place])  Any Other Special Instructions Will Be Listed Below (If Applicable). none

## 2018-08-27 ENCOUNTER — Inpatient Hospital Stay (HOSPITAL_COMMUNITY): Payer: 59 | Attending: Hematology

## 2018-08-27 DIAGNOSIS — I10 Essential (primary) hypertension: Secondary | ICD-10-CM | POA: Diagnosis not present

## 2018-08-27 DIAGNOSIS — J4540 Moderate persistent asthma, uncomplicated: Secondary | ICD-10-CM | POA: Diagnosis not present

## 2018-08-27 DIAGNOSIS — I5032 Chronic diastolic (congestive) heart failure: Secondary | ICD-10-CM | POA: Diagnosis not present

## 2018-08-27 DIAGNOSIS — K5900 Constipation, unspecified: Secondary | ICD-10-CM | POA: Insufficient documentation

## 2018-08-27 DIAGNOSIS — D72829 Elevated white blood cell count, unspecified: Secondary | ICD-10-CM | POA: Insufficient documentation

## 2018-08-27 DIAGNOSIS — E119 Type 2 diabetes mellitus without complications: Secondary | ICD-10-CM | POA: Diagnosis not present

## 2018-08-27 DIAGNOSIS — K219 Gastro-esophageal reflux disease without esophagitis: Secondary | ICD-10-CM | POA: Diagnosis not present

## 2018-08-27 DIAGNOSIS — I2510 Atherosclerotic heart disease of native coronary artery without angina pectoris: Secondary | ICD-10-CM | POA: Insufficient documentation

## 2018-08-27 DIAGNOSIS — J449 Chronic obstructive pulmonary disease, unspecified: Secondary | ICD-10-CM | POA: Diagnosis not present

## 2018-08-27 DIAGNOSIS — Z9981 Dependence on supplemental oxygen: Secondary | ICD-10-CM | POA: Insufficient documentation

## 2018-08-27 DIAGNOSIS — Z7982 Long term (current) use of aspirin: Secondary | ICD-10-CM | POA: Insufficient documentation

## 2018-08-27 DIAGNOSIS — F329 Major depressive disorder, single episode, unspecified: Secondary | ICD-10-CM | POA: Insufficient documentation

## 2018-08-27 DIAGNOSIS — D500 Iron deficiency anemia secondary to blood loss (chronic): Secondary | ICD-10-CM | POA: Diagnosis present

## 2018-08-27 DIAGNOSIS — G629 Polyneuropathy, unspecified: Secondary | ICD-10-CM | POA: Diagnosis not present

## 2018-08-27 DIAGNOSIS — Z79899 Other long term (current) drug therapy: Secondary | ICD-10-CM | POA: Insufficient documentation

## 2018-08-27 DIAGNOSIS — I471 Supraventricular tachycardia: Secondary | ICD-10-CM | POA: Diagnosis not present

## 2018-08-27 LAB — COMPREHENSIVE METABOLIC PANEL
ALK PHOS: 39 U/L (ref 38–126)
ALT: 13 U/L (ref 0–44)
AST: 17 U/L (ref 15–41)
Albumin: 4.2 g/dL (ref 3.5–5.0)
Anion gap: 9 (ref 5–15)
BUN: 12 mg/dL (ref 8–23)
CHLORIDE: 98 mmol/L (ref 98–111)
CO2: 31 mmol/L (ref 22–32)
CREATININE: 0.9 mg/dL (ref 0.61–1.24)
Calcium: 9.7 mg/dL (ref 8.9–10.3)
GFR calc Af Amer: >60 mL/min (ref >60)
GFR calc non Af Amer: >60 mL/min (ref >60)
Glucose, Bld: 162 mg/dL — ABNORMAL HIGH (ref 70–99)
Potassium: 4.9 mmol/L (ref 3.5–5.1)
SODIUM: 138 mmol/L (ref 135–145)
Total Bilirubin: 0.5 mg/dL (ref 0.3–1.2)
Total Protein: 7.6 g/dL (ref 6.5–8.1)

## 2018-08-27 LAB — CBC WITH DIFFERENTIAL/PLATELET
ABS IMMATURE GRANULOCYTES: 0.04 10*3/uL (ref 0.00–0.07)
Basophils Absolute: 0 10*3/uL (ref 0.0–0.1)
Basophils Relative: 0 %
Eosinophils Absolute: 0.1 10*3/uL (ref 0.0–0.5)
Eosinophils Relative: 1 %
HCT: 38.3 % — ABNORMAL LOW (ref 39.0–52.0)
HEMOGLOBIN: 11.3 g/dL — AB (ref 13.0–17.0)
IMMATURE GRANULOCYTES: 0 %
LYMPHS PCT: 15 %
Lymphs Abs: 1.8 10*3/uL (ref 0.7–4.0)
MCH: 27.8 pg (ref 26.0–34.0)
MCHC: 29.5 g/dL — ABNORMAL LOW (ref 30.0–36.0)
MCV: 94.1 fL (ref 80.0–100.0)
MONOS PCT: 7 %
Monocytes Absolute: 0.8 10*3/uL (ref 0.1–1.0)
NEUTROS ABS: 9 10*3/uL — AB (ref 1.7–7.7)
NEUTROS PCT: 77 %
PLATELETS: 460 10*3/uL — AB (ref 150–400)
RBC: 4.07 MIL/uL — ABNORMAL LOW (ref 4.22–5.81)
RDW: 13.7 % (ref 11.5–15.5)
WBC: 11.7 10*3/uL — ABNORMAL HIGH (ref 4.0–10.5)
nRBC: 0 % (ref 0.0–0.2)

## 2018-08-27 LAB — FOLATE: Folate: 86.5 ng/mL (ref >5.9)

## 2018-08-27 LAB — VITAMIN B12: Vitamin B-12: 429 pg/mL (ref 180–914)

## 2018-08-27 LAB — FERRITIN: Ferritin: 72 ng/mL (ref 24–336)

## 2018-08-27 LAB — LACTATE DEHYDROGENASE: LDH: 105 U/L (ref 98–192)

## 2018-08-29 ENCOUNTER — Encounter (HOSPITAL_COMMUNITY): Payer: Self-pay | Admitting: Internal Medicine

## 2018-08-29 ENCOUNTER — Inpatient Hospital Stay (HOSPITAL_BASED_OUTPATIENT_CLINIC_OR_DEPARTMENT_OTHER): Payer: 59 | Admitting: Internal Medicine

## 2018-08-29 ENCOUNTER — Encounter: Payer: Self-pay | Admitting: Neurology

## 2018-08-29 ENCOUNTER — Other Ambulatory Visit: Payer: Self-pay

## 2018-08-29 VITALS — BP 130/50 | HR 77 | Temp 98.4°F | Resp 20 | Wt 252.1 lb

## 2018-08-29 DIAGNOSIS — G629 Polyneuropathy, unspecified: Secondary | ICD-10-CM

## 2018-08-29 DIAGNOSIS — I5032 Chronic diastolic (congestive) heart failure: Secondary | ICD-10-CM | POA: Diagnosis not present

## 2018-08-29 DIAGNOSIS — J4540 Moderate persistent asthma, uncomplicated: Secondary | ICD-10-CM

## 2018-08-29 DIAGNOSIS — I2510 Atherosclerotic heart disease of native coronary artery without angina pectoris: Secondary | ICD-10-CM

## 2018-08-29 DIAGNOSIS — I471 Supraventricular tachycardia: Secondary | ICD-10-CM

## 2018-08-29 DIAGNOSIS — J449 Chronic obstructive pulmonary disease, unspecified: Secondary | ICD-10-CM

## 2018-08-29 DIAGNOSIS — K5900 Constipation, unspecified: Secondary | ICD-10-CM

## 2018-08-29 DIAGNOSIS — D500 Iron deficiency anemia secondary to blood loss (chronic): Secondary | ICD-10-CM | POA: Diagnosis not present

## 2018-08-29 DIAGNOSIS — Z79899 Other long term (current) drug therapy: Secondary | ICD-10-CM

## 2018-08-29 DIAGNOSIS — D72829 Elevated white blood cell count, unspecified: Secondary | ICD-10-CM | POA: Diagnosis not present

## 2018-08-29 DIAGNOSIS — E119 Type 2 diabetes mellitus without complications: Secondary | ICD-10-CM

## 2018-08-29 DIAGNOSIS — F329 Major depressive disorder, single episode, unspecified: Secondary | ICD-10-CM

## 2018-08-29 DIAGNOSIS — I10 Essential (primary) hypertension: Secondary | ICD-10-CM

## 2018-08-29 DIAGNOSIS — Z9981 Dependence on supplemental oxygen: Secondary | ICD-10-CM

## 2018-08-29 DIAGNOSIS — K219 Gastro-esophageal reflux disease without esophagitis: Secondary | ICD-10-CM

## 2018-08-29 DIAGNOSIS — Z7982 Long term (current) use of aspirin: Secondary | ICD-10-CM

## 2018-08-29 NOTE — Patient Instructions (Signed)
[facility]  Discharge Instructions: You saw Dr. Higgs today                               _______________________________________________________________  Thank you for choosing [facility] to provide your oncology and hematology care.  To afford each patient quality time with our providers, please arrive at least 15 minutes before your scheduled appointment.  You need to re-schedule your appointment if you arrive 10 or more minutes late.  We strive to give you quality time with our providers, and arriving late affects you and other patients whose appointments are after yours.  Also, if you no show three or more times for appointments you may be dismissed from the clinic.  Again, thank you for choosing [facility]. Our hope is that these requests will allow you access to exceptional care and in a timely manner. _______________________________________________________________  If you have questions after your visit, please contact our office at [phone] between the hours of 8:30 a.m. and 5:00 p.m. Voicemails left after 4:30 p.m. will not be returned until the following business day. _______________________________________________________________  For prescription refill requests, have your pharmacy contact our office. _______________________________________________________________  Recommendations made by the consultant and any test results will be sent to your referring physician. _______________________________________________________________ 

## 2018-08-29 NOTE — Progress Notes (Signed)
Diagnosis Iron deficiency anemia due to chronic blood loss - Plan: CBC with Differential/Platelet, Comprehensive metabolic panel, Lactate dehydrogenase, Ferritin  Staging Cancer Staging No matching staging information was found for the patient.  Assessment and Plan:  1.  IDA.   Pt was last treated with IV iron 04/2018.    Labs done 08/27/2018 reviewed and showed WBC 11.7 HB 11.3 plts 460,000.  Chemistries WNL with K+ 4.9 Cr 0.9 and normal LFTs.  Ferritin WNL at 72.  Pt will be recommended for transfusion if HB < 7.  SPEP negative on labs done 06/2018.  Counts remain adequate and he will RTC in 12/2018 for follow-up and labs.    2.  Leukocytosis and thrombocytosis.  White count stable at 11.7.  Pt has undergone myeloproliferative workup in the past with Dr. Talbert Cage which was negative.  Pt will have repeat labs on RTC  12/2018.    3.  Oxygen therapy.  Pt is on home O2. He desired portable oxygen instead of oxygen tank.   Our office facilitated pt getting portable oxygen.  Follow-up with Dr. Luan Pulling of pulmonary as recommended.    4  HTN.  BP is 130/50.  Follow-up with PCP.    5.  CAD.  Pt reports he was told he has blockage on heart.  Follow-up with cardiology as directed.    6.  Health maintenance.  Follow with GI as recommended.  Interval History: 67 year old male with iron deficiency anemia.   Current Status:  Patient is seen today for follow-up.  He is here to go over labs.    Problem List Patient Active Problem List   Diagnosis Date Noted  . Constipation [K59.00] 07/19/2018  . 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 Chronic respiratory failure with hypoxia ([place]) [J96.11] 07/16/2018  . HTN (hypertension) [I10] 07/16/2018  . Acute on chronic respiratory failure with hypoxia ([place]) [J96.21] 04/21/2018  . COPD (chronic obstructive pulmonary disease) ([place]) [J44.9] 04/21/2018  . SVT (supraventricular tachycardia) ([place]) [I47.1] 04/14/2018  . Paroxysmal SVT (supraventricular  tachycardia) ([place]) [I47.1] 04/14/2018  . Chronic diastolic CHF (congestive heart failure) ([place]) [I50.32] 04/14/2018  . CHF exacerbation ([place]) [I50.9] 04/10/2018  . 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 Moderate persistent asthma, uncomplicated [D62.22] 97/98/9211  . Thrombocytosis ([place]) [D47.3] 10/09/2014  . Abdominal pain [R10.9] 09/23/2014  . Gastritis, Helicobacter pylori [H41.74, B96.81] 01/03/2014  . 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 GERD (gastroesophageal reflux disease) [K21.9] 10/18/2012  . Nausea [R11.0] 10/18/2012  . Iron deficiency anemia due to chronic blood loss [D50.0] 10/18/2012  . HIP PAIN [M25.559] 08/31/2010  . SPINAL STENOSIS [M48.00] 06/28/2010  . TENDINITIS, CALCIFIC, SHOULDER, RIGHT [M75.30] 06/14/2010  . IMPINGEMENT SYNDROME [M75.80] 06/14/2010    Past Medical History Past Medical History:  Diagnosis Date  . Depression   . Dextrocardia   . Diabetes mellitus   . H. pylori infection 11/09/2012   treated with pylera  . HTN (hypertension)    Cholesterol  . Iron deficiency anemia, unspecified 10/18/2012  . Neuropathy   . Pneumonia   . Situs inversus totalis   . Tachycardia     Past Surgical History Past Surgical History:  Procedure Laterality Date  . CATARACT EXTRACTION, BILATERAL  2016  . COLONOSCOPY WITH ESOPHAGOGASTRODUODENOSCOPY (EGD) N/A 11/09/2012   Dr. Gala Romney- EGD-normal esophagus, reversed stomach c/w situs inversus (with dextrocardia query  kartagener syndrome.) gastric erosions. hpylori on bx- treated with pylera. TCS- normal rectum. 1 diminutive polyp in the mid descending segment. 1-41mm polyp in the mid desending segment o/w the remainder of the colonic mucosa appeared normal. tubular adenoma on bx  . GIVENS CAPSULE STUDY N/A 10/07/2013   no source  for anemia or heme positive stool noted  . NECK SURGERY     bone spurs    Family History Family History  Problem Relation Age of Onset  . Diabetes Sister        Fx  . Heart defect Sister        Fx  . Arthritis Sister        Fx  . Asthma Sister        Fx  . Kidney disease Sister        Fx  . Pancreatitis Sister      Social History  reports that he has never smoked. His smokeless tobacco use includes chew. He reports that he does not drink alcohol or use drugs.  Medications  Current Outpatient Medications:  .  ferrous sulfate 325 (65 FE) MG tablet, Take 325 mg by mouth daily with breakfast., Disp: , Rfl:  .  albuterol (PROAIR HFA) 108 (90 Base) MCG/ACT inhaler, Inhale 2 puffs into the lungs every 4 (four) hours as needed for wheezing or shortness of breath., Disp: 1 Inhaler, Rfl: 2 .  aspirin EC 81 MG tablet, Take 81 mg by mouth daily., Disp: , Rfl:  .  B-D ULTRAFINE III SHORT PEN 31G X 8 MM MISC, , Disp: , Rfl:  .  budesonide-formoterol (SYMBICORT) 160-4.5 MCG/ACT inhaler, Inhale 2 puffs into the lungs 2 (two) times daily. (Patient taking differently: Inhale 2 puffs into the lungs daily as needed (for shortness of breath). ), Disp: 1 Inhaler, Rfl: 5 .  DEXILANT 60 MG capsule, TAKE (1) CAPSULE BY MOUTH EVERY DAY. (Patient taking differently: Take 60 mg by mouth daily. ), Disp: 90 capsule, Rfl: 3 .  DULoxetine (CYMBALTA) 60 MG capsule, Take 60 mg by mouth daily., Disp: , Rfl:  .  fenofibrate (TRICOR) 145 MG tablet, Take 145 mg by mouth every evening. , Disp: , Rfl:  .  ferrous sulfate 325 (65 FE) MG tablet, Take 1 tablet (325 mg total) by mouth 2 (two) times daily with a meal., Disp: 60 tablet, Rfl: 0 .  folic acid (FOLVITE) 1 MG tablet, Take 1 mg by mouth daily., Disp: , Rfl:  .  furosemide (LASIX) 40 MG tablet, Take 60 mg in the morning, take 40 mg in the evening. (Patient taking differently: Take 40-60 mg by mouth See admin instructions. Take 60 mg in the morning, take 40 mg in  the evening.), Disp: 225 tablet, Rfl: 3 .  gabapentin (NEURONTIN) 600 MG tablet, Take 1 tablet (600 mg total) by mouth 2 (two) times daily. (Patient taking differently: Take 600 mg by mouth 3 (three) times daily. ), Disp: 20 tablet, Rfl: 0 .  insulin detemir (LEVEMIR) 100 UNIT/ML injection, Inject 30-60 Units into the skin See admin instructions. 30 units in the morning and 60 units at bedtime, Disp: , Rfl:  .  losartan (COZAAR) 100 MG tablet, Take 100 mg by mouth daily., Disp: , Rfl:  .  Magnesium 400 MG TABS, Take 400 mg by mouth daily. , Disp: , Rfl:  .  meloxicam (MOBIC) 7.5 MG tablet, Take 2 tablets (15 mg total) by mouth daily as needed for pain., Disp: 7 tablet, Rfl: 0 .  metFORMIN (GLUCOPHAGE) 1000 MG tablet, Take 1,000 mg by mouth 2 (two) times daily with a meal., Disp: , Rfl:  .  metoprolol tartrate (LOPRESSOR) 25 MG tablet, Take 0.5 tablets (12.5 mg total) by mouth 2 (two) times daily., Disp: 60 tablet, Rfl: 1 .  Multiple Vitamin (MULTIVITAMIN) tablet, Take 1 tablet by mouth every morning. , Disp: , Rfl:  .  OXYGEN, Inhale 3-4 L into the lungs continuous., Disp: , Rfl:  .  pioglitazone (ACTOS) 45 MG tablet, Take 45 mg by mouth daily., Disp: , Rfl:  .  polyethylene glycol (MIRALAX / GLYCOLAX) packet, Take 17 g by mouth daily., Disp: 14 each, Rfl: 0 .  rOPINIRole (REQUIP) 2 MG tablet, Take 2 mg by mouth at bedtime. , Disp: , Rfl:  .  rosuvastatin (CRESTOR) 40 MG tablet, Take 1 tablet by mouth every evening., Disp: , Rfl:  .  sitaGLIPtin (JANUVIA) 100 MG tablet, Take 100 mg by mouth daily. , Disp: , Rfl:  .  traMADol (ULTRAM) 50 MG tablet, , Disp: , Rfl:  .  traZODone (DESYREL) 50 MG tablet, Take 50 mg by mouth at bedtime. , Disp: , Rfl:   Allergies Patient has no known allergies.  Review of Systems Review of Systems - Oncology ROS negative   Physical Exam  Vitals Wt Readings from Last 3 Encounters:  08/29/18 252 lb 1.6 oz (114.4 kg)  07/27/18 256 lb (116.1 kg)  07/16/18 270  lb (122.5 kg)   Temp Readings from Last 3 Encounters:  08/29/18 98.4 F (36.9 C) (Oral)  07/19/18 98.1 F (36.7 C) (Oral)  07/13/18 (!) 97.5 F (36.4 C) (Oral)   BP Readings from Last 3 Encounters:  08/29/18 (!) 130/50  07/27/18 130/62  07/19/18 (!) 148/86   Pulse Readings from Last 3 Encounters:  08/29/18 77  07/27/18 84  07/19/18 88   Constitutional: Well-developed, well-nourished, and in no distress.  Oxygen via [place].   HENT: Head: Normocephalic and atraumatic.  Mouth/Throat: No oropharyngeal exudate. Mucosa moist. Eyes: Pupils are equal, round, and reactive to light. Conjunctivae are normal. No scleral icterus.  Neck: Normal range of motion. Neck supple. No JVD present.  Cardiovascular: Normal rate, regular rhythm and normal heart sounds.  Exam reveals no gallop and no friction rub.   No murmur heard. Pulmonary/Chest: Coarse BS.   Abdominal: Soft. Bowel sounds are normal. No distension. There is no tenderness. There is no guarding.  Musculoskeletal: No edema or tenderness.  Lymphadenopathy: No cervical, axillary or supraclavicular adenopathy.  Neurological: Alert and oriented to person, place, and time. No cranial nerve deficit.  Skin: Skin is warm and dry. No rash noted. No erythema. No pallor.  Psychiatric: Affect and judgment normal.   Labs Appointment on 08/27/2018  Component Date Value Ref Range Status  . WBC 08/27/2018 11.7* 4.0 - 10.5 K/uL Final  . RBC 08/27/2018 4.07* 4.22 - 5.81 MIL/uL Final  . Hemoglobin 08/27/2018 11.3* 13.0 - 17.0 g/dL Final  . HCT 08/27/2018 38.3* 39.0 - 52.0 % Final  . MCV 08/27/2018 94.1  80.0 - 100.0 fL Final  . MCH 08/27/2018 27.8  26.0 - 34.0 pg Final  . MCHC 08/27/2018 29.5* 30.0 - 36.0 g/dL Final  . RDW 08/27/2018 13.7  11.5 - 15.5 % Final  . Platelets 08/27/2018 460* 150 - 400 K/uL Final  . nRBC 08/27/2018 0.0  0.0 - 0.2 % Final  . Neutrophils Relative % 08/27/2018 77  % Final  . Neutro Abs 08/27/2018 9.0* 1.7 - 7.7 K/uL  Final   . Lymphocytes Relative 08/27/2018 15  % Final  . Lymphs Abs 08/27/2018 1.8  0.7 - 4.0 K/uL Final  . Monocytes Relative 08/27/2018 7  % Final  . Monocytes Absolute 08/27/2018 0.8  0.1 - 1.0 K/uL Final  . Eosinophils Relative 08/27/2018 1  % Final  . Eosinophils Absolute 08/27/2018 0.1  0.0 - 0.5 K/uL Final  . Basophils Relative 08/27/2018 0  % Final  . Basophils Absolute 08/27/2018 0.0  0.0 - 0.1 K/uL Final  . Immature Granulocytes 08/27/2018 0  % Final  . Abs Immature Granulocytes 08/27/2018 0.04  0.00 - 0.07 K/uL Final   Performed at [facility], [address], [place], [place] [address]  . Sodium 08/27/2018 138  135 - 145 mmol/L Final  . Potassium 08/27/2018 4.9  3.5 - 5.1 mmol/L Final  . Chloride 08/27/2018 98  98 - 111 mmol/L Final  . CO2 08/27/2018 31  22 - 32 mmol/L Final  . Glucose, Bld 08/27/2018 162* 70 - 99 mg/dL Final  . BUN 08/27/2018 12  8 - 23 mg/dL Final  . Creatinine, Ser 08/27/2018 0.90  0.61 - 1.24 mg/dL Final  . Calcium 08/27/2018 9.7  8.9 - 10.3 mg/dL Final  . Total Protein 08/27/2018 7.6  6.5 - 8.1 g/dL Final  . Albumin 08/27/2018 4.2  3.5 - 5.0 g/dL Final  . AST 08/27/2018 17  15 - 41 U/L Final  . ALT 08/27/2018 13  0 - 44 U/L Final  . Alkaline Phosphatase 08/27/2018 39  38 - 126 U/L Final  . Total Bilirubin 08/27/2018 0.5  0.3 - 1.2 mg/dL Final  . GFR calc non Af Amer 08/27/2018 >60  >60 mL/min Final  . GFR calc Af Amer 08/27/2018 >60  >60 mL/min Final  . Anion gap 08/27/2018 9  5 - 15 Final   Performed at [facility], [address], [place], [place] [address]  . LDH 08/27/2018 105  98 - 192 U/L Final   Performed at [facility], [address] [place], [place] [address]  . Ferritin 08/27/2018 72  24 - 336 ng/mL Final   Performed at [facility], [address], [place], [place] [address]  . Vitamin B-12 08/27/2018 429  180 - 914 pg/mL Final   Comment: (NOTE) This assay is not validated for testing neonatal or myeloproliferative syndrome specimens for  Vitamin B12 levels. Performed at [facility], [address] [place], [place] [address]   . Folate 08/27/2018 86.5  >5.9 ng/mL Final   Comment: RESULTS CONFIRMED BY MANUAL DILUTION Performed at [facility], [address] [place], [place] [address]      Pathology Orders Placed This Encounter  Procedures  . CBC with Differential/Platelet    Standing Status:   Future    Standing Expiration Date:   08/29/2020  . Comprehensive metabolic panel    Standing Status:   Future    Standing Expiration Date:   08/29/2020  . Lactate dehydrogenase    Standing Status:   Future    Standing Expiration Date:   08/29/2020  . Ferritin    Standing Status:   Future    Standing Expiration Date:   08/29/2020       Zoila Shutter MD

## 2018-08-30 ENCOUNTER — Encounter: Payer: Self-pay | Admitting: Neurology

## 2018-08-30 ENCOUNTER — Ambulatory Visit (INDEPENDENT_AMBULATORY_CARE_PROVIDER_SITE_OTHER): Payer: 59 | Admitting: Neurology

## 2018-08-30 VITALS — BP 126/63 | HR 68 | Ht 66.5 in | Wt 251.0 lb

## 2018-08-30 DIAGNOSIS — E119 Type 2 diabetes mellitus without complications: Secondary | ICD-10-CM | POA: Diagnosis not present

## 2018-08-30 DIAGNOSIS — Z9981 Dependence on supplemental oxygen: Secondary | ICD-10-CM

## 2018-08-30 DIAGNOSIS — R0601 Orthopnea: Secondary | ICD-10-CM

## 2018-08-30 DIAGNOSIS — D500 Iron deficiency anemia secondary to blood loss (chronic): Secondary | ICD-10-CM

## 2018-08-30 DIAGNOSIS — J441 Chronic obstructive pulmonary disease with (acute) exacerbation: Secondary | ICD-10-CM

## 2018-08-30 DIAGNOSIS — Z794 Long term (current) use of insulin: Secondary | ICD-10-CM

## 2018-08-30 DIAGNOSIS — I5032 Chronic diastolic (congestive) heart failure: Secondary | ICD-10-CM

## 2018-08-30 DIAGNOSIS — I2510 Atherosclerotic heart disease of native coronary artery without angina pectoris: Secondary | ICD-10-CM

## 2018-08-30 DIAGNOSIS — R063 Periodic breathing: Secondary | ICD-10-CM | POA: Diagnosis not present

## 2018-08-30 DIAGNOSIS — F1190 Opioid use, unspecified, uncomplicated: Secondary | ICD-10-CM

## 2018-08-30 NOTE — Progress Notes (Signed)
SLEEP MEDICINE CLINIC  This Consultation is to address Organic Medical Causes of Sleep Disorders.    Provider:  Larey Seat, MD    Primary Care Physician:  [name], MD  Referring Provider: NP Gaye Alken.   Chief Complaint  Patient presents with  . New Patient (Initial Visit)    pt with nephew, rm 62. pt presents today he has been having sleeping problems. he started some medication and he still has problems with waking up 4-5 times a night and has a hard time going back to sleep.  He reports that before he started the sleeping medication he would only get 2 hrs of sleep if that. He has been told he snores in sleep, states he has woken up stuggling to get air. he used to have a lot of sinus issues at night. was placed on 2L oxygen all the time in April 2019 due to Hospital evaluation- Dr Harl Bowie , cardiologist. PNA.     HPI:  GEN CLAGG is a 67 y.o. caucasian male patient of dr Maudie Mercury, who was referred by the [facility] clinic in [place] , seen  Also by dr Luan Pulling, Dr Harl Bowie and NP Quintella Reichert. He is here on 08-30-2018  in a referral  from Dr. Maudie Mercury for evaluation of possible sleep apnea associated with his medication regimen, in the setting of poor sleep habits, COPD, CHF, CAD,  opiates ,Nocturia  and oxygen needs.   Mr. Nthony Lefferts. Moscato, a 67 year old Caucasian right-handed gentleman is now walking with a single-point cane, he is visibly short of breath and is using oxygen 24/7.  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  Due to his progression and diabetic neuropathy and cardiac disease as well as congestive heart failure with lung impairment he was no longer able to work in his employment.  He reports that he sleeps very poorly at night and that he is often gasping for air but not in oxygen.  He cannot go without his oxygen.  He has also back pain and has received steroid epidural injections but has not had the  desired success.  He had a total of 4 injections this year.  He is a left-handed individual he remains on tramadol 50 mg for back pain, he denies fever sweats chills or weakness, he has a very dysphonic voice but reports no dysphagia to be present.  I reviewed his current medications, he was referred for insomnia unspecified.  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  Chief complaint according to patient : "I just can't sleep"- now with oxygen and sleeping pill I have better sleep, still wakes 4-5 times at night and goes to urinate"   Sleep habits are as follows: suppertime is between 5-6.30 Pm, he takes a sleep aid at 8 Pm and goes to bed by 9, falls asleep in his recliner (ORTHOPNEA ) by 10 PM. The recliner is in the living room, but he assured me he switched the TV off.   He can sleep 2-3 hours until the first nocturia, and than takes another sleep aid if needed.  He sleeps another hours between all following urge to urinate.  Wife leaves at 5 AM to work and he gets another 2-3 hours solid after that.  Wakes at 7-8 AM- estimated sleep time is 5-6 nocturnal sleep hours.    Sleep Medical history: see above   Family sleep history: no family history of OSA but sister needs oxygen at night, COPD, only started Sept 2019. Nephew is here to drive him to the MD appointments.   Social history: disabled, married, no children-  tobacco chewer, non smoker- No ETOH use, caffeine -  coke zero 5 a day. No iced tea.    Review of Systems: Out of a complete 14 system review, the patient complains of only the following symptoms, and all other reviewed systems are negative.dysphonia, SOB,  hoarseness, obesity, hard of hearing. 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   Epworth score 3 points only ;  Fatigue severity score: 17/ 63   , depression score ; 7/ 15 points -" I am frustrated, I am soo tired of being held down." Snoring loudly, while sleeping in recliner , orthopnoea.   Social History   Socioeconomic History  . Marital status: Married    Spouse name: Not on file  . Number of children: 0  . Years of education: 7th grade   . Highest education level: Not on file  Occupational History  . Occupation: Lexicographer  . Financial resource strain: Not on file  . Food insecurity:    Worry: Not on file    Inability: Not on file  . Transportation needs:    Medical: Not on file    Non-medical: Not on file  Tobacco Use  . Smoking status: Never Smoker  . Smokeless tobacco: Current User    Types: Chew  Substance and Sexual Activity  . Alcohol use: No  . Drug use: No  . Sexual activity: Not on file  Lifestyle  . Physical activity:    Days per week: Not on file    Minutes per session: Not on file  . Stress: Not on file  Relationships  . Social connections:    Talks on phone: Not on file    Gets together: Not on file    Attends religious service: Not on file    Active member of club or organization: Not on file    Attends meetings of clubs or organizations: Not on file    Relationship status: Not on file  . Intimate partner violence:    Fear of current or ex partner: Not on file    Emotionally abused: Not on file    Physically abused: Not on file    Forced sexual activity: Not on file  Other Topics Concern  . Not on file  Social History Narrative  . Not on file    Family History  Problem Relation Age of Onset  . Heart attack Mother   . Diabetes Sister        Fx  . Heart defect Sister        Fx  . Arthritis Sister        Fx  . Asthma Sister        Fx  . Kidney disease Sister        Fx  . Pancreatitis Sister     Past Medical History:  Diagnosis Date    . Depression   . Dextrocardia   . Diabetes mellitus   . H. pylori infection 11/09/2012   treated with pylera  . HTN (hypertension)    Cholesterol  .  Iron deficiency anemia, unspecified 10/18/2012  . Neuropathy   . Pneumonia   . Situs inversus totalis   . Tachycardia     Past Surgical History:  Procedure Laterality Date  . CATARACT EXTRACTION, BILATERAL  2016  . COLONOSCOPY WITH ESOPHAGOGASTRODUODENOSCOPY (EGD) N/A 11/09/2012   Dr. 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 hpylori on bx- treated with pylera. TCS- normal rectum. 1 diminutive polyp in the mid descending segment. 1-13mm polyp in the mid desending segment o/w the remainder of the colonic mucosa appeared normal. tubular adenoma on bx  . GIVENS CAPSULE STUDY N/A 10/07/2013   no source for anemia or heme positive stool noted  . NECK SURGERY     bone spurs    Current Outpatient Medications  Medication Sig Dispense Refill  . albuterol (PROAIR HFA) 108 (90 Base) MCG/ACT inhaler Inhale 2 puffs into the lungs every 4 (four) hours as needed for wheezing or shortness of breath. 1 Inhaler 2  . aspirin EC 81 MG tablet Take 81 mg by mouth daily.    . B-D ULTRAFINE III SHORT PEN 31G X 8 MM MISC     . budesonide-formoterol (SYMBICORT) 160-4.5 MCG/ACT inhaler Inhale 2 puffs into the lungs 2 (two) times daily. (Patient taking differently: Inhale 2 puffs into the lungs daily as needed (for shortness of breath). ) 1 Inhaler 5  . DEXILANT 60 MG capsule TAKE (1) CAPSULE BY MOUTH EVERY DAY. (Patient taking differently: Take 60 mg by mouth daily. ) 90 capsule 3  . DULoxetine (CYMBALTA) 60 MG capsule Take 60 mg by mouth daily.    . fenofibrate (TRICOR) 145 MG tablet Take 145 mg by mouth every evening.     . ferrous sulfate 325 (65 FE) MG tablet Take 325 mg by mouth daily with breakfast.    . folic acid (FOLVITE) 1 MG tablet Take 1 mg by mouth daily.    .  furosemide (LASIX) 40 MG tablet Take 60 mg in the morning, take 40 mg in the evening. (Patient taking differently: Take 40-60 mg by mouth See admin instructions. Take 60 mg in the morning, take 40 mg in the evening.) 225 tablet 3  . gabapentin (NEURONTIN) 600 MG tablet Take 1 tablet (600 mg total) by mouth 2 (two) times daily. (Patient taking differently: Take 600 mg by mouth 3 (three) times daily. ) 20 tablet 0  . insulin detemir (LEVEMIR) 100 UNIT/ML injection Inject 30-60 Units into the skin See admin instructions. 30 units in the morning and 60 units at bedtime    . losartan (COZAAR) 100 MG tablet Take 100 mg by mouth daily.    . Magnesium 400 MG TABS Take 400 mg by mouth daily.     . metFORMIN (GLUCOPHAGE) 1000 MG tablet Take 1,000 mg by mouth 2 (two) times daily with a meal.    . metoprolol tartrate (LOPRESSOR) 25 MG tablet Take 0.5 tablets (12.5 mg total) by mouth 2 (two) times daily. 60 tablet 1  . Multiple Vitamin (MULTIVITAMIN) tablet Take 1 tablet by mouth every morning.     . OXYGEN Inhale 3-4 L into the lungs continuous.    . pioglitazone (ACTOS) 45 MG tablet Take 45 mg by mouth daily.    . polyethylene glycol (MIRALAX / GLYCOLAX) packet Take 17 g by mouth daily. 14 each 0  . rOPINIRole (REQUIP) 2 MG tablet Take 2 mg by mouth at bedtime.     . rosuvastatin (CRESTOR) 40 MG  tablet Take 1 tablet by mouth every evening.    . sitaGLIPtin (JANUVIA) 100 MG tablet Take 100 mg by mouth daily.     . traMADol (ULTRAM) 50 MG tablet     . traZODone (DESYREL) 50 MG tablet Take 150 mg by mouth at bedtime.     . ferrous sulfate 325 (65 FE) MG tablet Take 1 tablet (325 mg total) by mouth 2 (two) times daily with a meal. 60 tablet 0   No current facility-administered medications for this visit.     Allergies as of 08/30/2018  . (No Known Allergies)    Vitals: BP 126/63   Pulse 68   Ht 5' 6.5" (1.689 m)   Wt 251 lb (113.9 kg)   SpO2 99%   BMI 39.91 kg/m  Last Weight:  Wt Readings from  Last 1 Encounters:  08/30/18 251 lb (113.9 kg)   VOZ:DGUY mass index is 39.91 kg/m.     Last Height:   Ht Readings from Last 1 Encounters:  08/30/18 5' 6.5" (1.689 m)    Physical exam:  General: The patient is awake, alert and appears  in acute respiratory distress. On oxygen. Non fluent speech due to need to take a breath.   Head: Normocephalic, atraumatic. Neck is supple. Mallampati  5- tongue discoloured from chewing tobacco, only breathing through his mouth.  neck circumference:20" . Nasal airflow congested, runny nose, left nostril is not patent for airflow.   Retrognathia is seen- grade 2 .  Cardiovascular:  Regular rate and rhythm - peripheral pulses palpable. , no murmurs or carotid bruit, and without distended neck veins. Respiratory: mild wheezing in right middle lobe.  Skin:  With evidence of ankle  edema, not  rash Trunk: BMI is 40- The patient's posture is stooped, he is pyknic, barrel chested.    Neurologic exam : The patient is awake and alert, oriented to place and time.   Speech is fluent,  With dysarthria, dysphonia- spasmodic.  Mood and affect are appropriate.  Cranial nerves: Pupils are unequal and almost non reactive to light.  Right pupil disrounded and dilated, not constricting, left is sluggishly reacting. Funduscopic exam with evidence of edema in the right eye.  Extraocular movements in vertical and horizontal planes coarse, saccadic- not smooth.  But not  nystagmus. Visual fields by finger perimetry are restricted.  Hearing to finger rub impaired,  Facial sensation intact to fine touch.  Facial motor strength is symmetric and tongue and uvula move midline. Shoulder shrug was symmetrical.   Motor exam:   Normal tone, muscle bulk and symmetric strength in upper extremities.  Sensory:  deferred Coordination:  Finger-to-nose maneuver bilaterally ab- normal withevidence of ataxia, dysmetria, not  tremor.  Gait and station: Patient walks with a cane for an  assistive device . DTR are attenuated.    Dear Dr. Maudie Mercury,  Assessment:  After physical and neurologic examination, review of laboratory studies,  Personal review of imaging studies, reports of other /same  Imaging studies, results of polysomnography and / or neurophysiology testing and pre-existing records as far as provided in visit., my assessment is ;  1) Mr. Deyonte Cadden. Umscheid has a long list of comorbidities and many will affect the quality of his sleep or his ability to sleep through the night.  He has been diagnosed with COPD-emphysema, he is using inhalers and nebulizers that will increase his tremor, he has been on diabetes medication but had diabetes for over 19 years leading to early cataract formation  and some retinal problems, he has been treated for high blood pressure and tachycardia now controlled on metoprolol, he is on gabapentin for back pain, he is insulin-dependent, he has been taking dopaminergic agonists for control of tremor restless legs, and he is on tramadol for pain as well as a methylprednisone 4 mg.Marland Kitchen  His insomnia is not a surprise looking at his comorbidities especially the chronic respiratory failure and chronic diastolic congestive heart failure leading to orthopnea.  He has COPD and it is at a high grade.   He is oxygen dependent 24/7.  He still has nocturia given that he had poorly controlled diabetes.    My goal for the patient is to undergo an attended sleep study and an adjustable bed.  Since he has orthopnea I would either have to give him a medical grade bed to sleep and or he will sleep in his recliner at home with a home sleep test.  I think we will get the very best data if we can see him in the sleep lab however.  This will need some logistics and his card to be dropped off here at 8 PM and to be picked up in the morning at 6 AM.  I will suggest to do this at the end of January or early February.    The patient was advised of the nature of the diagnosed disorder ,  the treatment options and the  risks for general health and wellness arising from not treating the condition.   I spent more than 60 minutes of face to face time with the patient.  Greater than 50% of time was spent in counseling and coordination of care. We have discussed the diagnosis and differential and I answered the patient's questions.    Plan:  Treatment plan and additional workup :   SPLIT NIGHT study in adjustable bed for patient with orthopnea. Technician to review co morbidities.  Oxygen dependent. Will bring his night time meds.   Plan for late January through February.   Larey Seat, MD 25/42/7062, 37:62 AM  Certified in Neurology by ABPN Certified in [place] by [facility] [address], [place], [place] [address]

## 2018-08-31 LAB — METHYLMALONIC ACID, SERUM: METHYLMALONIC ACID, QUANTITATIVE: 290 nmol/L (ref 0–378)

## 2018-09-10 ENCOUNTER — Other Ambulatory Visit: Payer: Self-pay

## 2018-09-10 MED ORDER — METOPROLOL TARTRATE 25 MG PO TABS: 12.5000 mg | ORAL_TABLET | Freq: Two times a day (BID) | ORAL | 1 refills | Status: DC

## 2018-10-08 ENCOUNTER — Encounter: Payer: Self-pay | Admitting: Cardiology

## 2018-10-08 ENCOUNTER — Ambulatory Visit (INDEPENDENT_AMBULATORY_CARE_PROVIDER_SITE_OTHER): Payer: Medicare Other | Admitting: Cardiology

## 2018-10-08 VITALS — BP 142/60 | HR 70 | Ht 68.0 in | Wt 250.0 lb

## 2018-10-08 DIAGNOSIS — I10 Essential (primary) hypertension: Secondary | ICD-10-CM | POA: Diagnosis not present

## 2018-10-08 DIAGNOSIS — E782 Mixed hyperlipidemia: Secondary | ICD-10-CM

## 2018-10-08 DIAGNOSIS — I5032 Chronic diastolic (congestive) heart failure: Secondary | ICD-10-CM

## 2018-10-08 DIAGNOSIS — I2510 Atherosclerotic heart disease of native coronary artery without angina pectoris: Secondary | ICD-10-CM

## 2018-10-08 NOTE — Progress Notes (Signed)
Clinical Summary Mr. Lefevers is a 68 y.o.male seen today for follow up of the following medical problems.    1. Chronic diastolic HF - 04/5884 echo LVEF 60-65%, indeterminant diastolic function - admisssion 04/12/18 with SOB. Mutlfactorial including diastolic HF but also related to his chronic lung disease,. He was diuresed, also managed with bronchodilators and antibiotics for respiratory infection - readmitted later in 04/2018 with recurrent SOB. Managed again for COPD/bronchiectasis and diastolic HF    - weight down additional 6 lbs since his 07/2018 appt with [place] (256-->250) - takes lasix 60mg  in AM and 40mg  in PM. Has done very well on this dose. Labs stable from last month - breathing much improved, off home O2.   2. Elevated troponin/CAD - mild trop elevation to 0.43 in setting of HF and hypoxia, did not have typical chest pain. Thought likely demand ischemia, plans were to consider possible outpatient stress - echo normal LVEF no WMAs.  05/2018 coronary CTA with mid vessel stenosis, significant by FFR 0.76  - no recent chest pain.     3. Bronchiectasis -followed by pulmonary Dr Luan Pulling.    4. Leg pain - mostly with walking. Dull pain bilateral thighs into knees. Better with rest.  - 06/2018 normal ABIs  5. Palpitations - heart rates up to 130s with palpitations. Overal infrequent episodes, short in duration.   6. Hyperlipidemia - on statin, on fenofibrate from other provider.   Past Medical History:  Diagnosis Date  . Depression   . Dextrocardia   . Diabetes mellitus   . H. pylori infection 11/09/2012   treated with pylera  . HTN (hypertension)    Cholesterol  . Iron deficiency anemia, unspecified 10/18/2012  . Neuropathy   . Pneumonia   . Situs inversus totalis   . Tachycardia      No Known Allergies   Current Outpatient Medications  Medication Sig Dispense Refill  . albuterol (PROAIR HFA) 108 (90 Base) MCG/ACT inhaler Inhale 2 puffs  into the lungs every 4 (four) hours as needed for wheezing or shortness of breath. 1 Inhaler 2  . aspirin EC 81 MG tablet Take 81 mg by mouth daily.    . B-D ULTRAFINE III SHORT PEN 31G X 8 MM MISC     . budesonide-formoterol (SYMBICORT) 160-4.5 MCG/ACT inhaler Inhale 2 puffs into the lungs 2 (two) times daily. (Patient taking differently: Inhale 2 puffs into the lungs daily as needed (for shortness of breath). ) 1 Inhaler 5  . DEXILANT 60 MG capsule TAKE (1) CAPSULE BY MOUTH EVERY DAY. (Patient taking differently: Take 60 mg by mouth daily. ) 90 capsule 3  . DULoxetine (CYMBALTA) 60 MG capsule Take 60 mg by mouth daily.    . fenofibrate (TRICOR) 145 MG tablet Take 145 mg by mouth every evening.     . ferrous sulfate 325 (65 FE) MG tablet Take 1 tablet (325 mg total) by mouth 2 (two) times daily with a meal. 60 tablet 0  . ferrous sulfate 325 (65 FE) MG tablet Take 325 mg by mouth daily with breakfast.    . folic acid (FOLVITE) 1 MG tablet Take 1 mg by mouth daily.    . furosemide (LASIX) 40 MG tablet Take 60 mg in the morning, take 40 mg in the evening. (Patient taking differently: Take 40-60 mg by mouth See admin instructions. Take 60 mg in the morning, take 40 mg in the evening.) 225 tablet 3  . gabapentin (NEURONTIN) 600 MG tablet  Take 1 tablet (600 mg total) by mouth 2 (two) times daily. (Patient taking differently: Take 600 mg by mouth 3 (three) times daily. ) 20 tablet 0  . insulin detemir (LEVEMIR) 100 UNIT/ML injection Inject 30-60 Units into the skin See admin instructions. 30 units in the morning and 60 units at bedtime    . losartan (COZAAR) 100 MG tablet Take 100 mg by mouth daily.    . Magnesium 400 MG TABS Take 400 mg by mouth daily.     . metFORMIN (GLUCOPHAGE) 1000 MG tablet Take 1,000 mg by mouth 2 (two) times daily with a meal.    . metoprolol tartrate (LOPRESSOR) 25 MG tablet Take 0.5 tablets (12.5 mg total) by mouth 2 (two) times daily. 90 tablet 1  . Multiple Vitamin  (MULTIVITAMIN) tablet Take 1 tablet by mouth every morning.     . OXYGEN Inhale 3-4 L into the lungs continuous.    . pioglitazone (ACTOS) 45 MG tablet Take 45 mg by mouth daily.    . polyethylene glycol (MIRALAX / GLYCOLAX) packet Take 17 g by mouth daily. 14 each 0  . rOPINIRole (REQUIP) 2 MG tablet Take 2 mg by mouth at bedtime.     . rosuvastatin (CRESTOR) 40 MG tablet Take 1 tablet by mouth every evening.    . sitaGLIPtin (JANUVIA) 100 MG tablet Take 100 mg by mouth daily.     . traMADol (ULTRAM) 50 MG tablet     . traZODone (DESYREL) 50 MG tablet Take 150 mg by mouth at bedtime.      No current facility-administered medications for this visit.      Past Surgical History:  Procedure Laterality Date  . CATARACT EXTRACTION, BILATERAL  2016  . COLONOSCOPY WITH ESOPHAGOGASTRODUODENOSCOPY (EGD) N/A 11/09/2012   Dr. 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 hpylori on bx- treated with pylera. TCS- normal rectum. 1 diminutive polyp in the mid descending segment. 1-7mm polyp in the mid desending segment o/w the remainder of the colonic mucosa appeared normal. tubular adenoma on bx  . GIVENS CAPSULE STUDY N/A 10/07/2013   no source for anemia or heme positive stool noted  . NECK SURGERY     bone spurs     No Known Allergies    Family History  Problem Relation Age of Onset  . Heart attack Mother   . Diabetes Sister        Fx  . Heart defect Sister        Fx  . Arthritis Sister        Fx  . Asthma Sister        Fx  . Kidney disease Sister        Fx  . Pancreatitis Sister      Social History Mr. Maneri reports that he has never smoked. His smokeless tobacco use includes chew. Mr. Dejaynes reports no history of alcohol use.   Review of Systems CONSTITUTIONAL: No weight loss, fever, chills, weakness or fatigue.  HEENT: Eyes: No visual loss, blurred vision, double vision or yellow  sclerae.No hearing loss, sneezing, congestion, runny nose or sore throat.  SKIN: No rash or itching.  CARDIOVASCULAR: per hpi RESPIRATORY: No shortness of breath, cough or sputum.  GASTROINTESTINAL: No anorexia, nausea, vomiting or diarrhea. No abdominal pain or blood.  GENITOURINARY: No burning on urination, no polyuria NEUROLOGICAL: No headache, dizziness, syncope, paralysis, ataxia, numbness or tingling in the extremities. No change in bowel or  bladder control.  MUSCULOSKELETAL: No muscle, back pain, joint pain or stiffness.  LYMPHATICS: No enlarged nodes. No history of splenectomy.  PSYCHIATRIC: No history of depression or anxiety.  ENDOCRINOLOGIC: No reports of sweating, cold or heat intolerance. No polyuria or polydipsia.  Marland Kitchen   Physical Examination Vitals:   10/08/18 0957  BP: (!) 142/60  Pulse: 70  SpO2: 94%   Vitals:   10/08/18 0957  Weight: 250 lb (113.4 kg)  Height: 5\' 8"  (1.727 m)    Gen: resting comfortably, no acute distress HEENT: no scleral icterus, pupils equal round and reactive, no palptable cervical adenopathy,  CV: RRR, no m/r/g, no jvd Resp: mild bilateral coarse breath sounds GI: abdomen is soft, non-tender, non-distended, normal bowel sounds, no hepatosplenomegaly MSK: extremities are warm, no edema.  Skin: warm, no rash Neuro:  no focal deficits Psych: appropriate affect   Diagnostic Studies 05/2018 coronary CTA FINDINGS: Non-cardiac: See separate report from [facility].  Situs inversus totalis. The heart is on the right, liver on the left.  Pulmonary veins drain normally to the left atrium.  Calcium Score: 81 Agatston units.  Coronary Arteries: Right dominant with no anomalies  LM: Calcified plaque, no significant stenosis.  LAD system: Small caliber LAD. Mixed plaque proximal to mid LAD, possible moderate stenosis.  Circumflex system: No plaque or stenosis.  RCA system: Mixed plaque distal RCA, no significant  stenosis.  IMPRESSION: 1. Situs inversus totalis.  2. Coronary artery calcium score 81 Agatston units. This places the patient in the 51st percentile for age and gender, suggesting intermediate risk for future cardiac events.  3. The LAD is small in caliber with mixed plaque in the proximal to mid portion of the vessel. Cannot rule out moderate stenosis, will send for FFR.  FINDINGS: FFR 0.76 in the mid LAD.  IMPRESSION: Suspect hemodynamically significant proximal LAD stenosis.    Assessment and Plan  1 Chronic diastolic HF - has done very well since changing lasix to 60mg  in AM and 40mg  in PM. Down another 6 lbs, breathing improved. Labs stable from last month - continue current diuretic.   2. Elevated troponin/CAD - elevated trop in setting of CHF and hypoxia on prior admission - suspect probable demand ischemia in setting of chronic obstructive CAD based on his recent coronary CTA which showed mid LAD disease FFR 0.76 - no recent symptoms, continue current meds - if new symptoms would consider cath at that time.     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  - followed by Dr Luan Pulling.   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   5. Hyperlipidemia - continue statin - unclear if fenofibrate is indicated, obtain labs from pcp  6. HTN - mildly elevated today, multiple appts in 08/2018 was at goal - continue to monitor   F/u 4 months      Arnoldo Lenis, [name]

## 2018-10-08 NOTE — Patient Instructions (Signed)
Medication Instructions:  Your physician recommends that you continue on your current medications as directed. Please refer to the Current Medication list given to you today.   Labwork: I will request labs form pcp  Testing/Procedures: none  Follow-Up: Your physician wants you to follow-up in: 4 months.  You will receive a reminder letter in the mail two months in advance. If you don't receive a letter, please call our office to schedule the follow-up appointment.   Any Other Special Instructions Will Be Listed Below (If Applicable).     If you need a refill on your cardiac medications before your next appointment, please call your pharmacy.

## 2018-12-24 IMAGING — MR MR LUMBAR SPINE W/O CM
4 of 5 series · 26 of 48 positions shown · non-contrast
Comparison: 09/01/2010.

CLINICAL DATA: Chronic low back and leg pain.  No injury.

EXAM:
MRI LUMBAR SPINE WITHOUT CONTRAST
TECHNIQUE: Multiplanar, multisequence MR imaging of the lumbar spine was
performed. No intravenous contrast was administered.

[Series 4: T1 · sagittal · 4.0mm · 0.55mm/px · 5 of 13 slices shown (1 of 2)]
[im 1/13]
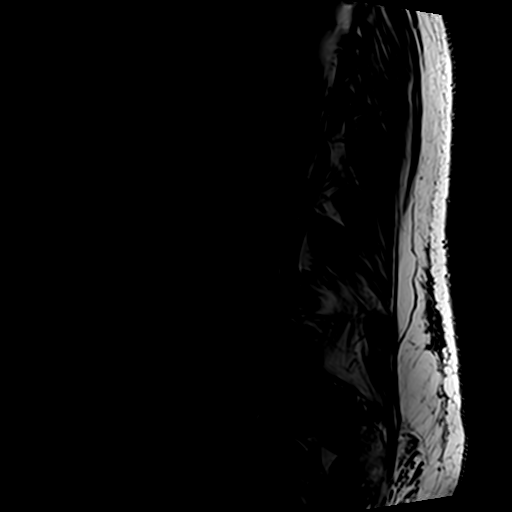
[im 4/13]
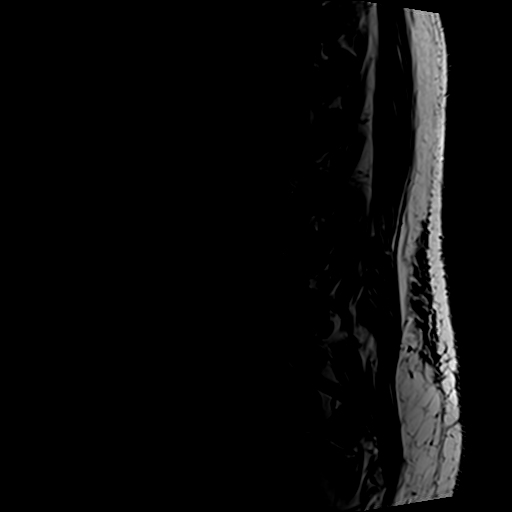
[im 7/13]
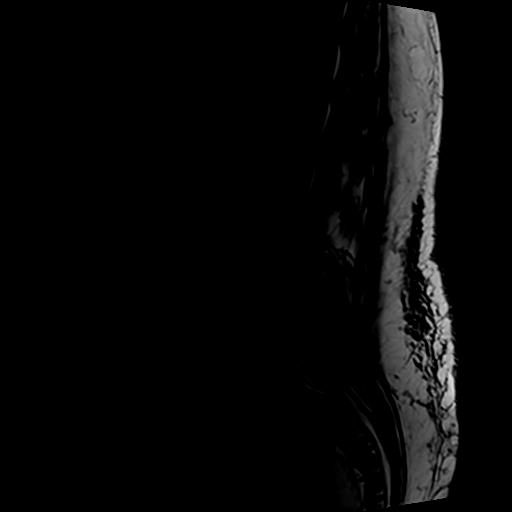
[im 10/13]
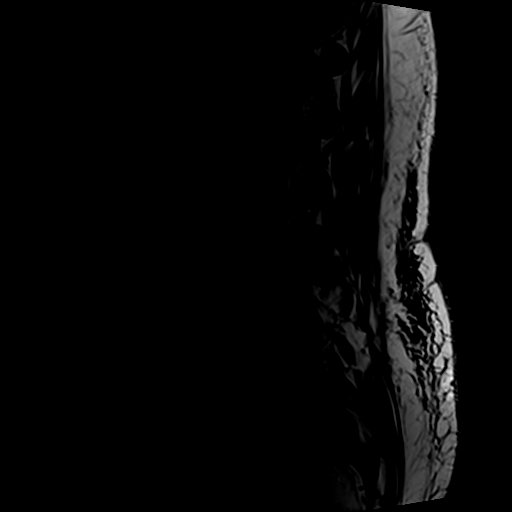
[im 13/13]
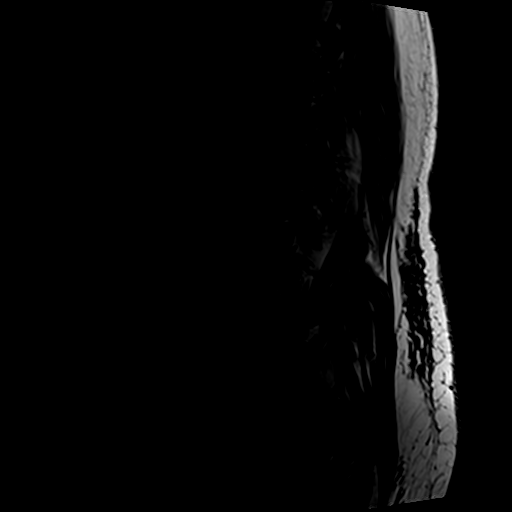

[Series 5: T2 · sagittal · 4.0mm · 0.55mm/px · 5 of 13 slices shown (1 of 2)]
[im 1/13]
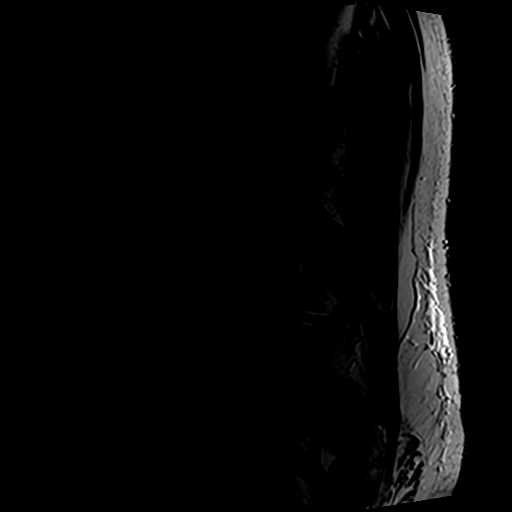
[im 4/13]
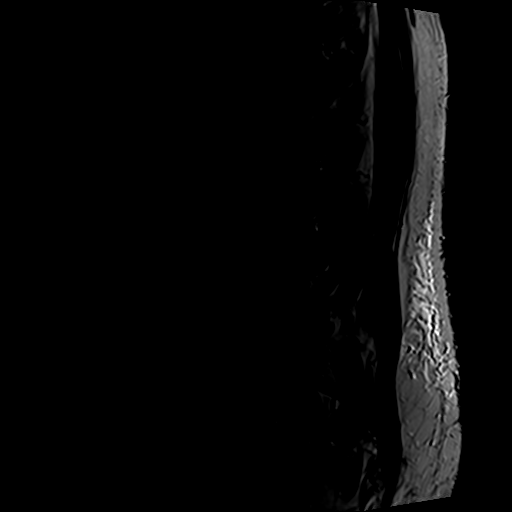
[im 7/13]
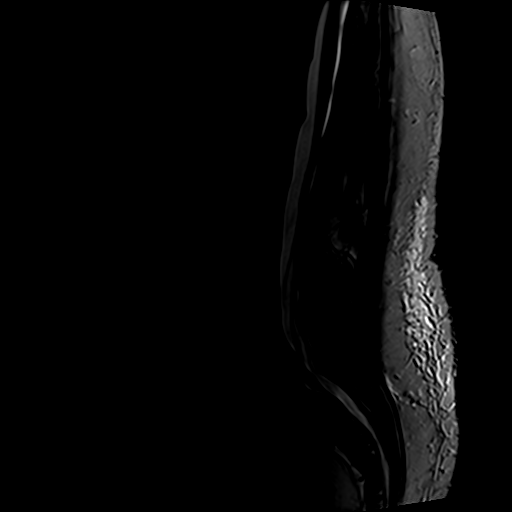
[im 10/13]
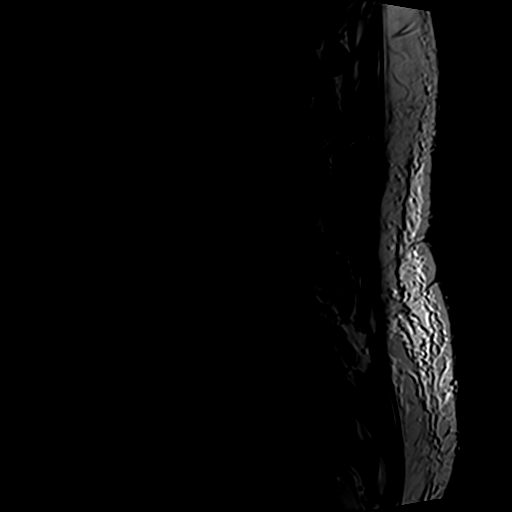
[im 13/13]
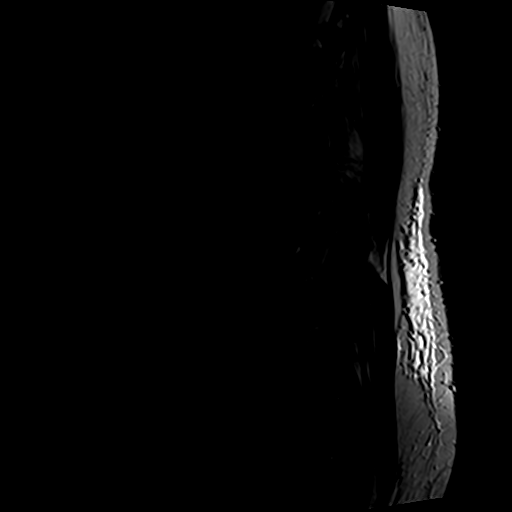

[Series 6: T2 · axial · 4.0mm · 0.82mm/px · z∈[-172,+42]mm · 10 of 40 slices shown (2 of 2)]
[im 3/40]
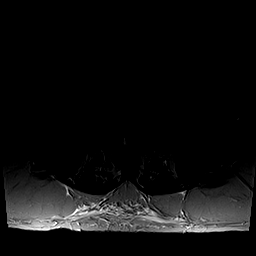
[im 6/40]
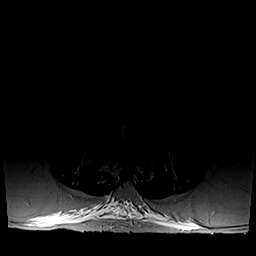
[im 8/40]
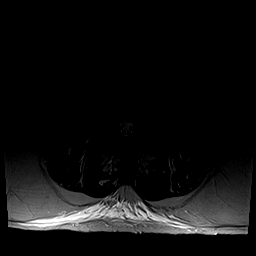
[im 14/40]
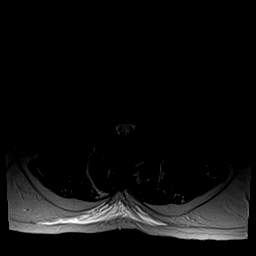
[im 19/40]
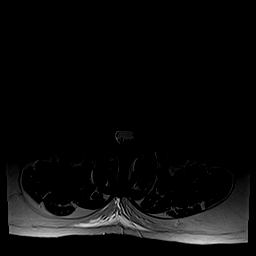
[im 21/40]
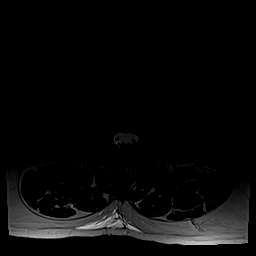
[im 24/40]
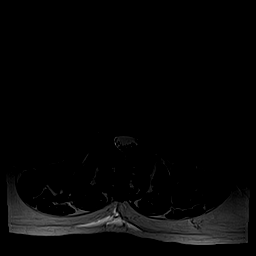
[im 29/40]
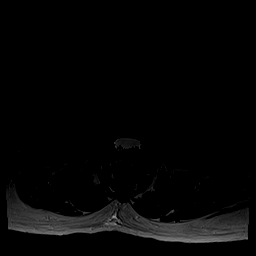
[im 34/40]
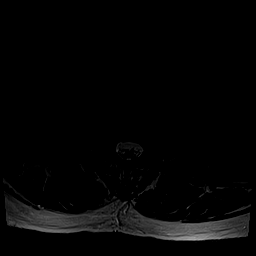
[im 40/40]
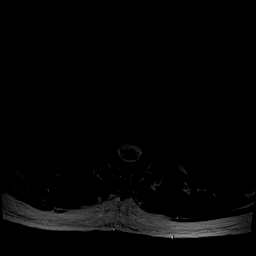

[Series 7: T1 · axial · 4.0mm · 0.41mm/px · z∈[-172,+11]mm · 6 of 40 slices shown (2 of 2)]
[im 3/40]
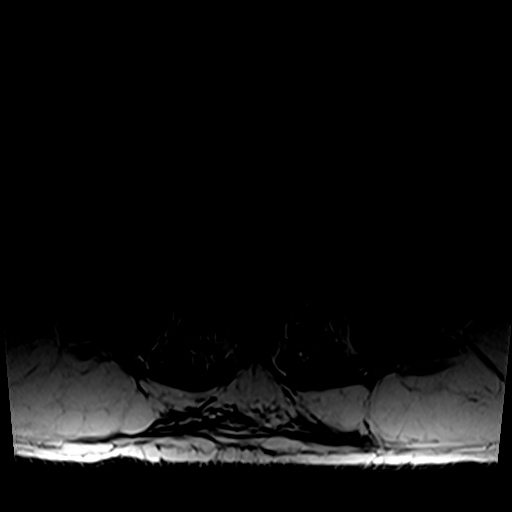
[im 6/40]
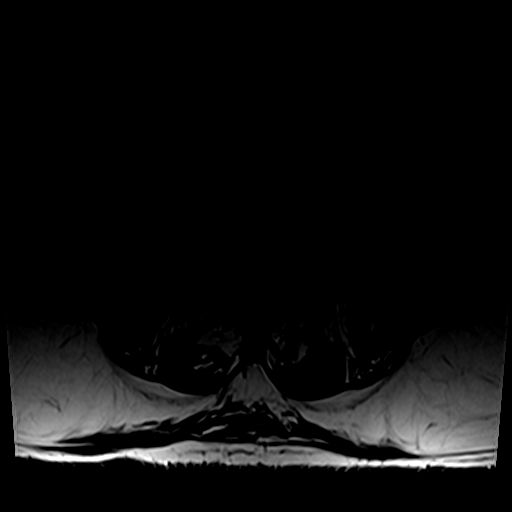
[im 8/40]
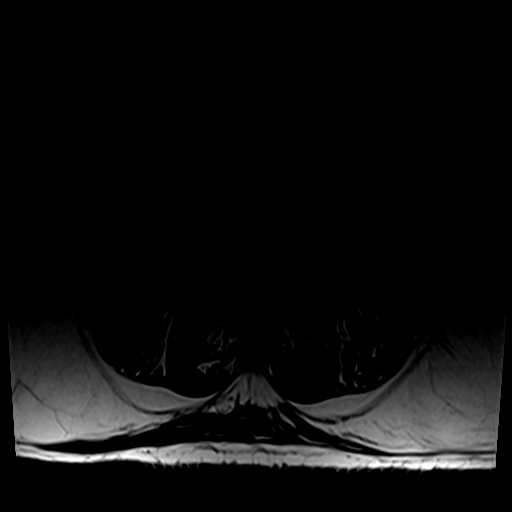
[im 14/40]
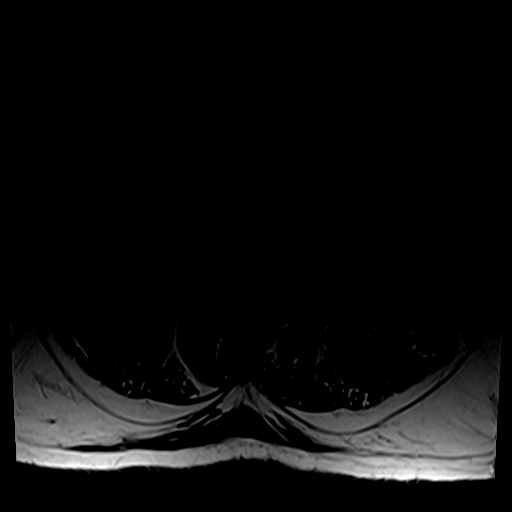
[im 21/40]
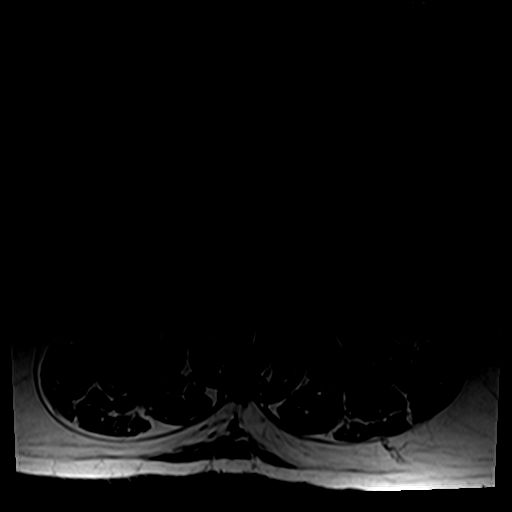
[im 34/40]
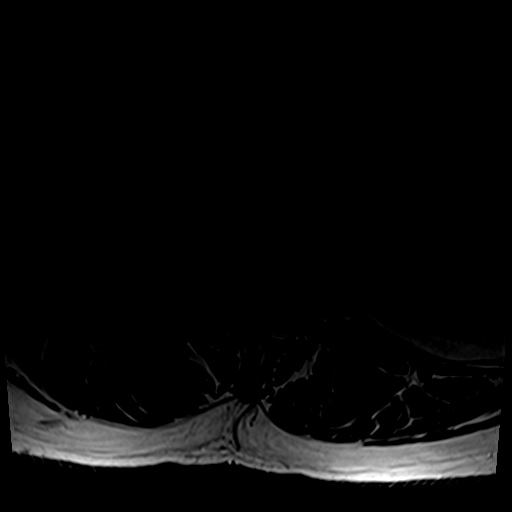

[26 of 48 positions shown; findings below may reference images not displayed]

FINDINGS: Segmentation:  Standard.

Alignment:  Physiologic.

Vertebrae:  No fracture, evidence of discitis, or bone lesion.

Conus medullaris and cauda equina: Conus extends to the L1-L2 level.
Conus and cauda equina appear normal.

Paraspinal and other soft tissues: Increased body habitus. No mass
or hydronephrosis.

Disc levels:

L1-L2: Central protrusion. Slight caudal migration. No impingement.

L2-L3:  Normal disc space.  Mild facet arthropathy.  No impingement.

L3-L4:  Normal disc space.  Mild facet arthropathy.  No impingement.

L4-L5:  Normal disc space.  Mild facet arthropathy.  No impingement.

L5-S1: Disc desiccation. Annular bulge. No protrusion. Mild facet
arthropathy. Trefoil appearance to the thecal sac at the L5-S1 level
and below, prominent ventral epidural space, seen on midline
sagittal images with abundant epidural fat in the spinal canal of
the sacrum. In the appropriate setting, imaging findings are
consistent with epidural lipomatosis.

Compared with 1999, the disc protrusion at L5-S1 has partially
regressed.
IMPRESSION: Disc protrusion at L5-S1 has regressed, now with annular bulge.

Trefoil appearance to the thecal sac at L5-S1 and below, consistent
with epidural lipomatosis. Correlate clinically.

## 2018-12-26 ENCOUNTER — Other Ambulatory Visit (HOSPITAL_COMMUNITY): Payer: 59

## 2018-12-31 ENCOUNTER — Other Ambulatory Visit (HOSPITAL_COMMUNITY): Payer: Self-pay

## 2018-12-31 DIAGNOSIS — D500 Iron deficiency anemia secondary to blood loss (chronic): Secondary | ICD-10-CM

## 2019-01-01 ENCOUNTER — Other Ambulatory Visit: Payer: Self-pay

## 2019-01-01 ENCOUNTER — Inpatient Hospital Stay (HOSPITAL_COMMUNITY): Payer: Medicare HMO | Attending: Hematology

## 2019-01-01 DIAGNOSIS — D509 Iron deficiency anemia, unspecified: Secondary | ICD-10-CM | POA: Insufficient documentation

## 2019-01-01 DIAGNOSIS — G629 Polyneuropathy, unspecified: Secondary | ICD-10-CM | POA: Insufficient documentation

## 2019-01-01 DIAGNOSIS — F329 Major depressive disorder, single episode, unspecified: Secondary | ICD-10-CM | POA: Diagnosis not present

## 2019-01-01 DIAGNOSIS — E119 Type 2 diabetes mellitus without complications: Secondary | ICD-10-CM | POA: Diagnosis not present

## 2019-01-01 DIAGNOSIS — I10 Essential (primary) hypertension: Secondary | ICD-10-CM | POA: Insufficient documentation

## 2019-01-01 DIAGNOSIS — Z79899 Other long term (current) drug therapy: Secondary | ICD-10-CM | POA: Diagnosis not present

## 2019-01-01 DIAGNOSIS — R000 Tachycardia, unspecified: Secondary | ICD-10-CM | POA: Diagnosis not present

## 2019-01-01 DIAGNOSIS — D500 Iron deficiency anemia secondary to blood loss (chronic): Secondary | ICD-10-CM

## 2019-01-01 DIAGNOSIS — D72829 Elevated white blood cell count, unspecified: Secondary | ICD-10-CM | POA: Diagnosis not present

## 2019-01-01 LAB — CBC WITH DIFFERENTIAL/PLATELET
Abs Immature Granulocytes: 0.04 10*3/uL (ref 0.00–0.07)
Basophils Absolute: 0 10*3/uL (ref 0.0–0.1)
Basophils Relative: 0 %
Eosinophils Absolute: 0.1 10*3/uL (ref 0.0–0.5)
Eosinophils Relative: 1 %
HCT: 40.1 % (ref 39.0–52.0)
Hemoglobin: 12.5 g/dL — ABNORMAL LOW (ref 13.0–17.0)
Immature Granulocytes: 0 %
Lymphocytes Relative: 21 %
Lymphs Abs: 2.2 10*3/uL (ref 0.7–4.0)
MCH: 29.4 pg (ref 26.0–34.0)
MCHC: 31.2 g/dL (ref 30.0–36.0)
MCV: 94.4 fL (ref 80.0–100.0)
Monocytes Absolute: 0.9 10*3/uL (ref 0.1–1.0)
Monocytes Relative: 8 %
Neutro Abs: 7.5 10*3/uL (ref 1.7–7.7)
Neutrophils Relative %: 70 %
Platelets: 408 10*3/uL — ABNORMAL HIGH (ref 150–400)
RBC: 4.25 MIL/uL (ref 4.22–5.81)
RDW: 13.6 % (ref 11.5–15.5)
WBC: 10.8 10*3/uL — ABNORMAL HIGH (ref 4.0–10.5)
nRBC: 0 % (ref 0.0–0.2)

## 2019-01-01 LAB — COMPREHENSIVE METABOLIC PANEL
ALT: 14 U/L (ref 0–44)
AST: 21 U/L (ref 15–41)
Albumin: 4 g/dL (ref 3.5–5.0)
Alkaline Phosphatase: 41 U/L (ref 38–126)
Anion gap: 9 (ref 5–15)
BUN: 10 mg/dL (ref 8–23)
CO2: 30 mmol/L (ref 22–32)
Calcium: 9.5 mg/dL (ref 8.9–10.3)
Chloride: 94 mmol/L — ABNORMAL LOW (ref 98–111)
Creatinine, Ser: 0.87 mg/dL (ref 0.61–1.24)
GFR calc Af Amer: >60 mL/min (ref >60)
GFR calc non Af Amer: >60 mL/min (ref >60)
Glucose, Bld: 132 mg/dL — ABNORMAL HIGH (ref 70–99)
Potassium: 4.8 mmol/L (ref 3.5–5.1)
Sodium: 133 mmol/L — ABNORMAL LOW (ref 135–145)
Total Bilirubin: 0.4 mg/dL (ref 0.3–1.2)
Total Protein: 7.3 g/dL (ref 6.5–8.1)

## 2019-01-01 LAB — LACTATE DEHYDROGENASE: LDH: 164 U/L (ref 98–192)

## 2019-01-01 LAB — FERRITIN: Ferritin: 54 ng/mL (ref 24–336)

## 2019-01-02 ENCOUNTER — Ambulatory Visit (HOSPITAL_COMMUNITY): Payer: 59 | Admitting: Hematology

## 2019-01-07 ENCOUNTER — Encounter (HOSPITAL_COMMUNITY): Payer: Self-pay | Admitting: Hematology

## 2019-01-07 ENCOUNTER — Inpatient Hospital Stay (HOSPITAL_BASED_OUTPATIENT_CLINIC_OR_DEPARTMENT_OTHER): Payer: Medicare HMO | Admitting: Hematology

## 2019-01-07 ENCOUNTER — Other Ambulatory Visit: Payer: Self-pay

## 2019-01-07 VITALS — BP 175/81 | HR 71 | Temp 97.9°F | Resp 20 | Wt 241.0 lb

## 2019-01-07 DIAGNOSIS — K909 Intestinal malabsorption, unspecified: Secondary | ICD-10-CM

## 2019-01-07 DIAGNOSIS — D72829 Elevated white blood cell count, unspecified: Secondary | ICD-10-CM | POA: Diagnosis not present

## 2019-01-07 DIAGNOSIS — I10 Essential (primary) hypertension: Secondary | ICD-10-CM

## 2019-01-07 DIAGNOSIS — R000 Tachycardia, unspecified: Secondary | ICD-10-CM

## 2019-01-07 DIAGNOSIS — E119 Type 2 diabetes mellitus without complications: Secondary | ICD-10-CM

## 2019-01-07 DIAGNOSIS — F329 Major depressive disorder, single episode, unspecified: Secondary | ICD-10-CM

## 2019-01-07 DIAGNOSIS — D500 Iron deficiency anemia secondary to blood loss (chronic): Secondary | ICD-10-CM

## 2019-01-07 DIAGNOSIS — D509 Iron deficiency anemia, unspecified: Secondary | ICD-10-CM | POA: Diagnosis not present

## 2019-01-07 DIAGNOSIS — G629 Polyneuropathy, unspecified: Secondary | ICD-10-CM

## 2019-01-07 DIAGNOSIS — Z79899 Other long term (current) drug therapy: Secondary | ICD-10-CM

## 2019-01-07 NOTE — Assessment & Plan Note (Addendum)
1.  Iron deficiency anemia: - Last Feraheme infusion was on 04/13/2018. -I have reviewed his blood work today.  Globin improved to 12.5.  However ferritin has dropped to 54. -Patient currently taking 1 iron tablet daily.  He also takes stool softener. - I have recommended him to increase iron to 2 tablets daily on Mondays, Wednesdays and Fridays and 1 tablet rest of the week. - I will reevaluate him in 2 months with repeat blood work. -If there is no improvement and hemoglobin drops, will consider parenteral iron therapy at that time.  2.  Leukocytosis and thrombocytosis: -He had work-up for myeloproliferative disorders done previously which was negative.  3.  Hypertension: -His blood pressure today is 175/81. -I reviewed his medications.  He is taking metoprolol 25 mg half tablet twice daily.  I have told him to increase it to 1 tablet twice daily.  He was told to follow-up with his cardiologist.

## 2019-01-07 NOTE — Patient Instructions (Addendum)
[place] at [facility] Discharge Instructions  You were seen today by Dr. Delton Coombes. He went over your recent labs results. He would like you to take a whole tablet of your metoprolol daily until you see your PCP or heart doctor. Start taking 2 pills of the iron on Monday, Wednesday and Friday and one tablet the rest of week.  He will see you back in 3 months for labs and follow up.   Thank you for choosing [place] at [facility] to provide your oncology and hematology care.  To afford each patient quality time with our provider, please arrive at least 15 minutes before your scheduled appointment time.   If you have a lab appointment with the [facility] please come in thru the  Main Entrance and check in at the main information desk  You need to re-schedule your appointment should you arrive 10 or more minutes late.  We strive to give you quality time with our providers, and arriving late affects you and other patients whose appointments are after yours.  Also, if you no show three or more times for appointments you may be dismissed from the clinic at the providers discretion.     Again, thank you for choosing [facility].  Our hope is that these requests will decrease the amount of time that you wait before being seen by our physicians.       _____________________________________________________________  Should you have questions after your visit to [facility], please contact our office at [phone] between the hours of 8:00 a.m. and 4:30 p.m.  Voicemails left after 4:00 p.m. will not be returned until the following business day.  For prescription refill requests, have your pharmacy contact our office and allow 72 hours.    Cancer Center Support Programs:   > Cancer Support Group  2nd Tuesday of the month 1pm-2pm, Journey Room

## 2019-01-07 NOTE — Progress Notes (Signed)
[place],  [id number]   CLINIC:  Medical Oncology/Hematology  PCP:  [name], [address] [place] [address] [phone]   REASON FOR VISIT:  Follow-up for Iron deficiency anemia     INTERVAL HISTORY:  Mr. Manke 68 y.o. male returns for routine follow-up. He is here today alone. He states that he continues taking ferrous sulfate over the counter. Denies any nausea, vomiting, or diarrhea. Denies any new pains. Had not noticed any recent bleeding such as epistaxis, hematuria or hematochezia. Denies recent chest pain on exertion, shortness of breath on minimal exertion, pre-syncopal episodes, or palpitations. Denies any numbness or tingling in hands or feet. Denies any recent fevers, infections, or recent hospitalizations. Patient reports appetite at 100% and energy level at 75%.   REVIEW OF SYSTEMS:  Review of Systems  Neurological: Positive for numbness.  All other systems reviewed and are negative.    PAST MEDICAL/SURGICAL HISTORY:  Past Medical History:  Diagnosis Date  . Depression   . Dextrocardia   . Diabetes mellitus   . H. pylori infection 11/09/2012   treated with pylera  . HTN (hypertension)    Cholesterol  . Iron deficiency anemia, unspecified 10/18/2012  . Neuropathy   . Pneumonia   . Situs inversus totalis   . Tachycardia    Past Surgical History:  Procedure Laterality Date  . CATARACT EXTRACTION, BILATERAL  2016  . COLONOSCOPY WITH ESOPHAGOGASTRODUODENOSCOPY (EGD) N/A 11/09/2012   Dr. 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 hpylori on bx- treated with pylera. TCS- normal rectum. 1 diminutive polyp in the mid descending segment. 1-50m polyp in the mid desending segment o/w the remainder of the colonic mucosa appeared normal. tubular adenoma on bx  . GIVENS CAPSULE STUDY N/A 10/07/2013   no source for  anemia or heme positive stool noted  . NECK SURGERY     bone spurs     SOCIAL HISTORY:  Social History   Socioeconomic History  . Marital status: Married    Spouse name: Not on file  . Number of children: 0  . Years of education: 7th grade   . Highest education level: Not on file  Occupational History  . Occupation: lLexicographer . Financial resource strain: Not on file  . Food insecurity:    Worry: Not on file    Inability: Not on file  . Transportation needs:    Medical: Not on file    Non-medical: Not on file  Tobacco Use  . Smoking status: Never Smoker  . Smokeless tobacco: Current User    Types: Chew  Substance and Sexual Activity  . Alcohol use: No  . Drug use: No  . Sexual activity: Not on file  Lifestyle  . Physical activity:    Days per week: Not on file    Minutes per session: Not on file  . Stress: Not on file  Relationships  . Social connections:    Talks on phone: Not on file    Gets together: Not on file    Attends religious service: Not on file    Active member of club or organization: Not on file    Attends meetings of clubs or organizations: Not on file    Relationship status: Not on file  . Intimate partner violence:    Fear of current or ex partner: Not on file  Emotionally abused: Not on file    Physically abused: Not on file    Forced sexual activity: Not on file  Other Topics Concern  . Not on file  Social History Narrative  . Not on file    FAMILY HISTORY:  Family History  Problem Relation Age of Onset  . Heart attack Mother   . Diabetes Sister        Fx  . Heart defect Sister        Fx  . Arthritis Sister        Fx  . Asthma Sister        Fx  . Kidney disease Sister        Fx  . Pancreatitis Sister     CURRENT MEDICATIONS:  Outpatient Encounter Medications as of 01/07/2019  Medication Sig Note  . ACCU-CHEK AVIVA PLUS test strip    . Accu-Chek Softclix Lancets lancets    . albuterol (PROAIR HFA) 108  (90 Base) MCG/ACT inhaler Inhale 2 puffs into the lungs every 4 (four) hours as needed for wheezing or shortness of breath.   Marland Kitchen aspirin EC 81 MG tablet Take 81 mg by mouth daily.   . B-D ULTRAFINE III SHORT PEN 31G X 8 MM MISC    . Blood Glucose Monitoring Suppl (ACCU-CHEK AVIVA PLUS) w/Device KIT    . budesonide-formoterol (SYMBICORT) 160-4.5 MCG/ACT inhaler Inhale 2 puffs into the lungs 2 (two) times daily. (Patient taking differently: Inhale 2 puffs into the lungs daily as needed (for shortness of breath). ) 07/16/2018: Has not been filled since 04/2018 per pharmacy records  . DEXILANT 60 MG capsule TAKE (1) CAPSULE BY MOUTH EVERY DAY. (Patient taking differently: Take 60 mg by mouth daily. )   . DULoxetine (CYMBALTA) 60 MG capsule Take 60 mg by mouth daily.   . fenofibrate (TRICOR) 145 MG tablet Take 145 mg by mouth every evening.    . ferrous sulfate 325 (65 FE) MG tablet Take 325 mg by mouth daily with breakfast.   . folic acid (FOLVITE) 1 MG tablet Take 1 mg by mouth daily.   . furosemide (LASIX) 40 MG tablet Take 60 mg in the morning, take 40 mg in the evening. (Patient taking differently: Take 40-60 mg by mouth See admin instructions. Take 60 mg in the morning, take 40 mg in the evening.)   . GRALISE 600 MG TABS Take 600 mg by mouth daily with supper.    . insulin detemir (LEVEMIR) 100 UNIT/ML injection Inject 30-60 Units into the skin See admin instructions. 30 units in the morning and 60 units at bedtime   . losartan (COZAAR) 100 MG tablet Take 100 mg by mouth daily.   . Magnesium 400 MG TABS Take 400 mg by mouth daily.    . metFORMIN (GLUCOPHAGE) 1000 MG tablet Take 1,000 mg by mouth 2 (two) times daily with a meal.   . metoprolol tartrate (LOPRESSOR) 25 MG tablet Take 0.5 tablets (12.5 mg total) by mouth 2 (two) times daily.   . Multiple Vitamin (MULTIVITAMIN) tablet Take 1 tablet by mouth every morning.    . OXYGEN Inhale 3-4 L into the lungs continuous.   . pioglitazone (ACTOS) 45 MG  tablet Take 45 mg by mouth daily.   . polyethylene glycol (MIRALAX / GLYCOLAX) packet Take 17 g by mouth daily.   Marland Kitchen rOPINIRole (REQUIP) 2 MG tablet Take 2 mg by mouth at bedtime.    . rosuvastatin (CRESTOR) 40 MG tablet Take 1 tablet  by mouth every evening.   . sitaGLIPtin (JANUVIA) 100 MG tablet Take 100 mg by mouth daily.    . traMADol (ULTRAM) 50 MG tablet    . traZODone (DESYREL) 50 MG tablet Take 150 mg by mouth at bedtime.    . [DISCONTINUED] gabapentin (NEURONTIN) 600 MG tablet Take 600 mg by mouth 3 (three) times daily.    No facility-administered encounter medications on file as of 01/07/2019.     ALLERGIES:  No Known Allergies   PHYSICAL EXAM:  ECOG Performance status: 1  Vitals:   01/07/19 1100  BP: (!) 175/81  Pulse: 71  Resp: 20  Temp: 97.9 F (36.6 C)  SpO2: 91%   Filed Weights   01/07/19 1100  Weight: 241 lb (109.3 kg)    Physical Exam Vitals signs reviewed.  Constitutional:      Appearance: Normal appearance.  Cardiovascular:     Rate and Rhythm: Normal rate and regular rhythm.     Heart sounds: Normal heart sounds.  Pulmonary:     Effort: Pulmonary effort is normal.     Breath sounds: Normal breath sounds.  Abdominal:     General: There is no distension.     Palpations: Abdomen is soft. There is no mass.  Musculoskeletal:        General: No swelling.  Skin:    General: Skin is warm.  Neurological:     General: No focal deficit present.     Mental Status: He is alert and oriented to person, place, and time.  Psychiatric:        Mood and Affect: Mood normal.        Behavior: Behavior normal.      LABORATORY DATA:  I have reviewed the labs as listed.  CBC    Component Value Date/Time   WBC 10.8 (H) 01/01/2019 1109   RBC 4.25 01/01/2019 1109   HGB 12.5 (L) 01/01/2019 1109   HCT 40.1 01/01/2019 1109   PLT 408 (H) 01/01/2019 1109   MCV 94.4 01/01/2019 1109   MCH 29.4 01/01/2019 1109   MCHC 31.2 01/01/2019 1109   RDW 13.6 01/01/2019  1109   LYMPHSABS 2.2 01/01/2019 1109   MONOABS 0.9 01/01/2019 1109   EOSABS 0.1 01/01/2019 1109   BASOSABS 0.0 01/01/2019 1109   CMP Latest Ref Rng & Units 01/01/2019 08/27/2018 07/19/2018  Glucose 70 - 99 mg/dL 132(H) 162(H) 147(H)  BUN 8 - 23 mg/dL '10 12 10  ' Creatinine 0.61 - 1.24 mg/dL 0.87 0.90 0.85  Sodium 135 - 145 mmol/L 133(L) 138 139  Potassium 3.5 - 5.1 mmol/L 4.8 4.9 4.6  Chloride 98 - 111 mmol/L 94(L) 98 100  CO2 22 - 32 mmol/L '30 31 30  ' Calcium 8.9 - 10.3 mg/dL 9.5 9.7 9.1  Total Protein 6.5 - 8.1 g/dL 7.3 7.6 6.1(L)  Total Bilirubin 0.3 - 1.2 mg/dL 0.4 0.5 0.5  Alkaline Phos 38 - 126 U/L 41 39 56  AST 15 - 41 U/L 21 17 80(H)  ALT 0 - 44 U/L 14 13 165(H)       DIAGNOSTIC IMAGING:  I have independently reviewed the scans and discussed with the patient.   I have reviewed Venita Lick LPN's note and agree with the documentation.  I personally performed a face-to-face visit, made revisions and my assessment and plan is as follows.    ASSESSMENT & PLAN:   Malabsorption of iron 1.  Iron deficiency anemia: - Last Feraheme infusion was on 04/13/2018. -I have reviewed  his blood work today.  Globin improved to 12.5.  However ferritin has dropped to 54. -Patient currently taking 1 iron tablet daily.  He also takes stool softener. - I have recommended him to increase iron to 2 tablets daily on Mondays, Wednesdays and Fridays and 1 tablet rest of the week. - I will reevaluate him in 2 months with repeat blood work. -If there is no improvement and hemoglobin drops, will consider parenteral iron therapy at that time.  2.  Leukocytosis and thrombocytosis: -He had work-up for myeloproliferative disorders done previously which was negative.  3.  Hypertension: -His blood pressure today is 175/81. -I reviewed his medications.  He is taking metoprolol 25 mg half tablet twice daily.  I have told him to increase it to 1 tablet twice daily.  He was told to follow-up with his  cardiologist.      Orders placed this encounter:  Orders Placed This Encounter  Procedures  . CBC with Differential/Platelet  . Iron and TIBC  . Ferritin      Derek Jack, MD [place] [phone]

## 2019-01-09 ENCOUNTER — Other Ambulatory Visit: Payer: Self-pay

## 2019-01-09 ENCOUNTER — Other Ambulatory Visit: Payer: Self-pay | Admitting: Cardiology

## 2019-01-09 MED ORDER — METOPROLOL TARTRATE 25 MG PO TABS: 25.0000 mg | ORAL_TABLET | Freq: Two times a day (BID) | ORAL | 3 refills | Status: DC

## 2019-01-09 MED ORDER — FUROSEMIDE 40 MG PO TABS: ORAL_TABLET | ORAL | 3 refills | Status: DC

## 2019-01-09 NOTE — Telephone Encounter (Signed)
°  1. Which medications need to be refilled? (please list name of each medication and dose if known) metoprolol tartrate (LOPRESSOR) 25 MG    2. Which pharmacy/location (including street and city if local pharmacy) is medication to be sent to? [place]   3. Do they need a 30 day or 90 day supply?   Patient states that his blood pressure is up and was told by someone at the [facility] to increase his medication.   [phone])

## 2019-01-09 NOTE — Telephone Encounter (Signed)
Refilled lasix per fax request 

## 2019-01-09 NOTE — Telephone Encounter (Signed)
Done- saw office note from [facility].

## 2019-01-18 ENCOUNTER — Telehealth: Payer: Self-pay

## 2019-01-18 NOTE — Telephone Encounter (Signed)
Pt agrees to virtual visit. Will have vitals/medication ready prior to appt.      Virtual Visit Pre-Appointment Phone Call  "(Name), I am calling you today to discuss your upcoming appointment. We are currently trying to limit exposure to the virus that causes COVID-19 by seeing patients at home rather than in the office."  1. "What is the BEST phone number to call the day of the visit?" - include this in appointment notes  2. "Do you have or have access to (through a family member/friend) a smartphone with video capability that we can use for your visit?" a. If yes - list this number in appt notes as "cell" (if different from BEST phone #) and list the appointment type as a VIDEO visit in appointment notes b. If no - list the appointment type as a PHONE visit in appointment notes  3. Confirm consent - "In the setting of the current Covid19 crisis, you are scheduled for a (phone or video) visit with your provider on (date) at (time).  Just as we do with many in-office visits, in order for you to participate in this visit, we must obtain consent.  If you'd like, I can send this to your mychart (if signed up) or email for you to review.  Otherwise, I can obtain your verbal consent now.  All virtual visits are billed to your insurance company just like a normal visit would be.  By agreeing to a virtual visit, we'd like you to understand that the technology does not allow for your provider to perform an examination, and thus may limit your provider's ability to fully assess your condition. If your provider identifies any concerns that need to be evaluated in person, we will make arrangements to do so.  Finally, though the technology is pretty good, we cannot assure that it will always work on either your or our end, and in the setting of a video visit, we may have to convert it to a phone-only visit.  In either situation, we cannot ensure that we have a secure connection.  Are you willing to proceed?"  STAFF: Did the patient verbally acknowledge consent to telehealth visit? Document YES/NO here: YES   4. Advise patient to be prepared - "Two hours prior to your appointment, go ahead and check your blood pressure, pulse, oxygen saturation, and your weight (if you have the equipment to check those) and write them all down. When your visit starts, your provider will ask you for this information. If you have an Apple Watch or Kardia device, please plan to have heart rate information ready on the day of your appointment. Please have a pen and paper handy nearby the day of the visit as well."  5. Give patient instructions for MyChart download to smartphone OR Doximity/Doxy.me as below if video visit (depending on what platform provider is using)  6. Inform patient they will receive a phone call 15 minutes prior to their appointment time (may be from unknown caller ID) so they should be prepared to answer    TELEPHONE CALL NOTE  MERLYN CONLEY has been deemed a candidate for a follow-up tele-health visit to limit community exposure during the Covid-19 pandemic. I spoke with the patient via phone to ensure availability of phone/video source, confirm preferred email & phone number, and discuss instructions and expectations.  I reminded ANTONI STEFAN to be prepared with any vital sign and/or heart rhythm information that could potentially be obtained via home monitoring, at the  time of his visit. I reminded RAHEEM KOLBE to expect a phone call prior to his visit.  Drema Dallas, [place] 01/18/2019 9:19 AM   INSTRUCTIONS FOR DOWNLOADING THE MYCHART APP TO SMARTPHONE  - The patient must first make sure to have activated MyChart and know their login information - If Apple, go to [employer] and type in MyChart in the search bar and download the app. If Android, ask patient to go to [employer] and type in [place] in the search bar and download the app. The app is free but as with any other app downloads,  their phone may require them to verify saved payment information or Apple/Android password.  - The patient will need to then log into the app with their MyChart username and password, and select [facility] as their healthcare provider to link the account. When it is time for your visit, go to the MyChart app, find appointments, and click Begin Video Visit. Be sure to Select Allow for your device to access the Microphone and Camera for your visit. You will then be connected, and your provider will be with you shortly.  **If they have any issues connecting, or need assistance please contact MyChart service desk [phone])**  **If using a computer, in order to ensure the best quality for their visit they will need to use either of the following Internet Browsers: [employer], or Google Chrome**  IF USING DOXIMITY or DOXY.ME - The patient will receive a link just prior to their visit by text.     FULL LENGTH CONSENT FOR TELE-HEALTH VISIT   I hereby voluntarily request, consent and authorize [facility] and its employed or contracted physicians, physician assistants, nurse practitioners or other licensed health care professionals (the Practitioner), to provide me with telemedicine health care services (the "Services") as deemed necessary by the treating Practitioner. I acknowledge and consent to receive the Services by the Practitioner via telemedicine. I understand that the telemedicine visit will involve communicating with the Practitioner through live audiovisual communication technology and the disclosure of certain medical information by electronic transmission. I acknowledge that I have been given the opportunity to request an in-person assessment or other available alternative prior to the telemedicine visit and am voluntarily participating in the telemedicine visit.  I understand that I have the right to withhold or withdraw my consent to the use of telemedicine in the  course of my care at any time, without affecting my right to future care or treatment, and that the Practitioner or I may terminate the telemedicine visit at any time. I understand that I have the right to inspect all information obtained and/or recorded in the course of the telemedicine visit and may receive copies of available information for a reasonable fee.  I understand that some of the potential risks of receiving the Services via telemedicine include:  Marland Kitchen Delay or interruption in medical evaluation due to technological equipment failure or disruption; . Information transmitted may not be sufficient (e.g. poor resolution of images) to allow for appropriate medical decision making by the Practitioner; and/or  . In rare instances, security protocols could fail, causing a breach of personal health information.  Furthermore, I acknowledge that it is my responsibility to provide information about my medical history, conditions and care that is complete and accurate to the best of my ability. I acknowledge that Practitioner's advice, recommendations, and/or decision may be based on factors not within their control, such as incomplete or inaccurate data  provided by me or distortions of diagnostic images or specimens that may result from electronic transmissions. I understand that the practice of medicine is not an exact science and that Practitioner makes no warranties or guarantees regarding treatment outcomes. I acknowledge that I will receive a copy of this consent concurrently upon execution via email to the email address I last provided but may also request a printed copy by calling the office of [facility].    I understand that my insurance will be billed for this visit.   I have read or had this consent read to me. . I understand the contents of this consent, which adequately explains the benefits and risks of the Services being provided via telemedicine.  . I have been provided ample  opportunity to ask questions regarding this consent and the Services and have had my questions answered to my satisfaction. . I give my informed consent for the services to be provided through the use of telemedicine in my medical care  By participating in this telemedicine visit I agree to the above.

## 2019-01-23 ENCOUNTER — Telehealth: Payer: Medicare HMO | Admitting: Cardiology

## 2019-02-12 ENCOUNTER — Telehealth: Payer: Self-pay | Admitting: Cardiology

## 2019-02-12 MED ORDER — FUROSEMIDE 40 MG PO TABS: 40.0000 mg | ORAL_TABLET | Freq: Two times a day (BID) | ORAL | 3 refills | Status: DC

## 2019-02-12 NOTE — Telephone Encounter (Signed)
LM for wife to call back

## 2019-02-12 NOTE — Telephone Encounter (Signed)
Please give pt a call @ [phone]-- wife called stating he's having swelling in feet and up his leg and he's taking his lasix

## 2019-02-12 NOTE — Telephone Encounter (Signed)
Wife notified and voiced understanding.

## 2019-02-12 NOTE — Telephone Encounter (Signed)
Per wife, weight is 247 lbs.She states he has swelling in both feet and ankles for the past 2 weeks.He is taking lasix 60 mg,and 40 mg at 1 pm.She states he has not been eating anything with a lot of sodium. I recommended he weigh himself daily from now on

## 2019-02-12 NOTE — Telephone Encounter (Signed)
If accurate he is up 6 lbs since he saw Dr Eula Flax. Increase lasix to 60mg  bid, update [place] on weights and swelling on Friday.   Zandra Abts MD

## 2019-03-26 ENCOUNTER — Other Ambulatory Visit: Payer: Self-pay

## 2019-03-26 MED ORDER — METOPROLOL TARTRATE 25 MG PO TABS: 25.0000 mg | ORAL_TABLET | Freq: Two times a day (BID) | ORAL | 3 refills | Status: DC

## 2019-03-26 NOTE — Telephone Encounter (Signed)
Refilled Lopresor to [facility]

## 2019-03-28 ENCOUNTER — Telehealth: Payer: Self-pay

## 2019-03-28 MED ORDER — METOPROLOL TARTRATE 25 MG PO TABS: 25.0000 mg | ORAL_TABLET | Freq: Two times a day (BID) | ORAL | 3 refills | Status: DC

## 2019-03-28 NOTE — Telephone Encounter (Signed)
Refill done.  

## 2019-04-10 ENCOUNTER — Other Ambulatory Visit (HOSPITAL_COMMUNITY): Payer: Medicare HMO

## 2019-04-17 ENCOUNTER — Ambulatory Visit (HOSPITAL_COMMUNITY): Payer: Medicare HMO | Admitting: Hematology

## 2019-04-18 ENCOUNTER — Other Ambulatory Visit: Payer: Self-pay

## 2019-04-18 ENCOUNTER — Ambulatory Visit (INDEPENDENT_AMBULATORY_CARE_PROVIDER_SITE_OTHER): Payer: Medicare HMO | Admitting: Cardiology

## 2019-04-18 ENCOUNTER — Encounter: Payer: Self-pay | Admitting: Cardiology

## 2019-04-18 VITALS — BP 127/58 | HR 70 | Temp 96.9°F | Ht 67.0 in | Wt 230.0 lb

## 2019-04-18 DIAGNOSIS — I5032 Chronic diastolic (congestive) heart failure: Secondary | ICD-10-CM

## 2019-04-18 DIAGNOSIS — E782 Mixed hyperlipidemia: Secondary | ICD-10-CM

## 2019-04-18 DIAGNOSIS — I10 Essential (primary) hypertension: Secondary | ICD-10-CM

## 2019-04-18 DIAGNOSIS — I2510 Atherosclerotic heart disease of native coronary artery without angina pectoris: Secondary | ICD-10-CM | POA: Diagnosis not present

## 2019-04-18 NOTE — Patient Instructions (Addendum)
Medication Instructions:  Your physician recommends that you continue on your current medications as directed. Please refer to the Current Medication list given to you today.   Labwork: NONE  Testing/Procedures: NONE  Follow-Up: Your physician recommends that you schedule a follow-up appointment in: 4 MONTHS    Any Other Special Instructions Will Be Listed Below (If Applicable).     If you need a refill on your cardiac medications before your next appointment, please call your pharmacy.   

## 2019-04-18 NOTE — Progress Notes (Signed)
Clinical Summary Brett [name] is a 68 y.o.male seen today for follow up of the following medical problems.    1. Chronic diastolic HF - 11/863 echo LVEF 60-65%, indeterminant diastolic function - admisssion 04/12/18 with SOB. Mutlfactorial including diastolic HF but also related to his chronic lung disease,. He was diuresed, also managed with bronchodilators and antibiotics for respiratory infection - readmitted later in 04/2018 with recurrent SOB. Managed again for COPD/bronchiectasis and diastolic HF    - in 03/8468 we received a call about weight up from 241 to 247 lbs. Was to incrase lasix from 49m in AM and 481mpm to 6033mid.  - weight down from 241 in April to 230 lbs today since change. He went back down to his lasix 34m88m AM and in 40mg16m  -no recent SOB or DOE.    2. Elevated troponin/CAD - mild trop elevation to 0.43 in setting of HF and hypoxia, did not have typical chest pain. Thought likely demand ischemia, plans were to consider possible outpatient stress - echo normal LVEF no WMAs.  05/2018 coronary CTA with mid vessel stenosis, significant by FFR 0.76  - denies any chest pain.     3. Bronchiectasis -followed by pulmoMariel Sleetff home O2.     4. Leg pain - mostly with walking. Dull pain bilateral thighs into knees. Better with rest. - 06/2018 normal ABIs   5. Hyperlipidemia - on statin, on fenofibrate from other provider.     Past Medical History:  Diagnosis Date  . Depression   . Dextrocardia   . Diabetes mellitus   . H. pylori infection 11/09/2012   treated with pylera  . HTN (hypertension)    Cholesterol  . Iron deficiency anemia, unspecified 10/18/2012  . Neuropathy   . Pneumonia   . Situs inversus totalis   . Tachycardia      No Known Allergies   Current Outpatient Medications  Medication Sig Dispense Refill  . ACCU-CHEK AVIVA PLUS test strip     . Accu-Chek Softclix Lancets lancets     .  albuterol (PROAIR HFA) 108 (90 Base) MCG/ACT inhaler Inhale 2 puffs into the lungs every 4 (four) hours as needed for wheezing or shortness of breath. 1 Inhaler 2  . aspirin EC 81 MG tablet Take 81 mg by mouth daily.    . B-D ULTRAFINE III SHORT PEN 31G X 8 MM MISC     . Blood Glucose Monitoring Suppl (ACCU-CHEK AVIVA PLUS) w/Device KIT     . budesonide-formoterol (SYMBICORT) 160-4.5 MCG/ACT inhaler Inhale 2 puffs into the lungs 2 (two) times daily. (Patient taking differently: Inhale 2 puffs into the lungs daily as needed (for shortness of breath). ) 1 Inhaler 5  . DEXILANT 60 MG capsule TAKE (1) CAPSULE BY MOUTH EVERY DAY. (Patient taking differently: Take 60 mg by mouth daily. ) 90 capsule 3  . DULoxetine (CYMBALTA) 60 MG capsule Take 60 mg by mouth daily.    . fenofibrate (TRICOR) 145 MG tablet Take 145 mg by mouth every evening.     . ferrous sulfate 325 (65 FE) MG tablet Take 325 mg by mouth daily with breakfast.    . folic acid (FOLVITE) 1 MG tablet Take 1 mg by mouth daily.    . furosemide (LASIX) 40 MG tablet Take 1 tablet (40 mg total) by mouth 2 (two) times daily. 270 tablet 3  . GRALISE 600 MG TABS Take 600 mg by mouth daily with supper.     .Marland Kitchen  insulin detemir (LEVEMIR) 100 UNIT/ML injection Inject 30-60 Units into the skin See admin instructions. 30 units in the morning and 60 units at bedtime    . losartan (COZAAR) 100 MG tablet Take 100 mg by mouth daily.    . Magnesium 400 MG TABS Take 400 mg by mouth daily.     . metFORMIN (GLUCOPHAGE) 1000 MG tablet Take 1,000 mg by mouth 2 (two) times daily with a meal.    . metoprolol tartrate (LOPRESSOR) 25 MG tablet Take 1 tablet (25 mg total) by mouth 2 (two) times daily. 180 tablet 3  . Multiple Vitamin (MULTIVITAMIN) tablet Take 1 tablet by mouth every morning.     . OXYGEN Inhale 3-4 L into the lungs continuous.    . pioglitazone (ACTOS) 45 MG tablet Take 45 mg by mouth daily.    . polyethylene glycol (MIRALAX / GLYCOLAX) packet Take 17 g  by mouth daily. 14 each 0  . rOPINIRole (REQUIP) 2 MG tablet Take 2 mg by mouth at bedtime.     . rosuvastatin (CRESTOR) 40 MG tablet Take 1 tablet by mouth every evening.    . sitaGLIPtin (JANUVIA) 100 MG tablet Take 100 mg by mouth daily.     . traMADol (ULTRAM) 50 MG tablet     . traZODone (DESYREL) 50 MG tablet Take 150 mg by mouth at bedtime.      No current facility-administered medications for this visit.      Past Surgical History:  Procedure Laterality Date  . CATARACT EXTRACTION, BILATERAL  2016  . COLONOSCOPY WITH ESOPHAGOGASTRODUODENOSCOPY (EGD) N/A 11/09/2012   Dr. 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 hpylori on bx- treated with pylera. TCS- normal rectum. 1 diminutive polyp in the mid descending segment. 1-48m polyp in the mid desending segment o/w the remainder of the colonic mucosa appeared normal. tubular adenoma on bx  . GIVENS CAPSULE STUDY N/A 10/07/2013   no source for anemia or heme positive stool noted  . NECK SURGERY     bone spurs     No Known Allergies    Family History  Problem Relation Age of Onset  . Heart attack Mother   . Diabetes Sister        Fx  . Heart defect Sister        Fx  . Arthritis Sister        Fx  . Asthma Sister        Fx  . Kidney disease Sister        Fx  . Pancreatitis Sister      Social History Mr. PWhiteselreports that he has never smoked. His smokeless tobacco use includes chew. Mr. PHeldreports no history of alcohol use.   Review of Systems CONSTITUTIONAL: No weight loss, fever, chills, weakness or fatigue.  HEENT: Eyes: No visual loss, blurred vision, double vision or yellow sclerae.No hearing loss, sneezing, congestion, runny nose or sore throat.  SKIN: No rash or itching.  CARDIOVASCULAR: per hpi RESPIRATORY: No shortness of breath, cough or sputum.  GASTROINTESTINAL: No anorexia, nausea, vomiting or diarrhea. No  abdominal pain or blood.  GENITOURINARY: No burning on urination, no polyuria NEUROLOGICAL: No headache, dizziness, syncope, paralysis, ataxia, numbness or tingling in the extremities. No change in bowel or bladder control.  MUSCULOSKELETAL: No muscle, back pain, joint pain or stiffness.  LYMPHATICS: No enlarged nodes. No history of splenectomy.  PSYCHIATRIC: No history of depression or anxiety.  ENDOCRINOLOGIC: No reports of sweating, cold or heat intolerance. No polyuria or polydipsia.  Marland Kitchen   Physical Examination Today's Vitals   04/18/19 1119  BP: (!) 127/58  Pulse: 70  Temp: (!) 96.9 F (36.1 C)  TempSrc: Temporal  Weight: 230 lb (104.3 kg)  Height: _0  (1.702 m)   Body mass index is 36.02 kg/m.  Gen: resting comfortably, no acute distress HEENT: no scleral icterus, pupils equal round and reactive, no palptable cervical adenopathy,  CV": RRR, no m/r/g, no jvd Resp: Clear to auscultation bilaterally GI: abdomen is soft, non-tender, non-distended, normal bowel sounds, no hepatosplenomegaly MSK: extremities are warm, no edema.  Skin: warm, no rash Neuro:  no focal deficits Psych: appropriate affect   Diagnostic Studies 05/2018 coronary CTA FINDINGS: Non-cardiac: See separate report from [facility].  Situs inversus totalis. The heart is on the right, liver on the left.  Pulmonary veins drain normally to the left atrium.  Calcium Score: 81 Agatston units.  Coronary Arteries: Right dominant with no anomalies  LM: Calcified plaque, no significant stenosis.  LAD system: Small caliber LAD. Mixed plaque proximal to mid LAD, possible moderate stenosis.  Circumflex system: No plaque or stenosis.  RCA system: Mixed plaque distal RCA, no significant stenosis.  IMPRESSION: 1. Situs inversus totalis.  2. Coronary artery calcium score 81 Agatston units. This places the patient in the 51st percentile for age and gender, suggesting intermediate risk  for future cardiac events.  3. The LAD is small in caliber with mixed plaque in the proximal to mid portion of the vessel. Cannot rule out moderate stenosis, will send for FFR.  FINDINGS: FFR 0.76 in the mid LAD.  IMPRESSION: Suspect hemodynamically significant proximal LAD stenosis.    Assessment and Plan   1Chronic diastolic HF -has done very. Down additional 20 lbs since Jan 2020. Euvolemic on exam - continue current diuretic  2. 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 -followed by Dr Luan Pulling.  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   5. Hyperlipidemia - he will continue statin  6. HTN - at goal, continue current meds      Arnoldo Lenis, [name]

## 2019-04-24 ENCOUNTER — Inpatient Hospital Stay (HOSPITAL_COMMUNITY): Payer: Medicare HMO | Attending: Hematology

## 2019-04-24 ENCOUNTER — Other Ambulatory Visit: Payer: Self-pay

## 2019-04-24 DIAGNOSIS — G629 Polyneuropathy, unspecified: Secondary | ICD-10-CM | POA: Insufficient documentation

## 2019-04-24 DIAGNOSIS — R001 Bradycardia, unspecified: Secondary | ICD-10-CM | POA: Insufficient documentation

## 2019-04-24 DIAGNOSIS — Z79899 Other long term (current) drug therapy: Secondary | ICD-10-CM | POA: Diagnosis not present

## 2019-04-24 DIAGNOSIS — D72829 Elevated white blood cell count, unspecified: Secondary | ICD-10-CM | POA: Insufficient documentation

## 2019-04-24 DIAGNOSIS — Q240 Dextrocardia: Secondary | ICD-10-CM | POA: Diagnosis not present

## 2019-04-24 DIAGNOSIS — F329 Major depressive disorder, single episode, unspecified: Secondary | ICD-10-CM | POA: Diagnosis not present

## 2019-04-24 DIAGNOSIS — I10 Essential (primary) hypertension: Secondary | ICD-10-CM | POA: Diagnosis not present

## 2019-04-24 DIAGNOSIS — M7989 Other specified soft tissue disorders: Secondary | ICD-10-CM | POA: Diagnosis not present

## 2019-04-24 DIAGNOSIS — D509 Iron deficiency anemia, unspecified: Secondary | ICD-10-CM | POA: Diagnosis not present

## 2019-04-24 DIAGNOSIS — E119 Type 2 diabetes mellitus without complications: Secondary | ICD-10-CM | POA: Diagnosis not present

## 2019-04-24 DIAGNOSIS — D500 Iron deficiency anemia secondary to blood loss (chronic): Secondary | ICD-10-CM

## 2019-04-24 LAB — IRON AND TIBC
Iron: 89 ug/dL (ref 45–182)
Saturation Ratios: 16 % — ABNORMAL LOW (ref 17.9–39.5)
TIBC: 540 ug/dL — ABNORMAL HIGH (ref 250–450)
UIBC: 451 ug/dL

## 2019-04-24 LAB — CBC WITH DIFFERENTIAL/PLATELET
Abs Immature Granulocytes: 0.04 10*3/uL (ref 0.00–0.07)
Basophils Absolute: 0 10*3/uL (ref 0.0–0.1)
Basophils Relative: 0 %
Eosinophils Absolute: 0.1 10*3/uL (ref 0.0–0.5)
Eosinophils Relative: 1 %
HCT: 39.9 % (ref 39.0–52.0)
Hemoglobin: 12.5 g/dL — ABNORMAL LOW (ref 13.0–17.0)
Immature Granulocytes: 0 %
Lymphocytes Relative: 15 %
Lymphs Abs: 1.6 10*3/uL (ref 0.7–4.0)
MCH: 28.5 pg (ref 26.0–34.0)
MCHC: 31.3 g/dL (ref 30.0–36.0)
MCV: 90.9 fL (ref 80.0–100.0)
Monocytes Absolute: 0.9 10*3/uL (ref 0.1–1.0)
Monocytes Relative: 9 %
Neutro Abs: 8.1 10*3/uL — ABNORMAL HIGH (ref 1.7–7.7)
Neutrophils Relative %: 75 %
Platelets: 412 10*3/uL — ABNORMAL HIGH (ref 150–400)
RBC: 4.39 MIL/uL (ref 4.22–5.81)
RDW: 14.1 % (ref 11.5–15.5)
WBC: 10.8 10*3/uL — ABNORMAL HIGH (ref 4.0–10.5)
nRBC: 0 % (ref 0.0–0.2)

## 2019-04-24 LAB — FERRITIN: Ferritin: 78 ng/mL (ref 24–336)

## 2019-05-01 ENCOUNTER — Encounter (HOSPITAL_COMMUNITY): Payer: Self-pay | Admitting: Hematology

## 2019-05-01 ENCOUNTER — Inpatient Hospital Stay (HOSPITAL_COMMUNITY): Payer: Medicare HMO | Admitting: Hematology

## 2019-05-01 ENCOUNTER — Other Ambulatory Visit: Payer: Self-pay

## 2019-05-01 VITALS — BP 124/53 | HR 64 | Temp 97.8°F | Resp 16 | Wt 226.0 lb

## 2019-05-01 DIAGNOSIS — D500 Iron deficiency anemia secondary to blood loss (chronic): Secondary | ICD-10-CM | POA: Diagnosis not present

## 2019-05-01 DIAGNOSIS — D509 Iron deficiency anemia, unspecified: Secondary | ICD-10-CM | POA: Diagnosis not present

## 2019-05-01 NOTE — Patient Instructions (Signed)
[facility] at [facility] Discharge Instructions  You were seen today by Dr. Katragadda. He went over your recent lab results. He will see you back in 4 months for labs and follow up.   Thank you for choosing [facility] at [facility] to provide your oncology and hematology care.  To afford each patient quality time with our provider, please arrive at least 15 minutes before your scheduled appointment time.   If you have a lab appointment with the Cancer Center please come in thru the  Main Entrance and check in at the main information desk  You need to re-schedule your appointment should you arrive 10 or more minutes late.  We strive to give you quality time with our providers, and arriving late affects you and other patients whose appointments are after yours.  Also, if you no show three or more times for appointments you may be dismissed from the clinic at the providers discretion.     Again, thank you for choosing [facility].  Our hope is that these requests will decrease the amount of time that you wait before being seen by our physicians.       _____________________________________________________________  Should you have questions after your visit to [facility], please contact our office at [phone] between the hours of 8:00 a.m. and 4:30 p.m.  Voicemails left after 4:00 p.m. will not be returned until the following business day.  For prescription refill requests, have your pharmacy contact our office and allow 72 hours.    Cancer Center Support Programs:   > Cancer Support Group  2nd Tuesday of the month 1pm-2pm, Journey Room    

## 2019-05-01 NOTE — Assessment & Plan Note (Signed)
1.  Iron deficiency anemia: - Last Feraheme infusion was on 04/13/2018. -He was taking iron tablet once daily.  I have increased his iron to 2 tablets on Monday Wednesday Friday and 1 tablet rest of the week in April of this year. -He reports tremendous increase in his energy level since last visit. -Denies any bleeding per rectum or melena. -We reviewed his blood work.  Hemoglobin is 12.5 and stable.  Ferritin has improved to 78 from 54.  Percent saturation is 16. -He was told to continue the same dose of iron.  He does not require any parenteral iron therapy. - We will see him back in 4 months with repeat labs.  2.  Leukocytosis and thrombocytosis: - Work-up for myeloproliferative disease done previously was negative.

## 2019-05-01 NOTE — Progress Notes (Signed)
[place], [place] [address]   CLINIC:  Medical Oncology/Hematology  PCP:  [name], [address] [place] [address] [phone]   REASON FOR VISIT:  Follow-up for Iron deficiency anemia     INTERVAL HISTORY:  Mr. Bisping 68 y.o. male seen for follow-up of iron deficiency state.  Denies any bleeding per rectum or melena.  He reports that he has been more active in the last few months.  Denies any nosebleeds or hematuria.  No ER visits or hospitalizations.  Denies any fevers, night sweats.  Lost about 20 pounds since last visit but was trying to lose it.  He reports that his goal is 160 pounds.  He has been working around the house lately.  He is taking iron 2 tablets on Monday, Wednesday and Friday and 1 tablet the rest of the week.  Denies any ER visits or hospitalizations.  Ankle swellings have been stable.  Energy and appetite are 100%.  No pain reported.   REVIEW OF SYSTEMS:  Review of Systems  Cardiovascular: Positive for leg swelling.  All other systems reviewed and are negative.    PAST MEDICAL/SURGICAL HISTORY:  Past Medical History:  Diagnosis Date   Depression    Dextrocardia    Diabetes mellitus    H. pylori infection 11/09/2012   treated with pylera   HTN (hypertension)    Cholesterol   Iron deficiency anemia, unspecified 10/18/2012   Neuropathy    Pneumonia    Situs inversus totalis    Tachycardia    Past Surgical History:  Procedure Laterality Date   CATARACT EXTRACTION, BILATERAL  2016   COLONOSCOPY WITH ESOPHAGOGASTRODUODENOSCOPY (EGD) N/A 11/09/2012   Dr. 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 hpylori on bx- treated with pylera. TCS- normal rectum. 1 diminutive polyp in the mid descending segment. 1-60m polyp in the mid desending segment o/w the remainder of the colonic mucosa appeared normal.  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   Food insecurity    Worry: Not on file    Inability: Not on file   Transportation needs    Medical: Not on file    Non-medical: Not on file  Tobacco Use   Smoking status: Never Smoker   Smokeless tobacco: Current User    Types: Chew  Substance and Sexual Activity   Alcohol use: No   Drug use: No   Sexual activity: Not on file  Lifestyle   Physical activity    Days per week: Not on file    Minutes per session: Not on file   Stress: Not on file  Relationships   Social connections    Talks on phone: Not on file    Gets together: Not on file    Attends religious service: Not on file    Active member of club or organization: Not on file  Attends meetings of clubs or organizations: Not on file    Relationship status: Not on file   Intimate partner violence    Fear of current or ex partner: Not on file    Emotionally abused: Not on file    Physically abused: Not on file    Forced sexual activity: Not on file  Other Topics Concern   Not on file  Social History Narrative   Not on file    FAMILY HISTORY:  Family History  Problem Relation Age of Onset   Heart attack Mother    Diabetes Sister        Fx   Heart defect Sister        Fx   Arthritis Sister        Fx   Asthma Sister        Fx   Kidney disease Sister        Fx   Pancreatitis Sister     CURRENT MEDICATIONS:  Outpatient Encounter Medications as of 05/01/2019  Medication Sig Note   ACCU-CHEK AVIVA PLUS test strip       Accu-Chek Softclix Lancets lancets     albuterol (PROAIR HFA) 108 (90 Base) MCG/ACT inhaler Inhale 2 puffs into the lungs every 4 (four) hours as needed for wheezing or shortness of breath.    aspirin EC 81 MG tablet Take 81 mg by mouth daily.    B-D ULTRAFINE III SHORT PEN 31G X 8 MM MISC     Blood Glucose Monitoring Suppl (ACCU-CHEK AVIVA PLUS) w/Device KIT     budesonide-formoterol (SYMBICORT) 160-4.5 MCG/ACT inhaler Inhale 2 puffs into the lungs 2 (two) times daily. (Patient taking differently: Inhale 2 puffs into the lungs daily as needed (for shortness of breath). ) 07/16/2018: Has not been filled since 04/2018 per pharmacy records   Bylas 60 MG capsule TAKE (1) CAPSULE BY MOUTH EVERY DAY. (Patient taking differently: Take 60 mg by mouth daily. )    DULoxetine (CYMBALTA) 60 MG capsule Take 60 mg by mouth daily.    fenofibrate (TRICOR) 145 MG tablet Take 145 mg by mouth every evening.     ferrous sulfate 325 (65 FE) MG tablet Take 325 mg by mouth daily with breakfast.    folic acid (FOLVITE) 1 MG tablet Take 1 mg by mouth daily.    furosemide (LASIX) 40 MG tablet Take 1 tablet (40 mg total) by mouth 2 (two) times daily.    GRALISE 600 MG TABS Take 600 mg by mouth daily with supper.     insulin detemir (LEVEMIR) 100 UNIT/ML injection Inject 30-60 Units into the skin See admin instructions. 30 units in the morning and 60 units at bedtime    losartan (COZAAR) 100 MG tablet Take 100 mg by mouth daily.    Magnesium 400 MG TABS Take 400 mg by mouth daily.     metFORMIN (GLUCOPHAGE) 1000 MG tablet Take 1,000 mg by mouth 2 (two) times daily with a meal.    metoprolol tartrate (LOPRESSOR) 25 MG tablet Take 1 tablet (25 mg total) by mouth 2 (two) times daily.    Multiple Vitamin (MULTIVITAMIN) tablet Take 1 tablet by mouth every morning.     OXYGEN Inhale 3-4 L into the lungs continuous.    pioglitazone (ACTOS) 45 MG tablet Take 45 mg by mouth daily.    polyethylene glycol  (MIRALAX / GLYCOLAX) packet Take 17 g by mouth daily.    rOPINIRole (REQUIP) 2 MG tablet Take 2 mg by  mouth at bedtime.     rosuvastatin (CRESTOR) 40 MG tablet Take 1 tablet by mouth every evening.    sitaGLIPtin (JANUVIA) 100 MG tablet Take 100 mg by mouth daily.     traMADol (ULTRAM) 50 MG tablet     traZODone (DESYREL) 50 MG tablet Take 150 mg by mouth at bedtime.     No facility-administered encounter medications on file as of 05/01/2019.     ALLERGIES:  No Known Allergies   PHYSICAL EXAM:  ECOG Performance status: 1  Vitals:   05/01/19 1208  BP: (!) 124/53  Pulse: 64  Resp: 16  Temp: 97.8 F (36.6 C)  SpO2: 98%   Filed Weights   05/01/19 1208  Weight: 226 lb (102.5 kg)    Physical Exam Vitals signs reviewed.  Constitutional:      Appearance: Normal appearance.  Cardiovascular:     Rate and Rhythm: Normal rate and regular rhythm.     Heart sounds: Normal heart sounds.  Pulmonary:     Effort: Pulmonary effort is normal.     Breath sounds: Normal breath sounds.  Abdominal:     General: There is no distension.     Palpations: Abdomen is soft. There is no mass.  Musculoskeletal:        General: No swelling.  Skin:    General: Skin is warm.  Neurological:     General: No focal deficit present.     Mental Status: He is alert and oriented to person, place, and time.  Psychiatric:        Mood and Affect: Mood normal.        Behavior: Behavior normal.      LABORATORY DATA:  I have reviewed the labs as listed.  CBC    Component Value Date/Time   WBC 10.8 (H) 04/24/2019 1228   RBC 4.39 04/24/2019 1228   HGB 12.5 (L) 04/24/2019 1228   HCT 39.9 04/24/2019 1228   PLT 412 (H) 04/24/2019 1228   MCV 90.9 04/24/2019 1228   MCH 28.5 04/24/2019 1228   MCHC 31.3 04/24/2019 1228   RDW 14.1 04/24/2019 1228   LYMPHSABS 1.6 04/24/2019 1228   MONOABS 0.9 04/24/2019 1228   EOSABS 0.1 04/24/2019 1228   BASOSABS 0.0 04/24/2019 1228   CMP Latest Ref Rng & Units  01/01/2019 08/27/2018 07/19/2018  Glucose 70 - 99 mg/dL 132(H) 162(H) 147(H)  BUN 8 - 23 mg/dL '10 12 10  ' Creatinine 0.61 - 1.24 mg/dL 0.87 0.90 0.85  Sodium 135 - 145 mmol/L 133(L) 138 139  Potassium 3.5 - 5.1 mmol/L 4.8 4.9 4.6  Chloride 98 - 111 mmol/L 94(L) 98 100  CO2 22 - 32 mmol/L '30 31 30  ' Calcium 8.9 - 10.3 mg/dL 9.5 9.7 9.1  Total Protein 6.5 - 8.1 g/dL 7.3 7.6 6.1(L)  Total Bilirubin 0.3 - 1.2 mg/dL 0.4 0.5 0.5  Alkaline Phos 38 - 126 U/L 41 39 56  AST 15 - 41 U/L 21 17 80(H)  ALT 0 - 44 U/L 14 13 165(H)       DIAGNOSTIC IMAGING:  I have independently reviewed the scans and discussed with the patient.   I have reviewed Venita Lick LPN's note and agree with the documentation.  I personally performed a face-to-face visit, made revisions and my assessment and plan is as follows.    ASSESSMENT & PLAN:   Iron deficiency anemia due to chronic blood loss 1.  Iron deficiency anemia: - Last Feraheme infusion was on 04/13/2018. -He  was taking iron tablet once daily.  I have increased his iron to 2 tablets on Monday Wednesday Friday and 1 tablet rest of the week in April of this year. -He reports tremendous increase in his energy level since last visit. -Denies any bleeding per rectum or melena. -We reviewed his blood work.  Hemoglobin is 12.5 and stable.  Ferritin has improved to 78 from 54.  Percent saturation is 16. -He was told to continue the same dose of iron.  He does not require any parenteral iron therapy. - We will see him back in 4 months with repeat labs.  2.  Leukocytosis and thrombocytosis: - Work-up for myeloproliferative disease done previously was negative.        Orders placed this encounter:  Orders Placed This Encounter  Procedures   CBC with Differential/Platelet   Comprehensive metabolic panel   Iron and TIBC   Ferritin      Derek Jack, MD [place] [phone]

## 2019-05-19 ENCOUNTER — Encounter (HOSPITAL_COMMUNITY): Payer: Self-pay

## 2019-05-19 ENCOUNTER — Emergency Department (HOSPITAL_COMMUNITY): Payer: Medicare HMO

## 2019-05-19 ENCOUNTER — Inpatient Hospital Stay (HOSPITAL_COMMUNITY)
Admission: EM | Admit: 2019-05-19 | Discharge: 2019-05-21 | DRG: 291 | Disposition: A | Discharge status: Home / Self Care | Payer: Medicare HMO | Attending: Family Medicine | Admitting: Family Medicine

## 2019-05-19 ENCOUNTER — Other Ambulatory Visit: Payer: Self-pay

## 2019-05-19 DIAGNOSIS — K909 Intestinal malabsorption, unspecified: Secondary | ICD-10-CM | POA: Diagnosis present

## 2019-05-19 DIAGNOSIS — Z8249 Family history of ischemic heart disease and other diseases of the circulatory system: Secondary | ICD-10-CM | POA: Diagnosis not present

## 2019-05-19 DIAGNOSIS — J9621 Acute and chronic respiratory failure with hypoxia: Secondary | ICD-10-CM | POA: Diagnosis present

## 2019-05-19 DIAGNOSIS — I5032 Chronic diastolic (congestive) heart failure: Secondary | ICD-10-CM | POA: Diagnosis not present

## 2019-05-19 DIAGNOSIS — Z9842 Cataract extraction status, left eye: Secondary | ICD-10-CM | POA: Diagnosis not present

## 2019-05-19 DIAGNOSIS — D72829 Elevated white blood cell count, unspecified: Secondary | ICD-10-CM | POA: Diagnosis present

## 2019-05-19 DIAGNOSIS — E1140 Type 2 diabetes mellitus with diabetic neuropathy, unspecified: Secondary | ICD-10-CM | POA: Diagnosis present

## 2019-05-19 DIAGNOSIS — F17220 Nicotine dependence, chewing tobacco, uncomplicated: Secondary | ICD-10-CM | POA: Diagnosis present

## 2019-05-19 DIAGNOSIS — R0602 Shortness of breath: Secondary | ICD-10-CM | POA: Diagnosis not present

## 2019-05-19 DIAGNOSIS — J441 Chronic obstructive pulmonary disease with (acute) exacerbation: Secondary | ICD-10-CM | POA: Diagnosis present

## 2019-05-19 DIAGNOSIS — Z20828 Contact with and (suspected) exposure to other viral communicable diseases: Secondary | ICD-10-CM | POA: Diagnosis present

## 2019-05-19 DIAGNOSIS — Z833 Family history of diabetes mellitus: Secondary | ICD-10-CM | POA: Diagnosis not present

## 2019-05-19 DIAGNOSIS — R600 Localized edema: Secondary | ICD-10-CM | POA: Diagnosis present

## 2019-05-19 DIAGNOSIS — Z794 Long term (current) use of insulin: Secondary | ICD-10-CM

## 2019-05-19 DIAGNOSIS — E119 Type 2 diabetes mellitus without complications: Secondary | ICD-10-CM | POA: Diagnosis not present

## 2019-05-19 DIAGNOSIS — I5033 Acute on chronic diastolic (congestive) heart failure: Secondary | ICD-10-CM | POA: Diagnosis present

## 2019-05-19 DIAGNOSIS — Z825 Family history of asthma and other chronic lower respiratory diseases: Secondary | ICD-10-CM

## 2019-05-19 DIAGNOSIS — Q240 Dextrocardia: Secondary | ICD-10-CM | POA: Diagnosis not present

## 2019-05-19 DIAGNOSIS — Z9841 Cataract extraction status, right eye: Secondary | ICD-10-CM

## 2019-05-19 DIAGNOSIS — F17229 Nicotine dependence, chewing tobacco, with unspecified nicotine-induced disorders: Secondary | ICD-10-CM | POA: Diagnosis not present

## 2019-05-19 DIAGNOSIS — R7989 Other specified abnormal findings of blood chemistry: Secondary | ICD-10-CM | POA: Diagnosis present

## 2019-05-19 DIAGNOSIS — D473 Essential (hemorrhagic) thrombocythemia: Secondary | ICD-10-CM | POA: Diagnosis present

## 2019-05-19 DIAGNOSIS — I110 Hypertensive heart disease with heart failure: Secondary | ICD-10-CM | POA: Diagnosis not present

## 2019-05-19 DIAGNOSIS — Q893 Situs inversus: Secondary | ICD-10-CM

## 2019-05-19 DIAGNOSIS — Z8711 Personal history of peptic ulcer disease: Secondary | ICD-10-CM

## 2019-05-19 DIAGNOSIS — Z7982 Long term (current) use of aspirin: Secondary | ICD-10-CM

## 2019-05-19 DIAGNOSIS — J984 Other disorders of lung: Secondary | ICD-10-CM

## 2019-05-19 DIAGNOSIS — I10 Essential (primary) hypertension: Secondary | ICD-10-CM | POA: Diagnosis not present

## 2019-05-19 DIAGNOSIS — K219 Gastro-esophageal reflux disease without esophagitis: Secondary | ICD-10-CM | POA: Diagnosis present

## 2019-05-19 DIAGNOSIS — J811 Chronic pulmonary edema: Secondary | ICD-10-CM

## 2019-05-19 DIAGNOSIS — J810 Acute pulmonary edema: Secondary | ICD-10-CM

## 2019-05-19 DIAGNOSIS — Z79899 Other long term (current) drug therapy: Secondary | ICD-10-CM | POA: Diagnosis not present

## 2019-05-19 DIAGNOSIS — D75839 Thrombocytosis, unspecified: Secondary | ICD-10-CM | POA: Diagnosis present

## 2019-05-19 DIAGNOSIS — J9601 Acute respiratory failure with hypoxia: Secondary | ICD-10-CM | POA: Diagnosis present

## 2019-05-19 DIAGNOSIS — I509 Heart failure, unspecified: Secondary | ICD-10-CM | POA: Diagnosis not present

## 2019-05-19 DIAGNOSIS — D509 Iron deficiency anemia, unspecified: Secondary | ICD-10-CM | POA: Diagnosis present

## 2019-05-19 DIAGNOSIS — E785 Hyperlipidemia, unspecified: Secondary | ICD-10-CM | POA: Diagnosis present

## 2019-05-19 LAB — COMPREHENSIVE METABOLIC PANEL
ALT: 14 U/L (ref 0–44)
AST: 18 U/L (ref 15–41)
Albumin: 3.8 g/dL (ref 3.5–5.0)
Alkaline Phosphatase: 48 U/L (ref 38–126)
Anion gap: 10 (ref 5–15)
BUN: 15 mg/dL (ref 8–23)
CO2: 29 mmol/L (ref 22–32)
Calcium: 9.5 mg/dL (ref 8.9–10.3)
Chloride: 97 mmol/L — ABNORMAL LOW (ref 98–111)
Creatinine, Ser: 1.12 mg/dL (ref 0.61–1.24)
GFR calc Af Amer: >60 mL/min (ref >60)
GFR calc non Af Amer: >60 mL/min (ref >60)
Glucose, Bld: 110 mg/dL — ABNORMAL HIGH (ref 70–99)
Potassium: 5.1 mmol/L (ref 3.5–5.1)
Sodium: 136 mmol/L (ref 135–145)
Total Bilirubin: 0.3 mg/dL (ref 0.3–1.2)
Total Protein: 7.2 g/dL (ref 6.5–8.1)

## 2019-05-19 LAB — CBC WITH DIFFERENTIAL/PLATELET
Abs Immature Granulocytes: 0.04 10*3/uL (ref 0.00–0.07)
Basophils Absolute: 0 10*3/uL (ref 0.0–0.1)
Basophils Relative: 0 %
Eosinophils Absolute: 0.1 10*3/uL (ref 0.0–0.5)
Eosinophils Relative: 1 %
HCT: 32.2 % — ABNORMAL LOW (ref 39.0–52.0)
Hemoglobin: 9.8 g/dL — ABNORMAL LOW (ref 13.0–17.0)
Immature Granulocytes: 0 %
Lymphocytes Relative: 11 %
Lymphs Abs: 1.2 10*3/uL (ref 0.7–4.0)
MCH: 29.3 pg (ref 26.0–34.0)
MCHC: 30.4 g/dL (ref 30.0–36.0)
MCV: 96.4 fL (ref 80.0–100.0)
Monocytes Absolute: 0.7 10*3/uL (ref 0.1–1.0)
Monocytes Relative: 6 %
Neutro Abs: 9 10*3/uL — ABNORMAL HIGH (ref 1.7–7.7)
Neutrophils Relative %: 82 %
Platelets: 398 10*3/uL (ref 150–400)
RBC: 3.34 MIL/uL — ABNORMAL LOW (ref 4.22–5.81)
RDW: 14.6 % (ref 11.5–15.5)
WBC: 11 10*3/uL — ABNORMAL HIGH (ref 4.0–10.5)
nRBC: 0 % (ref 0.0–0.2)

## 2019-05-19 LAB — GLUCOSE, CAPILLARY
Glucose-Capillary: 112 mg/dL — ABNORMAL HIGH (ref 70–99)
Glucose-Capillary: 271 mg/dL — ABNORMAL HIGH (ref 70–99)
Glucose-Capillary: 266 mg/dL — ABNORMAL HIGH (ref 70–99)

## 2019-05-19 LAB — BRAIN NATRIURETIC PEPTIDE: B Natriuretic Peptide: 553 pg/mL — ABNORMAL HIGH (ref 0.0–100.0)

## 2019-05-19 LAB — D-DIMER, QUANTITATIVE: D-Dimer, Quant: 0.94 ug/mL-FEU — ABNORMAL HIGH (ref 0.00–0.50)

## 2019-05-19 LAB — SARS CORONAVIRUS 2 BY RT PCR (HOSPITAL ORDER, PERFORMED IN ~~LOC~~ HOSPITAL LAB): SARS Coronavirus 2: NEGATIVE

## 2019-05-19 MED ORDER — ROPINIROLE HCL 1 MG PO TABS
2.0000 mg | ORAL_TABLET | Freq: Every day | ORAL | Status: DC
  Administered 2019-05-19 – 2019-05-20 (×2): 2 mg via ORAL
  Filled 2019-05-19 (×2): qty 2

## 2019-05-19 MED ORDER — HEPARIN SODIUM (PORCINE) 5000 UNIT/ML IJ SOLN
5000.0000 [IU] | Freq: Three times a day (TID) | INTRAMUSCULAR | Status: DC
  Administered 2019-05-19 – 2019-05-21 (×6): 5000 [IU] via SUBCUTANEOUS
  Filled 2019-05-19 (×6): qty 1

## 2019-05-19 MED ORDER — ONDANSETRON HCL 4 MG PO TABS: 4.0000 mg | ORAL_TABLET | Freq: Four times a day (QID) | ORAL | Status: DC | PRN

## 2019-05-19 MED ORDER — METHYLPREDNISOLONE SODIUM SUCC 40 MG IJ SOLR
40.0000 mg | Freq: Three times a day (TID) | INTRAMUSCULAR | Status: DC
  Administered 2019-05-20 – 2019-05-21 (×4): 40 mg via INTRAVENOUS
  Filled 2019-05-19 (×4): qty 1

## 2019-05-19 MED ORDER — ONDANSETRON HCL 4 MG/2ML IJ SOLN: 4.0000 mg | Freq: Four times a day (QID) | INTRAMUSCULAR | Status: DC | PRN

## 2019-05-19 MED ORDER — DOXYCYCLINE HYCLATE 100 MG PO TABS
100.0000 mg | ORAL_TABLET | Freq: Two times a day (BID) | ORAL | Status: DC
  Administered 2019-05-19 – 2019-05-21 (×4): 100 mg via ORAL
  Filled 2019-05-19 (×4): qty 1

## 2019-05-19 MED ORDER — METOPROLOL TARTRATE 25 MG PO TABS
25.0000 mg | ORAL_TABLET | Freq: Two times a day (BID) | ORAL | Status: DC
  Administered 2019-05-19 – 2019-05-21 (×3): 25 mg via ORAL
  Filled 2019-05-19 (×4): qty 1

## 2019-05-19 MED ORDER — ACETAMINOPHEN 650 MG RE SUPP: 650.0000 mg | Freq: Four times a day (QID) | RECTAL | Status: DC | PRN

## 2019-05-19 MED ORDER — ALBUTEROL (5 MG/ML) CONTINUOUS INHALATION SOLN
10.0000 mg/h | INHALATION_SOLUTION | RESPIRATORY_TRACT | Status: DC
  Administered 2019-05-19: 14:00:00 10 mg/h via RESPIRATORY_TRACT
  Filled 2019-05-19: qty 20

## 2019-05-19 MED ORDER — IPRATROPIUM-ALBUTEROL 0.5-2.5 (3) MG/3ML IN SOLN
3.0000 mL | RESPIRATORY_TRACT | Status: DC
  Administered 2019-05-19 – 2019-05-20 (×3): 3 mL via RESPIRATORY_TRACT
  Filled 2019-05-19 (×4): qty 3

## 2019-05-19 MED ORDER — ASPIRIN EC 81 MG PO TBEC
81.0000 mg | DELAYED_RELEASE_TABLET | Freq: Every day | ORAL | Status: DC
  Administered 2019-05-20 – 2019-05-21 (×2): 81 mg via ORAL
  Filled 2019-05-19 (×2): qty 1

## 2019-05-19 MED ORDER — INSULIN DETEMIR 100 UNIT/ML ~~LOC~~ SOLN
30.0000 [IU] | Freq: Every day | SUBCUTANEOUS | Status: DC
  Administered 2019-05-19 – 2019-05-20 (×2): 30 [IU] via SUBCUTANEOUS
  Filled 2019-05-19 (×3): qty 0.3

## 2019-05-19 MED ORDER — GUAIFENESIN ER 600 MG PO TB12
1200.0000 mg | ORAL_TABLET | Freq: Two times a day (BID) | ORAL | Status: DC
  Administered 2019-05-19 – 2019-05-21 (×4): 1200 mg via ORAL
  Filled 2019-05-19 (×4): qty 2

## 2019-05-19 MED ORDER — MOMETASONE FURO-FORMOTEROL FUM 200-5 MCG/ACT IN AERO
2.0000 | INHALATION_SPRAY | Freq: Two times a day (BID) | RESPIRATORY_TRACT | Status: DC
  Administered 2019-05-20 – 2019-05-21 (×2): 2 via RESPIRATORY_TRACT
  Filled 2019-05-19 (×2): qty 8.8

## 2019-05-19 MED ORDER — SODIUM CHLORIDE 0.9 % IV SOLN: 250.0000 mL | INTRAVENOUS | Status: DC | PRN

## 2019-05-19 MED ORDER — ROSUVASTATIN CALCIUM 20 MG PO TABS
40.0000 mg | ORAL_TABLET | Freq: Every evening | ORAL | Status: DC
  Administered 2019-05-20: 17:00:00 40 mg via ORAL
  Filled 2019-05-19: qty 2

## 2019-05-19 MED ORDER — TRAZODONE HCL 50 MG PO TABS
150.0000 mg | ORAL_TABLET | Freq: Every day | ORAL | Status: DC
  Administered 2019-05-19 – 2019-05-20 (×2): 150 mg via ORAL
  Filled 2019-05-19 (×2): qty 3

## 2019-05-19 MED ORDER — FOLIC ACID 1 MG PO TABS
1.0000 mg | ORAL_TABLET | Freq: Every day | ORAL | Status: DC
  Administered 2019-05-20 – 2019-05-21 (×2): 1 mg via ORAL
  Filled 2019-05-19 (×2): qty 1

## 2019-05-19 MED ORDER — PANTOPRAZOLE SODIUM 40 MG PO TBEC
40.0000 mg | DELAYED_RELEASE_TABLET | Freq: Every day | ORAL | Status: DC
  Administered 2019-05-20 – 2019-05-21 (×2): 40 mg via ORAL
  Filled 2019-05-19 (×2): qty 1

## 2019-05-19 MED ORDER — NICOTINE 21 MG/24HR TD PT24
21.0000 mg | MEDICATED_PATCH | Freq: Every day | TRANSDERMAL | Status: DC
  Filled 2019-05-19 (×2): qty 1

## 2019-05-19 MED ORDER — FUROSEMIDE 10 MG/ML IJ SOLN
INTRAMUSCULAR | Status: AC
  Filled 2019-05-19: qty 4

## 2019-05-19 MED ORDER — GABAPENTIN 300 MG PO CAPS
600.0000 mg | ORAL_CAPSULE | Freq: Every day | ORAL | Status: DC
  Administered 2019-05-20: 17:00:00 600 mg via ORAL
  Filled 2019-05-19: qty 2

## 2019-05-19 MED ORDER — GABAPENTIN (ONCE-DAILY) 600 MG PO TABS: 600.0000 mg | ORAL_TABLET | Freq: Every day | ORAL | Status: DC

## 2019-05-19 MED ORDER — INSULIN ASPART 100 UNIT/ML ~~LOC~~ SOLN
0.0000 [IU] | Freq: Three times a day (TID) | SUBCUTANEOUS | Status: DC
  Administered 2019-05-20: 17:00:00 2 [IU] via SUBCUTANEOUS

## 2019-05-19 MED ORDER — GABAPENTIN 100 MG PO CAPS
400.0000 mg | ORAL_CAPSULE | Freq: Three times a day (TID) | ORAL | Status: DC | PRN
  Filled 2019-05-19: qty 4

## 2019-05-19 MED ORDER — SODIUM CHLORIDE 0.9% FLUSH
3.0000 mL | Freq: Two times a day (BID) | INTRAVENOUS | Status: DC
  Administered 2019-05-19 – 2019-05-21 (×4): 3 mL via INTRAVENOUS

## 2019-05-19 MED ORDER — INSULIN ASPART 100 UNIT/ML ~~LOC~~ SOLN
0.0000 [IU] | Freq: Every day | SUBCUTANEOUS | Status: DC
  Administered 2019-05-19: 22:00:00 3 [IU] via SUBCUTANEOUS
  Administered 2019-05-20: 21:00:00 2 [IU] via SUBCUTANEOUS

## 2019-05-19 MED ORDER — IOHEXOL 350 MG/ML SOLN
100.0000 mL | Freq: Once | INTRAVENOUS | Status: AC | PRN
  Administered 2019-05-19: 16:00:00 100 mL via INTRAVENOUS

## 2019-05-19 MED ORDER — ACETAMINOPHEN 325 MG PO TABS: 650.0000 mg | ORAL_TABLET | Freq: Four times a day (QID) | ORAL | Status: DC | PRN

## 2019-05-19 MED ORDER — INSULIN ASPART 100 UNIT/ML ~~LOC~~ SOLN
6.0000 [IU] | Freq: Three times a day (TID) | SUBCUTANEOUS | Status: DC
  Administered 2019-05-19 – 2019-05-21 (×6): 6 [IU] via SUBCUTANEOUS

## 2019-05-19 MED ORDER — OXYCODONE HCL 5 MG PO TABS
5.0000 mg | ORAL_TABLET | Freq: Four times a day (QID) | ORAL | Status: DC | PRN
  Administered 2019-05-20 (×2): 5 mg via ORAL
  Filled 2019-05-19 (×2): qty 1

## 2019-05-19 MED ORDER — METHYLPREDNISOLONE SODIUM SUCC 125 MG IJ SOLR
125.0000 mg | Freq: Once | INTRAMUSCULAR | Status: AC
  Administered 2019-05-19: 15:00:00 125 mg via INTRAVENOUS
  Filled 2019-05-19: qty 2

## 2019-05-19 MED ORDER — FERROUS SULFATE 325 (65 FE) MG PO TABS
325.0000 mg | ORAL_TABLET | Freq: Every day | ORAL | Status: DC
  Administered 2019-05-20 – 2019-05-21 (×2): 325 mg via ORAL
  Filled 2019-05-19 (×2): qty 1

## 2019-05-19 MED ORDER — SENNOSIDES-DOCUSATE SODIUM 8.6-50 MG PO TABS
1.0000 | ORAL_TABLET | Freq: Two times a day (BID) | ORAL | Status: DC | PRN
  Administered 2019-05-20: 1 via ORAL
  Filled 2019-05-19: qty 1

## 2019-05-19 MED ORDER — DULOXETINE HCL 60 MG PO CPEP
60.0000 mg | ORAL_CAPSULE | Freq: Every day | ORAL | Status: DC
  Administered 2019-05-20 – 2019-05-21 (×2): 60 mg via ORAL
  Filled 2019-05-19 (×2): qty 1

## 2019-05-19 MED ORDER — SODIUM CHLORIDE 0.9% FLUSH: 3.0000 mL | INTRAVENOUS | Status: DC | PRN

## 2019-05-19 MED ORDER — FENOFIBRATE 54 MG PO TABS
54.0000 mg | ORAL_TABLET | Freq: Every evening | ORAL | Status: DC
  Administered 2019-05-20: 21:00:00 54 mg via ORAL
  Filled 2019-05-19 (×4): qty 1

## 2019-05-19 MED ORDER — FUROSEMIDE 10 MG/ML IJ SOLN
80.0000 mg | Freq: Once | INTRAMUSCULAR | Status: AC
  Administered 2019-05-19: 15:00:00 80 mg via INTRAVENOUS
  Filled 2019-05-19: qty 8

## 2019-05-19 NOTE — ED Triage Notes (Signed)
Pt reports that his oxygen level has ben low for 3 days. The lowest has been in the 50's. He has been coughing and SOB with exertion. He states he has been using his sister oxygen and it come up then once O2 is off it goes back down

## 2019-05-19 NOTE — ED Provider Notes (Signed)
[facility] EMERGENCY DEPARTMENT Provider Note   CSN: [id number] Arrival date & time: 05/19/19  1048     History   Chief Complaint Chief Complaint  Patient presents with  . hypoxia    HPI Brett [name] is a 68 y.o. male.     HPI Patient states he has had worsening shortness of breath with exertion for the past 3 to 4 days.  Has mild nonproductive cough.  Denies any fever or chills.  States he has been using his sister's oxygen and albuterol inhaler at home.  Also states he has had increased bilateral lower extremity swelling.  No known Covid exposures. Past Medical History:  Diagnosis Date  . Depression   . Dextrocardia   . Diabetes mellitus   . H. pylori infection 11/09/2012   treated with pylera  . HTN (hypertension)    Cholesterol  . Iron deficiency anemia, unspecified 10/18/2012  . Neuropathy   . Pneumonia   . Situs inversus totalis   . Tachycardia     Patient Active Problem List   Diagnosis Date Noted  . Constipation 07/19/2018  . 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 Chronic respiratory failure with hypoxia ([place]) 07/16/2018  . HTN (hypertension) 07/16/2018  . Acute on chronic respiratory failure with hypoxia ([place]) 04/21/2018  . COPD (chronic obstructive pulmonary disease) ([place]) 04/21/2018  . SVT (supraventricular tachycardia) ([place]) 04/14/2018  . Paroxysmal SVT (supraventricular tachycardia) ([place]) 04/14/2018  . Chronic diastolic CHF (congestive heart failure) ([place]) 04/14/2018  . CHF exacerbation ([place]) 04/10/2018  . 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 Restrictive lung disease 11/08/2016  . 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 Moderate persistent asthma, uncomplicated 0000000  . Thrombocytosis ([place]) 10/09/2014  . Abdominal pain 09/23/2014  . Gastritis, Helicobacter pylori [id number]  . Insulin-requiring or dependent type II diabetes mellitus ([place]) 01/03/2014  . 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 GERD (gastroesophageal reflux disease) 10/18/2012  .  Nausea 10/18/2012  . Iron deficiency anemia due to chronic blood loss 10/18/2012  . HIP PAIN 08/31/2010  . SPINAL STENOSIS 06/28/2010  . TENDINITIS, CALCIFIC, SHOULDER, RIGHT 06/14/2010  . IMPINGEMENT SYNDROME 06/14/2010    Past Surgical History:  Procedure Laterality Date  . CATARACT EXTRACTION, BILATERAL  2016  . COLONOSCOPY WITH ESOPHAGOGASTRODUODENOSCOPY (EGD) N/A 11/09/2012   Dr. 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 hpylori on bx- treated with pylera. TCS- normal rectum. 1 diminutive polyp in the mid descending segment. 1-34mm polyp in the mid desending segment o/w the remainder of the colonic mucosa appeared normal. tubular adenoma on bx  . GIVENS CAPSULE STUDY N/A 10/07/2013   no source for anemia or heme positive stool noted  . NECK SURGERY     bone spurs        Home Medications    Prior to Admission medications   Medication Sig Start Date End Date Taking? Authorizing Provider  albuterol (PROAIR HFA) 108 (90 Base) MCG/ACT inhaler Inhale 2 puffs into the lungs every 4 (four) hours as needed for wheezing or shortness of breath. 09/27/16  Yes Valentina Shaggy, MD  aspirin EC 81 MG tablet Take 81 mg by mouth daily.   Yes [provider]  budesonide-formoterol (SYMBICORT) 160-4.5 MCG/ACT inhaler Inhale 2 puffs into the lungs 2 (two) times daily. Patient taking differently: Inhale 2 puffs into the lungs daily as needed (for shortness of breath).  09/27/16  Yes Valentina Shaggy, MD  CINNAMON PO Take 2,000 tablets by mouth daily.  Yes [provider]  DULoxetine (CYMBALTA) 60 MG capsule Take 60 mg by mouth daily.   Yes [provider]  fenofibrate (TRICOR) 145 MG tablet Take 145 mg by mouth every evening.  05/28/18  Yes [provider]  ferrous sulfate 325 (65 FE) MG tablet Take 325-650 mg by mouth daily with breakfast. Take two tablets on Monday  Wednesday, Friday. And 1 tablet on all other days   Yes [provider]  folic acid (FOLVITE) 1 MG tablet Take 1 mg by mouth daily.   Yes [provider]  furosemide (LASIX) 40 MG tablet Take 1 tablet (40 mg total) by mouth 2 (two) times daily. Patient taking differently: Take 30-40 mg by mouth 2 (two) times daily. Take one and a half tablet each morning and one tablet at bedtime 02/12/19  Yes Branch, Alphonse Guild, MD  gabapentin (NEURONTIN) 600 MG tablet Take 1,200 mg by mouth 3 (three) times daily.   Yes [provider]  GRALISE 600 MG TABS Take 600 mg by mouth daily with supper.  11/23/18  Yes [provider]  insulin detemir (LEVEMIR) 100 UNIT/ML injection Inject 30 Units into the skin at bedtime. 30 units in the morning and 60 units at bedtime   Yes [provider]  losartan (COZAAR) 100 MG tablet Take 100 mg by mouth daily.   Yes [provider]  metFORMIN (GLUCOPHAGE) 1000 MG tablet Take 1,000 mg by mouth 2 (two) times daily with a meal.   Yes [provider]  metoprolol tartrate (LOPRESSOR) 25 MG tablet Take 1 tablet (25 mg total) by mouth 2 (two) times daily. 03/28/19  Yes BranchAlphonse Guild, MD  omeprazole (PRILOSEC) 20 MG capsule Take 20 mg by mouth daily.   Yes [provider]  pioglitazone (ACTOS) 45 MG tablet Take 45 mg by mouth daily.   Yes [provider]  rOPINIRole (REQUIP) 2 MG tablet Take 2 mg by mouth at bedtime.    Yes [provider]  rosuvastatin (CRESTOR) 40 MG tablet Take 1 tablet by mouth every evening. 03/22/18  Yes [provider]  sitaGLIPtin (JANUVIA) 100 MG tablet Take 50 mg by mouth daily.    Yes [provider]  traMADol (ULTRAM) 50 MG tablet Take 50-100 mg by mouth every 6 (six) hours as needed for moderate pain.  07/25/18  Yes [provider]  traZODone (DESYREL) 50 MG tablet Take 150 mg by mouth at bedtime.  07/03/18  Yes [provider]     Family History Family History  Problem Relation Age of Onset  . Heart attack Mother   . Diabetes Sister        Fx  . Heart defect Sister        Fx  . Arthritis Sister        Fx  . Asthma Sister        Fx  . Kidney disease Sister        Fx  . Pancreatitis Sister     Social History Social History   Tobacco Use  . Smoking status: Never Smoker  . Smokeless tobacco: Current User    Types: Chew  Substance Use Topics  . Alcohol use: No  . Drug use: No     Allergies   Patient has no known allergies.   Review of Systems Review of Systems  Constitutional: Negative for chills and fever.  HENT: Negative for sore throat and trouble swallowing.   Eyes: Negative for visual  disturbance.  Respiratory: Positive for cough, shortness of breath and wheezing.   Cardiovascular: Positive for leg swelling. Negative for chest pain and palpitations.  Gastrointestinal: Negative for abdominal pain, constipation, diarrhea, nausea and vomiting.  Genitourinary: Negative for dysuria and flank pain.  Musculoskeletal: Negative for back pain, myalgias and neck pain.  Skin: Negative for rash and wound.  Neurological: Negative for weakness, light-headedness, numbness and headaches.  All other systems reviewed and are negative.    Physical Exam Updated Vital Signs BP 136/64   Pulse (!) 58   Temp 97.8 F (36.6 C) (Oral)   Resp 13   Wt 102.1 kg   SpO2 100%   BMI 35.24 kg/m   Physical Exam Vitals signs and nursing note reviewed.  Constitutional:      Appearance: He is well-developed.  HENT:     Head: Normocephalic and atraumatic.     Nose: Nose normal.     Mouth/Throat:     Mouth: Mucous membranes are moist.  Eyes:     Pupils: Pupils are equal, round, and reactive to light.  Neck:     Musculoskeletal: Normal range of motion and neck supple.  Cardiovascular:     Rate and Rhythm: Normal rate and regular rhythm.  Pulmonary:     Effort: Pulmonary effort is normal.     Breath sounds:  Wheezing present.     Comments: Diminished breath sounds with end expiratory wheezing especially in the left lung field. Abdominal:     General: Bowel sounds are normal.     Palpations: Abdomen is soft.     Tenderness: There is no abdominal tenderness. There is no guarding or rebound.     Hernia: A hernia is present.  Musculoskeletal: Normal range of motion.        General: No tenderness.     Right lower leg: Edema present.     Left lower leg: Edema present.     Comments: 1+ bilateral lower extremity pitting edema.  Distal pulses intact.  No calf tenderness or asymmetry.  Skin:    General: Skin is warm and dry.     Findings: No erythema or rash.  Neurological:     General: No focal deficit present.     Mental Status: He is alert and oriented to person, place, and time.  Psychiatric:        Behavior: Behavior normal.      ED Treatments / Results  Labs (all labs ordered are listed, but only abnormal results are displayed) Labs Reviewed  CBC WITH DIFFERENTIAL/PLATELET - Abnormal; Notable for the following components:      Result Value   WBC 11.0 (*)    RBC 3.34 (*)    Hemoglobin 9.8 (*)    HCT 32.2 (*)    Neutro Abs 9.0 (*)    All other components within normal limits  BRAIN NATRIURETIC PEPTIDE - Abnormal; Notable for the following components:   B Natriuretic Peptide 553.0 (*)    All other components within normal limits  COMPREHENSIVE METABOLIC PANEL - Abnormal; Notable for the following components:   Chloride 97 (*)    Glucose, Bld 110 (*)    All other components within normal limits  D-DIMER, QUANTITATIVE (NOT AT [facility]) - Abnormal; Notable for the following components:   D-Dimer, Quant 0.94 (*)    All other components within normal limits  SARS CORONAVIRUS 2 (HOSPITAL ORDER, [facility])    EKG EKG Interpretation  Date/Time:  Sunday May 19 2019 11:14:51 EDT Ventricular Rate:  63 PR Interval:    QRS Duration: 89 QT Interval:  384  QTC Calculation: 393 R Axis:   95 Text Interpretation:  Sinus or ectopic atrial rhythm Probable lateral infarct, age indeterminate Probable anteroseptal infarct, old Confirmed by Julianne Rice [phone]) on 05/19/2019 11:49:29 AM   Radiology Dg Chest Port 1 View  Result Date: 05/19/2019 CLINICAL DATA:  SOB, low O2 saturations EXAM: PORTABLE CHEST 1 VIEW COMPARISON:  Chest radiograph 07/16/2018, 11/19/2017 FINDINGS: Stable cardiomediastinal contours with enlarged heart size. There are diffusely increased bilateral interstitial markings likely representing mild pulmonary edema. Minimal bibasilar linear opacities likely atelectasis. No definite focal infiltrate. No pneumothorax or large pleural effusion. No acute finding in the visualized skeleton. IMPRESSION: Cardiomegaly with diffusely increased bilateral interstitial markings likely mild pulmonary edema, atypical infection not excluded. Electronically Signed   By: Audie Pinto [name]   On: 05/19/2019 11:39    Procedures Procedures (including critical care time)  Medications Ordered in ED Medications  albuterol (PROVENTIL,VENTOLIN) solution continuous neb (10 mg/hr Nebulization New Bag/Given 05/19/19 1407)  methylPREDNISolone sodium succinate (SOLU-MEDROL) 125 mg/2 mL injection 125 mg (has no administration in time range)  furosemide (LASIX) injection 80 mg (has no administration in time range)    CRITICAL CARE Performed by: Julianne Rice Total critical care time: 30 minutes Critical care time was exclusive of separately billable procedures and treating other patients. Critical care was necessary to treat or prevent imminent or life-threatening deterioration. Critical care was time spent personally by me on the following activities: development of treatment plan with patient and/or surrogate as well as nursing, discussions with consultants, evaluation of patient's response to treatment, examination of patient, obtaining history from patient  or surrogate, ordering and performing treatments and interventions, ordering and review of laboratory studies, ordering and review of radiographic studies, pulse oximetry and re-evaluation of patient's condition. Initial Impression / Assessment and Plan / ED Course  I have reviewed the triage vital signs and the nursing notes.  Pertinent labs & imaging results that were available during my care of the patient were reviewed by me and considered in my medical decision making (see chart for details).       Patient saturations dropped into the 60s when ambulating to the restroom off of oxygen.  Placed back on 3 L of oxygen via nasal cannula with improvement back into the 90s.  Blood pressure remained stable.  X-ray with evidence of edema with elevated BNP.  Patient also has wheezing on exam.  Suspect patient's hypoxia is multifactorial likely CHF/COPD exacerbation.  Patient is COVID negative.  He does have an elevated d-dimer and will get CT angio of his chest to rule out PE.  Discussed with hospitalist who will see patient in the emergency department and admit.   Final Clinical Impressions(s) / ED Diagnoses   Final diagnoses:  COPD with acute exacerbation ([place])  Acute pulmonary edema [facility])    ED Discharge Orders    None       Julianne Rice, MD 05/19/19 1430

## 2019-05-19 NOTE — ED Notes (Signed)
Pt has to have CT scan before going to dept 300 per Dr Tery Sanfilippo

## 2019-05-19 NOTE — ED Notes (Addendum)
Pt ambulated to restroom on room air, o2 sats were in the 60's upon arrival back to the room.  Placed on 3l n/c sats up to the 90's.  edp notified.

## 2019-05-19 NOTE — H&P (Signed)
History and Physical  Brett [name] [id number] DOB: [birth year] DOA: 05/19/2019  Referring physician: Lita Mains MD   PCP: [name], MD   Chief Complaint: Shortness of breath  HPI: Brett [name] is a 68 y.o. 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  Apparently he has been hypoxic since yesterday evening.  He has had increasing wheezing cough and shortness of breath.  He has a difficult time expectorating sputum due to Kartagener syndrome and reports that this is mostly unchanged.  He also reports that he has had some difficulty with breathing and short of breath with minimal exertion.  He denies having chest pain.  He denies fever.  He denies sick contacts.  He lives with his wife who is well at this time.  They became concerned when they checked his pulse ox at home and it was in the 70s.  Apparently they used his sister's old oxygen tank and he was able to use that until he ran out of oxygen and then he became hypoxic again.  That was when they brought him to the emergency department for further evaluation.  They also note that he has had a weight gain of about 20 pounds over the past several days with increasing edema in both legs and in the abdomen.  The patient reports that he has been taking his Lasix regularly.    ED course: WBC 11.0, BNP 553.0, hemoglobin 9.8, platelet count 398.  Chest x-ray with some pulmonary edema findings.  D-dimer 0.94.  SARS 2 coronavirus test negative.  Patient was started on continuous nebulizer treatment given Solu-Medrol 125 mg IV.  CTA chest was ordered to rule out pulmonary embolus.  Admission was requested for further management.  Review of Systems: All systems reviewed and apart from history of presenting illness, are negative.  Past Medical History:  Diagnosis Date  . Depression   . Dextrocardia   . Diabetes  mellitus   . H. pylori infection 11/09/2012   treated with pylera  . HTN (hypertension)    Cholesterol  . Iron deficiency anemia, unspecified 10/18/2012  . Neuropathy   . Pneumonia   . Situs inversus totalis   . Tachycardia    Past Surgical History:  Procedure Laterality Date  . CATARACT EXTRACTION, BILATERAL  2016  . COLONOSCOPY WITH ESOPHAGOGASTRODUODENOSCOPY (EGD) N/A 11/09/2012   Dr. 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 hpylori on bx- treated with pylera. TCS- normal rectum. 1 diminutive polyp in the mid descending segment. 1-30mm polyp in the mid desending segment o/w the remainder of the colonic mucosa appeared normal. tubular adenoma on bx  . GIVENS CAPSULE STUDY N/A 10/07/2013   no source for anemia or heme positive stool noted  . NECK SURGERY     bone spurs   Social History:  reports that he has never smoked. His smokeless tobacco use includes chew. He reports that he does not drink alcohol or use drugs.  No Known Allergies  Family History  Problem Relation Age of Onset  . Heart attack Mother   . Diabetes Sister        Fx  . Heart defect Sister        Fx  . Arthritis Sister        Fx  . Asthma Sister        Fx  .  Kidney disease Sister        Fx  . Pancreatitis Sister     Prior to Admission medications   Medication Sig Start Date End Date Taking? Authorizing Provider  albuterol (PROAIR HFA) 108 (90 Base) MCG/ACT inhaler Inhale 2 puffs into the lungs every 4 (four) hours as needed for wheezing or shortness of breath. 09/27/16  Yes Valentina Shaggy, MD  aspirin EC 81 MG tablet Take 81 mg by mouth daily.   Yes [provider]  budesonide-formoterol (SYMBICORT) 160-4.5 MCG/ACT inhaler Inhale 2 puffs into the lungs 2 (two) times daily. Patient taking differently: Inhale 2 puffs into the lungs daily as needed (for shortness of breath).  09/27/16  Yes Valentina Shaggy, MD  CINNAMON PO Take 2,000 tablets by mouth daily.   Yes [provider]  DULoxetine (CYMBALTA) 60 MG capsule Take 60 mg by mouth daily.   Yes [provider]  fenofibrate (TRICOR) 145 MG tablet Take 145 mg by mouth every evening.  05/28/18  Yes [provider]  ferrous sulfate 325 (65 FE) MG tablet Take 325-650 mg by mouth daily with breakfast. Take two tablets on Monday Wednesday, Friday. And 1 tablet on all other days   Yes [provider]  folic acid (FOLVITE) 1 MG tablet Take 1 mg by mouth daily.   Yes [provider]  furosemide (LASIX) 40 MG tablet Take 1 tablet (40 mg total) by mouth 2 (two) times daily. Patient taking differently: Take 30-40 mg by mouth 2 (two) times daily. Take one and a half tablet each morning and one tablet at bedtime 02/12/19  Yes Branch, Alphonse Guild, MD  gabapentin (NEURONTIN) 600 MG tablet Take 1,200 mg by mouth 3 (three) times daily.   Yes [provider]  GRALISE 600 MG TABS Take 600 mg by mouth daily with supper.  11/23/18  Yes [provider]  insulin detemir (LEVEMIR) 100 UNIT/ML injection Inject 30 Units into the skin at bedtime. 30 units in the morning and 60 units at bedtime   Yes [provider]  losartan (COZAAR) 100 MG tablet Take 100 mg by mouth daily.   Yes [provider]  metFORMIN (GLUCOPHAGE) 1000 MG tablet Take 1,000 mg by mouth 2 (two) times daily with a meal.   Yes [provider]  metoprolol tartrate (LOPRESSOR) 25 MG tablet Take 1 tablet (25 mg total) by mouth 2 (two) times daily. 03/28/19  Yes BranchAlphonse Guild, MD  omeprazole (PRILOSEC) 20 MG capsule Take 20 mg by mouth daily.   Yes [provider]  pioglitazone (ACTOS) 45 MG tablet Take 45 mg by mouth daily.   Yes [provider]  rOPINIRole (REQUIP) 2 MG tablet Take 2 mg by mouth at bedtime.    Yes [provider]  rosuvastatin (CRESTOR) 40 MG tablet Take 1 tablet by mouth  every evening. 03/22/18  Yes [provider]  sitaGLIPtin (JANUVIA) 100 MG tablet Take 50 mg by mouth daily.    Yes [provider]  traMADol (ULTRAM) 50 MG tablet Take 50-100 mg by mouth every 6 (six) hours as needed for moderate pain.  07/25/18  Yes [provider]  traZODone (DESYREL) 50 MG tablet Take 150 mg by mouth at bedtime.  07/03/18  Yes [provider]   Physical Exam: Vitals:   05/19/19 1400 05/19/19 1407 05/19/19 1415 05/19/19 1500  BP: 110/72     Pulse:   (!) 58 62  Resp:   13  15  Temp:      TempSrc:      SpO2:  92% 100% 99%  Weight:        General exam: Moderately built and nourished patient, lying comfortably supine on the gurney in no obvious distress.  Head, eyes and ENT: Nontraumatic and normocephalic. Pupils equally reacting to light and accommodation. Oral mucosa moist.  Neck: Supple. No JVD, carotid bruit or thyromegaly.  Lymphatics: No lymphadenopathy.  Respiratory system: Clear to auscultation. No increased work of breathing.  Cardiovascular system: dextrocardia, normal S1 and S2 heard, RRR.  Trace pedal edema bilateral lower extremities.  Gastrointestinal system: Abdomen is nondistended, soft and nontender. Normal bowel sounds heard. No organomegaly or masses appreciated.  Central nervous system: Alert and oriented. No focal neurological deficits.  Extremities: Symmetric 5 x 5 power. Peripheral pulses symmetrically felt.   Skin: No rashes or acute findings.  Musculoskeletal system: No cords palpated bilateral lower extremities.  Normal pulses palpated bilateral lower extremities.  Negative exam.  Psychiatry: Pleasant and cooperative.  Slightly irritable.  Labs on Admission:  Basic Metabolic Panel: Recent Labs  Lab 05/19/19 1116  NA 136  K 5.1  CL 97*  CO2 29  GLUCOSE 110*  BUN 15  CREATININE 1.12  CALCIUM 9.5   Liver Function Tests: Recent Labs  Lab 05/19/19 1116  AST 18  ALT 14  ALKPHOS 48   BILITOT 0.3  PROT 7.2  ALBUMIN 3.8   No results for input(s): LIPASE, AMYLASE in the last 168 hours. No results for input(s): AMMONIA in the last 168 hours. CBC: Recent Labs  Lab 05/19/19 1116  WBC 11.0*  NEUTROABS 9.0*  HGB 9.8*  HCT 32.2*  MCV 96.4  PLT 398   Cardiac Enzymes: No results for input(s): CKTOTAL, CKMB, CKMBINDEX, TROPONINI in the last 168 hours.  BNP (last 3 results) No results for input(s): PROBNP in the last 8760 hours. CBG: No results for input(s): GLUCAP in the last 168 hours.  Radiological Exams on Admission: Dg Chest Port 1 View  Result Date: 05/19/2019 CLINICAL DATA:  SOB, low O2 saturations EXAM: PORTABLE CHEST 1 VIEW COMPARISON:  Chest radiograph 07/16/2018, 11/19/2017 FINDINGS: Stable cardiomediastinal contours with enlarged heart size. There are diffusely increased bilateral interstitial markings likely representing mild pulmonary edema. Minimal bibasilar linear opacities likely atelectasis. No definite focal infiltrate. No pneumothorax or large pleural effusion. No acute finding in the visualized skeleton. IMPRESSION: Cardiomegaly with diffusely increased bilateral interstitial markings likely mild pulmonary edema, atypical infection not excluded. Electronically Signed   By: Audie Pinto [name]   On: 05/19/2019 11:39   Assessment/Plan Active Problems:   Chewing tobacco nicotine dependence   GERD (gastroesophageal reflux disease)   Insulin-requiring or dependent type II diabetes mellitus (HCC)   Malabsorption of iron   Kartagener syndrome   Restrictive lung disease   CHF exacerbation (HCC)   Chronic diastolic CHF (congestive heart failure) (HCC)   COPD (chronic obstructive pulmonary disease) (HCC)   HTN (hypertension)   Acute respiratory failure with hypoxia (HCC)   Iron deficiency anemia   Leukocytosis   D-dimer, elevated   Dextrocardia  1. Acute on chronic respiratory failure with hypoxia-likely this is secondary to an acute COPD  exacerbation in addition to mild CHF exacerbation however given elevated d-dimer would follow CTA chest results to rule out pulmonary embolus.  Continue IV steroids, scheduled nebs and supplemental oxygen.  Added Mucinex.  Continue IV diuresis with Lasix.  Follow daily weights intake and output closely.  Monitor electrolytes  closely.  Will follow up results of CTA chest.   2. COPD exacerbation-treating as above with IV steroids, doxycycline 100 mg twice daily, scheduled nebs and supplemental oxygen. 3. GERD-Protonix ordered for GI protection. 4. Dyslipidemia-resume home medications. 5. Chewing tobacco nicotine dependence-nicotine patch ordered for cravings. 6. Essential hypertension- resume metoprolol, temporarily holding losartan in the setting of IV diuresis and soft blood pressures. 7. Chronic diastolic CHF exacerbation-treating with IV diuresis continue to follow intake and output closely.  Monitor daily weights. 8. Iron deficiency anemia-she is followed closely by Dr. Delton Coombes continue oral iron 325 mg every day with breakfast. 9. Type 2 diabetes mellitus insulin requiring- resumed basal insulin and added prandial and supplemental sliding scale insulin continue to monitor CBG closely.  DVT Prophylaxis: Subcu heparin Code Status: Full Family Communication: Present at bedside Disposition Plan: Inpatient for IV diuresis with Lasix and IV steroids  Time spent: 59 minutes  Clanford Wynetta Emery, MD [facility] How to contact the [facility] Attending or Consulting provider [place] or covering provider during after hours [place], for this patient?  1. Check the care team in [facility] and look for a) attending/consulting [facility] provider listed and b) the [facility] team listed 2. Log into [email or web address] and use [facility]'s universal password to access. If you do not have the password, please contact the hospital operator. 3. Locate the [facility] provider you are looking for under [facility] and page to a number  that you can be directly reached. 4. If you still have difficulty reaching the provider, please page the [facility] (Director on Call) for the Hospitalists listed on amion for assistance.

## 2019-05-20 ENCOUNTER — Inpatient Hospital Stay (HOSPITAL_COMMUNITY): Payer: Medicare HMO

## 2019-05-20 DIAGNOSIS — R7989 Other specified abnormal findings of blood chemistry: Secondary | ICD-10-CM

## 2019-05-20 DIAGNOSIS — F17229 Nicotine dependence, chewing tobacco, with unspecified nicotine-induced disorders: Secondary | ICD-10-CM

## 2019-05-20 LAB — GLUCOSE, CAPILLARY
Glucose-Capillary: 188 mg/dL — ABNORMAL HIGH (ref 70–99)
Glucose-Capillary: 106 mg/dL — ABNORMAL HIGH (ref 70–99)
Glucose-Capillary: 114 mg/dL — ABNORMAL HIGH (ref 70–99)
Glucose-Capillary: 125 mg/dL — ABNORMAL HIGH (ref 70–99)
Glucose-Capillary: 249 mg/dL — ABNORMAL HIGH (ref 70–99)

## 2019-05-20 LAB — MAGNESIUM: Magnesium: 1.9 mg/dL (ref 1.7–2.4)

## 2019-05-20 LAB — CBC WITH DIFFERENTIAL/PLATELET
Abs Immature Granulocytes: 0.06 10*3/uL (ref 0.00–0.07)
Basophils Absolute: 0 10*3/uL (ref 0.0–0.1)
Basophils Relative: 0 %
Eosinophils Absolute: 0 10*3/uL (ref 0.0–0.5)
Eosinophils Relative: 0 %
HCT: 32 % — ABNORMAL LOW (ref 39.0–52.0)
Hemoglobin: 9.9 g/dL — ABNORMAL LOW (ref 13.0–17.0)
Immature Granulocytes: 1 %
Lymphocytes Relative: 7 %
Lymphs Abs: 0.8 10*3/uL (ref 0.7–4.0)
MCH: 29.1 pg (ref 26.0–34.0)
MCHC: 30.9 g/dL (ref 30.0–36.0)
MCV: 94.1 fL (ref 80.0–100.0)
Monocytes Absolute: 0.7 10*3/uL (ref 0.1–1.0)
Monocytes Relative: 6 %
Neutro Abs: 10.7 10*3/uL — ABNORMAL HIGH (ref 1.7–7.7)
Neutrophils Relative %: 86 %
Platelets: 396 10*3/uL (ref 150–400)
RBC: 3.4 MIL/uL — ABNORMAL LOW (ref 4.22–5.81)
RDW: 14.6 % (ref 11.5–15.5)
WBC: 12.4 10*3/uL — ABNORMAL HIGH (ref 4.0–10.5)
nRBC: 0 % (ref 0.0–0.2)

## 2019-05-20 LAB — BASIC METABOLIC PANEL
Anion gap: 10 (ref 5–15)
BUN: 18 mg/dL (ref 8–23)
CO2: 32 mmol/L (ref 22–32)
Calcium: 9.4 mg/dL (ref 8.9–10.3)
Chloride: 95 mmol/L — ABNORMAL LOW (ref 98–111)
Creatinine, Ser: 1.01 mg/dL (ref 0.61–1.24)
GFR calc Af Amer: >60 mL/min (ref >60)
GFR calc non Af Amer: >60 mL/min (ref >60)
Glucose, Bld: 159 mg/dL — ABNORMAL HIGH (ref 70–99)
Potassium: 4.4 mmol/L (ref 3.5–5.1)
Sodium: 137 mmol/L (ref 135–145)

## 2019-05-20 MED ORDER — PHENYLEPHRINE HCL 0.5 % NA SOLN
1.0000 [drp] | Freq: Four times a day (QID) | NASAL | Status: DC | PRN
  Administered 2019-05-20: 21:00:00 1 [drp] via NASAL
  Filled 2019-05-20 (×2): qty 15

## 2019-05-20 MED ORDER — POTASSIUM CHLORIDE CRYS ER 20 MEQ PO TBCR
20.0000 meq | EXTENDED_RELEASE_TABLET | Freq: Every day | ORAL | Status: DC
  Administered 2019-05-20: 17:00:00 20 meq via ORAL
  Filled 2019-05-20: qty 1

## 2019-05-20 MED ORDER — FUROSEMIDE 10 MG/ML IJ SOLN
40.0000 mg | Freq: Every day | INTRAMUSCULAR | Status: DC
  Administered 2019-05-20 – 2019-05-21 (×2): 40 mg via INTRAVENOUS
  Filled 2019-05-20 (×2): qty 4

## 2019-05-20 MED ORDER — IPRATROPIUM-ALBUTEROL 0.5-2.5 (3) MG/3ML IN SOLN
3.0000 mL | Freq: Four times a day (QID) | RESPIRATORY_TRACT | Status: DC
  Administered 2019-05-20 – 2019-05-21 (×3): 3 mL via RESPIRATORY_TRACT
  Filled 2019-05-20 (×2): qty 3

## 2019-05-20 NOTE — Plan of Care (Signed)
  Problem: Acute Rehab PT Goals(only PT should resolve) Goal: Patient Will Transfer Sit To/From Stand Outcome: Progressing Flowsheets (Taken 05/20/2019 1219) Patient will transfer sit to/from stand: with supervision Goal: Pt Will Transfer Bed To Chair/Chair To Bed Outcome: Progressing Flowsheets (Taken 05/20/2019 1219) Pt will Transfer Bed to Chair/Chair to Bed: min guard assist Goal: Pt Will Perform Standing Balance Or Pre-Gait Outcome: Progressing Flowsheets (Taken 05/20/2019 1219) Pt will perform standing balance or pre-gait:  with min guard assist  with unilateral UE support Goal: Pt Will Ambulate Outcome: Progressing Flowsheets (Taken 05/20/2019 1219) Pt will Ambulate:  25 feet  with min guard assist  with least restrictive assistive device Goal: Pt Will Go Up/Down Stairs Outcome: Progressing Flowsheets (Taken 05/20/2019 1219) Pt will Go Up / Down Stairs:  3-5 stairs  with rail(s)  with least restrictive assistive device  with min guard assist  Pamala Hurry D. Hartnett-Rands, MS, PT Per [place] [phone] 05/20/2019

## 2019-05-20 NOTE — Progress Notes (Signed)
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    MDM/Assessment & Plan:   1. Acute on chronic respiratory failure with hypoxia-likely this is secondary to an acute COPD exacerbation in addition to mild CHF exacerbation however given elevated d-dimer would follow CTA chest results to rule out pulmonary embolus.  Continue IV steroids, scheduled nebs and supplemental oxygen.  Added Mucinex.  Continue IV diuresis with Lasix.  Follow daily weights intake and output closely.  Monitor electrolytes closely.  Will follow up results of CTA chest.   2. COPD exacerbation-treating as above with IV steroids, doxycycline 100 mg twice daily, scheduled nebs and supplemental oxygen. 3. GERD-Protonix ordered for GI protection. 4. Dyslipidemia-resume home medications. 5. Chewing tobacco nicotine dependence-nicotine patch ordered for cravings. 6. Essential hypertension- resume metoprolol, temporarily holding losartan in the setting of IV diuresis and soft blood pressures. 7. Chronic diastolic CHF exacerbation-treating with IV diuresis continue to follow intake and output closely.  Monitor daily weights. 8. Iron deficiency anemia-she is followed closely by Dr. Delton Coombes continue oral iron 325 mg every day with breakfast. 9. Type 2 diabetes mellitus insulin requiring- resumed basal insulin and added prandial and supplemental sliding scale insulin continue to monitor CBG closely.  DVT Prophylaxis: Subcu heparin Code Status: Full Family Communication: Present at bedside Disposition Plan: Inpatient for IV diuresis with Lasix and IV steroids  Consultants:    Procedures:    Antimicrobials:  Doxycycline 05/19/19 >>  Subjective: Pt  reports persistent cough, wheezing and SOB. Denies diarrhea and chest pain.   Objective: Vitals:   05/19/19 2147 05/19/19 2323 05/20/19 0508 05/20/19 0724  BP: 133/63  (!) 149/57   Pulse: 73  (!) 57   Resp: 19  19   Temp: (!) 97.3 F (36.3 C)  97.8 F (36.6 C)   TempSrc: Oral  Oral   SpO2: 95% 93% 99% 94%  Weight:   102.6 kg   Height:        Intake/Output Summary (Last 24 hours) at 05/20/2019 1110 Last data filed at 05/20/2019 0500 Gross per 24 hour  Intake 360 ml  Output 2200 ml  Net -1840 ml   Filed Weights   05/19/19 1108 05/19/19 1651 05/20/19 0508  Weight: 102.1 kg 103.3 kg 102.6 kg     REVIEW OF SYSTEMS  As per history otherwise all reviewed and reported negative  Exam:  General exam: sitting up in chair, awake, alert, NAD.  Respiratory system: poor air movement, diffuse wheezing.  No increased work of breathing. Cardiovascular system: S1 & S2 heard. No JVD, murmurs, gallops, clicks or pedal edema. Gastrointestinal system: Abdomen is nondistended, soft and nontender. Normal bowel sounds heard. Central nervous system: Alert and oriented. No focal neurological deficits. Extremities: no CCE.  Data Reviewed: Basic Metabolic Panel: Recent Labs  Lab 05/19/19 1116 05/20/19 0518  NA 136 137  K 5.1 4.4  CL 97* 95*  CO2 29 32  GLUCOSE 110* 159*  BUN 15 18  CREATININE 1.12 1.01  CALCIUM 9.5 9.4  MG  --  1.9   Liver Function Tests: Recent Labs  Lab 05/19/19 1116  AST 18  ALT 14  ALKPHOS 48  BILITOT 0.3  PROT 7.2  ALBUMIN 3.8   No results  for input(s): LIPASE, AMYLASE in the last 168 hours. No results for input(s): AMMONIA in the last 168 hours. CBC: Recent Labs  Lab 05/19/19 1116 05/20/19 0518  WBC 11.0* 12.4*  NEUTROABS 9.0* 10.7*  HGB 9.8* 9.9*  HCT 32.2* 32.0*  MCV 96.4 94.1  PLT 398 396   Cardiac Enzymes: No results for input(s): CKTOTAL, CKMB, CKMBINDEX, TROPONINI in the last 168 hours. CBG (last 3)  Recent Labs    05/19/19 2150  05/20/19 0342 05/20/19 0736  GLUCAP 266* 188* 106*   Recent Results (from the past 240 hour(s))  SARS Coronavirus 2 [facility] (2-Rh) order, Performed in [facility] hospital lab) Nasopharyngeal Nasopharyngeal Swab     Status: None   Collection Time: 05/19/19 11:17 AM   Specimen: Nasopharyngeal Swab  Result Value Ref Range Status   SARS Coronavirus 2 NEGATIVE NEGATIVE Final    Comment: (NOTE) If result is NEGATIVE SARS-CoV-2 target nucleic acids are NOT DETECTED. The SARS-CoV-2 RNA is generally detectable in upper and lower  respiratory specimens during the acute phase of infection. The lowest  concentration of SARS-CoV-2 viral copies this assay can detect is 250  copies / mL. A negative result does not preclude SARS-CoV-2 infection  and should not be used as the sole basis for treatment or other  patient management decisions.  A negative result may occur with  improper specimen collection / handling, submission of specimen other  than nasopharyngeal swab, presence of viral mutation(s) within the  areas targeted by this assay, and inadequate number of viral copies  (<250 copies / mL). A negative result must be combined with clinical  observations, patient history, and epidemiological information. If result is POSITIVE SARS-CoV-2 target nucleic acids are DETECTED. The SARS-CoV-2 RNA is generally detectable in upper and lower  respiratory specimens dur ing the acute phase of infection.  Positive  results are indicative of active infection with SARS-CoV-2.  Clinical  correlation with patient history and other diagnostic information is  necessary to determine patient infection status.  Positive results do  not rule out bacterial infection or co-infection with other viruses. If result is PRESUMPTIVE POSTIVE SARS-CoV-2 nucleic acids MAY BE PRESENT.   A presumptive positive result was obtained on the submitted specimen  and confirmed on repeat testing.  While 2019 novel coronavirus    (SARS-CoV-2) nucleic acids may be present in the submitted sample  additional confirmatory testing may be necessary for epidemiological  and / or clinical management purposes  to differentiate between  SARS-CoV-2 and other Sarbecovirus currently known to infect humans.  If clinically indicated additional testing with an alternate test  methodology [phone]) is advised. The SARS-CoV-2 RNA is generally  detectable in upper and lower respiratory sp ecimens during the acute  phase of infection. The expected result is Negative. Fact Sheet for Patients:  StrictlyIdeas.no Fact Sheet for Healthcare Providers: BankingDealers.co.za This test is not yet approved or cleared by the [place] FDA and has been authorized for detection and/or diagnosis of SARS-CoV-2 by FDA under an Emergency Use Authorization (EUA).  This EUA will remain in effect (meaning this test can be used) for the duration of the COVID-19 declaration under Section 564(b)(1) of the Act, 21 U.S.C. section 360bbb-3(b)(1), unless the authorization is terminated or revoked sooner. Performed at [facility], [address] [place], [place] [address]      Studies: Ct Angio Chest Pe W And/or Wo Contrast  Result Date: 05/19/2019 CLINICAL DATA:  Hypoxemia, shortness of breath with exertion, coughing, elevated D-dimer, question  pulmonary embolism; history situs inversus totalis, Kartagener syndrome, hypertension, diabetes mellitus EXAM: CT ANGIOGRAPHY CHEST WITH CONTRAST TECHNIQUE: Multidetector CT imaging of the chest was performed using the standard protocol during bolus administration of intravenous contrast. Multiplanar CT image reconstructions and MIPs were obtained to evaluate the vascular anatomy. CONTRAST:  177mL OMNIPAQUE IOHEXOL 350 MG/ML SOLN IV COMPARISON:  CT chest 04/22/2018 FINDINGS: Cardiovascular: RIGHT-side aortic arch and dextrocardia. Minimal pericardial fluid. Minimal  prominence of cardiac chambers. Aorta normal caliber without aneurysm or dissection. Mild atherosclerotic calcification of aorta and coronary arteries. Pulmonary arteries adequately opacified and patent. No evidence of pulmonary embolism. Mediastinum/Nodes: Esophagus unremarkable. Base of cervical region normal appearance. Scattered normal size mediastinal lymph nodes. Mildly enlarged AP window node 12 mm short axis image 41, previously 10 mm. No additional thoracic adenopathy. Lungs/Pleura: Scattered interseptal thickening and mosaic attenuation in both lungs which could reflect minimal edema. Chronic marked atelectasis of RIGHT lower lobe. Central peribronchial thickening. No pleural effusion or pneumothorax. Upper Abdomen: Situs inversus.  No upper abdominal abnormalities. Musculoskeletal: Prior cervical spine fusion. No acute osseous findings. Minimal superior endplate compression deformity of L1, age indeterminate in appearance but new since 2019. Review of the MIP images confirms the above findings. IMPRESSION: No evidence of pulmonary embolism. Situs inversus totalis. Bronchitic changes with chronic atelectasis of RIGHT lower lobe. Interseptal thickening and diffuse mosaic attenuation throughout both lungs, could represent edema or less likely infection. Single minimally enlarged para-aortic node 12 mm short axis, nonspecific. Aortic Atherosclerosis (ICD10-I70.0). Electronically Signed   By: Lavonia Dana [name]   On: 05/19/2019 16:55   Portable Chest 1 View  Result Date: 05/20/2019 CLINICAL DATA:  Decreased oxygen saturation over the past 4 days. Cough and shortness of breath with exertion. EXAM: PORTABLE CHEST 1 VIEW COMPARISON:  Single-view of the chest and CT chest 05/19/2019. PA and lateral chest 07/16/2018. FINDINGS: Situs inversus is seen as on the prior studies. Heart size is upper normal. There is pulmonary vascular congestion. No consolidative process, pneumothorax or effusion. Aortic  atherosclerosis. No acute or focal bony abnormality. IMPRESSION: Pulmonary vascular congestion. Atherosclerosis. Situs inversus. Electronically Signed   By: Inge Rise [name]   On: 05/20/2019 08:43   Dg Chest Port 1 View  Result Date: 05/19/2019 CLINICAL DATA:  SOB, low O2 saturations EXAM: PORTABLE CHEST 1 VIEW COMPARISON:  Chest radiograph 07/16/2018, 11/19/2017 FINDINGS: Stable cardiomediastinal contours with enlarged heart size. There are diffusely increased bilateral interstitial markings likely representing mild pulmonary edema. Minimal bibasilar linear opacities likely atelectasis. No definite focal infiltrate. No pneumothorax or large pleural effusion. No acute finding in the visualized skeleton. IMPRESSION: Cardiomegaly with diffusely increased bilateral interstitial markings likely mild pulmonary edema, atypical infection not excluded. Electronically Signed   By: Audie Pinto [name]   On: 05/19/2019 11:39     Scheduled Meds:  aspirin EC  81 mg Oral Daily   doxycycline  100 mg Oral Q12H   DULoxetine  60 mg Oral Daily   fenofibrate  54 mg Oral QPM   ferrous sulfate  325 mg Oral Q breakfast   folic acid  1 mg Oral Daily   gabapentin  600 mg Oral Q supper   guaiFENesin  1,200 mg Oral BID   heparin  5,000 Units Subcutaneous Q8H   insulin aspart  0-15 Units Subcutaneous TID WC   insulin aspart  0-5 Units Subcutaneous QHS   insulin aspart  6 Units Subcutaneous TID WC   insulin detemir  30 Units Subcutaneous QHS   ipratropium-albuterol  3 mL Nebulization Q4H while awake   methylPREDNISolone (SOLU-MEDROL) injection  40 mg Intravenous TID   metoprolol tartrate  25 mg Oral BID   mometasone-formoterol  2 puff Inhalation BID   nicotine  21 mg Transdermal Daily   pantoprazole  40 mg Oral Daily   rOPINIRole  2 mg Oral QHS   rosuvastatin  40 mg Oral QPM   sodium chloride flush  3 mL Intravenous Q12H   traZODone  150 mg Oral QHS   Continuous Infusions:  sodium  chloride     albuterol Stopped (05/19/19 1526)    Active Problems:   Chewing tobacco nicotine dependence   GERD (gastroesophageal reflux disease)   Insulin-requiring or dependent type II diabetes mellitus (HCC)   Malabsorption of iron   Kartagener syndrome   Restrictive lung disease   CHF exacerbation (HCC)   Chronic diastolic CHF (congestive heart failure) (HCC)   COPD (chronic obstructive pulmonary disease) (HCC)   HTN (hypertension)   Acute respiratory failure with hypoxia (HCC)   Iron deficiency anemia   Leukocytosis   D-dimer, elevated   Dextrocardia  Time spent:   Irwin Brakeman, MD [facility] 05/20/2019, 11:10 AM    LOS: 1 day  How to contact the [facility] Attending or Consulting provider 7A - 7P or covering provider during after hours [place], for this patient?  1. Check the care team in [facility] and look for a) attending/consulting TRH provider listed and b) the [facility] team listed 2. Log into [email or web address] and use [place] universal password to access. If you do not have the password, please contact the hospital operator. 3. Locate the [facility] provider you are looking for under [facility] and page to a number that you can be directly reached. 4. If you still have difficulty reaching the provider, please page the [facility] (Director on Call) for the Hospitalists listed on amion for assistance.

## 2019-05-20 NOTE — Evaluation (Signed)
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    Clinical Impression  Pt admitted with above diagnosis. Wife present throughout session. Patient agreeable to participating in evaluation. Patient currently on 3 LPM O2. Patient sitting at EOB upon therapist arrival. Patient requiring min guard for all mobility for safety. During ambulation with RW, patient short of breathe and somewhat unsteady on his feet with fluctuating tone in his lower extremities. Patient used to walking with SPC and did not feel comfortable walking with RW. Will need more assessment of SPC and/or training on RW as his oxygenation improves. Pt currently with functional limitations due to the deficits listed below (see PT Problem List). Pt will benefit from skilled PT to increase their independence and safety with mobility to allow discharge to the venue listed below.       Follow Up Recommendations Home health PT;Outpatient PT;Supervision - Intermittent    Equipment Recommendations  Rolling walker with 5" wheels    Recommendations for Other Services       Precautions / Restrictions Precautions Precautions: Fall Restrictions Weight Bearing Restrictions: No      Mobility  Bed Mobility               General bed mobility comments: patient sitting at EOB when PT arrived  Transfers Overall transfer level: Needs assistance Equipment used: Rolling walker (2 wheeled) Transfers: Sit to/from [facility] Sit to Stand: Min guard Stand pivot transfers: Min guard       General transfer comment: Patient pushed up on RW with bilateral forearms  Ambulation/Gait Ambulation/Gait assistance: Min  guard Gait Distance (Feet): 20 Feet Assistive device: Rolling walker (2 wheeled) Gait Pattern/deviations: Wide base of support;Step-through pattern;Decreased step length - right;Decreased step length - left;Decreased stride length;Ataxic;Trunk flexed Gait velocity: decreased   General Gait Details: slow, labored, unsteady gait with RW  Stairs            Wheelchair Mobility    Modified Rankin (Stroke Patients Only)       Balance Overall balance assessment: Needs assistance Sitting-balance support: Bilateral upper extremity supported;Feet supported Sitting balance-Leahy Scale: Good     Standing balance support: Bilateral upper extremity supported;During functional activity Standing balance-Leahy Scale: Fair Standing balance comment: fair/poor with RW; legs unsteady during weight bearing                             Pertinent Vitals/Pain Pain Assessment: 0-10 Pain Score: 7  Pain Location: tightness in left side of chest Pain Descriptors / Indicators: Tightness Pain Intervention(s): Limited activity within patient's tolerance;Monitored during session    Home Living Family/patient expects to be discharged to:: Private residence Living Arrangements: Spouse/significant other Available Help at Discharge: Family;Available PRN/intermittently(patient and wife care for handicap sister in their home.) Type of Home: House Home Access: Stairs to enter Entrance Stairs-Rails: Left Entrance Stairs-Number of Steps: 4 Home Layout: Able to live on main level with bedroom/bathroom Home Equipment: Bedside commode;Walker - standard;Cane - single point      Prior Function Level of Independence: Independent with assistive device(s);Needs assistance   Gait / Transfers Assistance Needed: [facility] for household and community distances; reports a fall within the last 6 months;  wife reports his legs bounce when he walks.  ADL's / Homemaking Assistance Needed: Independent  Comments:  patient still drives and does grocery shopping     Hand Dominance   Dominant Hand: Right    Extremity/Trunk Assessment   Upper Extremity Assessment Upper Extremity Assessment: Generalized weakness    Lower Extremity Assessment Lower Extremity Assessment: Generalized weakness;RLE deficits/detail;LLE deficits/detail RLE Deficits / Details: peripheral neuropathy reported by wife RLE Sensation: history of peripheral neuropathy LLE Deficits / Details: peripheral neuropathy reported by wife LLE Sensation: history of peripheral neuropathy    Cervical / Trunk Assessment Cervical / Trunk Assessment: Kyphotic  Communication   Communication: Expressive difficulties(COPD, somewhat difficult to understand at times.)  Cognition Arousal/Alertness: Awake/alert Behavior During Therapy: WFL for tasks assessed/performed Overall Cognitive Status: Within Functional Limits for tasks assessed                                        General Comments      Exercises     Assessment/Plan    PT Assessment Patient needs continued PT services  PT Problem List Decreased strength;Decreased mobility;Impaired tone;Decreased coordination;Decreased activity tolerance;Decreased balance;Decreased knowledge of use of DME;Pain;Impaired sensation       PT Treatment Interventions DME instruction;Therapeutic activities;Gait training;Therapeutic exercise;Patient/family education;Stair training    PT Goals (Current goals can be found in the Care Plan section)  Acute Rehab PT Goals Patient Stated Goal: Go home and get back to his routine. PT Goal Formulation: With patient/family Time For Goal Achievement: 06/03/19 Potential to Achieve Goals: Fair    Frequency Min 3X/week   Barriers to discharge        Co-evaluation               AM-PAC PT "6 Clicks" Mobility  Outcome Measure Help needed turning from your back to your side while in a flat bed without using bedrails?: A  Little Help needed moving from lying on your back to sitting on the side of a flat bed without using bedrails?: A Little Help needed moving to and from a bed to a chair (including a wheelchair)?: A Little Help needed standing up from a chair using your arms (e.g., wheelchair or bedside chair)?: A Little Help needed to walk in hospital room?: A Little Help needed climbing 3-5 steps with a railing? : A Lot 6 Click Score: 17    End of Session Equipment Utilized During Treatment: Gait belt;Oxygen(3LPM O2) Activity Tolerance: Patient limited by fatigue Patient left: in chair;with family/visitor present;with call bell/phone within reach;with chair alarm set Nurse Communication: Mobility status PT Visit Diagnosis: Unsteadiness on feet (R26.81);History of falling (Z91.81);Other abnormalities of gait and mobility (R26.89);Muscle weakness (generalized) (M62.81);Difficulty in walking, not elsewhere classified (R26.2);Other symptoms and signs involving the nervous system (R29.898)    Time: [id number] PT Time Calculation (min) (ACUTE ONLY): 30 min   Charges:   PT Evaluation $PT Eval Moderate Complexity: 1 Mod PT Treatments $Gait Training: 8-22 mins        Floria Raveling. Hartnett-Rands, MS, PT Per [place] [id number] 05/20/2019, 12:15 PM

## 2019-05-20 NOTE — Progress Notes (Signed)
Pt with PT. 

## 2019-05-21 DIAGNOSIS — K909 Intestinal malabsorption, unspecified: Secondary | ICD-10-CM

## 2019-05-21 DIAGNOSIS — I10 Essential (primary) hypertension: Secondary | ICD-10-CM

## 2019-05-21 LAB — HEMOGLOBIN A1C
Hgb A1c MFr Bld: 6.4 % — ABNORMAL HIGH (ref 4.8–5.6)
Mean Plasma Glucose: 137 mg/dL

## 2019-05-21 LAB — GLUCOSE, CAPILLARY
Glucose-Capillary: 172 mg/dL — ABNORMAL HIGH (ref 70–99)
Glucose-Capillary: 113 mg/dL — ABNORMAL HIGH (ref 70–99)
Glucose-Capillary: 105 mg/dL — ABNORMAL HIGH (ref 70–99)

## 2019-05-21 LAB — HIV ANTIBODY (ROUTINE TESTING W REFLEX): HIV Screen 4th Generation wRfx: NONREACTIVE

## 2019-05-21 MED ORDER — BUDESONIDE-FORMOTEROL FUMARATE 160-4.5 MCG/ACT IN AERO: 2.0000 | INHALATION_SPRAY | Freq: Two times a day (BID) | RESPIRATORY_TRACT | 5 refills | Status: DC

## 2019-05-21 MED ORDER — DOXYCYCLINE HYCLATE 100 MG PO TABS: 100.0000 mg | ORAL_TABLET | Freq: Two times a day (BID) | ORAL | 0 refills | Status: AC

## 2019-05-21 MED ORDER — PREDNISONE 20 MG PO TABS: ORAL_TABLET | ORAL | 0 refills | Status: DC

## 2019-05-21 MED ORDER — GUAIFENESIN ER 600 MG PO TB12: 1200.0000 mg | ORAL_TABLET | Freq: Two times a day (BID) | ORAL | 0 refills | Status: AC

## 2019-05-21 MED ORDER — FUROSEMIDE 40 MG PO TABS: 30.0000 mg | ORAL_TABLET | Freq: Two times a day (BID) | ORAL | Status: DC

## 2019-05-21 NOTE — Discharge Summary (Signed)
Physician Discharge Summary  Brett [name] [id number] DOB: 05-16-1951 DOA: 05/19/2019  PCP: [name], MD Cardiologist: Harl Bowie Pulmonologist: Luan Pulling  Admit date: 05/19/2019 Discharge date: 05/21/2019  Admitted From:  Home  Disposition: Home   Recommendations for Outpatient Follow-up:  1. Follow up with PCP in 1 weeks 2. Follow up with Dr. Luan Pulling in 2 weeks 3. Follow up with Dr. Harl Bowie in 2 weeks  Discharge Condition: STABLE   CODE STATUS: FULL    Brief Hospitalization Summary: Please see all hospital notes, images, labs for full details of the hospitalization. HPI: Brett [name] is a 68 y.o. 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  Apparently he has been hypoxic since yesterday evening.  He has had increasing wheezing cough and shortness of breath.  He has a difficult time expectorating sputum due to Kartagener syndrome and reports that this is mostly unchanged.  He also reports that he has had some difficulty with breathing and short of breath with minimal exertion.  He denies having chest pain.  He denies fever.  He denies sick contacts.  He lives with his wife who is well at this time.  They became concerned when they checked his pulse ox at home and it was in the 70s.  Apparently they used his sister's old oxygen tank and he was able to use that until he ran out of oxygen and then he became hypoxic again.  That was when they brought him to the emergency department for further evaluation.  They also note that he has had a weight gain of about 20 pounds over the past several days with increasing edema in both legs and in the abdomen.  The patient reports that he has been taking his Lasix regularly.    ED course: WBC 11.0, BNP 553.0, hemoglobin 9.8, platelet count 398.  Chest x-ray with some pulmonary edema findings.  D-dimer 0.94.  SARS 2 coronavirus  test negative.  Patient was started on continuous nebulizer treatment given Solu-Medrol 125 mg IV.  CTA chest was ordered to rule out pulmonary embolus.  Admission was requested for further management.  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    MDM/Assessment & Plan:   1. Acute on chronic respiratory failure with hypoxia-Resolved.  Likely this is secondary to an acute COPD exacerbation in addition to mild CHF exacerbation however given elevated d-dimer, CTA chest ruled out pulmonary embolus. Treated with IV steroids, scheduled nebs and supplemental oxygen. Added Mucinex. Treated with IV diuresis with Lasix. Follow daily weights intake and output closely. Monitor electrolytes closely.  2. COPD exacerbation-treated as above with IV steroids, doxycycline 100 mg twice daily, scheduled nebs and supplemental oxygen. DC home on steroid taper.  Home O2 eval and home oxygen if qualifies.  3. GERD-Protonix ordered for GI protection. 4. Dyslipidemia-resume home medications. 5. Chewing tobacco nicotine dependence-nicotine patch ordered for cravings. 6. Essential hypertension-resume metoprolol, temporarily holding losartan in the setting of IV diuresis and soft blood pressures. 7. Chronic diastolic CHF exacerbation-treating with IV diuresis continue to follow intake and output closely. Monitor daily weights. 8. Iron deficiency anemia-he is followed closely by Dr. Delton Coombes continue oral iron 325 mg every day with breakfast. 9. Type 2 diabetes mellitus insulin requiring-resumed basal insulin and  added prandial and supplemental sliding scale insulin. Resumed home meds at DC but DC actos as he is having CHF exacerbation.   DVT Prophylaxis:Subcu heparin Code Status:Full Family Communication:Present at bedside Disposition  Plan:Home   Discharge Diagnoses:  Active Problems:   Chewing tobacco nicotine dependence   GERD (gastroesophageal reflux disease)   Insulin-requiring or dependent type II diabetes mellitus (HCC)   Malabsorption of iron   Kartagener syndrome   Restrictive lung disease   CHF exacerbation (HCC)   Chronic diastolic CHF (congestive heart failure) (HCC)   COPD (chronic obstructive pulmonary disease) (HCC)   HTN (hypertension)   Acute respiratory failure with hypoxia (HCC)   Iron deficiency anemia   Leukocytosis   D-dimer, elevated   Dextrocardia  Discharge Instructions: Discharge Instructions    (HEART FAILURE PATIENTS) Call MD:  Anytime you have any of the following symptoms: 1) 3 pound weight gain in 24 hours or 5 pounds in 1 week 2) shortness of breath, with or without a dry hacking cough 3) swelling in the hands, feet or stomach 4) if you have to sleep on extra pillows at night in order to breathe.   Complete by: As directed    Diet - low sodium heart healthy   Complete by: As directed    Increase activity slowly   Complete by: As directed      Allergies as of 05/21/2019   No Known Allergies     Medication List    STOP taking these medications   pioglitazone 45 MG tablet Commonly known as: ACTOS     TAKE these medications   albuterol 108 (90 Base) MCG/ACT inhaler Commonly known as: ProAir HFA Inhale 2 puffs into the lungs every 4 (four) hours as needed for wheezing or shortness of breath.   aspirin EC 81 MG tablet Take 81 mg by mouth daily.   budesonide-formoterol 160-4.5 MCG/ACT inhaler Commonly known as: Symbicort Inhale 2 puffs into the lungs 2 (two) times daily. What changed:   when to take this  reasons to take this   CINNAMON PO Take 2,000 tablets by mouth daily.   doxycycline 100 MG tablet Commonly known as: VIBRA-TABS Take 1 tablet (100 mg total) by mouth every 12 (twelve) hours for 5 days.   DULoxetine 60 MG capsule Commonly known as:  CYMBALTA Take 60 mg by mouth daily.   fenofibrate 145 MG tablet Commonly known as: TRICOR Take 145 mg by mouth every evening.   ferrous sulfate 325 (65 FE) MG tablet Take 325-650 mg by mouth daily with breakfast. Take two tablets on Monday Wednesday, Friday. And 1 tablet on all other days   folic acid 1 MG tablet Commonly known as: FOLVITE Take 1 mg by mouth daily.   furosemide 40 MG tablet Commonly known as: Lasix Take 1 tablet (40 mg total) by mouth 2 (two) times daily. What changed:   how much to take  additional instructions   gabapentin 600 MG tablet Commonly known as: NEURONTIN Take 1,200 mg by mouth 3 (three) times daily.   Gralise 600 MG Tabs Generic drug: Gabapentin (Once-Daily) Take 600 mg by mouth daily with supper.   guaiFENesin 600 MG 12 hr tablet Commonly known as: MUCINEX Take 2 tablets (1,200 mg total) by mouth 2 (two) times daily for 7 days.   insulin detemir 100 UNIT/ML injection Commonly known as: LEVEMIR Inject 30 Units into the skin at bedtime. 30 units in the morning and 60 units at bedtime   losartan 100 MG tablet  Commonly known as: COZAAR Take 100 mg by mouth daily.   metFORMIN 1000 MG tablet Commonly known as: GLUCOPHAGE Take 1,000 mg by mouth 2 (two) times daily with a meal.   metoprolol tartrate 25 MG tablet Commonly known as: LOPRESSOR Take 1 tablet (25 mg total) by mouth 2 (two) times daily.   omeprazole 20 MG capsule Commonly known as: PRILOSEC Take 20 mg by mouth daily.   predniSONE 20 MG tablet Commonly known as: DELTASONE Take 3 PO QAM x3days, 2 PO QAM x3days, 1 PO QAM x3days   rOPINIRole 2 MG tablet Commonly known as: REQUIP Take 2 mg by mouth at bedtime.   rosuvastatin 40 MG tablet Commonly known as: CRESTOR Take 1 tablet by mouth every evening.   sitaGLIPtin 100 MG tablet Commonly known as: JANUVIA Take 50 mg by mouth daily.   traMADol 50 MG tablet Commonly known as: ULTRAM Take 50-100 mg by mouth every 6  (six) hours as needed for moderate pain.   traZODone 50 MG tablet Commonly known as: DESYREL Take 150 mg by mouth at bedtime.            Durable Medical Equipment  (From admission, onward)         Start     Ordered   05/21/19 0835  For home use only DME oxygen  Once    Question Answer Comment  Length of Need Lifetime   Mode or (Route) Nasal cannula   Liters per Minute 3   Frequency Continuous (stationary and portable oxygen unit needed)   Oxygen conserving device Yes   Oxygen delivery system Gas      05/21/19 0834   05/20/19 1717  For home use only DME Walker rolling  Once    Question:  Patient needs a walker to treat with the following condition  Answer:  Gait instability   05/20/19 1716         Follow-up Information    [name], MD. Schedule an appointment as soon as possible for a visit in 1 week(s).   Specialty: Internal Medicine Contact information: [address] [place] [address] [phone]        Arnoldo Lenis, MD .   Specialty: Cardiology Contact information: [address] [place] [address] [phone]        Sinda Du, MD. Schedule an appointment as soon as possible for a visit in 2 week(s).   Specialty: Pulmonary Disease Contact information: [address] [place]  [address] [phone]          No Known Allergies Allergies as of 05/21/2019   No Known Allergies     Medication List    STOP taking these medications   pioglitazone 45 MG tablet Commonly known as: ACTOS     TAKE these medications   albuterol 108 (90 Base) MCG/ACT inhaler Commonly known as: ProAir HFA Inhale 2 puffs into the lungs every 4 (four) hours as needed for wheezing or shortness of breath.   aspirin EC 81 MG tablet Take 81 mg by mouth daily.   budesonide-formoterol 160-4.5 MCG/ACT inhaler Commonly known as: Symbicort Inhale 2 puffs into the lungs 2 (two) times daily. What changed:   when to take  this  reasons to take this   CINNAMON PO Take 2,000 tablets by mouth daily.   doxycycline 100 MG tablet Commonly known as: VIBRA-TABS Take 1 tablet (100 mg total) by mouth every 12 (twelve) hours for 5 days.   DULoxetine 60 MG  capsule Commonly known as: CYMBALTA Take 60 mg by mouth daily.   fenofibrate 145 MG tablet Commonly known as: TRICOR Take 145 mg by mouth every evening.   ferrous sulfate 325 (65 FE) MG tablet Take 325-650 mg by mouth daily with breakfast. Take two tablets on Monday Wednesday, Friday. And 1 tablet on all other days   folic acid 1 MG tablet Commonly known as: FOLVITE Take 1 mg by mouth daily.   furosemide 40 MG tablet Commonly known as: Lasix Take 1 tablet (40 mg total) by mouth 2 (two) times daily. What changed:   how much to take  additional instructions   gabapentin 600 MG tablet Commonly known as: NEURONTIN Take 1,200 mg by mouth 3 (three) times daily.   Gralise 600 MG Tabs Generic drug: Gabapentin (Once-Daily) Take 600 mg by mouth daily with supper.   guaiFENesin 600 MG 12 hr tablet Commonly known as: MUCINEX Take 2 tablets (1,200 mg total) by mouth 2 (two) times daily for 7 days.   insulin detemir 100 UNIT/ML injection Commonly known as: LEVEMIR Inject 30 Units into the skin at bedtime. 30 units in the morning and 60 units at bedtime   losartan 100 MG tablet Commonly known as: COZAAR Take 100 mg by mouth daily.   metFORMIN 1000 MG tablet Commonly known as: GLUCOPHAGE Take 1,000 mg by mouth 2 (two) times daily with a meal.   metoprolol tartrate 25 MG tablet Commonly known as: LOPRESSOR Take 1 tablet (25 mg total) by mouth 2 (two) times daily.   omeprazole 20 MG capsule Commonly known as: PRILOSEC Take 20 mg by mouth daily.   predniSONE 20 MG tablet Commonly known as: DELTASONE Take 3 PO QAM x3days, 2 PO QAM x3days, 1 PO QAM x3days   rOPINIRole 2 MG tablet Commonly known as: REQUIP Take 2 mg by mouth at bedtime.    rosuvastatin 40 MG tablet Commonly known as: CRESTOR Take 1 tablet by mouth every evening.   sitaGLIPtin 100 MG tablet Commonly known as: JANUVIA Take 50 mg by mouth daily.   traMADol 50 MG tablet Commonly known as: ULTRAM Take 50-100 mg by mouth every 6 (six) hours as needed for moderate pain.   traZODone 50 MG tablet Commonly known as: DESYREL Take 150 mg by mouth at bedtime.            Durable Medical Equipment  (From admission, onward)         Start     Ordered   05/21/19 0835  For home use only DME oxygen  Once    Question Answer Comment  Length of Need Lifetime   Mode or (Route) Nasal cannula   Liters per Minute 3   Frequency Continuous (stationary and portable oxygen unit needed)   Oxygen conserving device Yes   Oxygen delivery system Gas      05/21/19 0834   05/20/19 1717  For home use only DME Walker rolling  Once    Question:  Patient needs a walker to treat with the following condition  Answer:  Gait instability   05/20/19 1716         Procedures/Studies: Ct Angio Chest Pe W And/or Wo Contrast  Result Date: 05/19/2019 CLINICAL DATA:  Hypoxemia, shortness of breath with exertion, coughing, elevated D-dimer, question pulmonary embolism; history situs inversus totalis, Kartagener syndrome, hypertension, diabetes mellitus EXAM: CT ANGIOGRAPHY CHEST WITH CONTRAST TECHNIQUE: Multidetector CT imaging of the chest was performed using the standard protocol during bolus administration of intravenous contrast.  Multiplanar CT image reconstructions and MIPs were obtained to evaluate the vascular anatomy. CONTRAST:  173mL OMNIPAQUE IOHEXOL 350 MG/ML SOLN IV COMPARISON:  CT chest 04/22/2018 FINDINGS: Cardiovascular: RIGHT-side aortic arch and dextrocardia. Minimal pericardial fluid. Minimal prominence of cardiac chambers. Aorta normal caliber without aneurysm or dissection. Mild atherosclerotic calcification of aorta and coronary arteries. Pulmonary arteries adequately  opacified and patent. No evidence of pulmonary embolism. Mediastinum/Nodes: Esophagus unremarkable. Base of cervical region normal appearance. Scattered normal size mediastinal lymph nodes. Mildly enlarged AP window node 12 mm short axis image 41, previously 10 mm. No additional thoracic adenopathy. Lungs/Pleura: Scattered interseptal thickening and mosaic attenuation in both lungs which could reflect minimal edema. Chronic marked atelectasis of RIGHT lower lobe. Central peribronchial thickening. No pleural effusion or pneumothorax. Upper Abdomen: Situs inversus.  No upper abdominal abnormalities. Musculoskeletal: Prior cervical spine fusion. No acute osseous findings. Minimal superior endplate compression deformity of L1, age indeterminate in appearance but new since 2019. Review of the MIP images confirms the above findings. IMPRESSION: No evidence of pulmonary embolism. Situs inversus totalis. Bronchitic changes with chronic atelectasis of RIGHT lower lobe. Interseptal thickening and diffuse mosaic attenuation throughout both lungs, could represent edema or less likely infection. Single minimally enlarged para-aortic node 12 mm short axis, nonspecific. Aortic Atherosclerosis (ICD10-I70.0). Electronically Signed   By: Lavonia Dana [name]   On: 05/19/2019 16:55   Portable Chest 1 View  Result Date: 05/20/2019 CLINICAL DATA:  Decreased oxygen saturation over the past 4 days. Cough and shortness of breath with exertion. EXAM: PORTABLE CHEST 1 VIEW COMPARISON:  Single-view of the chest and CT chest 05/19/2019. PA and lateral chest 07/16/2018. FINDINGS: Situs inversus is seen as on the prior studies. Heart size is upper normal. There is pulmonary vascular congestion. No consolidative process, pneumothorax or effusion. Aortic atherosclerosis. No acute or focal bony abnormality. IMPRESSION: Pulmonary vascular congestion. Atherosclerosis. Situs inversus. Electronically Signed   By: Inge Rise [name]   On: 05/20/2019  08:43   Dg Chest Port 1 View  Result Date: 05/19/2019 CLINICAL DATA:  SOB, low O2 saturations EXAM: PORTABLE CHEST 1 VIEW COMPARISON:  Chest radiograph 07/16/2018, 11/19/2017 FINDINGS: Stable cardiomediastinal contours with enlarged heart size. There are diffusely increased bilateral interstitial markings likely representing mild pulmonary edema. Minimal bibasilar linear opacities likely atelectasis. No definite focal infiltrate. No pneumothorax or large pleural effusion. No acute finding in the visualized skeleton. IMPRESSION: Cardiomegaly with diffusely increased bilateral interstitial markings likely mild pulmonary edema, atypical infection not excluded. Electronically Signed   By: Audie Pinto [name]   On: 05/19/2019 11:39     Subjective: Pt says that he is feeling much better, his SOB has resolved and he wants to go home.    Discharge Exam: Vitals:   05/21/19 0604 05/21/19 0722  BP: (!) 137/52   Pulse: 65   Resp: 20   Temp: (!) 97.4 F (36.3 C)   SpO2: 92% 92%   Vitals:   05/20/19 2042 05/21/19 0500 05/21/19 0604 05/21/19 0722  BP: (!) 136/49  (!) 137/52   Pulse: 79  65   Resp: (!) 22  20   Temp: 97.7 F (36.5 C)  (!) 97.4 F (36.3 C)   TempSrc: Oral  Oral   SpO2: 95%  92% 92%  Weight:  100.1 kg    Height:       General exam: sitting up in chair, awake, alert, NAD.  Respiratory system:  BBS CTA.  No increased work of breathing. Cardiovascular system: S1 &  S2 heard. No JVD, murmurs, gallops, clicks or pedal edema. Gastrointestinal system: Abdomen is nondistended, soft and nontender. Normal bowel sounds heard. Central nervous system: Alert and oriented. No focal neurological deficits. Extremities: trace pretibial edema BLE.   The results of significant diagnostics from this hospitalization (including imaging, microbiology, ancillary and laboratory) are listed below for reference.     Microbiology: Recent Results (from the past 240 hour(s))  SARS Coronavirus 2  [facility] order, Performed in [facility] hospital lab) Nasopharyngeal Nasopharyngeal Swab     Status: None   Collection Time: 05/19/19 11:17 AM   Specimen: Nasopharyngeal Swab  Result Value Ref Range Status   SARS Coronavirus 2 NEGATIVE NEGATIVE Final    Comment: (NOTE) If result is NEGATIVE SARS-CoV-2 target nucleic acids are NOT DETECTED. The SARS-CoV-2 RNA is generally detectable in upper and lower  respiratory specimens during the acute phase of infection. The lowest  concentration of SARS-CoV-2 viral copies this assay can detect is 250  copies / mL. A negative result does not preclude SARS-CoV-2 infection  and should not be used as the sole basis for treatment or other  patient management decisions.  A negative result may occur with  improper specimen collection / handling, submission of specimen other  than nasopharyngeal swab, presence of viral mutation(s) within the  areas targeted by this assay, and inadequate number of viral copies  (<250 copies / mL). A negative result must be combined with clinical  observations, patient history, and epidemiological information. If result is POSITIVE SARS-CoV-2 target nucleic acids are DETECTED. The SARS-CoV-2 RNA is generally detectable in upper and lower  respiratory specimens dur ing the acute phase of infection.  Positive  results are indicative of active infection with SARS-CoV-2.  Clinical  correlation with patient history and other diagnostic information is  necessary to determine patient infection status.  Positive results do  not rule out bacterial infection or co-infection with other viruses. If result is PRESUMPTIVE POSTIVE SARS-CoV-2 nucleic acids MAY BE PRESENT.   A presumptive positive result was obtained on the submitted specimen  and confirmed on repeat testing.  While 2019 novel coronavirus  (SARS-CoV-2) nucleic acids may be present in the submitted sample  additional confirmatory testing may be necessary for  epidemiological  and / or clinical management purposes  to differentiate between  SARS-CoV-2 and other Sarbecovirus currently known to infect humans.  If clinically indicated additional testing with an alternate test  methodology [phone]) is advised. The SARS-CoV-2 RNA is generally  detectable in upper and lower respiratory sp ecimens during the acute  phase of infection. The expected result is Negative. Fact Sheet for Patients:  StrictlyIdeas.no Fact Sheet for Healthcare Providers: BankingDealers.co.za This test is not yet approved or cleared by the [place] FDA and has been authorized for detection and/or diagnosis of SARS-CoV-2 by FDA under an Emergency Use Authorization (EUA).  This EUA will remain in effect (meaning this test can be used) for the duration of the COVID-19 declaration under Section 564(b)(1) of the Act, 21 U.S.C. section 360bbb-3(b)(1), unless the authorization is terminated or revoked sooner. Performed at [facility], [address], [place], [place] [address]     Labs: BNP (last 3 results) Recent Labs    07/16/18 1807 05/19/19 1116  BNP 393.0* 0000000*   Basic Metabolic Panel: Recent Labs  Lab 05/19/19 1116 05/20/19 0518  NA 136 137  K 5.1 4.4  CL 97* 95*  CO2 29 32  GLUCOSE 110* 159*  BUN 15 18  CREATININE 1.12 1.01  CALCIUM 9.5 9.4  MG  --  1.9   Liver Function Tests: Recent Labs  Lab 05/19/19 1116  AST 18  ALT 14  ALKPHOS 48  BILITOT 0.3  PROT 7.2  ALBUMIN 3.8   No results for input(s): LIPASE, AMYLASE in the last 168 hours. No results for input(s): AMMONIA in the last 168 hours. CBC: Recent Labs  Lab 05/19/19 1116 05/20/19 0518  WBC 11.0* 12.4*  NEUTROABS 9.0* 10.7*  HGB 9.8* 9.9*  HCT 32.2* 32.0*  MCV 96.4 94.1  PLT 398 396   Cardiac Enzymes: No results for input(s): CKTOTAL, CKMB, CKMBINDEX, TROPONINI in the last 168 hours. BNP: Invalid input(s): POCBNP CBG: Recent  Labs  Lab 05/20/19 1113 05/20/19 1613 05/20/19 2045 05/21/19 0313 05/21/19 0757  GLUCAP 114* 125* 249* 172* 113*   D-Dimer Recent Labs    05/19/19 1116  DDIMER 0.94*   Hgb A1c Recent Labs    05/20/19 0518  HGBA1C 6.4*   Lipid Profile No results for input(s): CHOL, HDL, LDLCALC, TRIG, CHOLHDL, LDLDIRECT in the last 72 hours. Thyroid function studies No results for input(s): TSH, T4TOTAL, T3FREE, THYROIDAB in the last 72 hours.  Invalid input(s): FREET3 Anemia work up No results for input(s): VITAMINB12, FOLATE, FERRITIN, TIBC, IRON, RETICCTPCT in the last 72 hours. Urinalysis    Component Value Date/Time   COLORURINE YELLOW 07/16/2018 1707   APPEARANCEUR CLEAR 07/16/2018 1707   LABSPEC 1.010 07/16/2018 1707   PHURINE 6.0 07/16/2018 1707   GLUCOSEU NEGATIVE 07/16/2018 1707   HGBUR NEGATIVE 07/16/2018 1707   BILIRUBINUR NEGATIVE 07/16/2018 1707   KETONESUR NEGATIVE 07/16/2018 1707   PROTEINUR NEGATIVE 07/16/2018 1707   UROBILINOGEN 0.2 05/13/2015 1440   NITRITE NEGATIVE 07/16/2018 1707   LEUKOCYTESUR NEGATIVE 07/16/2018 1707   Sepsis Labs Invalid input(s): PROCALCITONIN,  WBC,  LACTICIDVEN Microbiology Recent Results (from the past 240 hour(s))  SARS Coronavirus 2 [facility] order, Performed in [facility] hospital lab) Nasopharyngeal Nasopharyngeal Swab     Status: None   Collection Time: 05/19/19 11:17 AM   Specimen: Nasopharyngeal Swab  Result Value Ref Range Status   SARS Coronavirus 2 NEGATIVE NEGATIVE Final    Comment: (NOTE) If result is NEGATIVE SARS-CoV-2 target nucleic acids are NOT DETECTED. The SARS-CoV-2 RNA is generally detectable in upper and lower  respiratory specimens during the acute phase of infection. The lowest  concentration of SARS-CoV-2 viral copies this assay can detect is 250  copies / mL. A negative result does not preclude SARS-CoV-2 infection  and should not be used as the sole basis for treatment or other  patient management  decisions.  A negative result may occur with  improper specimen collection / handling, submission of specimen other  than nasopharyngeal swab, presence of viral mutation(s) within the  areas targeted by this assay, and inadequate number of viral copies  (<250 copies / mL). A negative result must be combined with clinical  observations, patient history, and epidemiological information. If result is POSITIVE SARS-CoV-2 target nucleic acids are DETECTED. The SARS-CoV-2 RNA is generally detectable in upper and lower  respiratory specimens dur ing the acute phase of infection.  Positive  results are indicative of active infection with SARS-CoV-2.  Clinical  correlation with patient history and other diagnostic information is  necessary to determine patient infection status.  Positive results do  not rule out bacterial infection or co-infection with other viruses. If result is PRESUMPTIVE POSTIVE SARS-CoV-2 nucleic acids MAY BE PRESENT.   A presumptive  positive result was obtained on the submitted specimen  and confirmed on repeat testing.  While 2019 novel coronavirus  (SARS-CoV-2) nucleic acids may be present in the submitted sample  additional confirmatory testing may be necessary for epidemiological  and / or clinical management purposes  to differentiate between  SARS-CoV-2 and other Sarbecovirus currently known to infect humans.  If clinically indicated additional testing with an alternate test  methodology [phone]) is advised. The SARS-CoV-2 RNA is generally  detectable in upper and lower respiratory sp ecimens during the acute  phase of infection. The expected result is Negative. Fact Sheet for Patients:  StrictlyIdeas.no Fact Sheet for Healthcare Providers: BankingDealers.co.za This test is not yet approved or cleared by the [place] FDA and has been authorized for detection and/or diagnosis of SARS-CoV-2 by FDA under an  Emergency Use Authorization (EUA).  This EUA will remain in effect (meaning this test can be used) for the duration of the COVID-19 declaration under Section 564(b)(1) of the Act, 21 U.S.C. section 360bbb-3(b)(1), unless the authorization is terminated or revoked sooner. Performed at [facility], [address], [place], [place] [address]    Time coordinating discharge: 32 minutes   SIGNED:  Irwin Brakeman, MD  [facility] 05/21/2019, 8:46 AM How to contact the [facility] Attending or Consulting provider [place] or covering provider during after hours [place], for this patient?  1. Check the care team in [facility] and look for a) attending/consulting TRH provider listed and b) the [facility] team listed 2. Log into [email or web address] and use [place]'s universal password to access. If you do not have the password, please contact the hospital operator. 3. Locate the [facility] provider you are looking for under [facility] and page to a number that you can be directly reached. 4. If you still have difficulty reaching the provider, please page the [facility] (Director on Call) for the Hospitalists listed on amion for assistance.

## 2019-05-21 NOTE — TOC Transition Note (Signed)
Transition of Care [facility]) - CM/SW Discharge Note   Patient Details  Name: LILIANA BIBY MRN: [id number] Date of Birth: 04-12-1951  Transition of Care [facility]) CM/SW Contact:  Vinette Crites, Chauncey [place], RN Phone Number: 05/21/2019, 12:35 PM   Clinical Narrative:   Patient qualifies for home oxygen. Reports he has had it with [employer] but needs it with another company. Will send referral to [place]. Patient declines home health PT or OP PT . States he doesn't feel he needs it. Has cane at home, doesn't want RW.  Wife will transport home today. Patient in a hurry and doesn't want to wait on oxygen. Encouraged patient to wait.     Final next level of care: Home/Self Care Barriers to Discharge: No Barriers Identified   Patient Goals and CMS Choice Patient states their goals for this hospitalization and ongoing recovery are:: wants to leave ASAP for home, refusing [facility] CMS Medicare.gov Compare Post Acute Care list provided to:: Patient Choice offered to / list presented to : Patient     Discharge Plan and Services   Discharge Planning Services: CM Consult Post Acute Care Choice: Home Health            DME Agency: [employer] Date DME Agency Contacted: 05/21/19 Time DME Agency Contacted: 1234 Representative spoke with at DME Agency: [place] ([place]) Interventions     Readmission Risk Interventions No flowsheet data found.

## 2019-05-21 NOTE — Progress Notes (Signed)
SATURATION QUALIFICATIONS: (This note is used to comply with regulatory documentation for home oxygen)  Patient Saturations on Room Air at Rest = 96%  Patient Saturations on Room Air while Ambulating =  88%  Patient Saturations on 2 Liters of oxygen while Ambulating = 92%  Please briefly explain why patient needs home oxygen: Patient with increased SOB and decreased oxygen saturation with activity.

## 2019-05-21 NOTE — Discharge Instructions (Signed)
IMPORTANT INFORMATION: PAY CLOSE ATTENTION   PHYSICIAN DISCHARGE INSTRUCTIONS  Follow with Primary care provider  Kim, Trevante, MD  and other consultants as instructed by your Hospitalist Physician  SEEK MEDICAL CARE OR RETURN TO EMERGENCY ROOM IF SYMPTOMS COME BACK, WORSEN OR NEW PROBLEM DEVELOPS   Please note: You were cared for by a hospitalist during your hospital stay. Every effort will be made to forward records to your primary care provider.  You can request that your primary care provider send for your hospital records if they have not received them.  Once you are discharged, your primary care physician will handle any further medical issues. Please note that NO REFILLS for any discharge medications will be authorized once you are discharged, as it is imperative that you return to your primary care physician (or establish a relationship with a primary care physician if you do not have one) for your post hospital discharge needs so that they can reassess your need for medications and monitor your lab values.  Please get a complete blood count and chemistry panel checked by your Primary MD at your next visit, and again as instructed by your Primary MD.  Get Medicines reviewed and adjusted: Please take all your medications with you for your next visit with your Primary MD  Laboratory/radiological data: Please request your Primary MD to go over all hospital tests and procedure/radiological results at the follow up, please ask your primary care provider to get all Hospital records sent to his/her office.  In some cases, they will be blood work, cultures and biopsy results pending at the time of your discharge. Please request that your primary care provider follow up on these results.  If you are diabetic, please bring your blood sugar readings with you to your follow up appointment with primary care.    Please call and make your follow up appointments as soon as possible.    Also Note the  following: If you experience worsening of your admission symptoms, develop shortness of breath, life threatening emergency, suicidal or homicidal thoughts you must seek medical attention immediately by calling 911 or calling your MD immediately  if symptoms less severe.  You must read complete instructions/literature along with all the possible adverse reactions/side effects for all the Medicines you take and that have been prescribed to you. Take any new Medicines after you have completely understood and accpet all the possible adverse reactions/side effects.   Do not drive when taking Pain medications or sleeping medications (Benzodiazepines)  Do not take more than prescribed Pain, Sleep and Anxiety Medications. It is not advisable to combine anxiety,sleep and pain medications without talking with your primary care practitioner  Special Instructions: If you have smoked or chewed Tobacco  in the last 2 yrs please stop smoking, stop any regular Alcohol  and or any Recreational drug use.  Wear Seat belts while driving.  Do not drive if taking any narcotic, mind altering or controlled substances or recreational drugs or alcohol.       

## 2019-05-21 NOTE — Plan of Care (Signed)

## 2019-05-21 NOTE — Progress Notes (Signed)
Pt is upset that his oxygen has not been delivered yet for discharge. Explained that DME will be delivering and we do not have any control over that. States understanding. Wife at bedside.

## 2019-05-21 NOTE — Care Management (Addendum)
Results ListSupplier Information [place]   [place], [place] [address] [phone]    [place]  [place], [place] [address] [phone]  Map and Directions for [place] in a new window 10.10  [place], [place] [address] [phone]  Map and Directions for [place] in a new window 10.10  [place]   [place]  [place], [place] [address] [phone]  Map and Directions for [place] in a new window 10.92  [place]   [address] [place]  [place], [place] [address] [phone]  View More Locations for [place] in a new window  Map and Directions for [place] in a new window [place]   [place], [place] [address] [phone]  View More Locations for [place] in a new window  Map and Directions for [place] in a new window 26.02  [place] [address] [phone]  Map and Directions for [place] in a new window 27.88  [place] [address] [phone]  Map and Directions for [place] in a new window 28.30  [place] [address] [phone]  Map and Directions for [place] in a new window [address]   [place], [place] [address] [phone]  [place]

## 2019-07-05 ENCOUNTER — Emergency Department (HOSPITAL_COMMUNITY): Admission: EM | Admit: 2019-07-05 | Discharge: 2019-07-05 | Discharge status: Absent without leave | Payer: Medicare HMO

## 2019-07-05 ENCOUNTER — Telehealth: Payer: Self-pay | Admitting: Cardiology

## 2019-07-05 ENCOUNTER — Other Ambulatory Visit: Payer: Self-pay

## 2019-07-05 NOTE — Telephone Encounter (Signed)
Agree with er evalution, if found to be fluid overloaded they may adjust his diuretic   Zandra Abts MD

## 2019-07-05 NOTE — Telephone Encounter (Signed)
Please give pt's wife a call-- pt has had a 6lb weight gain over night   [phone]

## 2019-07-05 NOTE — Telephone Encounter (Signed)
Wife states he was 223 lbs  yesterday, now 229 lbs- up 6 lbs, takes lasix 60 mg am and 40 mg pm per his lasix bottle instructions. She said he is coughing up a lot of mucus and has been dizzy and has fallen twice this last week.She then tells me she is going to take him to the ED for evaluation.

## 2019-07-05 NOTE — Telephone Encounter (Signed)
Please give pt's wife a call-- pt has had a 6lb weight gain over night   [phone]

## 2019-07-08 ENCOUNTER — Encounter (HOSPITAL_COMMUNITY): Payer: Self-pay

## 2019-07-08 ENCOUNTER — Other Ambulatory Visit: Payer: Self-pay

## 2019-07-08 ENCOUNTER — Emergency Department (HOSPITAL_COMMUNITY): Payer: Medicare HMO

## 2019-07-08 ENCOUNTER — Inpatient Hospital Stay (HOSPITAL_COMMUNITY)
Admission: EM | Admit: 2019-07-08 | Discharge: 2019-07-11 | DRG: 190 | Disposition: A | Discharge status: Home / Self Care | Payer: Medicare HMO | Attending: Internal Medicine | Admitting: Internal Medicine

## 2019-07-08 DIAGNOSIS — J441 Chronic obstructive pulmonary disease with (acute) exacerbation: Secondary | ICD-10-CM

## 2019-07-08 DIAGNOSIS — Z6832 Body mass index (BMI) 32.0-32.9, adult: Secondary | ICD-10-CM

## 2019-07-08 DIAGNOSIS — Z8711 Personal history of peptic ulcer disease: Secondary | ICD-10-CM

## 2019-07-08 DIAGNOSIS — Z8719 Personal history of other diseases of the digestive system: Secondary | ICD-10-CM

## 2019-07-08 DIAGNOSIS — R0902 Hypoxemia: Secondary | ICD-10-CM | POA: Diagnosis not present

## 2019-07-08 DIAGNOSIS — Z9842 Cataract extraction status, left eye: Secondary | ICD-10-CM

## 2019-07-08 DIAGNOSIS — F329 Major depressive disorder, single episode, unspecified: Secondary | ICD-10-CM | POA: Diagnosis present

## 2019-07-08 DIAGNOSIS — E1140 Type 2 diabetes mellitus with diabetic neuropathy, unspecified: Secondary | ICD-10-CM | POA: Diagnosis present

## 2019-07-08 DIAGNOSIS — Z794 Long term (current) use of insulin: Secondary | ICD-10-CM

## 2019-07-08 DIAGNOSIS — J9601 Acute respiratory failure with hypoxia: Secondary | ICD-10-CM | POA: Diagnosis present

## 2019-07-08 DIAGNOSIS — Z20828 Contact with and (suspected) exposure to other viral communicable diseases: Secondary | ICD-10-CM | POA: Diagnosis present

## 2019-07-08 DIAGNOSIS — K5900 Constipation, unspecified: Secondary | ICD-10-CM | POA: Diagnosis present

## 2019-07-08 DIAGNOSIS — J9621 Acute and chronic respiratory failure with hypoxia: Secondary | ICD-10-CM | POA: Diagnosis present

## 2019-07-08 DIAGNOSIS — I110 Hypertensive heart disease with heart failure: Secondary | ICD-10-CM | POA: Diagnosis present

## 2019-07-08 DIAGNOSIS — R195 Other fecal abnormalities: Secondary | ICD-10-CM | POA: Diagnosis present

## 2019-07-08 DIAGNOSIS — Z79899 Other long term (current) drug therapy: Secondary | ICD-10-CM

## 2019-07-08 DIAGNOSIS — D509 Iron deficiency anemia, unspecified: Secondary | ICD-10-CM | POA: Diagnosis present

## 2019-07-08 DIAGNOSIS — Z8249 Family history of ischemic heart disease and other diseases of the circulatory system: Secondary | ICD-10-CM

## 2019-07-08 DIAGNOSIS — E6609 Other obesity due to excess calories: Secondary | ICD-10-CM | POA: Diagnosis present

## 2019-07-08 DIAGNOSIS — Z833 Family history of diabetes mellitus: Secondary | ICD-10-CM

## 2019-07-08 DIAGNOSIS — Z9841 Cataract extraction status, right eye: Secondary | ICD-10-CM

## 2019-07-08 DIAGNOSIS — Z7982 Long term (current) use of aspirin: Secondary | ICD-10-CM

## 2019-07-08 DIAGNOSIS — Z23 Encounter for immunization: Secondary | ICD-10-CM

## 2019-07-08 DIAGNOSIS — J9620 Acute and chronic respiratory failure, unspecified whether with hypoxia or hypercapnia: Secondary | ICD-10-CM | POA: Diagnosis present

## 2019-07-08 DIAGNOSIS — Z720 Tobacco use: Secondary | ICD-10-CM

## 2019-07-08 DIAGNOSIS — Q893 Situs inversus: Secondary | ICD-10-CM

## 2019-07-08 DIAGNOSIS — Z8261 Family history of arthritis: Secondary | ICD-10-CM

## 2019-07-08 DIAGNOSIS — E66811 Obesity, class 1: Secondary | ICD-10-CM

## 2019-07-08 DIAGNOSIS — Z7951 Long term (current) use of inhaled steroids: Secondary | ICD-10-CM

## 2019-07-08 DIAGNOSIS — Z841 Family history of disorders of kidney and ureter: Secondary | ICD-10-CM

## 2019-07-08 DIAGNOSIS — Z825 Family history of asthma and other chronic lower respiratory diseases: Secondary | ICD-10-CM

## 2019-07-08 DIAGNOSIS — I5032 Chronic diastolic (congestive) heart failure: Secondary | ICD-10-CM | POA: Diagnosis present

## 2019-07-08 DIAGNOSIS — D649 Anemia, unspecified: Secondary | ICD-10-CM

## 2019-07-08 LAB — GLUCOSE, CAPILLARY
Glucose-Capillary: 85 mg/dL (ref 70–99)
Glucose-Capillary: 195 mg/dL — ABNORMAL HIGH (ref 70–99)

## 2019-07-08 LAB — CBC WITH DIFFERENTIAL/PLATELET
Abs Immature Granulocytes: 0.04 10*3/uL (ref 0.00–0.07)
Basophils Absolute: 0 10*3/uL (ref 0.0–0.1)
Basophils Relative: 0 %
Eosinophils Absolute: 0.1 10*3/uL (ref 0.0–0.5)
Eosinophils Relative: 1 %
HCT: 27.2 % — ABNORMAL LOW (ref 39.0–52.0)
Hemoglobin: 8.3 g/dL — ABNORMAL LOW (ref 13.0–17.0)
Immature Granulocytes: 0 %
Lymphocytes Relative: 15 %
Lymphs Abs: 1.6 10*3/uL (ref 0.7–4.0)
MCH: 29 pg (ref 26.0–34.0)
MCHC: 30.5 g/dL (ref 30.0–36.0)
MCV: 95.1 fL (ref 80.0–100.0)
Monocytes Absolute: 0.8 10*3/uL (ref 0.1–1.0)
Monocytes Relative: 7 %
Neutro Abs: 7.9 10*3/uL — ABNORMAL HIGH (ref 1.7–7.7)
Neutrophils Relative %: 77 %
Platelets: 375 10*3/uL (ref 150–400)
RBC: 2.86 MIL/uL — ABNORMAL LOW (ref 4.22–5.81)
RDW: 14.5 % (ref 11.5–15.5)
WBC: 10.4 10*3/uL (ref 4.0–10.5)
nRBC: 0 % (ref 0.0–0.2)

## 2019-07-08 LAB — BASIC METABOLIC PANEL
Anion gap: 12 (ref 5–15)
BUN: 16 mg/dL (ref 8–23)
CO2: 28 mmol/L (ref 22–32)
Calcium: 9.1 mg/dL (ref 8.9–10.3)
Chloride: 95 mmol/L — ABNORMAL LOW (ref 98–111)
Creatinine, Ser: 0.8 mg/dL (ref 0.61–1.24)
GFR calc Af Amer: >60 mL/min (ref >60)
GFR calc non Af Amer: >60 mL/min (ref >60)
Glucose, Bld: 78 mg/dL (ref 70–99)
Potassium: 4.7 mmol/L (ref 3.5–5.1)
Sodium: 135 mmol/L (ref 135–145)

## 2019-07-08 LAB — D-DIMER, QUANTITATIVE: D-Dimer, Quant: 0.44 ug/mL-FEU (ref 0.00–0.50)

## 2019-07-08 LAB — BRAIN NATRIURETIC PEPTIDE: B Natriuretic Peptide: 394 pg/mL — ABNORMAL HIGH (ref 0.0–100.0)

## 2019-07-08 MED ORDER — ACETAMINOPHEN 650 MG RE SUPP: 650.0000 mg | Freq: Four times a day (QID) | RECTAL | Status: DC | PRN

## 2019-07-08 MED ORDER — ALBUTEROL SULFATE HFA 108 (90 BASE) MCG/ACT IN AERS
2.0000 | INHALATION_SPRAY | RESPIRATORY_TRACT | Status: DC | PRN
  Administered 2019-07-08: 19:00:00 2 via RESPIRATORY_TRACT

## 2019-07-08 MED ORDER — ACETAMINOPHEN 325 MG PO TABS
650.0000 mg | ORAL_TABLET | Freq: Four times a day (QID) | ORAL | Status: DC | PRN
  Administered 2019-07-09: 22:00:00 650 mg via ORAL
  Filled 2019-07-08: qty 2

## 2019-07-08 MED ORDER — ONDANSETRON HCL 4 MG/2ML IJ SOLN: 4.0000 mg | Freq: Four times a day (QID) | INTRAMUSCULAR | Status: DC | PRN

## 2019-07-08 MED ORDER — METOPROLOL TARTRATE 25 MG PO TABS
25.0000 mg | ORAL_TABLET | Freq: Two times a day (BID) | ORAL | Status: DC
  Administered 2019-07-09 – 2019-07-11 (×6): 25 mg via ORAL
  Filled 2019-07-08 (×6): qty 1

## 2019-07-08 MED ORDER — LOSARTAN POTASSIUM 50 MG PO TABS
100.0000 mg | ORAL_TABLET | Freq: Every day | ORAL | Status: DC
  Administered 2019-07-09 – 2019-07-11 (×3): 100 mg via ORAL
  Filled 2019-07-08 (×3): qty 2

## 2019-07-08 MED ORDER — INSULIN ASPART 100 UNIT/ML ~~LOC~~ SOLN
0.0000 [IU] | Freq: Three times a day (TID) | SUBCUTANEOUS | Status: DC
  Administered 2019-07-09: 13:00:00 3 [IU] via SUBCUTANEOUS
  Administered 2019-07-09: 5 [IU] via SUBCUTANEOUS
  Administered 2019-07-10 – 2019-07-11 (×4): 3 [IU] via SUBCUTANEOUS

## 2019-07-08 MED ORDER — FUROSEMIDE 40 MG PO TABS: 40.0000 mg | ORAL_TABLET | ORAL | Status: DC

## 2019-07-08 MED ORDER — GUAIFENESIN-DM 100-10 MG/5ML PO SYRP
15.0000 mL | ORAL_SOLUTION | Freq: Three times a day (TID) | ORAL | Status: AC
  Administered 2019-07-09 – 2019-07-10 (×5): 15 mL via ORAL
  Filled 2019-07-08 (×5): qty 15

## 2019-07-08 MED ORDER — TRAZODONE HCL 50 MG PO TABS
150.0000 mg | ORAL_TABLET | Freq: Every day | ORAL | Status: DC
  Administered 2019-07-09 – 2019-07-10 (×3): 150 mg via ORAL
  Filled 2019-07-08 (×3): qty 3

## 2019-07-08 MED ORDER — FENOFIBRATE 54 MG PO TABS
54.0000 mg | ORAL_TABLET | Freq: Every day | ORAL | Status: DC
  Administered 2019-07-09 – 2019-07-11 (×3): 54 mg via ORAL
  Filled 2019-07-08 (×4): qty 1

## 2019-07-08 MED ORDER — ONDANSETRON HCL 4 MG PO TABS: 4.0000 mg | ORAL_TABLET | Freq: Four times a day (QID) | ORAL | Status: DC | PRN

## 2019-07-08 MED ORDER — GABAPENTIN 400 MG PO CAPS
1200.0000 mg | ORAL_CAPSULE | Freq: Three times a day (TID) | ORAL | Status: DC
  Administered 2019-07-09 – 2019-07-11 (×8): 1200 mg via ORAL
  Filled 2019-07-08 (×8): qty 3

## 2019-07-08 MED ORDER — INSULIN DETEMIR 100 UNIT/ML ~~LOC~~ SOLN
20.0000 [IU] | Freq: Every day | SUBCUTANEOUS | Status: DC
  Administered 2019-07-09 – 2019-07-10 (×3): 20 [IU] via SUBCUTANEOUS
  Filled 2019-07-08 (×4): qty 0.2

## 2019-07-08 MED ORDER — ALBUTEROL SULFATE (2.5 MG/3ML) 0.083% IN NEBU: 5.0000 mg | INHALATION_SOLUTION | Freq: Once | RESPIRATORY_TRACT | Status: DC

## 2019-07-08 MED ORDER — IPRATROPIUM-ALBUTEROL 0.5-2.5 (3) MG/3ML IN SOLN: 3.0000 mL | RESPIRATORY_TRACT | Status: DC | PRN

## 2019-07-08 MED ORDER — INFLUENZA VAC A&B SA ADJ QUAD 0.5 ML IM PRSY
0.5000 mL | PREFILLED_SYRINGE | INTRAMUSCULAR | Status: AC
  Administered 2019-07-09: 11:00:00 0.5 mL via INTRAMUSCULAR
  Filled 2019-07-08: qty 0.5

## 2019-07-08 MED ORDER — ROSUVASTATIN CALCIUM 20 MG PO TABS
40.0000 mg | ORAL_TABLET | Freq: Every evening | ORAL | Status: DC
  Administered 2019-07-09 – 2019-07-10 (×2): 40 mg via ORAL
  Filled 2019-07-08 (×2): qty 2

## 2019-07-08 MED ORDER — METHYLPREDNISOLONE SODIUM SUCC 125 MG IJ SOLR
60.0000 mg | Freq: Two times a day (BID) | INTRAMUSCULAR | Status: DC
  Administered 2019-07-09 – 2019-07-11 (×5): 60 mg via INTRAVENOUS
  Filled 2019-07-08 (×5): qty 2

## 2019-07-08 MED ORDER — SODIUM CHLORIDE 0.9 % IV SOLN
500.0000 mg | INTRAVENOUS | Status: DC
  Administered 2019-07-09 (×2): 500 mg via INTRAVENOUS
  Filled 2019-07-08 (×2): qty 500

## 2019-07-08 MED ORDER — PREDNISONE 50 MG PO TABS
60.0000 mg | ORAL_TABLET | Freq: Once | ORAL | Status: AC
  Administered 2019-07-08: 60 mg via ORAL
  Filled 2019-07-08: qty 1

## 2019-07-08 MED ORDER — ALBUTEROL SULFATE (2.5 MG/3ML) 0.083% IN NEBU: 2.5000 mg | INHALATION_SOLUTION | RESPIRATORY_TRACT | Status: DC | PRN

## 2019-07-08 MED ORDER — ALBUTEROL SULFATE HFA 108 (90 BASE) MCG/ACT IN AERS: 1.0000 | INHALATION_SPRAY | RESPIRATORY_TRACT | Status: DC | PRN

## 2019-07-08 MED ORDER — PANTOPRAZOLE SODIUM 40 MG IV SOLR
40.0000 mg | INTRAVENOUS | Status: DC
  Administered 2019-07-09 – 2019-07-10 (×3): 40 mg via INTRAVENOUS
  Filled 2019-07-08 (×3): qty 40

## 2019-07-08 MED ORDER — INSULIN ASPART 100 UNIT/ML ~~LOC~~ SOLN: 0.0000 [IU] | Freq: Every day | SUBCUTANEOUS | Status: DC

## 2019-07-08 MED ORDER — POLYETHYLENE GLYCOL 3350 17 G PO PACK: 17.0000 g | PACK | Freq: Every day | ORAL | Status: DC | PRN

## 2019-07-08 MED ORDER — DULOXETINE HCL 60 MG PO CPEP
60.0000 mg | ORAL_CAPSULE | Freq: Every day | ORAL | Status: DC
  Administered 2019-07-09 – 2019-07-11 (×3): 60 mg via ORAL
  Filled 2019-07-08 (×3): qty 1

## 2019-07-08 MED ORDER — IPRATROPIUM-ALBUTEROL 0.5-2.5 (3) MG/3ML IN SOLN
3.0000 mL | Freq: Four times a day (QID) | RESPIRATORY_TRACT | Status: DC
  Administered 2019-07-09 – 2019-07-10 (×5): 3 mL via RESPIRATORY_TRACT
  Filled 2019-07-08 (×5): qty 3

## 2019-07-08 MED ORDER — ALBUTEROL SULFATE HFA 108 (90 BASE) MCG/ACT IN AERS
1.0000 | INHALATION_SPRAY | Freq: Once | RESPIRATORY_TRACT | Status: AC
  Administered 2019-07-08: 18:00:00 2 via RESPIRATORY_TRACT
  Filled 2019-07-08: qty 6.7

## 2019-07-08 MED ORDER — ROPINIROLE HCL 1 MG PO TABS
2.0000 mg | ORAL_TABLET | Freq: Every day | ORAL | Status: DC
  Administered 2019-07-09 – 2019-07-10 (×2): 2 mg via ORAL
  Filled 2019-07-08 (×2): qty 2

## 2019-07-08 MED ORDER — MOMETASONE FURO-FORMOTEROL FUM 200-5 MCG/ACT IN AERO
2.0000 | INHALATION_SPRAY | Freq: Two times a day (BID) | RESPIRATORY_TRACT | Status: DC
  Administered 2019-07-09 – 2019-07-10 (×4): 2 via RESPIRATORY_TRACT
  Filled 2019-07-08: qty 8.8

## 2019-07-08 NOTE — ED Provider Notes (Signed)
[facility] EMERGENCY DEPARTMENT Provider Note   CSN: [id number] Arrival date & time: 07/08/19  1256     History   Chief Complaint Chief Complaint  Patient presents with   hypoxia    HPI Brett [name] is a 68 y.o. male past medical history significant for Kartagener syndrome, diabetes, hypertension, COPD, diastolic CHF, iron deficiency anemia presents to emergency department today with chief complaint of shortness of breath and hypoxia x1 week.  Patient states he wears oxygen as needed at home when needed.  He states over the last week he has had to wear oxygen more often and for the last 6 days he has been wearing it 24/7.  When needed he wears 3 L, he states he has had to increase to 4 L over the last several days.  He states he has shortness of breath with minimal exertion, only being able to walk several feet before he has to stop and catch his breath.  He admits to associated cough that is productive with clear sputum.  He takes Mucinex daily for his chronic cough.  No other medications tried for symptoms prior to arrival.  He denies any chest pain, fever, chills, abdominal pain, nausea, vomiting, lower extremity edema, no blood per rectum, no bloody diarrhea.   Chart review shows pt was admitted to hospital on 05/19/19-05/21/19 for COPD exacerbation. He was discharged home with prn O2 nasal cannula.  History provided by patient with additional history obtained from chart review.      Past Medical History:  Diagnosis Date   Depression    Dextrocardia    Diabetes mellitus    H. 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   Chronic respiratory failure with hypoxia ([place]) 07/16/2018   HTN (hypertension) 07/16/2018   Acute on chronic respiratory failure with hypoxia ([place]) 04/21/2018   COPD (chronic obstructive pulmonary disease) ([place]) 04/21/2018   SVT (supraventricular tachycardia) ([place]) 04/14/2018   Paroxysmal SVT (supraventricular tachycardia) ([place]) 04/14/2018   Chronic diastolic CHF (congestive heart failure) ([place]) 04/14/2018   CHF exacerbation ([place]) 04/10/2018   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 hpylori on bx- treated with pylera. TCS- normal rectum. 1 diminutive polyp in the mid  descending segment. 1-81mm polyp in the mid desending segment o/w the remainder of the colonic mucosa appeared normal. tubular adenoma on bx   GIVENS CAPSULE STUDY N/A 10/07/2013   no source for anemia or heme positive stool noted   NECK SURGERY     bone spurs        Home Medications     Prior to Admission medications   Medication Sig Start Date End Date Taking? Authorizing Provider  budesonide-formoterol (SYMBICORT) 160-4.5 MCG/ACT inhaler Inhale 2 puffs into the lungs 2 (two) times daily. 05/21/19  Yes Johnson, Clanford L, MD  gabapentin (NEURONTIN) 600 MG tablet Take 1,200 mg by mouth 3 (three) times daily.   Yes [provider]  sitaGLIPtin (JANUVIA) 100 MG tablet Take 100 mg by mouth daily.    Yes [provider]  traMADol (ULTRAM) 50 MG tablet Take 50-100 mg by mouth every 6 (six) hours as needed for moderate pain.  07/25/18  Yes [provider]  traZODone (DESYREL) 50 MG tablet Take 150 mg by mouth at bedtime.  07/03/18  Yes [provider]  albuterol (PROAIR HFA) 108 (90 Base) MCG/ACT inhaler Inhale 2 puffs into the lungs every 4 (four) hours as needed for wheezing or shortness of breath. 09/27/16   Valentina Shaggy, MD  aspirin EC 81 MG tablet Take 81 mg by mouth daily.    [provider]  CINNAMON PO Take 2,000 tablets by mouth daily.    [provider]  DULoxetine (CYMBALTA) 60 MG capsule Take 60 mg by mouth daily.    [provider]  fenofibrate (TRICOR) 145 MG tablet Take 145 mg by mouth every evening.  05/28/18   [provider]  ferrous sulfate 325 (65 FE) MG tablet Take 325-650 mg by mouth daily with breakfast. Take two tablets on Monday Wednesday, Friday. And 1 tablet on all other days    [provider]  folic acid (FOLVITE) 1 MG tablet Take 1 mg by mouth daily.    [provider]  furosemide (LASIX) 40 MG tablet Take 1 tablet (40 mg total) by mouth 2 (two) times daily. 05/21/19   Johnson, Clanford L, MD  GRALISE 600 MG TABS Take 600 mg by mouth daily with supper.  11/23/18   [provider]  insulin detemir (LEVEMIR) 100 UNIT/ML injection Inject 30 Units into the skin at bedtime. 30 units in the morning and 60 units at bedtime    [provider]  losartan  (COZAAR) 100 MG tablet Take 100 mg by mouth daily.    [provider]  metFORMIN (GLUCOPHAGE) 1000 MG tablet Take 1,000 mg by mouth 2 (two) times daily with a meal.    [provider]  metoprolol tartrate (LOPRESSOR) 25 MG tablet Take 1 tablet (25 mg total) by mouth 2 (two) times daily. 03/28/19   Arnoldo Lenis, MD  omeprazole (PRILOSEC) 20 MG capsule Take 20 mg by mouth daily.    [provider]  predniSONE (DELTASONE) 20 MG tablet Take 3 PO QAM x3days, 2 PO QAM x3days, 1 PO QAM x3days 05/21/19   Johnson, Clanford L, MD  rOPINIRole (REQUIP) 2 MG tablet Take 2 mg by mouth at bedtime.     [provider]  rosuvastatin (CRESTOR) 40 MG tablet Take 1 tablet by mouth every evening. 03/22/18   [provider]    Family History Family History  Problem Relation Age of Onset   Heart attack Mother    Diabetes Sister  Fx   Heart defect Sister        Fx   Arthritis Sister        Fx   Asthma Sister        Fx   Kidney disease Sister        Fx   Pancreatitis Sister     Social History Social History   Tobacco Use   Smoking status: Never Smoker   Smokeless tobacco: Current User    Types: Chew  Substance Use Topics   Alcohol use: No   Drug use: No     Allergies   Patient has no known allergies.   Review of Systems Review of Systems  Constitutional: Negative for chills and fever.  HENT: Negative for congestion, rhinorrhea, sinus pressure and sore throat.   Eyes: Negative for pain and redness.  Respiratory: Positive for cough and shortness of breath. Negative for choking and wheezing.   Cardiovascular: Negative for chest pain, palpitations and leg swelling.  Gastrointestinal: Negative for abdominal pain, constipation, diarrhea, nausea and vomiting.  Genitourinary: Negative for dysuria.  Musculoskeletal: Negative for arthralgias, back pain, myalgias and neck pain.  Skin: Negative for rash and wound.  Neurological:  Negative for dizziness, syncope, weakness, numbness and headaches.  Psychiatric/Behavioral: Negative for confusion.     Physical Exam Updated Vital Signs BP (!) 119/56 (BP Location: Left Arm)    Pulse (!) 59    Temp (!) 97.5 F (36.4 C) (Oral)    Resp 18    Wt 105.2 kg    SpO2 96%    BMI 36.34 kg/m   Physical Exam Vitals signs and nursing note reviewed.  Constitutional:      General: He is not in acute distress.    Appearance: He is obese. He is not ill-appearing.  HENT:     Head: Normocephalic and atraumatic.     Right Ear: Tympanic membrane and external ear normal.     Left Ear: Tympanic membrane and external ear normal.     Nose: Nose normal.     Mouth/Throat:     Mouth: Mucous membranes are moist.     Pharynx: Oropharynx is clear.  Eyes:     General: No scleral icterus.       Right eye: No discharge.        Left eye: No discharge.     Extraocular Movements: Extraocular movements intact.     Conjunctiva/sclera: Conjunctivae normal.     Pupils: Pupils are equal, round, and reactive to light.  Neck:     Musculoskeletal: Normal range of motion.     Vascular: No JVD.  Cardiovascular:     Rate and Rhythm: Normal rate and regular rhythm.     Pulses: Normal pulses.          Radial pulses are 2+ on the right side and 2+ on the left side.     Heart sounds: Normal heart sounds.  Pulmonary:     Comments: Lung sounds diminished throughout.. Symmetric chest rise. No wheezing, rales, or rhonchi. SpO2 is 96% on 3L. Pt is speaking in short sentences with accessory muscle use. Abdominal:     Comments: Abdomen is soft, non-distended, and non-tender in all quadrants. No rigidity, no guarding. No peritoneal signs.  Musculoskeletal: Normal range of motion.  Skin:    General: Skin is warm and dry.     Capillary Refill: Capillary refill takes less than 2 seconds.  Neurological:     Mental Status: He is oriented to  person, place, and time.     GCS: GCS eye subscore is 4. GCS verbal  subscore is 5. GCS motor subscore is 6.     Comments: Fluent speech, no facial droop.  Psychiatric:        Behavior: Behavior normal.      ED Treatments / Results  Labs (all labs ordered are listed, but only abnormal results are displayed) Labs Reviewed  BASIC METABOLIC PANEL - Abnormal; Notable for the following components:      Result Value   Chloride 95 (*)    All other components within normal limits  CBC WITH DIFFERENTIAL/PLATELET - Abnormal; Notable for the following components:   RBC 2.86 (*)    Hemoglobin 8.3 (*)    HCT 27.2 (*)    Neutro Abs 7.9 (*)    All other components within normal limits  BRAIN NATRIURETIC PEPTIDE - Abnormal; Notable for the following components:   B Natriuretic Peptide 394.0 (*)    All other components within normal limits  SARS CORONAVIRUS 2 (TAT 6-24 HRS)  GLUCOSE, CAPILLARY  D-DIMER, QUANTITATIVE (NOT AT [facility])    EKG EKG Interpretation  Date/Time:  Monday July 08 2019 14:36:31 EDT Ventricular Rate:  59 PR Interval:    QRS Duration: 86 QT Interval:  390 QTC Calculation: 387 R Axis:   100 Text Interpretation: Sinus or ectopic atrial rhythm Right axis deviation Low voltage, precordial leads Abnormal lateral Q waves Probable anteroseptal infarct, old no stemi Confirmed by Nanda Quinton [phone]) on 07/08/2019 4:36:35 PM   Radiology Dg Chest 2 View  Result Date: 07/08/2019 CLINICAL DATA:  Shortness of breath. EXAM: CHEST - 2 VIEW COMPARISON:  May 20, 2019. 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 Stable cardiomediastinal silhouette in terms of size. No pneumothorax or pleural effusion is noted. No acute pulmonary disease is noted. Bony thorax is unremarkable. IMPRESSION: No active cardiopulmonary disease. Electronically Signed   By: Marijo Conception [name]   On: 07/08/2019 13:50    Procedures Procedures (including critical care time)  Medications Ordered in ED Medications  predniSONE (DELTASONE) tablet 60 mg (60 mg Oral Given  07/08/19 1550)  albuterol (VENTOLIN HFA) 108 (90 Base) MCG/ACT inhaler 1-2 puff (2 puffs Inhalation Given 07/08/19 1739)     Initial Impression / Assessment and Plan / ED Course  I have reviewed the triage vital signs and the nursing notes.  Pertinent labs & imaging results that were available during my care of the patient were reviewed by me and considered in my medical decision making (see chart for details).  Patient seen and examined. Patient nontoxic appearing. He is afebrile, normotensive, no hypoxia on 3L nasal cannula. His lung sounds are diminished throughout, no wheezing noted but patient has increased work of breathing and is speaking in short sentences.  Labs today show CBC without leukocytosis however does show hemoglobin of 8.3. This appears to be down trending from one month ago when it was 9.9. Pt has known iron deficiency anemia  Chart review shows he saw heme Dr. Delton Coombes. His last Feraheme infusion was 04/13/2018 and he is supposed to take iron tablets. His d-dimer today is negative. Chest xray viewed by me without infiltrate. EKG without ischemic changes. Pt attempted to ambulate and became hypoxic to 78% while on 3L.  This case was discussed with Dr. Laverta Baltimore who has seen the patient and agrees with plan to admit for observation with possible COPD exacerbation vs symptomatic anemia. Spoke with Dr. Arlyce Dice with hospitalist service who  agrees to assume care of patient and bring into the hospital for further evaluation and management.     Portions of this note were generated with Lobbyist. Dictation errors may occur despite best attempts at proofreading.    Final Clinical Impressions(s) / ED Diagnoses   Final diagnoses:  COPD exacerbation ([place])  Acute on chronic respiratory failure with hypoxia [facility])    ED Discharge Orders    None       Flint Melter 07/08/19 2116    Margette Fast, MD 07/09/19 1057

## 2019-07-08 NOTE — ED Notes (Signed)
Called ac for med 

## 2019-07-08 NOTE — ED Triage Notes (Addendum)
Pt reports that his oxygen level has been dropping when he gets up and walks since last monday. Pt has chronic O2 at 3 via . Pt states he is coughing due mucinex. Pt states he feels like he has swelling in his stomach. Told to come to ED by the [facility] clinic

## 2019-07-08 NOTE — ED Notes (Signed)
O2 sat 79-83% on 4L [place], dr long made aware, respiratory called to put pt on high flow O2. Pt states he has been like this for a week but did not want to come to the hospital.

## 2019-07-08 NOTE — H&P (Addendum)
History and Physical    GARDNER HEBEL [id number] DOB: 1951-03-09 DOA: 07/08/2019  PCP: [name], MD   Patient coming from: Home  I have personally briefly reviewed patient's old medical records in [place]  Chief Complaint: SOB  HPI: WORTHINGTON MURILLO is a 68 y.o. 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  Patient presented to the ED with complaints of 1 week of worsening cough, progressive difficulty breathing.  Reports that his O2 sats have been dropping so he has increased his oxygen from 3 L to 4 L.  He reports difficulty expectorating sputum, but has had worsening cough productive of cream-colored sputum, unrelieved even with double dose Mucinex. He denies fever or chills, no body aches, no sore throat, no contacts with known Covid positive patients.  Recent hospitalization 9/6 - 9/8 acute on chronic respiratory failure, secondary to COPD and decompensated CHF.  CTA negative for PE.  Patient required IV diuresis with Lasix, steroids, doxycycline.    ED Course: O2 sats dropped to 79 to 83% on 4 L nasal cannula, patient was changed to high flow nasal cannula with sats maintaining > 92%.  Hemoglobin 8.3 down from 12.52 months ago.  BNP 394.  D-dimer unremarkable at 0.4.  WBC 10.4.  EKG shows sinus rhythm with ectopic atrial rhythm.  ED provider notes reduced air entry bilaterally, bronchodilators given.  At the time of my evaluation patient feels better, he is on high flow nasal cannula.  Review of Systems: As per HPI all other systems reviewed and negative.  Past Medical History:  Diagnosis Date  . Depression   . Dextrocardia   . Diabetes mellitus   . H. pylori infection 11/09/2012   treated with pylera  . HTN (hypertension)    Cholesterol  . Iron deficiency anemia, unspecified 10/18/2012  . Neuropathy   . Pneumonia   . Situs inversus totalis   . Tachycardia     Past  Surgical History:  Procedure Laterality Date  . CATARACT EXTRACTION, BILATERAL  2016  . COLONOSCOPY WITH ESOPHAGOGASTRODUODENOSCOPY (EGD) N/A 11/09/2012   Dr. 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 hpylori on bx- treated with pylera. TCS- normal rectum. 1 diminutive polyp in the mid descending segment. 1-64mm polyp in the mid desending segment o/w the remainder of the colonic mucosa appeared normal. tubular adenoma on bx  . GIVENS CAPSULE STUDY N/A 10/07/2013   no source for anemia or heme positive stool noted  . NECK SURGERY     bone spurs     reports that he has never smoked. His smokeless tobacco use includes chew. He reports that he does not drink alcohol or use drugs.  No Known Allergies  Family History  Problem Relation Age of Onset  . Heart attack Mother   . Diabetes Sister        Fx  . Heart defect Sister        Fx  . Arthritis Sister        Fx  . Asthma Sister        Fx  . Kidney disease Sister        Fx  . Pancreatitis Sister     Prior to Admission medications   Medication Sig Start Date End Date Taking? Authorizing Provider  albuterol (PROAIR HFA) 108 (90 Base) MCG/ACT inhaler Inhale 2 puffs into the lungs every 4 (four)  hours as needed for wheezing or shortness of breath. 09/27/16  Yes Valentina Shaggy, MD  aspirin EC 81 MG tablet Take 81 mg by mouth daily.   Yes [provider]  budesonide-formoterol (SYMBICORT) 160-4.5 MCG/ACT inhaler Inhale 2 puffs into the lungs 2 (two) times daily. 05/21/19  Yes Johnson, Clanford L, MD  Cinnamon 500 MG capsule Take 2,000 mg by mouth daily.    Yes [provider]  DULoxetine (CYMBALTA) 60 MG capsule Take 60 mg by mouth daily.   Yes [provider]  fenofibrate (TRICOR) 145 MG tablet Take 145 mg by mouth every evening.  05/28/18  Yes [provider]  ferrous sulfate 325 (65 FE) MG tablet Take 325-650 mg by  mouth See admin instructions. Take two tablets on Monday Wednesday, Friday. And 1 tablet on all other days   Yes [provider]  folic acid (FOLVITE) 1 MG tablet Take 1 mg by mouth daily.   Yes [provider]  furosemide (LASIX) 40 MG tablet Take 1 tablet (40 mg total) by mouth 2 (two) times daily. Patient taking differently: Take 40-60 mg by mouth See admin instructions. 60mg  in the morning and 40mg  in the afternoon 05/21/19  Yes Johnson, Clanford L, MD  gabapentin (NEURONTIN) 600 MG tablet Take 1,200 mg by mouth 3 (three) times daily.   Yes [provider]  insulin detemir (LEVEMIR) 100 UNIT/ML injection Inject 60 Units into the skin at bedtime.    Yes [provider]  losartan (COZAAR) 100 MG tablet Take 100 mg by mouth daily.   Yes [provider]  metFORMIN (GLUCOPHAGE) 1000 MG tablet Take 1,000 mg by mouth 2 (two) times daily with a meal.   Yes [provider]  metoprolol tartrate (LOPRESSOR) 25 MG tablet Take 1 tablet (25 mg total) by mouth 2 (two) times daily. 03/28/19  Yes BranchAlphonse Guild, MD  omeprazole (PRILOSEC) 20 MG capsule Take 20 mg by mouth daily.   Yes [provider]  rOPINIRole (REQUIP) 2 MG tablet Take 2 mg by mouth at bedtime.    Yes [provider]  rosuvastatin (CRESTOR) 40 MG tablet Take 40 mg by mouth every evening.  03/22/18  Yes [provider]  sitaGLIPtin (JANUVIA) 50 MG tablet Take 50 mg by mouth daily.    Yes [provider]  traMADol (ULTRAM) 50 MG tablet Take 50-100 mg by mouth every 6 (six) hours as needed for moderate pain.  07/25/18  Yes [provider]  traZODone (DESYREL) 50 MG tablet Take 150 mg by mouth at bedtime.  07/03/18  Yes [provider]  predniSONE (DELTASONE) 20 MG tablet Take 3 PO QAM x3days, 2 PO QAM x3days, 1 PO QAM x3days Patient not taking: Reported on 07/08/2019 05/21/19   Murlean Iba, MD    Physical Exam: Vitals:   07/08/19  1926 07/08/19 1930 07/08/19 1945 07/08/19 2000  BP:    (!) 135/100  Pulse:  69 70 72  Resp:  17 (!) 24 17  Temp:      TempSrc:      SpO2: 92% 96% 97% 98%  Weight:        Constitutional: calm, comfortable Vitals:   07/08/19 1926 07/08/19 1930 07/08/19 1945 07/08/19 2000  BP:    (!) 135/100  Pulse:  69 70 72  Resp:  17 (!) 24 17  Temp:      TempSrc:      SpO2: 92% 96% 97% 98%  Weight:  Eyes: PERRL, lids and conjunctivae normal ENMT: Mucous membranes are moist. Posterior pharynx clear of any exudate or lesions. Neck: normal, supple, no masses, no thyromegaly Respiratory: No appreciable rhonchi or wheezing, good air entry bilaterally, status post bronchodilator treatment, crackles bilateral bases.  Mild increased work of breathing.  No accessory muscle use.  Cardiovascular: Regular rate and rhythm, no murmurs / rubs / gallops. No extremity edema. 2+ pedal pulses.  Abdomen: no tenderness, no masses palpated. No hepatosplenomegaly. Bowel sounds positive.  Musculoskeletal: no clubbing / cyanosis. No joint deformity upper and lower extremities. Good ROM, no contractures. Normal muscle tone.  Skin: no rashes, lesions, ulcers. No induration Neurologic: CN 2-12 grossly intact. Strength 5/5 in all 4.  Psychiatric: Normal judgment and insight. Alert and oriented x 3. Normal mood.   Labs on Admission: I have personally reviewed following labs and imaging studies  CBC: Recent Labs  Lab 07/08/19 1518  WBC 10.4  NEUTROABS 7.9*  HGB 8.3*  HCT 27.2*  MCV 95.1  PLT 123456   Basic Metabolic Panel: Recent Labs  Lab 07/08/19 1518  NA 135  K 4.7  CL 95*  CO2 28  GLUCOSE 78  BUN 16  CREATININE 0.80  CALCIUM 9.1   CBG: Recent Labs  Lab 07/08/19 1434  GLUCAP 85    Radiological Exams on Admission: Dg Chest 2 View  Result Date: 07/08/2019 CLINICAL DATA:  Shortness of breath. EXAM: CHEST - 2 VIEW COMPARISON:  May 20, 2019. 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 Stable  cardiomediastinal silhouette in terms of size. No pneumothorax or pleural effusion is noted. No acute pulmonary disease is noted. Bony thorax is unremarkable. IMPRESSION: No active cardiopulmonary disease. Electronically Signed   By: Marijo Conception [name]   On: 07/08/2019 13:50    EKG: Independently reviewed.  Sinus rhythm, ectopic atrial rhythm.  QTc 387.  Assessment/Plan Active Problems:   Acute on chronic respiratory failure (HCC)  Acute on chronic hypoxic respiratory failure-history of Kartagener syndrome. Currently on high flow nasal cannula with sats > 92%, at baseline he is on 3 L O2.  Likely from COPD exacerbation.  BNP 394, improved compared to prior hospitalization for decompensated CHF.  D-dimer unremarkable 0.4.  No chest pain.  Low suspicion for thromboembolic disease at this time. -Supplemental O2, respiratory therapy consult -Chest physiotherapy.  COPD exacerbation- worsening purulence and cough.  Improved air entry on my exam without wheezing/rhonchi but crackles present.  He has impaired mucociliary clearance related to his kartagener syndrome, putting him at risk for bronchiectasis and its exacerbations-this may be the cause of his respiratory failure.  Two-view chest x-ray negative for acute abnormality.  WBC 10.4.   -IV azithromycin -DuoNebs and mucolytic scheduled -DuoNebs as needed -IV Solu-Medrol 60 every 12 hr -Resume home bronchodilators  Acute anemia- some of his dyspnea may be secondary to anemia, but he is also hypoxic suggesting COPD.  Hemoglobin 8.3 down from 12.5-  04/14/2019.  Colonoscopy 2014 by Dr. Gala Romney showed colonic polyps, EGD showed gastric erosions. -Iron studies in a.m. -CBC a.m. -Stool occult -Hold home aspirin -GI consult -IV Protonix 40 daily  Diastolic CHF-stable and compensated.  Last echo 03/2018 EF 60 to 65%, indeterminate diastolic function. -Resume home Lasix 40 every 12 hourly.  Hypertension-stable. -Resume home losartan, metoprolol,   Depression-  -Resume home Cymbalta  Diabetes mellitus-random glucose 78.  Likely from poor p.o. intake. -While on steroids will resume home Levemir at 20 units nightly -Hold home Metformin, Januvia for now -SSI -Resume  home gabapentin,   DVT prophylaxis: SCDs Code Status: Full code Family Communication: None at bedside Disposition Plan: Per rounding team Consults called: Gastroenterology Admission status: Observation, telemetry   Ejiroghene Arlyce Dice MD [facility]  07/08/2019, 8:45 PM

## 2019-07-08 NOTE — ED Notes (Signed)
Patient was assisted to a standing position to help void. Patient oxygen saturation on 3 L went from 97% to 78%. Patient assisted back into stretcher. PA  & RN notified.

## 2019-07-08 NOTE — ED Notes (Signed)
Dr. Laural Golden paged to [phone]

## 2019-07-09 ENCOUNTER — Encounter (HOSPITAL_COMMUNITY): Payer: Self-pay | Admitting: Gastroenterology

## 2019-07-09 DIAGNOSIS — I5032 Chronic diastolic (congestive) heart failure: Secondary | ICD-10-CM | POA: Diagnosis present

## 2019-07-09 DIAGNOSIS — J9601 Acute respiratory failure with hypoxia: Secondary | ICD-10-CM | POA: Diagnosis present

## 2019-07-09 DIAGNOSIS — R0902 Hypoxemia: Secondary | ICD-10-CM | POA: Diagnosis present

## 2019-07-09 DIAGNOSIS — K5900 Constipation, unspecified: Secondary | ICD-10-CM | POA: Diagnosis present

## 2019-07-09 DIAGNOSIS — Z6832 Body mass index (BMI) 32.0-32.9, adult: Secondary | ICD-10-CM | POA: Diagnosis not present

## 2019-07-09 DIAGNOSIS — E1140 Type 2 diabetes mellitus with diabetic neuropathy, unspecified: Secondary | ICD-10-CM | POA: Diagnosis present

## 2019-07-09 DIAGNOSIS — Z7951 Long term (current) use of inhaled steroids: Secondary | ICD-10-CM | POA: Diagnosis not present

## 2019-07-09 DIAGNOSIS — Z20828 Contact with and (suspected) exposure to other viral communicable diseases: Secondary | ICD-10-CM | POA: Diagnosis present

## 2019-07-09 DIAGNOSIS — Z7982 Long term (current) use of aspirin: Secondary | ICD-10-CM | POA: Diagnosis not present

## 2019-07-09 DIAGNOSIS — Z720 Tobacco use: Secondary | ICD-10-CM | POA: Diagnosis not present

## 2019-07-09 DIAGNOSIS — F329 Major depressive disorder, single episode, unspecified: Secondary | ICD-10-CM | POA: Diagnosis present

## 2019-07-09 DIAGNOSIS — Z23 Encounter for immunization: Secondary | ICD-10-CM | POA: Diagnosis present

## 2019-07-09 DIAGNOSIS — D649 Anemia, unspecified: Secondary | ICD-10-CM

## 2019-07-09 DIAGNOSIS — Z794 Long term (current) use of insulin: Secondary | ICD-10-CM | POA: Diagnosis not present

## 2019-07-09 DIAGNOSIS — Z79899 Other long term (current) drug therapy: Secondary | ICD-10-CM | POA: Diagnosis not present

## 2019-07-09 DIAGNOSIS — Z9842 Cataract extraction status, left eye: Secondary | ICD-10-CM | POA: Diagnosis not present

## 2019-07-09 DIAGNOSIS — K219 Gastro-esophageal reflux disease without esophagitis: Secondary | ICD-10-CM | POA: Diagnosis not present

## 2019-07-09 DIAGNOSIS — Z8711 Personal history of peptic ulcer disease: Secondary | ICD-10-CM | POA: Diagnosis not present

## 2019-07-09 DIAGNOSIS — J9621 Acute and chronic respiratory failure with hypoxia: Secondary | ICD-10-CM | POA: Diagnosis present

## 2019-07-09 DIAGNOSIS — D509 Iron deficiency anemia, unspecified: Secondary | ICD-10-CM | POA: Diagnosis present

## 2019-07-09 DIAGNOSIS — E6609 Other obesity due to excess calories: Secondary | ICD-10-CM | POA: Diagnosis present

## 2019-07-09 DIAGNOSIS — Z841 Family history of disorders of kidney and ureter: Secondary | ICD-10-CM | POA: Diagnosis not present

## 2019-07-09 DIAGNOSIS — I110 Hypertensive heart disease with heart failure: Secondary | ICD-10-CM | POA: Diagnosis present

## 2019-07-09 DIAGNOSIS — Q893 Situs inversus: Secondary | ICD-10-CM | POA: Diagnosis not present

## 2019-07-09 DIAGNOSIS — Z8719 Personal history of other diseases of the digestive system: Secondary | ICD-10-CM | POA: Diagnosis not present

## 2019-07-09 DIAGNOSIS — J441 Chronic obstructive pulmonary disease with (acute) exacerbation: Secondary | ICD-10-CM | POA: Diagnosis present

## 2019-07-09 DIAGNOSIS — Z9841 Cataract extraction status, right eye: Secondary | ICD-10-CM | POA: Diagnosis not present

## 2019-07-09 DIAGNOSIS — R195 Other fecal abnormalities: Secondary | ICD-10-CM | POA: Diagnosis present

## 2019-07-09 LAB — GLUCOSE, CAPILLARY
Glucose-Capillary: 178 mg/dL — ABNORMAL HIGH (ref 70–99)
Glucose-Capillary: 179 mg/dL — ABNORMAL HIGH (ref 70–99)
Glucose-Capillary: 193 mg/dL — ABNORMAL HIGH (ref 70–99)
Glucose-Capillary: 211 mg/dL — ABNORMAL HIGH (ref 70–99)

## 2019-07-09 LAB — CBC
HCT: 28.2 % — ABNORMAL LOW (ref 39.0–52.0)
Hemoglobin: 8.6 g/dL — ABNORMAL LOW (ref 13.0–17.0)
MCH: 29.2 pg (ref 26.0–34.0)
MCHC: 30.5 g/dL (ref 30.0–36.0)
MCV: 95.6 fL (ref 80.0–100.0)
Platelets: 407 10*3/uL — ABNORMAL HIGH (ref 150–400)
RBC: 2.95 MIL/uL — ABNORMAL LOW (ref 4.22–5.81)
RDW: 14.3 % (ref 11.5–15.5)
WBC: 13.5 10*3/uL — ABNORMAL HIGH (ref 4.0–10.5)
nRBC: 0 % (ref 0.0–0.2)

## 2019-07-09 LAB — FERRITIN: Ferritin: 182 ng/mL (ref 24–336)

## 2019-07-09 LAB — IRON AND TIBC
Iron: 30 ug/dL — ABNORMAL LOW (ref 45–182)
Saturation Ratios: 7 % — ABNORMAL LOW (ref 17.9–39.5)
TIBC: 449 ug/dL (ref 250–450)
UIBC: 419 ug/dL

## 2019-07-09 LAB — SARS CORONAVIRUS 2 (TAT 6-24 HRS): SARS Coronavirus 2: NEGATIVE

## 2019-07-09 MED ORDER — ALPRAZOLAM 0.25 MG PO TABS
0.2500 mg | ORAL_TABLET | Freq: Two times a day (BID) | ORAL | Status: DC | PRN
  Administered 2019-07-09 – 2019-07-10 (×4): 0.25 mg via ORAL
  Filled 2019-07-09 (×4): qty 1

## 2019-07-09 MED ORDER — POLYETHYLENE GLYCOL 3350 17 G PO PACK
17.0000 g | PACK | Freq: Once | ORAL | Status: AC
  Administered 2019-07-09: 13:00:00 17 g via ORAL
  Filled 2019-07-09: qty 1

## 2019-07-09 MED ORDER — SODIUM CHLORIDE 0.9 % IV SOLN
510.0000 mg | Freq: Once | INTRAVENOUS | Status: AC
  Administered 2019-07-09: 510 mg via INTRAVENOUS
  Filled 2019-07-09: qty 17

## 2019-07-09 MED ORDER — FUROSEMIDE 40 MG PO TABS
40.0000 mg | ORAL_TABLET | Freq: Every day | ORAL | Status: DC
  Administered 2019-07-09 – 2019-07-10 (×2): 40 mg via ORAL
  Filled 2019-07-09 (×2): qty 1

## 2019-07-09 MED ORDER — FUROSEMIDE 40 MG PO TABS
60.0000 mg | ORAL_TABLET | Freq: Every day | ORAL | Status: DC
  Administered 2019-07-09 – 2019-07-11 (×3): 60 mg via ORAL
  Filled 2019-07-09 (×3): qty 1

## 2019-07-09 NOTE — Consult Note (Signed)
Referring Provider: Jenetta Downer, MD Primary Care Physician:  [name], MD Primary Gastroenterologist:  Dr. Gala Romney  Date of Admission: 07/08/19 Date of Consultation: 07/09/19  Reason for Consultation:  Anemia  HPI:  Brett [name] is a 68 y.o. 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  Also with recent hospitalization from 9/6-9/8 with acute on chronic respiratory failure secondary to COPD and decompensated CHF.  ED Course: O2 sats dropped to 79 to 83% on 4 L nasal cannula, patient was changed to high flow nasal cannula with sats maintaining > 92%.  Hemoglobin 8.3 down from 12.5 2 months ago.  BNP 394.  D-dimer unremarkable at 0.4.  WBC 10.4.  EKG shows sinus rhythm with ectopic atrial rhythm.  ED provider notes reduced air entry bilaterally, bronchodilators given.  Fecal occult blood card has not been completed.  Ferritin normal 182.  Iron low at 30 and saturation ratio is low at 7%.  BMP essentially normal other than chloride slightly low at 95.    Patient is a difficult historian. He is very frustrated during my visit and feels he is not getting adequate care. Difficult to discuss his current symptoms as he is frustrated that he has not had any food since admission. Today he states he went to the doctor yesterday for a sugar check. He told them he couldn't breathe, so he was sent to the emergency room. SOB has been worsening since last Monday. Worsening cough with a lot of phlegm/mucous. States he doesn't use his inhalers because he can't. States he doesn't have help at home. Wife works "all the time." Chews tobacco.   Not sure if he has  been taking his iron pills. Verified with wife as she enetered during my visit. Patient has been taking his iron tablets. Denies bright red blood per rectum or black stool. States the stools are dark, but not black. Typically has constipation. Will have to take a laxative. Taking laxative daily. OTC. Otherwise will have BM every 3-4 days. Stools are typically hard. Softer with laxatives. No abdominal pain. No nausea or vomiting. No heartburn, acid reflux, or dysphagia. Usually takes tylenol. No NSAIDs.   Prior GI Evaluation: EGD on 11/09/2012 with normal esophagus, reverse stomach consistent with situs inversus, small tiny antral erosions and gastric erythema involving primarily the antrum, no ulcer or infiltrating process.  Small hiatal hernia.  Pathology with chronic active gastritis with H. Pylori (s/p treatment).  No dysplasia or malignancy.   Colonoscopy on 11/01/2012 with 2 polyps, otherwise remainder of the colonic mucosa appeared normal, terminal ileal mucosa appeared normal.  Pathology with tubular adenomas.  Recommended repeat colonoscopy in 5 years. Givens capsule study completed in January 2015 with no source of anemia or heme positive stool.  He has not been seen in our office since January 2016.   Past Medical History:  Diagnosis Date  . Depression   . Dextrocardia   . Diabetes mellitus   . H. pylori infection 11/09/2012   treated with pylera  . HTN (hypertension)    Cholesterol  . Iron deficiency anemia, unspecified 10/18/2012  . Neuropathy   . Pneumonia   . Situs inversus totalis   . Tachycardia  Past Surgical History:  Procedure Laterality Date  . CATARACT EXTRACTION, BILATERAL  2016  . COLONOSCOPY WITH ESOPHAGOGASTRODUODENOSCOPY (EGD) N/A 11/09/2012   Dr. 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 hpylori on bx- treated with pylera. TCS- normal rectum. 1 diminutive polyp in the mid  descending segment. 1-12mm polyp in the mid desending segment o/w the remainder of the colonic mucosa appeared normal. tubular adenoma on bx  . GIVENS CAPSULE STUDY N/A 10/07/2013   no source for anemia or heme positive stool noted  . NECK SURGERY     bone spurs    Prior to Admission medications   Medication Sig Start Date End Date Taking? Authorizing Provider  albuterol (PROAIR HFA) 108 (90 Base) MCG/ACT inhaler Inhale 2 puffs into the lungs every 4 (four) hours as needed for wheezing or shortness of breath. 09/27/16  Yes Valentina Shaggy, MD  aspirin EC 81 MG tablet Take 81 mg by mouth daily.   Yes [provider]  budesonide-formoterol (SYMBICORT) 160-4.5 MCG/ACT inhaler Inhale 2 puffs into the lungs 2 (two) times daily. 05/21/19  Yes Johnson, Clanford L, MD  Cinnamon 500 MG capsule Take 2,000 mg by mouth daily.    Yes [provider]  DULoxetine (CYMBALTA) 60 MG capsule Take 60 mg by mouth daily.   Yes [provider]  fenofibrate (TRICOR) 145 MG tablet Take 145 mg by mouth every evening.  05/28/18  Yes [provider]  ferrous sulfate 325 (65 FE) MG tablet Take 325-650 mg by mouth See admin instructions. Take two tablets on Monday Wednesday, Friday. And 1 tablet on all other days   Yes [provider]  folic acid (FOLVITE) 1 MG tablet Take 1 mg by mouth daily.   Yes [provider]  furosemide (LASIX) 40 MG tablet Take 1 tablet (40 mg total) by mouth 2 (two) times daily. Patient taking differently: Take 40-60 mg by mouth See admin instructions. 60mg  in the morning and 40mg  in the afternoon 05/21/19  Yes Johnson, Clanford L, MD  gabapentin (NEURONTIN) 600 MG tablet Take 1,200 mg by mouth 3 (three) times daily.   Yes [provider]  insulin detemir (LEVEMIR) 100 UNIT/ML injection Inject 60 Units into the skin at bedtime.    Yes [provider]  losartan (COZAAR) 100 MG tablet Take 100 mg by mouth daily.   Yes [provider]  metFORMIN (GLUCOPHAGE) 1000 MG tablet Take 1,000 mg by mouth 2 (two) times daily with a meal.   Yes [provider]  metoprolol tartrate (LOPRESSOR) 25 MG tablet Take 1 tablet (25 mg total) by mouth 2 (two) times daily. 03/28/19  Yes BranchAlphonse Guild, MD  omeprazole (PRILOSEC) 20 MG capsule Take 20 mg by mouth daily.   Yes [provider]  rOPINIRole (REQUIP) 2 MG tablet Take 2 mg by mouth at bedtime.    Yes [provider]  rosuvastatin (CRESTOR) 40 MG tablet Take 40 mg by mouth every evening.  03/22/18  Yes [provider]  sitaGLIPtin (JANUVIA) 50 MG tablet Take 50 mg by mouth daily.    Yes [provider]  traMADol (ULTRAM) 50 MG tablet Take 50-100 mg by mouth every 6 (six) hours as needed for moderate pain.  07/25/18  Yes [provider]  traZODone (DESYREL) 50 MG tablet Take 150 mg by mouth at bedtime.  07/03/18  Yes [provider]  predniSONE (DELTASONE) 20 MG tablet Take 3 PO  QAM x3days, 2 PO QAM x3days, 1 PO QAM x3days Patient not taking: Reported on 07/08/2019 05/21/19   Murlean Iba, MD    Current Facility-Administered Medications  Medication Dose Route Frequency Provider Last Rate Last Dose  . acetaminophen (TYLENOL) tablet 650 mg  650 mg Oral Q6H PRN Emokpae, Ejiroghene E, MD       Or  . acetaminophen (TYLENOL) suppository 650 mg  650 mg Rectal Q6H PRN Emokpae, Ejiroghene E, MD      . azithromycin (ZITHROMAX) 500 mg in sodium chloride 0.9 % 250 mL IVPB  500 mg Intravenous Q24H Emokpae, Ejiroghene E, MD   Stopped at 07/09/19 0604  . DULoxetine (CYMBALTA) DR capsule 60 mg  60 mg Oral Daily Emokpae, Ejiroghene E, MD      . fenofibrate tablet 54 mg  54 mg Oral Daily Emokpae, Ejiroghene E, MD      . furosemide (LASIX) tablet 40 mg  40 mg Oral q1800 Manuella Ghazi, Pratik D, DO      . furosemide (LASIX) tablet 60 mg  60 mg Oral Q breakfast Manuella Ghazi, Pratik D, DO      . gabapentin (NEURONTIN) capsule 1,200 mg  1,200  mg Oral TID Emokpae, Ejiroghene E, MD   1,200 mg at 07/09/19 0032  . guaiFENesin-dextromethorphan (ROBITUSSIN DM) 100-10 MG/5ML syrup 15 mL  15 mL Oral Q8H Emokpae, Ejiroghene E, MD   15 mL at 07/09/19 0031  . influenza vaccine adjuvanted (FLUAD) injection 0.5 mL  0.5 mL Intramuscular Tomorrow-1000 Emokpae, Ejiroghene E, MD      . insulin aspart (novoLOG) injection 0-15 Units  0-15 Units Subcutaneous TID WC Emokpae, Ejiroghene E, MD      . insulin aspart (novoLOG) injection 0-5 Units  0-5 Units Subcutaneous QHS Emokpae, Ejiroghene E, MD      . insulin detemir (LEVEMIR) injection 20 Units  20 Units Subcutaneous QHS Emokpae, Ejiroghene E, MD   20 Units at 07/09/19 0033  . ipratropium-albuterol (DUONEB) 0.5-2.5 (3) MG/3ML nebulizer solution 3 mL  3 mL Nebulization Q4H PRN Emokpae, Ejiroghene E, MD      . ipratropium-albuterol (DUONEB) 0.5-2.5 (3) MG/3ML nebulizer solution 3 mL  3 mL Nebulization Q6H Emokpae, Ejiroghene E, MD   3 mL at 07/09/19 0818  . losartan (COZAAR) tablet 100 mg  100 mg Oral Daily Emokpae, Ejiroghene E, MD      . methylPREDNISolone sodium succinate (SOLU-MEDROL) 125 mg/2 mL injection 60 mg  60 mg Intravenous Q12H Emokpae, Ejiroghene E, MD      . metoprolol tartrate (LOPRESSOR) tablet 25 mg  25 mg Oral BID Emokpae, Ejiroghene E, MD   25 mg at 07/09/19 0032  . mometasone-formoterol (DULERA) 200-5 MCG/ACT inhaler 2 puff  2 puff Inhalation BID Emokpae, Ejiroghene E, MD   2 puff at 07/09/19 [id number]  . ondansetron (ZOFRAN) tablet 4 mg  4 mg Oral Q6H PRN Emokpae, Ejiroghene E, MD       Or  . ondansetron (ZOFRAN) injection 4 mg  4 mg Intravenous Q6H PRN Emokpae, Ejiroghene E, MD      . pantoprazole (PROTONIX) injection 40 mg  40 mg Intravenous Q24H Emokpae, Ejiroghene E, MD   40 mg at 07/09/19 0031  . polyethylene glycol (MIRALAX / GLYCOLAX) packet 17 g  17 g Oral Daily PRN Emokpae, Ejiroghene E, MD      . rOPINIRole (REQUIP) tablet 2 mg  2 mg Oral QHS Emokpae, Ejiroghene E, MD      .  rosuvastatin (CRESTOR) tablet 40 mg  40 mg Oral QPM Emokpae, Ejiroghene E, MD      . traZODone (DESYREL) tablet 150 mg  150 mg Oral QHS Emokpae, Ejiroghene E, MD   150 mg at 07/09/19 0031    Allergies as of 07/08/2019  . (No Known Allergies)    Family History  Problem Relation Age of Onset  . Heart attack Mother   . Diabetes Sister        Fx  . Heart defect Sister        Fx  . Arthritis Sister        Fx  . Asthma Sister        Fx  . Kidney disease Sister        Fx  . Pancreatitis Sister     Social History   Socioeconomic History  . Marital status: Married    Spouse name: Not on file  . Number of children: 0  . Years of education: 7th grade   . Highest education level: Not on file  Occupational History  . Occupation: Lexicographer  . Financial resource strain: Not on file  . Food insecurity    Worry: Not on file    Inability: Not on file  . Transportation needs    Medical: Not on file    Non-medical: Not on file  Tobacco Use  . Smoking status: Never Smoker  . Smokeless tobacco: Current User    Types: Chew  Substance and Sexual Activity  . Alcohol use: No  . Drug use: No  . Sexual activity: Not on file  Lifestyle  . Physical activity    Days per week: Not on file    Minutes per session: Not on file  . Stress: Not on file  Relationships  . Social Herbalist on phone: Not on file    Gets together: Not on file    Attends religious service: Not on file    Active member of club or organization: Not on file    Attends meetings of clubs or organizations: Not on file    Relationship status: Not on file  . Intimate partner violence    Fear of current or ex partner: Not on file    Emotionally abused: Not on file    Physically abused: Not on file    Forced sexual activity: Not on file  Other Topics Concern  . Not on file  Social History Narrative  . Not on file    Review of Systems: Gen: Denies fever, chills. No unintentional weight  loss. Occasional lightheadedness.  CV: Admits to some chest tightness.  Denies palpitations. Resp: See HPI GI: See HPI.  GU : Denies urinary burning, urinary frequency, urinary incontinence. Admits to hesitancy. MS: Admits to diffuse joint pain. States he has neuropathy.   Derm: Denies rash Psych: Admits to depression, anxiety Heme: Denies bruising, bleeding  Physical Exam: Vital signs in last 24 hours: Temp:  [97.5 F (36.4 C)] 97.5 F (36.4 C) (10/26 1317) Pulse Rate:  [54-75] 62 (10/27 0656) Resp:  [13-24] 13 (10/27 0656) BP: (111-160)/(48-102) 135/56 (10/27 0600) SpO2:  [81 %-100 %] 98 % (10/27 0818) Weight:  [105.2 kg] 105.2 kg (10/26 1317)   General:   Alert,  well-developed, well-nourished. NAD. Very aggravated throughout my visit.  Head:  Normocephalic and atraumatic. Eyes:  Sclera clear, no icterus.   Conjunctiva pink. Ears:  Normal auditory acuity. Nose:  No deformity, discharge,  or lesions. Lungs:  Wheezing noted  throughout. Mild crackles in the bases bilaterally.  Heart:  Regular rate and rhythm; no murmurs, clicks, rubs,  or gallops. Abdomen:  Soft, nontender and nondistended. No masses, hepatosplenomegaly or hernias noted. Normal bowel sounds, without guarding, and without rebound.   Rectal:  Deferred  Msk:  Symmetrical without gross deformities.  Extremities:  Without edema. Neurologic:  Alert and  oriented x4;  grossly normal neurologically. Skin:  Intact without significant lesions or rashes.  Intake/Output from previous day: 10/26 0701 - 10/27 0700 In: -  Out: 1400 [Urine:1400] Intake/Output this shift: Total I/O In: -  Out: 400 [Urine:400]  Lab Results: Recent Labs    07/08/19 1518 07/09/19 0458  WBC 10.4 13.5*  HGB 8.3* 8.6*  HCT 27.2* 28.2*  PLT 375 407*   BMET Recent Labs    07/08/19 1518  NA 135  K 4.7  CL 95*  CO2 28  GLUCOSE 78  BUN 16  CREATININE 0.80  CALCIUM 9.1    Studies/Results: Dg Chest 2 View  Result Date:  07/08/2019 CLINICAL DATA:  Shortness of breath. EXAM: CHEST - 2 VIEW COMPARISON:  May 20, 2019. 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 Stable cardiomediastinal silhouette in terms of size. No pneumothorax or pleural effusion is noted. No acute pulmonary disease is noted. Bony thorax is unremarkable. IMPRESSION: No active cardiopulmonary disease. Electronically Signed   By: Marijo Conception [name]   On: 07/08/2019 13:50    Impression: 68 y.o. 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  Patient's O2 sats dropped to 79-83% on 4 L nasal cannula and was transitioned to high flow nasal cannula.  He was on 5 L today.  Labs revealed hemoglobin of 8.3 on 10/26 which is down from 12.5 two months ago.  Hemoglobin stable and slightly improved to 8.6 today.  Iron studies with ferritin normal, iron slightly low at 30, percent saturation low at 7%. Patient is without any overt GI bleeding. Reports dark stools, but not black and he is on oral iron.  Denies abdominal pain, nausea, vomiting, or GERD symptoms on omeprazole daily.  No unintentional weight loss.  No NSAIDs.  He has been on oral iron outpatient.  Last Feraheme infusion was 04/13/2018.   Doubt significant upper GI source for worsening anemia as patient is essentially asymptomatic from an upper GI standpoint, no regular NSAID use, and has been on omeprazole daily. He is overdue to TCS to evaluate for polyps vs malignancy. Regardless, patient is not stable to undergo procedures at this time due to his respiratory status. Will continue to monitor symptoms and H&H. His hemoglobin is stable at this  point.   Constipation: Uses laxatives daily. Otherwise with hard BMs every 3-4 days. Add MiraLAX x 1 today then prn.   Plan: Continue to monitor for overt GI bleeding.  Follow H/H Consider endoscopic evaluation when respiratory status improves.    LOS: 0 days    07/09/2019, 8:44 AM   Aliene Altes, PA-C [facility]

## 2019-07-09 NOTE — Progress Notes (Signed)
PROGRESS NOTE    Brett [name]  [id number] DOB: 07/24/1951 DOA: 07/08/2019 PCP: [name], MD   Brief Narrative:  Per HPI: Brett [name] is a 68 y.o. 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  Patient presented to the ED with complaints of 1 week of worsening cough, progressive difficulty breathing.  Reports that his O2 sats have been dropping so he has increased his oxygen from 3 L to 4 L.  He reports difficulty expectorating sputum, but has had worsening cough productive of cream-colored sputum, unrelieved even with double dose Mucinex. He denies fever or chills, no body aches, no sore throat, no contacts with known Covid positive patients.  Recent hospitalization 9/6 - 9/8 acute on chronic respiratory failure, secondary to COPD and decompensated CHF.  CTA negative for PE.  Patient required IV diuresis with Lasix, steroids, doxycycline.   10/27: Patient has been admitted with acute COPD exacerbation along with acute anemia.  He is noted to have dark stools, but does take iron supplementation and has history of iron deficiency.  Iron panel shows ongoing deficiency for which we will administer Feraheme today.  Diet may be started per GI recommendations.  Patient does see Dr. Luan Pulling as his pulmonologist.  Assessment & Plan:   Active Problems:   Acute on chronic respiratory failure (HCC)   Acute on chronic hypoxemic respiratory failure in the setting of COPD exacerbation -Patient has history of Kartagener syndrome and situs inversus -Wears 3 L nasal cannula oxygen at home. -Follows with Dr. Luan Pulling in the outpatient setting, consider consultation as needed -Continue IV azithromycin -Continue duo nebs and mucolytic's -Continue IV Solu-Medrol 60 mg twice daily -Chest PT -Covid testing negative  Acute anemia-stable -Iron studies with significant deficiency and will order Feraheme  -Occult stool pending -Hemoglobin on repeat CBC currently stable -Hold home aspirin -Appreciate GI evaluation with plans for possible endoscopy once respiratory situation improves -Protonix IV 40 mg daily  Diastolic CHF-stable -Last echocardiogram 03/2018 EF 60-65% with indeterminate diastolic function -Continue home Lasix as scheduled  Hypertension-stable -Continue home losartan and metoprolol  Depression -Continue Cymbalta  Diabetes mellitus -Continue SSI and hold home medications for now -Continue Levemir -Resume gabapentin for diabetic neuropathy   DVT prophylaxis: SCDs Code Status: Full Family Communication: None at bedside Disposition Plan: Appreciate GI evaluation, continue ongoing treatments for COPD exacerbation.   Consultants:   GI  Procedures:   None  Antimicrobials:  Anti-infectives (From admission, onward)   Start     Dose/Rate Route Frequency Ordered Stop   07/08/19 2330  azithromycin (ZITHROMAX) 500 mg in sodium chloride 0.9 % 250 mL IVPB     500 mg 250 mL/hr over 60 Minutes Intravenous Every 24 hours 07/08/19 2319         Subjective: Patient seen and evaluated today and is demanding to eat.  He denies any significant concerns aside from some mild coughing and shortness of breath this morning.  Objective: Vitals:   07/09/19 0400 07/09/19 0600 07/09/19 0656 07/09/19 0818  BP: (!) 123/56 (!) 135/56    Pulse: (!) 59 (!) 54 62   Resp: 14 14 13    Temp:      TempSrc:      SpO2: 99% 100% 100% 98%  Weight:        Intake/Output Summary (Last 24 hours) at 07/09/2019 1208 Last data filed at 07/09/2019 0800 Gross per 24 hour  Intake -  Output  1800 ml  Net -1800 ml   Filed Weights   07/08/19 1317  Weight: 105.2 kg    Examination:  General exam: Appears calm and comfortable  Respiratory system: Clear to auscultation. Respiratory effort normal.  Currently on 6 L nasal cannula oxygen. Cardiovascular system: S1 & S2 heard, RRR. No JVD,  murmurs, rubs, gallops or clicks. No pedal edema. Gastrointestinal system: Abdomen is nondistended, soft and nontender. No organomegaly or masses felt. Normal bowel sounds heard. Central nervous system: Alert and oriented. No focal neurological deficits. Extremities: Symmetric 5 x 5 power. Skin: No rashes, lesions or ulcers Psychiatry: Judgement and insight appear normal. Mood & affect appropriate.     Data Reviewed: I have personally reviewed following labs and imaging studies  CBC: Recent Labs  Lab 07/08/19 1518 07/09/19 0458  WBC 10.4 13.5*  NEUTROABS 7.9*  --   HGB 8.3* 8.6*  HCT 27.2* 28.2*  MCV 95.1 95.6  PLT 375 [id number]*   Basic Metabolic Panel: Recent Labs  Lab 07/08/19 1518  NA 135  K 4.7  CL 95*  CO2 28  GLUCOSE 78  BUN 16  CREATININE 0.80  CALCIUM 9.1   GFR: Estimated Creatinine Clearance: 105 mL/min (by C-G formula based on SCr of 0.8 mg/dL). Liver Function Tests: No results for input(s): AST, ALT, ALKPHOS, BILITOT, PROT, ALBUMIN in the last 168 hours. No results for input(s): LIPASE, AMYLASE in the last 168 hours. No results for input(s): AMMONIA in the last 168 hours. Coagulation Profile: No results for input(s): INR, PROTIME in the last 168 hours. Cardiac Enzymes: No results for input(s): CKTOTAL, CKMB, CKMBINDEX, TROPONINI in the last 168 hours. BNP (last 3 results) No results for input(s): PROBNP in the last 8760 hours. HbA1C: No results for input(s): HGBA1C in the last 72 hours. CBG: Recent Labs  Lab 07/08/19 1434 07/08/19 2151 07/09/19 0022  GLUCAP 85 195* 179*   Lipid Profile: No results for input(s): CHOL, HDL, LDLCALC, TRIG, CHOLHDL, LDLDIRECT in the last 72 hours. Thyroid Function Tests: No results for input(s): TSH, T4TOTAL, FREET4, T3FREE, THYROIDAB in the last 72 hours. Anemia Panel: Recent Labs    07/08/19 1518  FERRITIN 182  TIBC 449  IRON 30*   Sepsis Labs: No results for input(s): PROCALCITON, LATICACIDVEN in the last  168 hours.  Recent Results (from the past 240 hour(s))  SARS CORONAVIRUS 2 (TAT 6-24 HRS) Nasopharyngeal Nasopharyngeal Swab     Status: None   Collection Time: 07/08/19  4:41 PM   Specimen: Nasopharyngeal Swab  Result Value Ref Range Status   SARS Coronavirus 2 NEGATIVE NEGATIVE Final    Comment: (NOTE) SARS-CoV-2 target nucleic acids are NOT DETECTED. The SARS-CoV-2 RNA is generally detectable in upper and lower respiratory specimens during the acute phase of infection. Negative results do not preclude SARS-CoV-2 infection, do not rule out co-infections with other pathogens, and should not be used as the sole basis for treatment or other patient management decisions. Negative results must be combined with clinical observations, patient history, and epidemiological information. The expected result is Negative. Fact Sheet for Patients: SugarRoll.be Fact Sheet for Healthcare Providers: https://www.woods-mathews.com/ This test is not yet approved or cleared by the [place] FDA and  has been authorized for detection and/or diagnosis of SARS-CoV-2 by FDA under an Emergency Use Authorization (EUA). This EUA will remain  in effect (meaning this test can be used) for the duration of the COVID-19 declaration under Section 56 4(b)(1) of the Act, 21 U.S.C. section 360bbb-3(b)(1), unless the  authorization is terminated or revoked sooner. Performed at [facility], [place] [address], [place], [place] [address]          Radiology Studies: Dg Chest 2 View  Result Date: 07/08/2019 CLINICAL DATA:  Shortness of breath. EXAM: CHEST - 2 VIEW COMPARISON:  May 20, 2019. 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 Stable cardiomediastinal silhouette in terms of size. No pneumothorax or pleural effusion is noted. No acute pulmonary disease is noted. Bony thorax is unremarkable. IMPRESSION: No active cardiopulmonary disease. Electronically Signed    By: Marijo Conception [name]   On: 07/08/2019 13:50        Scheduled Meds: . DULoxetine  60 mg Oral Daily  . fenofibrate  54 mg Oral Daily  . furosemide  40 mg Oral q1800  . furosemide  60 mg Oral Q breakfast  . gabapentin  1,200 mg Oral TID  . guaiFENesin-dextromethorphan  15 mL Oral Q8H  . insulin aspart  0-15 Units Subcutaneous TID WC  . insulin aspart  0-5 Units Subcutaneous QHS  . insulin detemir  20 Units Subcutaneous QHS  . ipratropium-albuterol  3 mL Nebulization Q6H  . losartan  100 mg Oral Daily  . methylPREDNISolone (SOLU-MEDROL) injection  60 mg Intravenous Q12H  . metoprolol tartrate  25 mg Oral BID  . mometasone-formoterol  2 puff Inhalation BID  . pantoprazole (PROTONIX) IV  40 mg Intravenous Q24H  . polyethylene glycol  17 g Oral Once  . rOPINIRole  2 mg Oral QHS  . rosuvastatin  40 mg Oral QPM  . traZODone  150 mg Oral QHS   Continuous Infusions: . azithromycin Stopped (07/09/19 0604)  . ferumoxytol       LOS: 0 days    Time spent: 30 minutes    Idolina Mantell Darleen Crocker, DO [facility] Pager [phone]  If 7PM-7AM, please contact night-coverage [email or web address] Password [email or web address] 07/09/2019, 12:08 PM

## 2019-07-09 NOTE — ED Notes (Signed)
Pt asleep.

## 2019-07-09 NOTE — Care Management Obs Status (Signed)
[place] NOTIFICATION   Patient Details  Name: Brett [name] MRN: [id number] Date of Birth: 1951/04/01   Medicare Observation Status Notification Given:  Yes    Tommy Medal 07/09/2019, 1:35 PM

## 2019-07-10 DIAGNOSIS — J441 Chronic obstructive pulmonary disease with (acute) exacerbation: Secondary | ICD-10-CM | POA: Diagnosis not present

## 2019-07-10 DIAGNOSIS — D649 Anemia, unspecified: Secondary | ICD-10-CM | POA: Diagnosis not present

## 2019-07-10 DIAGNOSIS — J9621 Acute and chronic respiratory failure with hypoxia: Secondary | ICD-10-CM | POA: Diagnosis not present

## 2019-07-10 DIAGNOSIS — K5900 Constipation, unspecified: Secondary | ICD-10-CM | POA: Diagnosis not present

## 2019-07-10 LAB — CBC
HCT: 25.7 % — ABNORMAL LOW (ref 39.0–52.0)
Hemoglobin: 7.8 g/dL — ABNORMAL LOW (ref 13.0–17.0)
MCH: 28.6 pg (ref 26.0–34.0)
MCHC: 30.4 g/dL (ref 30.0–36.0)
MCV: 94.1 fL (ref 80.0–100.0)
Platelets: 386 10*3/uL (ref 150–400)
RBC: 2.73 MIL/uL — ABNORMAL LOW (ref 4.22–5.81)
RDW: 14.4 % (ref 11.5–15.5)
WBC: 17.5 10*3/uL — ABNORMAL HIGH (ref 4.0–10.5)
nRBC: 0 % (ref 0.0–0.2)

## 2019-07-10 LAB — BASIC METABOLIC PANEL
Anion gap: 10 (ref 5–15)
BUN: 18 mg/dL (ref 8–23)
CO2: 31 mmol/L (ref 22–32)
Calcium: 9.2 mg/dL (ref 8.9–10.3)
Chloride: 90 mmol/L — ABNORMAL LOW (ref 98–111)
Creatinine, Ser: 0.78 mg/dL (ref 0.61–1.24)
GFR calc Af Amer: >60 mL/min (ref >60)
GFR calc non Af Amer: >60 mL/min (ref >60)
Glucose, Bld: 218 mg/dL — ABNORMAL HIGH (ref 70–99)
Potassium: 5.3 mmol/L — ABNORMAL HIGH (ref 3.5–5.1)
Sodium: 131 mmol/L — ABNORMAL LOW (ref 135–145)

## 2019-07-10 LAB — GLUCOSE, CAPILLARY
Glucose-Capillary: 179 mg/dL — ABNORMAL HIGH (ref 70–99)
Glucose-Capillary: 176 mg/dL — ABNORMAL HIGH (ref 70–99)
Glucose-Capillary: 195 mg/dL — ABNORMAL HIGH (ref 70–99)
Glucose-Capillary: 183 mg/dL — ABNORMAL HIGH (ref 70–99)

## 2019-07-10 MED ORDER — IPRATROPIUM-ALBUTEROL 0.5-2.5 (3) MG/3ML IN SOLN
3.0000 mL | Freq: Four times a day (QID) | RESPIRATORY_TRACT | Status: DC
  Administered 2019-07-10 – 2019-07-11 (×3): 3 mL via RESPIRATORY_TRACT
  Filled 2019-07-10 (×4): qty 3

## 2019-07-10 MED ORDER — FLUTICASONE PROPIONATE 50 MCG/ACT NA SUSP
2.0000 | Freq: Every day | NASAL | Status: DC
  Administered 2019-07-10 – 2019-07-11 (×2): 2 via NASAL
  Filled 2019-07-10: qty 16

## 2019-07-10 MED ORDER — BUDESONIDE 0.5 MG/2ML IN SUSP
0.5000 mg | Freq: Two times a day (BID) | RESPIRATORY_TRACT | Status: DC
  Administered 2019-07-10 – 2019-07-11 (×2): 0.5 mg via RESPIRATORY_TRACT
  Filled 2019-07-10 (×2): qty 2

## 2019-07-10 MED ORDER — DOXYCYCLINE HYCLATE 100 MG PO TABS
100.0000 mg | ORAL_TABLET | Freq: Two times a day (BID) | ORAL | Status: DC
  Administered 2019-07-10 – 2019-07-11 (×3): 100 mg via ORAL
  Filled 2019-07-10 (×3): qty 1

## 2019-07-10 MED ORDER — LORATADINE 10 MG PO TABS
10.0000 mg | ORAL_TABLET | Freq: Every day | ORAL | Status: DC
  Administered 2019-07-10 – 2019-07-11 (×2): 10 mg via ORAL
  Filled 2019-07-10 (×2): qty 1

## 2019-07-10 NOTE — TOC Initial Note (Signed)
Transition of Care [facility]) - Initial/Assessment Note    Patient Details  Name: Brett [name] MRN: [id number] Date of Birth: 09/10/1951  [facility] CM/SW Contact:    Shade Flood, LCSW Phone Number: 07/10/2019, 4:22 PM  Clinical Narrative:                  Pt admitted from home. Received TOC consult to see pt for needs for assistance in the home. Spoke with pt today to assess. Per pt, he and his wife provide care to pt's sister who lives with them. Pt's wife works 12 hour days and pt's sister does go for care at the [employer] center. Pt states that since he has been in and out of the hospital and also not feeling well at home, it has been harder for him to help take care of his sister. Per pt, they have been trying to get CAP assistance in the home and it sounds like they have a meeting coming up in early November for assessment. Per pt, he thinks it could be another couple of weeks after that before they get the help.   Pt states that the reason he always is in a hurry to leave the hospital is so that he can get home to care for his sister and so his wife doesn't have to call out of work to take care of her. Explained to pt that this LCSW might not be able to do anything to speed up the process but that attempt would be made to reach someone at CAP.  Called the Aging and Disability office [phone]) and could only leave a voicemail message for Baker Janus in the CAP program. Requested that [facility] contact pt on his cell phone at his request to determine if there was any way to expedite the assessment and initiation of services.   Covering TOC will be available tomorrow if there are any other needs.   Expected Discharge Plan: Home/Self Care Barriers to Discharge: Continued Medical Work up   Patient Goals and CMS Choice Patient states their goals for this hospitalization and ongoing recovery are:: Return home as fast as possible      Expected Discharge Plan and Services Expected  Discharge Plan: Home/Self Care       Living arrangements for the past 2 months: Single Family Home                                      Prior Living Arrangements/Services Living arrangements for the past 2 months: Single Family Home Lives with:: Spouse, Siblings Patient language and need for interpreter reviewed:: Yes Do you feel safe going back to the place where you live?: Yes      Need for Family Participation in Patient Care: No (Comment) Care giver support system in place?: Yes (comment)   Criminal Activity/Legal Involvement Pertinent to Current Situation/Hospitalization: No - Comment as needed  Activities of Daily Living Home Assistive Devices/Equipment: Oxygen ADL Screening (condition at time of admission) Patient's cognitive ability adequate to safely complete daily activities?: Yes Is the patient deaf or have difficulty hearing?: No Does the patient have difficulty seeing, even when wearing glasses/contacts?: No Does the patient have difficulty concentrating, remembering, or making decisions?: No Patient able to express need for assistance with ADLs?: Yes Does the patient have difficulty dressing or bathing?: No Independently performs ADLs?: Yes (appropriate for developmental age) Does the patient have  difficulty walking or climbing stairs?: No Weakness of Legs: None Weakness of Arms/Hands: None  Permission Sought/Granted                  Emotional Assessment Appearance:: Appears stated age Attitude/Demeanor/Rapport: Engaged Affect (typically observed): Pleasant Orientation: : Oriented to Self, Oriented to Place, Oriented to  Time, Oriented to Situation Alcohol / Substance Use: Not Applicable Psych Involvement: No (comment)  Admission diagnosis:  COPD exacerbation (HCC) [J44.1] Acute on chronic respiratory failure with hypoxia ([place]) [J96.21] Patient Active Problem List   Diagnosis Date Noted  . Acute hypoxemic respiratory failure ([place]) 07/09/2019   . Anemia   . Acute on chronic respiratory failure ([place]) 07/08/2019  . Acute respiratory failure with hypoxia ([place]) 05/19/2019  . Iron deficiency anemia 05/19/2019  . Leukocytosis 05/19/2019  . D-dimer, elevated 05/19/2019  . Dextrocardia   . Chewing tobacco nicotine dependence   . Constipation 07/19/2018  . 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 Chronic respiratory failure with hypoxia ([place]) 07/16/2018  . HTN (hypertension) 07/16/2018  . Acute on chronic respiratory failure with hypoxia ([place]) 04/21/2018  . COPD (chronic obstructive pulmonary disease) ([place]) 04/21/2018  . SVT (supraventricular tachycardia) ([place]) 04/14/2018  . Paroxysmal SVT (supraventricular tachycardia) ([place]) 04/14/2018  . Chronic diastolic CHF (congestive heart failure) ([place]) 04/14/2018  . CHF exacerbation ([place]) 04/10/2018  . 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 Restrictive lung disease 11/08/2016  . 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 Moderate persistent asthma, uncomplicated 0000000  . Abdominal pain 09/23/2014  . Gastritis, Helicobacter pylori [id number]  . Insulin-requiring or dependent type II diabetes mellitus ([place]) 01/03/2014  . 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 GERD (gastroesophageal reflux disease) 10/18/2012  . Nausea 10/18/2012  . Iron deficiency anemia due to chronic blood loss 10/18/2012  . HIP PAIN 08/31/2010  . SPINAL STENOSIS 06/28/2010  . TENDINITIS, CALCIFIC, SHOULDER, RIGHT 06/14/2010  . IMPINGEMENT SYNDROME 06/14/2010   PCP:  [name], MD Pharmacy:   [facility], [place] [address] Phone: [phone] Fax: [phone]     Social Determinants of Health (SDOH) Interventions    Readmission Risk Interventions No flowsheet data found.

## 2019-07-10 NOTE — Progress Notes (Signed)
PROGRESS NOTE    Brett [name]  [id number] DOB: [birth year] DOA: 07/08/2019 PCP: [name], MD   Brief Narrative:  Per HPI: Brett [name] is a 68 y.o. 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  Patient presented to the ED with complaints of 1 week of worsening cough, progressive difficulty breathing.  Reports that his O2 sats have been dropping so he has increased his oxygen from 3 L to 4 L.  He reports difficulty expectorating sputum, but has had worsening cough productive of cream-colored sputum, unrelieved even with double dose Mucinex. He denies fever or chills, no body aches, no sore throat, no contacts with known Covid positive patients.  Recent hospitalization 9/6 - 9/8 acute on chronic respiratory failure, secondary to COPD and decompensated CHF.  CTA negative for PE.  Patient required IV diuresis with Lasix, steroids, doxycycline.   10/27: Patient has been admitted with acute COPD exacerbation along with acute anemia.  He is noted to have dark stools, but does take iron supplementation and has history of iron deficiency.  Iron panel shows ongoing deficiency for which we will administer Feraheme today.  Diet may be started per GI recommendations.  Patient does see Dr. Luan Pulling as his pulmonologist.  Assessment & Plan:   Active Problems:   Acute on chronic respiratory failure (HCC)   Acute hypoxemic respiratory failure (HCC)   Anemia   Acute on chronic hypoxemic respiratory failure in the setting of COPD exacerbation -Patient has history of Kartagener syndrome and situs inversus -Wears 3 L nasal cannula oxygen at home. -Follows with Dr. Luan Pulling in the outpatient setting, will consider consultation as needed -Continue antibiotics, but will transition to oral regimen. -Continue duo nebs and mucolytic's -Continue IV Solu-Medrol 60 mg twice daily -Start flutter valve and  Pulmicort. -For upper airway congestion will use loratadine and Flonase. -Chest PT -Covid testing negative  Acute anemia -stable -Iron studies with significant deficiency and status post IV Feraheme -Occult stool pending -Hemoglobin on repeat CBC has remained stable -Continue to hold hold home aspirin -Appreciate GI evaluation with plans for possible endoscopy once respiratory situation improves -Continue PPI.  Diastolic CHF-stable -Last echocardiogram 03/2018 EF 60-65% with indeterminate diastolic function -Continue home Lasix as scheduled -Compensated -Continue to check daily weights and low-sodium diet.  Hypertension -stable -Continue home losartan and metoprolol  Depression -Continue Cymbalta -No suicidal ideation or hallucinations.  Diabetes mellitus -Continue SSI and hold home medications for now -Continue Levemir -Resume gabapentin for diabetic neuropathy -Suspecting increasing his CBGs in the setting of his steroids usage.   DVT prophylaxis: SCDs Code Status: Full Family Communication: None at bedside Disposition Plan: Appreciate GI evaluation, continue ongoing treatments for COPD exacerbation.  Follow clinical response.  Hopefully discharge home in the next 24-48 hours.   Consultants:   GI  Procedures:   None  Antimicrobials:  Anti-infectives (From admission, onward)   Start     Dose/Rate Route Frequency Ordered Stop   07/10/19 1000  doxycycline (VIBRA-TABS) tablet 100 mg     100 mg Oral Every 12 hours 07/10/19 0858 07/15/19 0959   07/08/19 2330  azithromycin (ZITHROMAX) 500 mg in sodium chloride 0.9 % 250 mL IVPB  Status:  Discontinued     500 mg 250 mL/hr over 60 Minutes Intravenous Every 24 hours 07/08/19 2319 07/10/19 0858      Subjective: Afebrile, no chest pain, no nausea, no vomiting.  Reports no signs of  acute bleeding.  Still short of breath with activity requiring a slightly higher level oxygen supplementation than at baseline.   Objective: Vitals:   07/10/19 0459 07/10/19 0828 07/10/19 0836 07/10/19 1517  BP: (!) 127/55     Pulse: 62     Resp: (!) 22     Temp: 98 F (36.7 C)     TempSrc: Oral     SpO2: 99% 99% 99% 98%  Weight:      Height:        Intake/Output Summary (Last 24 hours) at 07/10/2019 1540 Last data filed at 07/10/2019 1100 Gross per 24 hour  Intake 500 ml  Output 3600 ml  Net -3100 ml   Filed Weights   07/08/19 1317 07/09/19 1532  Weight: 105.2 kg 98.2 kg    Examination: General exam: Alert, awake, oriented x 3; no chest pain, no nausea, no vomiting.  Reports mild difficulty speaking in full sentences. Respiratory system: Diffuse expiratory wheezing, no using accessory muscles, positive rhonchi.   Cardiovascular system:RRR. No murmurs, rubs, gallops. Gastrointestinal system: Abdomen is nondistended, soft and nontender. No organomegaly or masses felt. Normal bowel sounds heard. Central nervous system: Alert and oriented. No focal neurological deficits. Extremities: No cyanosis or clubbing. Skin: No rashes, lesions or ulcers Psychiatry: Judgement and insight appear normal. Mood & affect appropriate.     Data Reviewed: I have personally reviewed following labs and imaging studies  CBC: Recent Labs  Lab 07/08/19 1518 07/09/19 0458 07/10/19 0606  WBC 10.4 13.5* 17.5*  NEUTROABS 7.9*  --   --   HGB 8.3* 8.6* 7.8*  HCT 27.2* 28.2* 25.7*  MCV 95.1 95.6 94.1  PLT 375 407* [id number]   Basic Metabolic Panel: Recent Labs  Lab 07/08/19 1518 07/10/19 0606  NA 135 131*  K 4.7 5.3*  CL 95* 90*  CO2 28 31  GLUCOSE 78 218*  BUN 16 18  CREATININE 0.80 0.78  CALCIUM 9.1 9.2   GFR: Estimated Creatinine Clearance: 103.2 mL/min (by C-G formula based on SCr of 0.78 mg/dL).  CBG: Recent Labs  Lab 07/09/19 1248 07/09/19 1619 07/09/19 2140 07/10/19 0803 07/10/19 1156  GLUCAP 178* 211* 193* 179* 176*   Anemia Panel: Recent Labs    07/08/19 1518  FERRITIN 182  TIBC 449  IRON  30*   Sepsis Labs: No results for input(s): PROCALCITON, LATICACIDVEN in the last 168 hours.  Recent Results (from the past 240 hour(s))  SARS CORONAVIRUS 2 (TAT 6-24 HRS) Nasopharyngeal Nasopharyngeal Swab     Status: None   Collection Time: 07/08/19  4:41 PM   Specimen: Nasopharyngeal Swab  Result Value Ref Range Status   SARS Coronavirus 2 NEGATIVE NEGATIVE Final    Comment: (NOTE) SARS-CoV-2 target nucleic acids are NOT DETECTED. The SARS-CoV-2 RNA is generally detectable in upper and lower respiratory specimens during the acute phase of infection. Negative results do not preclude SARS-CoV-2 infection, do not rule out co-infections with other pathogens, and should not be used as the sole basis for treatment or other patient management decisions. Negative results must be combined with clinical observations, patient history, and epidemiological information. The expected result is Negative. Fact Sheet for Patients: SugarRoll.be Fact Sheet for Healthcare Providers: https://www.woods-mathews.com/ This test is not yet approved or cleared by the [place] FDA and  has been authorized for detection and/or diagnosis of SARS-CoV-2 by FDA under an Emergency Use Authorization (EUA). This EUA will remain  in effect (meaning this test can be used) for the duration of  the COVID-19 declaration under Section 56 4(b)(1) of the Act, 21 U.S.C. section 360bbb-3(b)(1), unless the authorization is terminated or revoked sooner. Performed at [facility], [place] [address], [place],  [address]       Radiology Studies: No results found.   Scheduled Meds: . budesonide (PULMICORT) nebulizer solution  0.5 mg Nebulization BID  . doxycycline  100 mg Oral Q12H  . DULoxetine  60 mg Oral Daily  . fenofibrate  54 mg Oral Daily  . fluticasone  2 spray Each Nare Daily  . furosemide  40 mg Oral q1800  . furosemide  60 mg Oral Q breakfast  .  gabapentin  1,200 mg Oral TID  . insulin aspart  0-15 Units Subcutaneous TID WC  . insulin aspart  0-5 Units Subcutaneous QHS  . insulin detemir  20 Units Subcutaneous QHS  . ipratropium-albuterol  3 mL Nebulization QID  . loratadine  10 mg Oral Daily  . losartan  100 mg Oral Daily  . methylPREDNISolone (SOLU-MEDROL) injection  60 mg Intravenous Q12H  . metoprolol tartrate  25 mg Oral BID  . pantoprazole (PROTONIX) IV  40 mg Intravenous Q24H  . rOPINIRole  2 mg Oral QHS  . rosuvastatin  40 mg Oral QPM  . traZODone  150 mg Oral QHS   Continuous Infusions:    LOS: 1 day    Time spent: 30 minutes    Barton Dubois, MD [facility] Pager [phone]   07/10/2019, 3:40 PM

## 2019-07-10 NOTE — Progress Notes (Signed)
Subjective: Feels his breathing is better. Has a MR sister at home, wants to go home. Prefers outpatient endoscopic evaluation. Still with some shortness of breath. Has a bowel movement today which was brown without hematochezia or melena. No other GI complaints.  Objective: Vital signs in last 24 hours: Temp:  [98 F (36.7 C)-98.1 F (36.7 C)] 98 F (36.7 C) (10/28 0459) Pulse Rate:  [62-73] 62 (10/28 0459) Resp:  [12-22] 22 (10/28 0459) BP: (127-141)/(55-57) 127/55 (10/28 0459) SpO2:  [95 %-99 %] 99 % (10/28 0836) Weight:  [98.2 kg] 98.2 kg (10/27 1532) Last BM Date: 07/08/19 General:   Alert and oriented, pleasant. Head:  Normocephalic and atraumatic. Eyes:  No icterus, sclera clear. Conjuctiva pink.  Heart:  S1, S2 present, no murmurs noted.  Lungs: Clear to auscultation bilaterally, without wheezing, rales, or rhonchi. Appears short of breath ambulating across the room. On HFNC 6 lpm currently Abdomen:  Bowel sounds present, soft, non-tender, non-distended. No HSM or hernias noted. No rebound or guarding. No masses appreciated  Msk:  Symmetrical without gross deformities. Extremities:  Without clubbing or edema. Neurologic:  Alert and  oriented x4;  grossly normal neurologically. Skin:  Warm and dry, intact without significant lesions.  Psych:  Alert and cooperative. Normal mood and affect.  Intake/Output from previous day: 10/27 0701 - 10/28 0700 In: 1180 [P.O.:680; IV Piggyback:500] Out: 4000 [Urine:4000] Intake/Output this shift: No intake/output data recorded.  Lab Results: Recent Labs    07/08/19 1518 07/09/19 0458 07/10/19 0606  WBC 10.4 13.5* 17.5*  HGB 8.3* 8.6* 7.8*  HCT 27.2* 28.2* 25.7*  PLT 375 407* 386   BMET Recent Labs    07/08/19 1518 07/10/19 0606  NA 135 131*  K 4.7 5.3*  CL 95* 90*  CO2 28 31  GLUCOSE 78 218*  BUN 16 18  CREATININE 0.80 0.78  CALCIUM 9.1 9.2   LFT No results for input(s): PROT, ALBUMIN, AST, ALT, ALKPHOS,  BILITOT, BILIDIR, IBILI in the last 72 hours. PT/INR No results for input(s): LABPROT, INR in the last 72 hours. Hepatitis Panel No results for input(s): HEPBSAG, HCVAB, HEPAIGM, HEPBIGM in the last 72 hours.   Studies/Results: Dg Chest 2 View  Result Date: 07/08/2019 CLINICAL DATA:  Shortness of breath. EXAM: CHEST - 2 VIEW COMPARISON:  May 20, 2019. 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 Stable cardiomediastinal silhouette in terms of size. No pneumothorax or pleural effusion is noted. No acute pulmonary disease is noted. Bony thorax is unremarkable. IMPRESSION: No active cardiopulmonary disease. Electronically Signed   By: Marijo Conception [name]   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  Patient's O2 sats dropped to 79-83% on 4 L nasal cannula and was transitioned to high flow nasal cannula.  He improved to 5 L yesterday but downgraded back to HFNC 6 L early this morning.    Anemia: Labs revealed hemoglobin of 8.3 on 10/26 which is down from 12.5 two months ago.  Hemoglobin was slightly improved to 8.6 yesterday.  Iron studies with ferritin normal, iron slightly low at 30, percent saturation low at 7%. Patient is without any overt GI bleeding. Reported dark stools, but not black and  he is on oral iron.  Denieed abdominal pain, nausea, vomiting, or GERD symptoms on omeprazole daily.  No unintentional weight loss.  No NSAIDs.  He has been on oral iron outpatient.  Last Feraheme infusion was 04/13/2018. Today hgb declined to 7.8. Denies hematochezia or melena in the past 24 hours. Hospitalist plan for IV Feraheme.  Doubt  significant upper GI source for worsening anemia as patient is essentially asymptomatic from an upper GI standpoint, no regular NSAID use, and has been on omeprazole daily. He is on protonix 40 mg daily as inpatient. He is overdue to TCS to evaluate for history of polyps. Regardless, he is not stable to undergo procedures at this time due to his respiratory status.  Will continue to monitor symptoms and H&H. Consider endoscopic evaluation when respiratory status improves. The patient seems to repeatedly express preference to outpatient endoscopy due to home/family needs.  Constipation: Uses laxatives daily. Otherwise with hard BMs every 3-4 days. Add MiraLAX x 1 today then prn. Had a bowel movement today.  Plan: 1. Continue MiraLAX prn 2. Monitor H/H 3. Transfuse as necessary 4. Will discuss endoscopy eval with Dr. Oneida Alar inpatient vs outpation due to patient desires 5. Supportive measures   Thank you for allowing [place] to participate in the care of [name]  Walden Field, DNP, AGNP-C Adult & Gerontological Nurse Practitioner [facility]     LOS: 1 day    07/10/2019, 8:50 AM

## 2019-07-11 ENCOUNTER — Telehealth: Payer: Self-pay | Admitting: Gastroenterology

## 2019-07-11 DIAGNOSIS — E6609 Other obesity due to excess calories: Secondary | ICD-10-CM

## 2019-07-11 DIAGNOSIS — K219 Gastro-esophageal reflux disease without esophagitis: Secondary | ICD-10-CM

## 2019-07-11 DIAGNOSIS — Z6832 Body mass index (BMI) 32.0-32.9, adult: Secondary | ICD-10-CM

## 2019-07-11 LAB — BASIC METABOLIC PANEL
Anion gap: 13 (ref 5–15)
BUN: 19 mg/dL (ref 8–23)
CO2: 29 mmol/L (ref 22–32)
Calcium: 10.1 mg/dL (ref 8.9–10.3)
Chloride: 92 mmol/L — ABNORMAL LOW (ref 98–111)
Creatinine, Ser: 0.76 mg/dL (ref 0.61–1.24)
GFR calc Af Amer: >60 mL/min (ref >60)
GFR calc non Af Amer: >60 mL/min (ref >60)
Glucose, Bld: 144 mg/dL — ABNORMAL HIGH (ref 70–99)
Potassium: 4.5 mmol/L (ref 3.5–5.1)
Sodium: 134 mmol/L — ABNORMAL LOW (ref 135–145)

## 2019-07-11 LAB — CBC
HCT: 28.6 % — ABNORMAL LOW (ref 39.0–52.0)
Hemoglobin: 8.8 g/dL — ABNORMAL LOW (ref 13.0–17.0)
MCH: 28.9 pg (ref 26.0–34.0)
MCHC: 30.8 g/dL (ref 30.0–36.0)
MCV: 94.1 fL (ref 80.0–100.0)
Platelets: 490 10*3/uL — ABNORMAL HIGH (ref 150–400)
RBC: 3.04 MIL/uL — ABNORMAL LOW (ref 4.22–5.81)
RDW: 14.8 % (ref 11.5–15.5)
WBC: 21 10*3/uL — ABNORMAL HIGH (ref 4.0–10.5)
nRBC: 0 % (ref 0.0–0.2)

## 2019-07-11 LAB — GLUCOSE, CAPILLARY
Glucose-Capillary: 157 mg/dL — ABNORMAL HIGH (ref 70–99)
Glucose-Capillary: 172 mg/dL — ABNORMAL HIGH (ref 70–99)

## 2019-07-11 MED ORDER — PANTOPRAZOLE SODIUM 40 MG PO TBEC: 40.0000 mg | DELAYED_RELEASE_TABLET | Freq: Every day | ORAL | 1 refills | Status: DC

## 2019-07-11 MED ORDER — PREDNISONE 20 MG PO TABS: ORAL_TABLET | ORAL | 0 refills | Status: DC

## 2019-07-11 MED ORDER — FLUTICASONE PROPIONATE 50 MCG/ACT NA SUSP: 2.0000 | Freq: Every day | NASAL | 2 refills | Status: DC

## 2019-07-11 MED ORDER — PANTOPRAZOLE SODIUM 40 MG PO TBEC
40.0000 mg | DELAYED_RELEASE_TABLET | Freq: Every day | ORAL | Status: DC
  Administered 2019-07-11: 12:00:00 40 mg via ORAL
  Filled 2019-07-11: qty 1

## 2019-07-11 MED ORDER — LORATADINE 10 MG PO TABS: 10.0000 mg | ORAL_TABLET | Freq: Every day | ORAL | 1 refills | Status: DC

## 2019-07-11 MED ORDER — FUROSEMIDE 40 MG PO TABS: 40.0000 mg | ORAL_TABLET | ORAL | Status: DC

## 2019-07-11 MED ORDER — POLYSACCHARIDE IRON COMPLEX 150 MG PO CAPS: 150.0000 mg | ORAL_CAPSULE | Freq: Every day | ORAL | 1 refills | Status: DC

## 2019-07-11 MED ORDER — DOXYCYCLINE HYCLATE 100 MG PO TABS: 100.0000 mg | ORAL_TABLET | Freq: Two times a day (BID) | ORAL | 0 refills | Status: AC

## 2019-07-11 NOTE — Telephone Encounter (Signed)
Patient needs hospital follow for IDA in 2-3 weeks with EG, [place], or [facility].

## 2019-07-11 NOTE — Discharge Summary (Signed)
Physician Discharge Summary  Brett [name] [id number] DOB: 08-25-1951 DOA: 07/08/2019  PCP: [name], MD  Admit date: 07/08/2019 Discharge date: 07/11/2019  Time spent: 35 minutes  Recommendations for Outpatient Follow-up:  1. Repeat basic metabolic panel to follow electrolytes and renal function 2. Reassess blood pressure and blood sugar level with further adjustment to hypoglycemic regimen and antihypertensive medication as required.    Discharge Diagnoses:  Active Problems:   COPD exacerbation (HCC)   Acute on chronic respiratory failure (HCC)   Acute hypoxemic respiratory failure (HCC)   Anemia   Class 1 obesity due to excess calories with body mass index (BMI) of 32.0 to 32.9 in adult   Discharge Condition: Stable and improved.  Patient discharged home with instruction to follow-up with PCP, gastroenterology and pulmonology service after discharge.   Diet recommendation: Heart healthy, modified carbohydrates and low calorie diet.  Filed Weights   07/08/19 1317 07/09/19 1532  Weight: 105.2 kg 98.2 kg    History of present illness:  Brett [name] a 68 y.o.malewith medical history significant forCOPDwithchronic respiratory failure on 3 L O2, Kartagener's syndrome, diastolic CHF, depression. Patient presented to the ED with complaints of 1 week of worsening cough, progressive difficulty breathing. Reports that his O2 sats have been dropping so he has increased his oxygen from 3 L to 4 L. He reports difficulty expectorating sputum, but has hadworsening cough productive of cream-colored sputum, unrelievedeven withdouble doseMucinex. He denies fever or chills, no body aches, no sore throat, no contacts withknownCovid positive patients.  Recent hospitalization9/6 - 9/8acute on chronic respiratory failure, secondary to COPD and decompensated CHF.CTA negative for PE.Patient required IV diuresis with Lasix, steroids, doxycycline.  Hospital Course:   Acute on chronic hypoxemic respiratory failure in the setting of COPD exacerbation -Patient has history of Kartagener syndrome and situs inversus -Wears 3 L nasal cannula oxygen at home. -Follows with Dr. Luan Pulling in the outpatient setting, will consider consultation as needed -Continue oral antibiotics to complete course -Resume home inhaler/nebulizer regimen; patient will complete steroids tapering. -Speaking in full sentences, with good oxygen saturation on chronic supplementation. -For upper airway congestion will use loratadine and Flonase. -Covid testing negative  Class I obesity -Body mass index is 32.92 kg/m. -Calorie diet, portion control and increase physical activity discussed with patient.  Acute anemia -stable hemoglobin at discharge. -Iron studies with significant deficiency; status post IV Feraheme -No bright red blood per rectum or melanotic stools appreciated. -Patient discharge will need Ferrex daily -Outpatient follow-up with gastroenterology service for further endoscopy/colonoscopy evaluation. -Hemoglobin on repeat CBC has remained stable -Continue PPI.  Diastolic CHF -Last echocardiogram 03/2018 EF 60-65% with indeterminate diastolic function -Continue home Lasix as scheduled -Chronic and compensated -Continue to check daily weights and follow low-sodium diet.  Hypertension -stable -Continue home losartan and metoprolol -Advised to follow heart healthy diet  Depression -Continue Cymbalta -No suicidal ideation or hallucinations.  Diabetes mellitus with neuropathy -Resume home hypoglycemic regimen and closely follow CBGs and A1c as an outpatient. -Resume gabapentin for diabetic neuropathy -SPECT increased CBGs in the setting of his steroids usage.  Procedures:  See below for x-ray reports.  Consultations:  Gastroenterology service  Discharge Exam: Vitals:   07/11/19 0812 07/11/19 0818  BP:    Pulse:    Resp:    Temp:    SpO2: 97% 100%     General: Alert, awake and oriented x3; no chest pain, no nausea, no vomiting.  Patient reports significant improvement in his breathing and feeling back  to baseline.  Speaking in full sentences and with good oxygen saturation on chronic supplementation. Cardiovascular: RRR, no murmurs, no rubs, no gallops; no JVD. Respiratory: Improved air movement bilaterally, no expiratory wheezing appreciated currently; positive rhonchi, no using accessory muscles.  Normal respiratory effort. Abdomen: Soft, nontender, nondistended, positive bowel sounds Extremities: No cyanosis, no clubbing.  Discharge Instructions   Discharge Instructions    (HEART FAILURE PATIENTS) Call MD:  Anytime you have any of the following symptoms: 1) 3 pound weight gain in 24 hours or 5 pounds in 1 week 2) shortness of breath, with or without a dry hacking cough 3) swelling in the hands, feet or stomach 4) if you have to sleep on extra pillows at night in order to breathe.   Complete by: As directed    Diet - low sodium heart healthy   Complete by: As directed    Discharge instructions   Complete by: As directed    The medications are prescribed Arrange follow-up with PCP in 10 days Follow-up with pulmonology service in 2 weeks Use oxygen supplementation as discussed continue to use flutter valve as instructed.. Follow low-sodium diet, check your weight on daily basis and maintain adequate hydration.     Allergies as of 07/11/2019   No Known Allergies     Medication List    TAKE these medications   albuterol 108 (90 Base) MCG/ACT inhaler Commonly known as: ProAir HFA Inhale 2 puffs into the lungs every 4 (four) hours as needed for wheezing or shortness of breath.   aspirin EC 81 MG tablet Take 81 mg by mouth daily.   budesonide-formoterol 160-4.5 MCG/ACT inhaler Commonly known as: Symbicort Inhale 2 puffs into the lungs 2 (two) times daily.   Cinnamon 500 MG capsule Take 2,000 mg by mouth daily.    doxycycline 100 MG tablet Commonly known as: VIBRA-TABS Take 1 tablet (100 mg total) by mouth every 12 (twelve) hours for 4 days.   DULoxetine 60 MG capsule Commonly known as: CYMBALTA Take 60 mg by mouth daily.   fenofibrate 145 MG tablet Commonly known as: TRICOR Take 145 mg by mouth every evening.   ferrous sulfate 325 (65 FE) MG tablet Take 325-650 mg by mouth See admin instructions. Take two tablets on Monday Wednesday, Friday. And 1 tablet on all other days   fluticasone 50 MCG/ACT nasal spray Commonly known as: FLONASE Place 2 sprays into both nostrils daily. Start taking on: October 30, [id number]   folic acid 1 MG tablet Commonly known as: FOLVITE Take 1 mg by mouth daily.   furosemide 40 MG tablet Commonly known as: Lasix Take 1-1.5 tablets (40-60 mg total) by mouth See admin instructions. 60mg  in the morning and 40mg  in the afternoon   gabapentin 600 MG tablet Commonly known as: NEURONTIN Take 1,200 mg by mouth 3 (three) times daily.   insulin detemir 100 UNIT/ML injection Commonly known as: LEVEMIR Inject 60 Units into the skin at bedtime.   iron polysaccharides 150 MG capsule Commonly known as: NIFEREX Take 1 capsule (150 mg total) by mouth daily.   loratadine 10 MG tablet Commonly known as: CLARITIN Take 1 tablet (10 mg total) by mouth daily. Start taking on: July 12, 2019   losartan 100 MG tablet Commonly known as: COZAAR Take 100 mg by mouth daily.   metFORMIN 1000 MG tablet Commonly known as: GLUCOPHAGE Take 1,000 mg by mouth 2 (two) times daily with a meal.   metoprolol tartrate 25 MG tablet Commonly known as:  LOPRESSOR Take 1 tablet (25 mg total) by mouth 2 (two) times daily.   omeprazole 20 MG capsule Commonly known as: PRILOSEC Take 20 mg by mouth daily.   pantoprazole 40 MG tablet Commonly known as: PROTONIX Take 1 tablet (40 mg total) by mouth daily. Start taking on: July 12, 2019   predniSONE 20 MG tablet Commonly known as:  DELTASONE Take 3 tablets by mouth daily x2 days; then 2 tablets by mouth daily x2 days; then 1 tablet by mouth daily x3 days; then half tablet by mouth daily x3 days and stop prednisone. What changed: additional instructions   rOPINIRole 2 MG tablet Commonly known as: REQUIP Take 2 mg by mouth at bedtime.   rosuvastatin 40 MG tablet Commonly known as: CRESTOR Take 40 mg by mouth every evening.   sitaGLIPtin 50 MG tablet Commonly known as: JANUVIA Take 50 mg by mouth daily.   traMADol 50 MG tablet Commonly known as: ULTRAM Take 50-100 mg by mouth every 6 (six) hours as needed for moderate pain.   traZODone 50 MG tablet Commonly known as: DESYREL Take 150 mg by mouth at bedtime.      No Known Allergies Follow-up Information    [name], MD. Schedule an appointment as soon as possible for a visit in 10 day(s).   Specialty: Internal Medicine Contact information: [address] [place] [address] [phone]        Arnoldo Lenis, MD .   Specialty: Cardiology Contact information: [address] [place] [address] [phone]        Sinda Du, MD. Schedule an appointment as soon as possible for a visit in 2 week(s).   Specialty: Pulmonary Disease Contact information: [address] [place] [address] [phone]           The results of significant diagnostics from this hospitalization (including imaging, microbiology, ancillary and laboratory) are listed below for reference.    Significant Diagnostic Studies: Dg Chest 2 View  Result Date: 07/08/2019 CLINICAL DATA:  Shortness of breath. EXAM: CHEST - 2 VIEW COMPARISON:  May 20, 2019. 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 Stable cardiomediastinal silhouette in terms of size. No pneumothorax or pleural effusion is noted. No acute pulmonary disease is noted. Bony thorax is unremarkable. IMPRESSION: No active cardiopulmonary disease. Electronically Signed    By: Marijo Conception [name]   On: 07/08/2019 13:50    Microbiology: Recent Results (from the past 240 hour(s))  SARS CORONAVIRUS 2 (TAT 6-24 HRS) Nasopharyngeal Nasopharyngeal Swab     Status: None   Collection Time: 07/08/19  4:41 PM   Specimen: Nasopharyngeal Swab  Result Value Ref Range Status   SARS Coronavirus 2 NEGATIVE NEGATIVE Final    Comment: (NOTE) SARS-CoV-2 target nucleic acids are NOT DETECTED. The SARS-CoV-2 RNA is generally detectable in upper and lower respiratory specimens during the acute phase of infection. Negative results do not preclude SARS-CoV-2 infection, do not rule out co-infections with other pathogens, and should not be used as the sole basis for treatment or other patient management decisions. Negative results must be combined with clinical observations, patient history, and epidemiological information. The expected result is Negative. Fact Sheet for Patients: SugarRoll.be Fact Sheet for Healthcare Providers: https://www.woods-mathews.com/ This test is not yet approved or cleared by the [place] FDA and  has been authorized for detection and/or diagnosis of SARS-CoV-2 by FDA under an Emergency Use Authorization (EUA). This EUA will remain  in effect (meaning this test  can be used) for the duration of the COVID-19 declaration under Section 56 4(b)(1) of the Act, 21 U.S.C. section 360bbb-3(b)(1), unless the authorization is terminated or revoked sooner. Performed at [facility], [address] [place], [place] [address]      Labs: Basic Metabolic Panel: Recent Labs  Lab 07/08/19 1518 07/10/19 0606 07/11/19 0833  NA 135 131* 134*  K 4.7 5.3* 4.5  CL 95* 90* 92*  CO2 28 31 29   GLUCOSE 78 218* 144*  BUN 16 18 19   CREATININE 0.80 0.78 0.76  CALCIUM 9.1 9.2 10.1   CBC: Recent Labs  Lab 07/08/19 1518 07/09/19 0458 07/10/19 0606 07/11/19 0833  WBC 10.4 13.5* 17.5* 21.0*  NEUTROABS 7.9*   --   --   --   HGB 8.3* 8.6* 7.8* 8.8*  HCT 27.2* 28.2* 25.7* 28.6*  MCV 95.1 95.6 94.1 94.1  PLT 375 407* 386 490*   BNP (last 3 results) Recent Labs    07/16/18 1807 05/19/19 1116 07/08/19 1518  BNP 393.0* 553.0* 394.0*    CBG: Recent Labs  Lab 07/10/19 1156 07/10/19 1631 07/10/19 2107 07/11/19 0756 07/11/19 1140  GLUCAP 176* 195* 183* 157* 172*    Signed:  Barton Dubois MD.  [facility] 07/11/2019, 12:25 PM

## 2019-07-12 ENCOUNTER — Encounter: Payer: Self-pay | Admitting: Internal Medicine

## 2019-07-12 NOTE — Telephone Encounter (Signed)
PATIENT SCHEDULED AND LETTER SENT  °

## 2019-07-30 NOTE — Progress Notes (Signed)
Referring Provider: [name], MD Primary Care Physician:  [name], MD Primary GI Physician: Dr. Gala Romney  Chief Complaint  Patient presents with  . Hospitalization Follow-up  . Nausea    occ, no vomiting    HPI:   Brett [name] is a 68 y.o. 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  Patient was admitted on 07/08/2019 with COPD exacerbation and acute on chronic respiratory failure.  He was noted to have drop in hemoglobin to 8.3 on 10/26 which was down from 12.5 two months prior.  He was without overt GI bleeding.  Denied any significant upper GI symptoms on omeprazole daily.  Lower GI symptoms of constipation.  He received Feraheme while inpatient.  Hemoglobin remained stable while inpatient.  Patient desired outpatient endoscopic evaluation due to family/home needs. On discharge, hemoglobin was 8.8.   Prior GI Evaluation: EGD on 11/09/2012 with normal esophagus, reverse stomach consistent with situs inversus, small tiny antral erosions and gastric erythema involving primarily the antrum, no ulcer or infiltrating process.  Small hiatal hernia.  Pathology with chronic active gastritis with H. Pylori (s/p treatment).  No dysplasia or malignancy.   Colonoscopy on 11/01/2012 with 2 polyps, otherwise remainder of the colonic mucosa appeared normal, terminal ileal mucosa appeared normal.  Pathology with tubular adenomas.  Recommended repeat colonoscopy in 5 years. Givens capsule study completed in January 2015 with no source of anemia or heme positive stool.  Today he states he is still having trouble breathing. Also trouble with his sinuses. Went to [facility] clinic yesterday for follow-up. They placed him on  Augmentin. States he also had blood work and they were concerned about his white count. Reports he saw Dr. Luan Pulling a couple days ago who told him he checked out fine. Overall, he feels he has improved since his hospitalization. States, "I have come a long way since then." Feels once he is over his sinus trouble, he will be able to breathe better. Continues to wear 2-3 L O2 continuously.   Denies brbpr or melena. Taking stool softeners with laxative daily. Has had trouble with constipation since his 41s. Usually will have a good BM daily. Other times will have small stools. Stools are soft and formed. No diarrhea. No abdominal pain. Occasional nausea. Less than once a week typically. May go 1 month without nausea. States this has been present for a while, not sure how long. No vomiting. No GERD symptoms with acid reflux medication. Not sure if he is taking omeprazole or Protonix. No dysphagia. Has lost about 20 lbs since January 2020. Weight fairly stable since August. No NSAIDs.  No fever or chills. Occasional lightheadedness when bending down and standing up. No pre-syncope or syncope. No chest pain or heart palpitations.    Patient is not aware of the medications he is taking. States his wife takes care of all his medications. He will have her call us tomorrow to update his med list.    Past Medical History:  Diagnosis Date  . Chronic diastolic heart failure ([place])   . COPD (chronic obstructive pulmonary disease) ([place])   . Depression   . Dextrocardia   . Diabetes mellitus   . GERD (gastroesophageal reflux disease)   . H. pylori infection 11/09/2012   treated with  pylera  . HLD (hyperlipidemia)   . HTN (hypertension)    Cholesterol  . Iron deficiency anemia, unspecified 10/18/2012  . Neuropathy   . Pneumonia   . Situs inversus totalis   . Tachycardia     Past Surgical History:  Procedure Laterality Date  . CATARACT EXTRACTION, BILATERAL  2016  . COLONOSCOPY WITH ESOPHAGOGASTRODUODENOSCOPY  (EGD) N/A 11/09/2012   Dr. 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 hpylori on bx- treated with pylera. TCS- normal rectum. 1 diminutive polyp in the mid descending segment. 1-63mm polyp in the mid desending segment o/w the remainder of the colonic mucosa appeared normal. tubular adenoma on bx  . GIVENS CAPSULE STUDY N/A 10/07/2013   no source for anemia or heme positive stool noted  . NECK SURGERY     bone spurs    Current Outpatient Medications  Medication Sig Dispense Refill  . albuterol (PROAIR HFA) 108 (90 Base) MCG/ACT inhaler Inhale 2 puffs into the lungs every 4 (four) hours as needed for wheezing or shortness of breath. 1 Inhaler 2  . amoxicillin-clavulanate (AUGMENTIN) 875-125 MG tablet Take 1 tablet by mouth 2 (two) times daily.    Marland Kitchen aspirin EC 81 MG tablet Take 81 mg by mouth daily.    . budesonide-formoterol (SYMBICORT) 160-4.5 MCG/ACT inhaler Inhale 2 puffs into the lungs 2 (two) times daily. 1 Inhaler 5  . Cinnamon 500 MG capsule Take 2,000 mg by mouth daily.     . DULoxetine (CYMBALTA) 60 MG capsule Take 60 mg by mouth daily.    . fenofibrate (TRICOR) 145 MG tablet Take 145 mg by mouth every evening.     . fluticasone (FLONASE) 50 MCG/ACT nasal spray Place 2 sprays into both nostrils daily. 16 g 2  . folic acid (FOLVITE) 1 MG tablet Take 1 mg by mouth daily.    . furosemide (LASIX) 40 MG tablet Take 1-1.5 tablets (40-60 mg total) by mouth See admin instructions. 60mg  in the morning and 40mg  in the afternoon    . gabapentin (NEURONTIN) 600 MG tablet Take 1,200 mg by mouth 3 (three) times daily.    . insulin detemir (LEVEMIR) 100 UNIT/ML injection Inject 60 Units into the skin at bedtime.     Marland Kitchen loratadine (CLARITIN) 10 MG tablet Take 1 tablet (10 mg total) by mouth daily. 30 tablet 1  . losartan (COZAAR) 100 MG tablet Take 100 mg by mouth daily.    . metFORMIN (GLUCOPHAGE) 1000 MG tablet Take  1,000 mg by mouth 2 (two) times daily with a meal.    . metoprolol tartrate (LOPRESSOR) 25 MG tablet Take 1 tablet (25 mg total) by mouth 2 (two) times daily. 180 tablet 3  . rOPINIRole (REQUIP) 2 MG tablet Take 2 mg by mouth at bedtime.     . rosuvastatin (CRESTOR) 40 MG tablet Take 40 mg by mouth every evening.     . sitaGLIPtin (JANUVIA) 50 MG tablet Take 50 mg by mouth daily.     . traMADol (ULTRAM) 50 MG tablet Take 50-100 mg by mouth every 6 (six) hours as needed for moderate pain.     . ferrous sulfate 325 (65 FE) MG tablet Take 325-650 mg by mouth See admin instructions. Take two tablets on Monday Wednesday, Friday. And 1 tablet on all other days    . iron polysaccharides (NIFEREX) 150 MG capsule Take 1 capsule (150 mg total) by mouth daily. 30 capsule 1  . linaclotide (LINZESS)  145 MCG CAPS capsule Take 1 capsule (145 mcg total) by mouth daily before breakfast. 30 capsule 5  . omeprazole (PRILOSEC) 20 MG capsule Take 20 mg by mouth daily.    . pantoprazole (PROTONIX) 40 MG tablet Take 1 tablet (40 mg total) by mouth daily. 60 tablet 1  . predniSONE (DELTASONE) 20 MG tablet Take 3 tablets by mouth daily x2 days; then 2 tablets by mouth daily x2 days; then 1 tablet by mouth daily x3 days; then half tablet by mouth daily x3 days and stop prednisone. (Patient not taking: Reported on 07/31/2019) 15 tablet 0  . traZODone (DESYREL) 50 MG tablet Take 150 mg by mouth at bedtime.      No current facility-administered medications for this visit.     Allergies as of 07/31/2019  . (No Known Allergies)    Family History  Problem Relation Age of Onset  . Heart attack Mother   . Diabetes Sister        Fx  . Heart defect Sister        Fx  . Arthritis Sister        Fx  . Asthma Sister        Fx  . Kidney disease Sister        Fx  . Pancreatitis Sister   . Colon cancer Neg Hx     Social History   Socioeconomic History  . Marital status: Married    Spouse name: Not on file  . Number  of children: 0  . Years of education: 7th grade   . Highest education level: Not on file  Occupational History  . Occupation: Lexicographer  . Financial resource strain: Not on file  . Food insecurity    Worry: Not on file    Inability: Not on file  . Transportation needs    Medical: Not on file    Non-medical: Not on file  Tobacco Use  . Smoking status: Never Smoker  . Smokeless tobacco: Current User    Types: Chew  Substance and Sexual Activity  . Alcohol use: No  . Drug use: No  . Sexual activity: Not on file  Lifestyle  . Physical activity    Days per week: Not on file    Minutes per session: Not on file  . Stress: Not on file  Relationships  . Social Herbalist on phone: Not on file    Gets together: Not on file    Attends religious service: Not on file    Active member of club or organization: Not on file    Attends meetings of clubs or organizations: Not on file    Relationship status: Not on file  Other Topics Concern  . Not on file  Social History Narrative  . Not on file    Review of Systems: Gen: See HPI CV: See HPI Resp: See HPI GI: See HPI Derm: Denies rash Psych: Denies depression, anxiety Heme: Denies bruising, bleeding  Physical Exam: BP 136/60   Pulse 77   Temp (!) 96.2 F (35.7 C) (Temporal)   Ht 5\' 7"  (1.702 m)   Wt 228 lb 6.4 oz (103.6 kg)   BMI 35.77 kg/m  General:   Alert and oriented. No distress noted. Pleasant and cooperative. Nasal canula in place with 2L O2. Uses a cane.   Head:  Normocephalic and atraumatic. Eyes:  Conjuctiva clear without scleral icterus. Heart:  S1, S2 present without murmurs appreciated.  Lungs:  Clear to auscultation bilaterally. No wheezes, rales, or rhonchi. No distress.  Abdomen:  +BS, soft, non-tender and non-distended. No rebound or guarding. No HSM or masses noted. Msk:  Symmetrical without gross deformities. Normal posture. Extremities:  With 1+ pitting edema bilaterally.  Neurologic:  Alert and  oriented x4 Psych:  Normal mood and affect.

## 2019-07-31 ENCOUNTER — Ambulatory Visit (INDEPENDENT_AMBULATORY_CARE_PROVIDER_SITE_OTHER): Payer: Medicare HMO | Admitting: Gastroenterology

## 2019-07-31 ENCOUNTER — Encounter: Payer: Self-pay | Admitting: Gastroenterology

## 2019-07-31 ENCOUNTER — Other Ambulatory Visit: Payer: Self-pay

## 2019-07-31 VITALS — BP 136/60 | HR 77 | Temp 96.2°F | Ht 67.0 in | Wt 228.4 lb

## 2019-07-31 DIAGNOSIS — K5900 Constipation, unspecified: Secondary | ICD-10-CM | POA: Diagnosis not present

## 2019-07-31 DIAGNOSIS — D509 Iron deficiency anemia, unspecified: Secondary | ICD-10-CM

## 2019-07-31 MED ORDER — LINACLOTIDE 145 MCG PO CAPS: 145.0000 ug | ORAL_CAPSULE | Freq: Every day | ORAL | 5 refills | Status: DC

## 2019-07-31 NOTE — Assessment & Plan Note (Addendum)
Patient reports chronic constipation since his 54s.  Currently taking stool softeners with laxatives daily.  With this regimen, he typically has a BM every day. He does have history of IDA with recent decrease in hemoglobin from 12.5 in August to 8 range in October.  He denies any melena or brbpr.  He did lose about 20 pounds between January 2020 in August 2020; however, weight has been fairly stable since.  No other alarm symptoms.  Last colonoscopy in 2014 with 2 tubular adenomas.  He was due for repeat colonoscopy in 2019.  Stop daily stool softeners and laxatives and start Linzess 145 mcg.  Prescription sent to his pharmacy.  He is to call let me know if this is too expensive. He is due for colonoscopy; however, due to poor respiratory status that has not returned to baseline since recent hospitalization in October for acute on chronic respiratory failure, we will follow-up with patient in 6 weeks to reevaluate candidacy for sedation.  We will also reach out to Dr. Luan Pulling, his pulmonologist, for his input as well.

## 2019-07-31 NOTE — Patient Instructions (Addendum)
For constipation: I am sending in Linzess 145 mcg to your pharmacy. You should take 1 capsule daily on empty stomach at least 30 minutes before your first meal. When you start Linzess, please stop stool softeners and laxatives. Please call and let me know if Linzess is too expensive.  Iron deficiency anemia: We need to get you scheduled for procedures (colonoscopy and possible upper endoscopy) to evaluate your drop in hemoglobin; however, I would like to see you back in 6 weeks to see how your respiratory status is.  Hopefully we will be able to schedule you at that time. I am requesting recent labs from [facility] clinic to follow-up on your hemoglobin. If you develop any bright red blood per rectum or black stools, you should call us.  Please have your wife call us later today or tomorrow to verify your current medications.  We will see you back in 6 weeks.  Call if you have any questions or concerns prior.  Aliene Altes, PA-C [facility]

## 2019-07-31 NOTE — Assessment & Plan Note (Addendum)
68 year old male with past medical history of COPD on continuous 2-3 L of supplemental O2, chronic diastolic heart failure, diabetes, HTN, HLD, GERD, and chronic iron deficiency anemia following with oncology/hematology on oral iron.  Last iron infusion in August 2019.  He presents today for hospital follow-up.  He was admitted in October for COPD exacerbation with acute on chronic respiratory failure.  At that time it was noted he had a drop in hemoglobin to 8.3 down from 12.5 in August.  During his hospitalization his hemoglobin remained stable with hemoglobin of 8.8 at discharge.  He denies any bright red blood per rectum or melena.  GERD symptoms are well controlled on PPI therapy.  Occasional nausea without vomiting.  Also struggles with chronic constipation.  No other significant upper or lower GI symptoms. Lost about 20 lbs between January 2020 and August 2020 but weight has been fairly stable since with mild fluctuations.  Prior work-up for IDA in 2014/2015 including EGD, colonoscopy, and Givens capsule study as outlined in HPI without identified source of IDA.  He did have 2 tubular adenomas on colonoscopy and was due for repeat in 2019.   His respiratory status is improving; however, not back to baseline. States he is struggling with a sinus infection at this time which is worsening his breathing. Saw PCP yesterday who had him complete blood work and started him on Augmentin.   Patient needs colonoscopy +/- EGD for further evaluation of worsening iron deficiency anemia.  As he is without any overt GI bleeding and his respiratory status has not improved to baseline, I will plan to follow-up with patient in 6 weeks to reevaluate candidacy for sedation. Patient is unaware of what medications he is taking.  His wife is to call us tomorrow to update his medication list.  Request recent lab work from PCP. Will reach out to Dr. Luan Pulling regarding patients candidacy for sedation as well.

## 2019-08-02 ENCOUNTER — Telehealth: Payer: Self-pay | Admitting: Gastroenterology

## 2019-08-02 NOTE — Telephone Encounter (Signed)
Received recent labs from PCP dated 07/31/2019. CBC: WBC 11.3, RBC 2.95 (L), hemoglobin 9.0 (L), hematocrit 27.1 (L), MCV 91.9, MCH 30.5, MCHC 33.2, platelet count 404 (H) BMP: Sodium 134 (L), potassium 4.8, chloride 91 (L), glucose 92, BUN 11, creatinine 0.89, calcium 9.7 BNP 1567 (H)  Patient's hemoglobin is stable and slightly improved from 8.8 at the time of hospital discharge on 07/11/2019.   Spoke with Dr. Luan Pulling, patient's pulmonologist, who states when he saw patient the week of 07/29/19, patient was not a candidate for any sort of sedation.  States patients respiratory status is far from baseline. Dr. Luan Pulling will be retired by the time we see patient in the office again, and he recommends he follow-up with with [facility] pulmonology in [place] to clear him for procedure.   Elmo Putt, can you let patient know I have reviewed his labs and hemoglobin is stable/slightly improved as per above? Can you also let him know Dr. Luan Pulling recommendations? Does he already have follow-up with [facility] pulmonology? If so, when is his next appointment? If not, it would be best if he could see them in 6 weeks, ideally just before we see him so we know if he is cleared for procedures. We could potentially push out our follow-up a couple weeks if needed to allow him to see pulmonology first.    He was also supposed to have his wife to call and update his med list.

## 2019-08-05 NOTE — Telephone Encounter (Signed)
Spoke with pt. He is going to have his spouse call back to give the info needed that [facility] is requesting. Pt wasn't able to talk much due to coughing. Waiting on a return call from pt's spouse.

## 2019-08-07 ENCOUNTER — Telehealth: Payer: Self-pay

## 2019-08-07 NOTE — Telephone Encounter (Signed)
[place], medication has been updated as of today 08/07/2019 per pts spouse. Pt doesn't have an apt with [facility] pulmonology and his spouse is going to call them to set pt up an appointment . Spouse is aware our apt can be pushed out if needed since pt needs to see pulmonology first.

## 2019-08-07 NOTE — Telephone Encounter (Signed)
Message sent to [facility]. Medication has been updated per pts spouse today 08/07/2019.

## 2019-08-07 NOTE — Telephone Encounter (Signed)
Noted! Thank you

## 2019-08-21 ENCOUNTER — Ambulatory Visit: Payer: Medicare HMO | Admitting: Cardiology

## 2019-08-21 ENCOUNTER — Inpatient Hospital Stay (HOSPITAL_COMMUNITY): Payer: Medicare HMO | Attending: Hematology

## 2019-08-23 ENCOUNTER — Encounter: Payer: Self-pay | Admitting: Cardiology

## 2019-08-28 ENCOUNTER — Ambulatory Visit (HOSPITAL_COMMUNITY): Payer: Medicare HMO | Admitting: Hematology

## 2019-09-10 ENCOUNTER — Telehealth: Payer: Self-pay | Admitting: Gastroenterology

## 2019-09-10 NOTE — Telephone Encounter (Signed)
Brett [name], per last phone note, patient's wife was supposed to reach out to [facility] pulmonology to schedule an appointment in order to obtain clearance for procedures.  Was hoping patient would be able to be seen in the very near future and ideally before his follow-up with [place].  Can you call patient to follow-up on this? Has he seen them or does he have an appointment? How is his respiratory status? If he hasn't seen pulmonology and is not having any significant GI issues at this time, we could push the  office visit out a bit further for him to see pulmonology first. If he would like to be seen today, that is fine too.

## 2019-09-11 ENCOUNTER — Ambulatory Visit: Payer: Medicare HMO | Admitting: Gastroenterology

## 2019-09-11 NOTE — Telephone Encounter (Signed)
Lmom, waiting on a return call.  

## 2019-09-11 NOTE — Telephone Encounter (Signed)
Looks like he missed his last visit with oncology. Would like to recheck patients hemoglobin one more time to ensure stability. Please arrange CBC. He should also reschedule his routine follow-up with oncology.

## 2019-09-11 NOTE — Telephone Encounter (Signed)
Noted  

## 2019-09-11 NOTE — Telephone Encounter (Signed)
Spoke with pts spouse. Pt's apt is the last week of January with [place]. Pt is doing well with using his oxygen daily and no current GI symptoms per spouse. Pt was r/s to 10/20/2018 per pts request.

## 2019-09-17 ENCOUNTER — Other Ambulatory Visit: Payer: Self-pay

## 2019-09-17 DIAGNOSIS — D649 Anemia, unspecified: Secondary | ICD-10-CM

## 2019-09-17 NOTE — Telephone Encounter (Signed)
Noted  

## 2019-09-17 NOTE — Telephone Encounter (Signed)
Spoke with pt's souse. They currently have Covid-19. Pt will f/u with oncology when he's better and have labs drawn when well. Lab orders mailed to pt.

## 2019-10-04 ENCOUNTER — Ambulatory Visit: Payer: Medicare HMO | Admitting: Cardiology

## 2019-10-14 ENCOUNTER — Other Ambulatory Visit: Payer: Self-pay

## 2019-10-14 ENCOUNTER — Encounter: Payer: Self-pay | Admitting: Cardiology

## 2019-10-14 ENCOUNTER — Ambulatory Visit: Payer: Medicare HMO | Admitting: Cardiology

## 2019-10-14 VITALS — BP 130/65 | HR 66 | Temp 97.4°F | Ht 66.0 in | Wt 226.0 lb

## 2019-10-14 DIAGNOSIS — I2510 Atherosclerotic heart disease of native coronary artery without angina pectoris: Secondary | ICD-10-CM | POA: Diagnosis not present

## 2019-10-14 DIAGNOSIS — I10 Essential (primary) hypertension: Secondary | ICD-10-CM | POA: Diagnosis not present

## 2019-10-14 DIAGNOSIS — R000 Tachycardia, unspecified: Secondary | ICD-10-CM

## 2019-10-14 DIAGNOSIS — I5032 Chronic diastolic (congestive) heart failure: Secondary | ICD-10-CM

## 2019-10-14 DIAGNOSIS — E782 Mixed hyperlipidemia: Secondary | ICD-10-CM

## 2019-10-14 NOTE — Patient Instructions (Signed)

## 2019-10-14 NOTE — Progress Notes (Signed)
Clinical Summary Brett [name] is a 68 y.o.male seen today for follow up of the following medical problems.    1. Chronic diastolic HF - [id number] echo LVEF 60-65%, indeterminant diastolic function - admisssion 04/12/18 with SOB. Mutlfactorial including diastolic HF but also related to his chronic lung disease,. He was diuresed, also managed with bronchodilators and antibiotics for respiratory infection - readmitted later in 04/2018 with recurrent SOB. Managed again for COPD/bronchiectasis and diastolic HF   - - no LE edema. [place] SOB unchanged.  - weights stable around 226 lbs.    2. Elevated troponin/CAD - mild trop elevation to 0.43 in setting of HF and hypoxia, did not have typical chest pain. Thought likely demand ischemia, plans were to consider possible outpatient stress - echo normal LVEF no WMAs.  05/2018 coronary CTA with mid vessel stenosis, significant by FFR 0.76 - no recent chest pain   3. Bronchiectasis -followed by Mariel [name].    4. Hyperlipidemia - on statin, on fenofibrate from other provider. - compliant with meds   6. Tachycardia - recent palpitations,noticed by pulse ox rates 130 on and off x 3 days - resolved on its own.   7. COVID  - infection in January, has recovered    Past Medical History:  Diagnosis Date  . Chronic diastolic heart failure ([place])   . COPD (chronic obstructive pulmonary disease) ([place])   . Depression   . Dextrocardia   . Diabetes mellitus   . GERD (gastroesophageal reflux disease)   . H. pylori infection 11/09/2012   treated with pylera  . HLD (hyperlipidemia)   . HTN (hypertension)    Cholesterol  . Iron deficiency anemia, unspecified 10/18/2012  . Neuropathy   . Pneumonia   . Situs inversus totalis   . Tachycardia      No Known Allergies   Current Outpatient Medications  Medication Sig Dispense Refill  . albuterol (PROAIR HFA) 108 (90 Base) MCG/ACT inhaler Inhale 2 puffs into the  lungs every 4 (four) hours as needed for wheezing or shortness of breath. 1 Inhaler 2  . amoxicillin-clavulanate (AUGMENTIN) 875-125 MG tablet Take 1 tablet by mouth 2 (two) times daily.    Brett Kitchen aspirin EC 81 MG tablet Take 81 mg by mouth daily.    . budesonide-formoterol (SYMBICORT) 160-4.5 MCG/ACT inhaler Inhale 2 puffs into the lungs 2 (two) times daily. 1 Inhaler 5  . Cinnamon 500 MG capsule Take 2,000 mg by mouth daily.     . DULoxetine (CYMBALTA) 60 MG capsule Take 60 mg by mouth daily.    . fenofibrate (TRICOR) 145 MG tablet Take 145 mg by mouth every evening.     . ferrous sulfate 325 (65 FE) MG tablet Take 325-650 mg by mouth See admin instructions. Take two tablets on Monday Wednesday, Friday. And 1 tablet on all other days    . fexofenadine (ALLEGRA) 180 MG tablet Take 180 mg by mouth daily.    . fluticasone (FLONASE) 50 MCG/ACT nasal spray Place 2 sprays into both nostrils daily. 16 g 2  . folic acid (FOLVITE) 1 MG tablet Take 1 mg by mouth daily.    . furosemide (LASIX) 40 MG tablet Take 1-1.5 tablets (40-60 mg total) by mouth See admin instructions. 60mg  in the morning and 40mg  in the afternoon    . gabapentin (NEURONTIN) 600 MG tablet Take 1,200 mg by mouth 3 (three) times daily.    . insulin detemir (LEVEMIR) 100 UNIT/ML injection Inject 60 Units into the  skin at bedtime.     . iron polysaccharides (NIFEREX) 150 MG capsule Take 1 capsule (150 mg total) by mouth daily. 30 capsule 1  . linaclotide (LINZESS) 145 MCG CAPS capsule Take 1 capsule (145 mcg total) by mouth daily before breakfast. 30 capsule 5  . loratadine (CLARITIN) 10 MG tablet Take 1 tablet (10 mg total) by mouth daily. 30 tablet 1  . losartan (COZAAR) 100 MG tablet Take 100 mg by mouth daily.    . metFORMIN (GLUCOPHAGE) 1000 MG tablet Take 1,000 mg by mouth 2 (two) times daily with a meal.    . metoprolol tartrate (LOPRESSOR) 25 MG tablet Take 1 tablet (25 mg total) by mouth 2 (two) times daily. 180 tablet 3  .  omeprazole (PRILOSEC) 20 MG capsule Take 20 mg by mouth daily.    . pantoprazole (PROTONIX) 40 MG tablet Take 1 tablet (40 mg total) by mouth daily. 60 tablet 1  . predniSONE (DELTASONE) 20 MG tablet Take 3 tablets by mouth daily x2 days; then 2 tablets by mouth daily x2 days; then 1 tablet by mouth daily x3 days; then half tablet by mouth daily x3 days and stop prednisone. 15 tablet 0  . rOPINIRole (REQUIP) 2 MG tablet Take 2 mg by mouth at bedtime.     . rosuvastatin (CRESTOR) 40 MG tablet Take 40 mg by mouth every evening.     . sitaGLIPtin (JANUVIA) 50 MG tablet Take 50 mg by mouth daily.     . traMADol (ULTRAM) 50 MG tablet Take 50-100 mg by mouth every 6 (six) hours as needed for moderate pain.     . traZODone (DESYREL) 50 MG tablet Take 150 mg by mouth at bedtime.      No current facility-administered medications for this visit.     Past Surgical History:  Procedure Laterality Date  . CATARACT EXTRACTION, BILATERAL  2016  . COLONOSCOPY WITH ESOPHAGOGASTRODUODENOSCOPY (EGD) N/A 11/09/2012   Dr. 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 hpylori on bx- treated with pylera. TCS- normal rectum. 1 diminutive polyp in the mid descending segment. 1-12mm polyp in the mid desending segment o/w the remainder of the colonic mucosa appeared normal. tubular adenoma on bx  . GIVENS CAPSULE STUDY N/A 10/07/2013   no source for anemia or heme positive stool noted  . NECK SURGERY     bone spurs     No Known Allergies    Family History  Problem Relation Age of Onset  . Heart attack Mother   . Diabetes Sister        Fx  . Heart defect Sister        Fx  . Arthritis Sister        Fx  . Asthma Sister        Fx  . Kidney disease Sister        Fx  . Pancreatitis Sister   . Colon cancer Neg Hx      Social History Brett [name] reports that he has never smoked. His smokeless tobacco use includes chew. [name] reports no history of alcohol use.   Review of Systems CONSTITUTIONAL: No weight loss, fever, chills, weakness or fatigue.  HEENT: Eyes: No visual loss, blurred vision, double vision or yellow sclerae.No hearing loss, sneezing, congestion, runny nose or sore throat.  SKIN: No rash or itching.  CARDIOVASCULAR: per hpi RESPIRATORY: No shortness of breath, cough or sputum.  GASTROINTESTINAL: No anorexia,  nausea, vomiting or diarrhea. No abdominal pain or blood.  GENITOURINARY: No burning on urination, no polyuria NEUROLOGICAL: No headache, dizziness, syncope, paralysis, ataxia, numbness or tingling in the extremities. No change in bowel or bladder control.  MUSCULOSKELETAL: No muscle, back pain, joint pain or stiffness.  LYMPHATICS: No enlarged nodes. No history of splenectomy.  PSYCHIATRIC: No history of depression or anxiety.  ENDOCRINOLOGIC: No reports of sweating, cold or heat intolerance. No polyuria or polydipsia.  Brett Kitchen   Physical Examination Vitals:   10/14/19 1133  BP: 130/65  Pulse: 66  Temp: (!) 97.4 F (36.3 C)  SpO2: 99%   Filed Weights   10/14/19 1133  Weight: 226 lb (102.5 kg)    Gen: resting comfortably, no acute distress HEENT: no scleral icterus, pupils equal round and reactive, no palptable cervical adenopathy,  CV: RRR, no m/r/g, no jvd Resp: Clear to auscultation bilaterally GI: abdomen is soft, non-tender, non-distended, normal bowel sounds, no hepatosplenomegaly MSK: extremities are warm, no edema.  Skin: warm, no rash Neuro:  no focal deficits Psych: appropriate affect   Diagnostic Studies 05/2018 coronary CTA FINDINGS: Non-cardiac: See separate report from [facility].  Situs inversus totalis. The heart is on the right, liver on the left.  Pulmonary veins drain normally to the left atrium.  Calcium Score: 81 Agatston units.  Coronary Arteries: Right dominant with no anomalies  LM: Calcified plaque, no significant  stenosis.  LAD system: Small caliber LAD. Mixed plaque proximal to mid LAD, possible moderate stenosis.  Circumflex system: No plaque or stenosis.  RCA system: Mixed plaque distal RCA, no significant stenosis.  IMPRESSION: 1. Situs inversus totalis.  2. Coronary artery calcium score 81 Agatston units. This places the patient in the 51st percentile for age and gender, suggesting intermediate risk for future cardiac events.  3. The LAD is small in caliber with mixed plaque in the proximal to mid portion of the vessel. Cannot rule out moderate stenosis, will send for FFR.  FINDINGS: FFR 0.76 in the mid LAD.  IMPRESSION: Suspect hemodynamically significant proximal LAD stenosis.    Assessment and Plan   1Chronic diastolic HF -continues to do well, weights stable without signs of fluid overload - cotinue current meds  2. Elevated troponin/CAD - elevated trop in setting of CHF and hypoxia on prior admission - suspect probable demand ischemiain setting of chronic obstructive CAD based on his recent coronary CTA which showed mid LAD disease FFR 0.76 -in absence of recurrent cardiac symptom we will continue to manage this medically  - no recent symptoms, continue to monitor.     3. 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 -followed by Dr Luan Pulling.  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   5. Hyperlipidemia - contineu statin  6. HTN - at goal, continue current meds  7. Tachycardia - isolated episode by his pulse oximeter around the time of covid infection - monitor at this time, if significant recurrences would plan for outpatient monitor     Arnoldo Lenis, [name]

## 2019-10-20 NOTE — Progress Notes (Signed)
Referring Provider: [name], MD Primary Care Physician:  [name], MD Primary GI Physician: Dr. Gala Romney  Chief Complaint  Patient presents with  . Follow-up    acid reflux,needs refill on omeprazole    HPI:   Brett [name] is a 69 y.o. male presenting today with a history of Kartagener syndrome,diabetes, hypertension,COPDwith chronic respiratory failure on supplemental O2,diastolic CHF, GERD, H. Pylori s/p treatment with pylera, iron deficiency anemiawith extensive GI work-up in 2014/2015 including EGD, TCS, and Givens capsule without explanation followed by hematology/oncology.  He presents today for follow-up for IDA, GERD, and constipation.  He was due for repeat colonoscopy in 2019.  Ferritin 182, iron 30, saturation 7%  We last saw patient on 07/31/2019 for hospital follow-up.  He was admitted in October 2020 for COPD exacerbation and noted to have a drop in hemoglobin to 8.3 on 10/26 which was down from 12.5 two months prior.  He was without overt GI bleeding.  Denied any significant upper GI symptoms on omeprazole daily.  Lower GI symptoms of constipation.  He received Feraheme while inpatient.  Hemoglobin remained stable while inpatient.  Patient desired outpatient endoscopic evaluation due to family/home needs. On discharge, hemoglobin was 8.8.   At his last visit, he was without overt GI bleeding.  His only real GI symptom was constipation which he stated had been present since his 43s.  Taking stool softeners and laxatives daily.  No significant upper GI symptoms on PPI daily.  He was continuing to have trouble with his breathing/sinuses.  Was recently placed on Augmentin.  Patient needed colonoscopy +/-EGD for further evaluation of worsening IDA but due to respiratory status, he was advised to follow-up with Dr. Luan Pulling and PCP and we would see him back in 6 weeks to reevaluate candidacy for sedation.  For constipation, he was advised to stop his stool softeners and  laxatives and start Linzess 145 mcg.  Received labs from PCP dated 07/31/2019 with hemoglobin 9L (stable).  BNP was 1567H  Spoke with Dr. Luan Pulling, patient's pulmonologist, who stated when he saw patient the week of 07/29/2019, he was not a candidate for sedation.  Reported his respiratory status was far from baseline.  Recommended he follow-up with [facility] pulmonology as he would be retiring soon.  Per telephone note dated 09/10/2019, patient was to have an appointment with [facility] pulmonology last week of January.   Reported having COVID-19 on January 5th.   Recently saw cardiology in follow-up on 10/14/2019.  Was felt to be doing fairly well.  Plans to continue current medications.  Has not seen oncology since hospitalization in October 2020.   Today:  GERD: Well controlled. Was taking omeprazole and Protonix. Didn't know both of these medications were for GERD. He is currently out of both and needing a refill. No reflux symptoms. No dysphagia. No nausea or vomiting.  No abdominal pain.  Costipation: Taking Linzess 145 mcg. States this "works like a Nutritional therapist."  Having a BM daily. Not watery typically. Usually, soft and formed/mushy. No blood in the stool. Occasional black stools on iron.   No pre-syncope or syncope. No fever or chills. Weight is stable. Was only sick for 1.5 days with COVID. Has recovered. Feels his breathing is significantly better and he is back to his baseline. He is only using 1.5 L supplemental oxygen at this time.  Can go without oxygen at times.  Was using 3-4L O2 when I saw him last. Occasional chronic cough. No chest pain or  heart palpitations.   Taking 2 Aleve a day. Tylenol doesn't work well.   Past Medical History:  Diagnosis Date  . Chronic diastolic heart failure ([place])   . COPD (chronic obstructive pulmonary disease) ([place])   . Depression   . Dextrocardia   . Diabetes mellitus   . GERD (gastroesophageal reflux disease)   . H. pylori infection 11/09/2012    treated with pylera  . HLD (hyperlipidemia)   . HTN (hypertension)    Cholesterol  . Iron deficiency anemia, unspecified 10/18/2012  . Neuropathy   . Pneumonia   . Situs inversus totalis   . Tachycardia     Past Surgical History:  Procedure Laterality Date  . CATARACT EXTRACTION, BILATERAL  2016  . COLONOSCOPY WITH ESOPHAGOGASTRODUODENOSCOPY (EGD) N/A 11/09/2012   Dr. 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 hpylori on bx- treated with pylera. TCS- normal rectum. 1 diminutive polyp in the mid descending segment. 1-36mm polyp in the mid desending segment o/w the remainder of the colonic mucosa appeared normal. tubular adenoma on bx  . GIVENS CAPSULE STUDY N/A 10/07/2013   no source for anemia or heme positive stool noted  . NECK SURGERY     bone spurs    Current Outpatient Medications  Medication Sig Dispense Refill  . albuterol (PROAIR HFA) 108 (90 Base) MCG/ACT inhaler Inhale 2 puffs into the lungs every 4 (four) hours as needed for wheezing or shortness of breath. 1 Inhaler 2  . aspirin EC 81 MG tablet Take 81 mg by mouth daily.    . budesonide-formoterol (SYMBICORT) 160-4.5 MCG/ACT inhaler Inhale 2 puffs into the lungs 2 (two) times daily. 1 Inhaler 5  . Cinnamon 500 MG capsule Take 2,000 mg by mouth daily.     . DULoxetine (CYMBALTA) 60 MG capsule Take 60 mg by mouth daily.    . fenofibrate (TRICOR) 145 MG tablet Take 145 mg by mouth every evening.     . ferrous sulfate 325 (65 FE) MG tablet Take 325-650 mg by mouth See admin instructions. Take two tablets on Monday Wednesday, Friday. And 1 tablet on all other days    . fexofenadine (ALLEGRA) 180 MG tablet Take 180 mg by mouth daily.    . fluticasone (FLONASE) 50 MCG/ACT nasal spray Place 2 sprays into both nostrils daily. 16 g 2  . folic acid (FOLVITE) 1 MG tablet Take 1 mg by mouth daily.    . furosemide (LASIX) 40 MG tablet Take 1-1.5 tablets  (40-60 mg total) by mouth See admin instructions. 60mg  in the morning and 40mg  in the afternoon    . gabapentin (NEURONTIN) 600 MG tablet Take 1,200 mg by mouth 3 (three) times daily.    . insulin detemir (LEVEMIR) 100 UNIT/ML injection Inject 60 Units into the skin at bedtime.     . iron polysaccharides (NIFEREX) 150 MG capsule Take 1 capsule (150 mg total) by mouth daily. 30 capsule 1  . linaclotide (LINZESS) 145 MCG CAPS capsule Take 1 capsule (145 mcg total) by mouth daily before breakfast. 30 capsule 5  . loratadine (CLARITIN) 10 MG tablet Take 1 tablet (10 mg total) by mouth daily. 30 tablet 1  . losartan (COZAAR) 100 MG tablet Take 100 mg by mouth daily.    . metFORMIN (GLUCOPHAGE) 1000 MG tablet Take 1,000 mg by mouth 2 (two) times daily with a meal.    . metoprolol tartrate (LOPRESSOR) 25 MG tablet Take 1 tablet (25 mg total)  by mouth 2 (two) times daily. 180 tablet 3  . pantoprazole (PROTONIX) 40 MG tablet Take 1 tablet (40 mg total) by mouth daily. 30 tablet 11  . rOPINIRole (REQUIP) 2 MG tablet Take 2 mg by mouth at bedtime.     . rosuvastatin (CRESTOR) 40 MG tablet Take 40 mg by mouth every evening.     . sitaGLIPtin (JANUVIA) 50 MG tablet Take 50 mg by mouth daily.     . traMADol (ULTRAM) 50 MG tablet Take 50-100 mg by mouth every 6 (six) hours as needed for moderate pain.     . traZODone (DESYREL) 50 MG tablet Take 150 mg by mouth at bedtime.     . polyethylene glycol-electrolytes (TRILYTE) 420 g solution Take 4,000 mLs by mouth as directed. 4000 mL 0   No current facility-administered medications for this visit.    Allergies as of 10/21/2019  . (No Known Allergies)    Family History  Problem Relation Age of Onset  . Heart attack Mother   . Diabetes Sister        Fx  . Heart defect Sister        Fx  . Arthritis Sister        Fx  . Asthma Sister        Fx  . Kidney disease Sister        Fx  . Pancreatitis Sister   . Colon cancer Neg Hx     Social History    Socioeconomic History  . Marital status: Married    Spouse name: Not on file  . Number of children: 0  . Years of education: 7th grade   . Highest education level: Not on file  Occupational History  . Occupation: Biomedical scientist   Tobacco Use  . Smoking status: Never Smoker  . Smokeless tobacco: Current User    Types: Chew  Substance and Sexual Activity  . Alcohol use: No  . Drug use: No  . Sexual activity: Not on file  Other Topics Concern  . Not on file  Social History Narrative  . Not on file   Social Determinants of Health   Financial Resource Strain:   . Difficulty of Paying Living Expenses: Not on file  Food Insecurity:   . Worried About Charity fundraiser in the Last Year: Not on file  . Ran Out of Food in the Last Year: Not on file  Transportation Needs:   . Lack of Transportation (Medical): Not on file  . Lack of Transportation (Non-Medical): Not on file  Physical Activity:   . Days of Exercise per Week: Not on file  . Minutes of Exercise per Session: Not on file  Stress:   . Feeling of Stress : Not on file  Social Connections:   . Frequency of Communication with Friends and Family: Not on file  . Frequency of Social Gatherings with Friends and Family: Not on file  . Attends Religious Services: Not on file  . Active Member of Clubs or Organizations: Not on file  . Attends Archivist Meetings: Not on file  . Marital Status: Not on file    Review of Systems: Gen: See HPI  CV: See HPI Resp: See HPI GI: See HPI Derm: Denies rash.  Psych: Denies depression or anxiety.  Heme: See HPI   Physical Exam: BP (!) 107/47   Pulse 64   Temp (!) 96.8 F (36 C) (Temporal)   Ht 5\' 7"  (1.702 m)  Wt 224 lb 9.6 oz (101.9 kg)   BMI 35.18 kg/m  General:   Alert and oriented. No distress noted. Pleasant and cooperative.  Head:  Normocephalic and atraumatic. Eyes:  Conjuctiva clear without scleral icterus. Heart:  S1, S2 present without murmurs  appreciated. Lungs:  Clear to auscultation bilaterally. No wheezes, rales, or rhonchi. No distress.  Abdomen:  +BS, soft, non-tender and non-distended. No rebound or guarding. No HSM or masses noted. Msk:  Symmetrical without gross deformities. Normal posture. Extremities:  Without edema. Neurologic:  Alert and  oriented x4 Psych:  Normal mood and affect.

## 2019-10-21 ENCOUNTER — Other Ambulatory Visit: Payer: Self-pay

## 2019-10-21 ENCOUNTER — Encounter: Payer: Self-pay | Admitting: Gastroenterology

## 2019-10-21 ENCOUNTER — Ambulatory Visit: Payer: Medicare HMO | Admitting: Gastroenterology

## 2019-10-21 VITALS — BP 107/47 | HR 64 | Temp 96.8°F | Ht 67.0 in | Wt 224.6 lb

## 2019-10-21 DIAGNOSIS — K5900 Constipation, unspecified: Secondary | ICD-10-CM

## 2019-10-21 DIAGNOSIS — Z86010 Personal history of colonic polyps: Secondary | ICD-10-CM

## 2019-10-21 DIAGNOSIS — Z860101 Personal history of adenomatous and serrated colon polyps: Secondary | ICD-10-CM

## 2019-10-21 DIAGNOSIS — D509 Iron deficiency anemia, unspecified: Secondary | ICD-10-CM

## 2019-10-21 DIAGNOSIS — K219 Gastro-esophageal reflux disease without esophagitis: Secondary | ICD-10-CM

## 2019-10-21 MED ORDER — PANTOPRAZOLE SODIUM 40 MG PO TBEC: 40.0000 mg | DELAYED_RELEASE_TABLET | Freq: Every day | ORAL | 11 refills | Status: DC

## 2019-10-21 MED ORDER — PEG 3350-KCL-NA BICARB-NACL 420 G PO SOLR: 4000.0000 mL | ORAL | 0 refills | Status: DC

## 2019-10-21 NOTE — Patient Instructions (Addendum)
Please call your primary care today to follow-up on your low blood pressure.  Your blood pressure is 107/47 today.  This is on the low side.  Continue to monitor this at home as well.  Should you have dizziness, lightheadedness, feeling like you will pass out, proceed to the emergency room.  Call oncology to schedule a follow-up appointment.  Please reach back out to [facility] pulmonology to schedule an appointment.  Please have labs completed.  We will get you scheduled for a colonoscopy and possible upper endoscopy in the near future to evaluate your iron deficiency anemia. Medication adjustments for your procedures: 1 day prior: 1/2 tablet of Januvia (25 mg),  1/2 dose of metformin (500 mg with breakfast and dinner), one half dose of Levemir (15 units) Day of procedure: Hold all diabetes medications the morning of your procedure.  Continue taking Protonix 40 mg daily 30 minutes before breakfast.    Stop omeprazole.  Continue taking Linzess 145 mcg in the morning on empty stomach.  We will plan to follow-up with you in 6 months.  Call if you have questions or concerns prior.  I am glad you are feeling much better!  Aliene Altes, PA-C [facility]

## 2019-10-21 NOTE — Assessment & Plan Note (Addendum)
69 year old male with past medical history of COPD on 1.5 L supplemental oxygen, chronic diastolic heart failure, diabetes, HTN, HLD, GERD, constipation, and chronic iron deficiency anemia with GI work-up in 2014/2015 including EGD, TCS, Givens capsule without significant findings other than gastric erosions and H. pylori s/p treatment with Pylera.  He is currently followed by hematology and on oral iron. Admitted in October 2020 for COPD exacerbation and noted to have drop in hemoglobin to 8.3, down from 12.5 in August 2020.  No overt GI bleeding.  Ferritin normal at 182.  Iron 30L and percent saturation 7%L.  Received Feraheme while inpatient and preferred outpatient endoscopic evaluation.  He was seen in November 2020 to discuss scheduling TCS/EGD but due to respiratory status, would plan to follow-up in 6 weeks to reevaluate.  Again without overt GI bleeding.  Labs on 07/31/2019 with stable hemoglobin at 9.  No labs completed since then.  Today his respiratory status is much better.  He had been requiring 3-4 L supplemental oxygen when I saw him in November, currently on 1.5 L and can go without oxygen at times.  No overt GI bleeding.  No significant upper or lower GI symptoms.  GERD is well controlled.  Constipation is well managed.  He does have history of colon polyps with 2 tubular adenomas in 2014 and currently overdue for surveillance colonoscopy.   As his respiratory status is much improved, and he was also recently seen by cardiology who felt he was doing well, I feel it is appropriate to proceed with procedures. I will go ahead and schedule TCS with possible EGD. I am also rechecking labs. If hemoglobin has remained stable, he may not need EGD. Will leave that decision to Dr. Gala Romney at the time of procedure.   Proceed with TCS +/-EGD with propofol with Dr. Gala Romney in the near future. The risks, benefits, and alternatives have been discussed in detail with patient. They have stated understanding and  desire to proceed.  Propofol due to chronic medical conditions and medications including Cymbalta, gabapentin, tramadol, trazodone. See separate instructions for medication adjustments. Update CBC and iron panel. Continue daily iron but hold 7 days prior to procedure. Continue Protonix 40 mg daily 30 minutes before breakfast. Requested patient reach out to hematology to schedule follow-up appointment as he has not seen them since August 2020.   Follow-up in 6 months.

## 2019-10-21 NOTE — Assessment & Plan Note (Addendum)
Much improved since starting Linzess 145 mcg daily.  Now having BMs daily.  Typically soft and formed or mushy.  No blood in the stool.  Occasional black stools in the setting of iron.  Last colonoscopy in 2014 with 2 tubular adenomas.  He was due for repeat in 2019.  Of note, he does have history of IDA followed by hematology but was noted to have downtrending hemoglobin in October 2020 when admitted for COPD exacerbation.  Hemoglobin on 10/26 was 8.3 which was down from 12.5 two months prior. Again, no overt GI bleeding at that time. Had planned for TCS +/- EGD at last visit for further evaluation of downtrending hemoglobin in November 2020 but had to hold off due to respiratory status which is now much improved.  Continue Linzess 145 mcg daily on empty stomach. Proceed with TCS +/- EGD with propofol with Dr. Gala Romney in the near future. The risks, benefits, and alternatives have been discussed in detail with patient. They have stated understanding and desire to proceed.  Propofol due to chronic medical conditions and medications including Cymbalta, gabapentin, trazodone, and tramadol. See separate instructions for medication adjustments. Follow-up in 6 months.

## 2019-10-21 NOTE — Assessment & Plan Note (Addendum)
Currently overdue for surveillance colonoscopy.  Last colonoscopy in February 2014 with 2 tubular adenomas.  No significant upper or lower GI symptoms.  Constipation is well managed on Linzess 145 mcg daily.  Denies bright red blood per rectum.  He does have black stools in the setting of iron.  Of note, history of IDA with downtrending hemoglobin in October 2020 while admitted for COPD exacerbation as discussed above.  Repeat labs on 07/31/2019 with hemoglobin stable at 9. His respiratory status is now much improved and he is back to his baseline.  Only requiring 1.5 L supplemental oxygen and occasionally going without oxygen.   We will proceed with scheduling colonoscopy and add on possible upper endoscopy for further evaluation of downtrending hemoglobin.  Follow-up in 6 months.   Proceed with TCS +/- EGD with propofol with Dr. Gala Romney in the near future. The risks, benefits, and alternatives have been discussed in detail with patient. They have stated understanding and desire to proceed.  See separate instructions for medication adjustments. Follow-up in 6 months.

## 2019-10-21 NOTE — Assessment & Plan Note (Signed)
Well-controlled.  No abdominal pain or dysphagia.  Occasional black stools in the setting of iron. He has been taking omeprazole 20 mg and Protonix 40 mg daily since discharge from the hospital in October 2020.  Patient and his wife were aware that both these medications were for GERD.  He is currently out of both medications and requesting a refill.  He was advised to stop omeprazole and continue Protonix 40 mg daily 30 minutes before breakfast.  Refill for Protonix sent to [facility] mail delivery. Follow-up in 6 months.  Call if questions or concerns prior.

## 2019-10-22 ENCOUNTER — Telehealth: Payer: Self-pay | Admitting: Internal Medicine

## 2019-10-22 NOTE — Telephone Encounter (Signed)
Pt wants to reschedule his colonoscopy, [phone]

## 2019-10-22 NOTE — Progress Notes (Signed)
Cc'ed to pcp °

## 2019-10-22 NOTE — Telephone Encounter (Signed)
Called and spoke to pt's wife. She decided to keep procedure as scheduled.

## 2019-10-31 ENCOUNTER — Ambulatory Visit: Payer: Medicare HMO | Admitting: Orthopaedic Surgery

## 2019-11-11 ENCOUNTER — Telehealth: Payer: Self-pay | Admitting: *Deleted

## 2019-11-11 NOTE — Telephone Encounter (Signed)
PA done via [employer]. PA is pending clinical review. Tracking # [id number] for TCS, tracking # [id number] for EGD

## 2019-11-12 ENCOUNTER — Other Ambulatory Visit: Payer: Self-pay

## 2019-11-12 ENCOUNTER — Inpatient Hospital Stay (HOSPITAL_COMMUNITY): Payer: Medicare HMO | Attending: Hematology

## 2019-11-12 DIAGNOSIS — D509 Iron deficiency anemia, unspecified: Secondary | ICD-10-CM | POA: Diagnosis present

## 2019-11-12 DIAGNOSIS — I110 Hypertensive heart disease with heart failure: Secondary | ICD-10-CM | POA: Insufficient documentation

## 2019-11-12 DIAGNOSIS — Z7982 Long term (current) use of aspirin: Secondary | ICD-10-CM | POA: Insufficient documentation

## 2019-11-12 DIAGNOSIS — Z86010 Personal history of colonic polyps: Secondary | ICD-10-CM | POA: Insufficient documentation

## 2019-11-12 DIAGNOSIS — R42 Dizziness and giddiness: Secondary | ICD-10-CM | POA: Diagnosis not present

## 2019-11-12 DIAGNOSIS — E119 Type 2 diabetes mellitus without complications: Secondary | ICD-10-CM | POA: Diagnosis not present

## 2019-11-12 DIAGNOSIS — D72829 Elevated white blood cell count, unspecified: Secondary | ICD-10-CM | POA: Diagnosis not present

## 2019-11-12 DIAGNOSIS — K921 Melena: Secondary | ICD-10-CM | POA: Diagnosis not present

## 2019-11-12 DIAGNOSIS — J449 Chronic obstructive pulmonary disease, unspecified: Secondary | ICD-10-CM | POA: Diagnosis not present

## 2019-11-12 DIAGNOSIS — Z79899 Other long term (current) drug therapy: Secondary | ICD-10-CM | POA: Insufficient documentation

## 2019-11-12 DIAGNOSIS — D500 Iron deficiency anemia secondary to blood loss (chronic): Secondary | ICD-10-CM

## 2019-11-12 DIAGNOSIS — G629 Polyneuropathy, unspecified: Secondary | ICD-10-CM | POA: Diagnosis not present

## 2019-11-12 DIAGNOSIS — K219 Gastro-esophageal reflux disease without esophagitis: Secondary | ICD-10-CM | POA: Diagnosis not present

## 2019-11-12 DIAGNOSIS — I5032 Chronic diastolic (congestive) heart failure: Secondary | ICD-10-CM | POA: Insufficient documentation

## 2019-11-12 DIAGNOSIS — F329 Major depressive disorder, single episode, unspecified: Secondary | ICD-10-CM | POA: Diagnosis not present

## 2019-11-12 DIAGNOSIS — E871 Hypo-osmolality and hyponatremia: Secondary | ICD-10-CM | POA: Insufficient documentation

## 2019-11-12 DIAGNOSIS — Z794 Long term (current) use of insulin: Secondary | ICD-10-CM | POA: Insufficient documentation

## 2019-11-12 DIAGNOSIS — E785 Hyperlipidemia, unspecified: Secondary | ICD-10-CM | POA: Insufficient documentation

## 2019-11-12 LAB — CBC WITH DIFFERENTIAL/PLATELET
Abs Immature Granulocytes: 0.05 10*3/uL (ref 0.00–0.07)
Basophils Absolute: 0 10*3/uL (ref 0.0–0.1)
Basophils Relative: 0 %
Eosinophils Absolute: 0.2 10*3/uL (ref 0.0–0.5)
Eosinophils Relative: 1 %
HCT: 36.2 % — ABNORMAL LOW (ref 39.0–52.0)
Hemoglobin: 11.3 g/dL — ABNORMAL LOW (ref 13.0–17.0)
Immature Granulocytes: 0 %
Lymphocytes Relative: 17 %
Lymphs Abs: 2.2 10*3/uL (ref 0.7–4.0)
MCH: 29.4 pg (ref 26.0–34.0)
MCHC: 31.2 g/dL (ref 30.0–36.0)
MCV: 94 fL (ref 80.0–100.0)
Monocytes Absolute: 1.1 10*3/uL — ABNORMAL HIGH (ref 0.1–1.0)
Monocytes Relative: 8 %
Neutro Abs: 9.4 10*3/uL — ABNORMAL HIGH (ref 1.7–7.7)
Neutrophils Relative %: 74 %
Platelets: 441 10*3/uL — ABNORMAL HIGH (ref 150–400)
RBC: 3.85 MIL/uL — ABNORMAL LOW (ref 4.22–5.81)
RDW: 13.6 % (ref 11.5–15.5)
WBC: 13 10*3/uL — ABNORMAL HIGH (ref 4.0–10.5)
nRBC: 0 % (ref 0.0–0.2)

## 2019-11-12 LAB — COMPREHENSIVE METABOLIC PANEL
ALT: 14 U/L (ref 0–44)
AST: 22 U/L (ref 15–41)
Albumin: 4.1 g/dL (ref 3.5–5.0)
Alkaline Phosphatase: 39 U/L (ref 38–126)
Anion gap: 10 (ref 5–15)
BUN: 18 mg/dL (ref 8–23)
CO2: 28 mmol/L (ref 22–32)
Calcium: 9.6 mg/dL (ref 8.9–10.3)
Chloride: 90 mmol/L — ABNORMAL LOW (ref 98–111)
Creatinine, Ser: 1.16 mg/dL (ref 0.61–1.24)
GFR calc Af Amer: >60 mL/min (ref >60)
GFR calc non Af Amer: >60 mL/min (ref >60)
Glucose, Bld: 179 mg/dL — ABNORMAL HIGH (ref 70–99)
Potassium: 4.1 mmol/L (ref 3.5–5.1)
Sodium: 128 mmol/L — ABNORMAL LOW (ref 135–145)
Total Bilirubin: 0.4 mg/dL (ref 0.3–1.2)
Total Protein: 7.4 g/dL (ref 6.5–8.1)

## 2019-11-12 LAB — IRON AND TIBC
Iron: 63 ug/dL (ref 45–182)
Saturation Ratios: 12 % — ABNORMAL LOW (ref 17.9–39.5)
TIBC: 522 ug/dL — ABNORMAL HIGH (ref 250–450)
UIBC: 459 ug/dL

## 2019-11-12 LAB — FERRITIN: Ferritin: 134 ng/mL (ref 24–336)

## 2019-11-19 NOTE — Telephone Encounter (Signed)
PA approved. Auth# [id number] dates 01/09/2020-02/08/2020

## 2019-11-20 ENCOUNTER — Other Ambulatory Visit: Payer: Self-pay

## 2019-11-20 ENCOUNTER — Inpatient Hospital Stay (HOSPITAL_COMMUNITY): Payer: Medicare HMO | Admitting: Hematology

## 2019-11-20 VITALS — BP 110/40 | HR 63 | Temp 97.3°F | Resp 18 | Wt 222.7 lb

## 2019-11-20 DIAGNOSIS — D509 Iron deficiency anemia, unspecified: Secondary | ICD-10-CM | POA: Diagnosis not present

## 2019-11-20 DIAGNOSIS — D500 Iron deficiency anemia secondary to blood loss (chronic): Secondary | ICD-10-CM | POA: Diagnosis not present

## 2019-11-20 NOTE — Assessment & Plan Note (Signed)
1.  Iron deficiency anemia: -He was recently in the hospital in October when his hemoglobin dropped and he had received Feraheme on 07/09/2019. -Denies any bleeding per rectum or melena.  Has occasional dark stools but not black. -He takes one iron tablet daily and 2 tablets on Monday, Wednesday and Friday. -He is being planned to have colonoscopy and endoscopy by Dr. Gala Romney end of this month. -I have reviewed his labs.  Ferritin is 134 and percent saturation is 12.  Hemoglobin is 11.3.  Creatinine is 1.16. -He will continue oral iron therapy.  I will see him back in 3 months for follow-up with repeat labs.  We will also check ferritin, iron panel, 123456 and folic acid levels.  2.  Leukocytosis and thrombocytosis: -Work-up for myeloproliferative disorders was previously done and was negative.  3.  Hyponatremia: -Sodium is slightly low at 128.  He is on furosemide for fluid overload.  He is also on salt restricted diet.

## 2019-11-20 NOTE — Patient Instructions (Signed)
[facility] at [facility] Discharge Instructions  You were seen today by Dr. Katragadda. He went over your recent lab results. He will see you back in 3 months for labs and follow up.   Thank you for choosing [facility] at [facility] to provide your oncology and hematology care.  To afford each patient quality time with our provider, please arrive at least 15 minutes before your scheduled appointment time.   If you have a lab appointment with the Cancer Center please come in thru the  Main Entrance and check in at the main information desk  You need to re-schedule your appointment should you arrive 10 or more minutes late.  We strive to give you quality time with our providers, and arriving late affects you and other patients whose appointments are after yours.  Also, if you no show three or more times for appointments you may be dismissed from the clinic at the providers discretion.     Again, thank you for choosing [facility].  Our hope is that these requests will decrease the amount of time that you wait before being seen by our physicians.       _____________________________________________________________  Should you have questions after your visit to [facility], please contact our office at [phone] between the hours of 8:00 a.m. and 4:30 p.m.  Voicemails left after 4:00 p.m. will not be returned until the following business day.  For prescription refill requests, have your pharmacy contact our office and allow 72 hours.    Cancer Center Support Programs:   > Cancer Support Group  2nd Tuesday of the month 1pm-2pm, Journey Room    

## 2019-11-20 NOTE — Progress Notes (Signed)
[address], [place] [address]   CLINIC:  Medical Oncology/Hematology  PCP:  [name], [address] [place] [address] [phone]   REASON FOR VISIT:  Follow-up for Iron deficiency anemia     INTERVAL HISTORY:  Brett [name] 69 y.o. male seen for follow-up of iron deficiency state.  Reportedly was in the hospital couple of times since he saw me last.  Denied any bleeding per rectum or melena.  Occasional dark-colored stools.  He is currently taking iron tablet as prescribed.  Appetite and energy levels are 100%.  Occasional dizziness is present.  Reportedly on a low-sodium diet and is using furosemide.   REVIEW OF SYSTEMS:  Review of Systems  Neurological: Positive for dizziness and numbness.  All other systems reviewed and are negative.    PAST MEDICAL/SURGICAL HISTORY:  Past Medical History:  Diagnosis Date  . Chronic diastolic heart failure ([place])   . COPD (chronic obstructive pulmonary disease) ([place])   . Depression   . Dextrocardia   . Diabetes mellitus   . GERD (gastroesophageal reflux disease)   . H. pylori infection 11/09/2012   treated with pylera  . HLD (hyperlipidemia)   . HTN (hypertension)    Cholesterol  . Iron deficiency anemia, unspecified 10/18/2012  . Neuropathy   . Pneumonia   . Situs inversus totalis   . Tachycardia    Past Surgical History:  Procedure Laterality Date  . CATARACT EXTRACTION, BILATERAL  2016  . COLONOSCOPY WITH ESOPHAGOGASTRODUODENOSCOPY (EGD) N/A 11/09/2012   Dr. 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 hpylori on bx- treated with pylera. TCS- normal rectum. 1 diminutive polyp in the mid descending segment. 1-14mm polyp in the mid desending segment o/w the remainder of the colonic mucosa appeared normal. tubular adenoma on bx  . GIVENS CAPSULE STUDY N/A 10/07/2013   no source for anemia or  heme positive stool noted  . NECK SURGERY     bone spurs     SOCIAL HISTORY:  Social History   Socioeconomic History  . Marital status: Married    Spouse name: Not on file  . Number of children: 0  . Years of education: 7th grade   . Highest education level: Not on file  Occupational History  . Occupation: Biomedical scientist   Tobacco Use  . Smoking status: Never Smoker  . Smokeless tobacco: Current User    Types: Chew  Substance and Sexual Activity  . Alcohol use: No  . Drug use: No  . Sexual activity: Not on file  Other Topics Concern  . Not on file  Social History Narrative  . Not on file   Social Determinants of Health   Financial Resource Strain:   . Difficulty of Paying Living Expenses: Not on file  Food Insecurity:   . Worried About Charity fundraiser in the Last Year: Not on file  . Ran Out of Food in the Last Year: Not on file  Transportation Needs:   . Lack of Transportation (Medical): Not on file  . Lack of Transportation (Non-Medical): Not on file  Physical Activity:   . Days of Exercise per Week: Not on file  . Minutes of Exercise per Session: Not on file  Stress:   . Feeling of Stress : Not on file  Social Connections:   . Frequency of Communication with Friends and Family: Not on file  . Frequency of Social  Gatherings with Friends and Family: Not on file  . Attends Religious Services: Not on file  . Active Member of Clubs or Organizations: Not on file  . Attends Archivist Meetings: Not on file  . Marital Status: Not on file  Intimate Partner Violence:   . Fear of Current or Ex-Partner: Not on file  . Emotionally Abused: Not on file  . Physically Abused: Not on file  . Sexually Abused: Not on file    FAMILY HISTORY:  Family History  Problem Relation Age of Onset  . Heart attack Mother   . Diabetes Sister        Fx  . Heart defect Sister        Fx  . Arthritis Sister        Fx  . Asthma Sister        Fx  . Kidney disease Sister         Fx  . Pancreatitis Sister   . Colon cancer Neg Hx     CURRENT MEDICATIONS:  Outpatient Encounter Medications as of 11/20/2019  Medication Sig  . aspirin EC 81 MG tablet Take 81 mg by mouth daily.  . budesonide-formoterol (SYMBICORT) 160-4.5 MCG/ACT inhaler Inhale 2 puffs into the lungs 2 (two) times daily.  . Cinnamon 500 MG capsule Take 2,000 mg by mouth daily.   . DULoxetine (CYMBALTA) 60 MG capsule Take 60 mg by mouth daily.  . fenofibrate (TRICOR) 145 MG tablet Take 145 mg by mouth every evening.   . ferrous sulfate 325 (65 FE) MG tablet Take 325-650 mg by mouth See admin instructions. Take two tablets on Monday Wednesday, Friday. And 1 tablet on all other days  . fexofenadine (ALLEGRA) 180 MG tablet Take 180 mg by mouth daily.  . folic acid (FOLVITE) 1 MG tablet Take 1 mg by mouth daily.  . furosemide (LASIX) 40 MG tablet Take 1-1.5 tablets (40-60 mg total) by mouth See admin instructions. 60mg  in the morning and 40mg  in the afternoon  . gabapentin (NEURONTIN) 600 MG tablet Take 1,200 mg by mouth 3 (three) times daily.  . insulin detemir (LEVEMIR) 100 UNIT/ML injection Inject 60 Units into the skin at bedtime.   . iron polysaccharides (NIFEREX) 150 MG capsule Take 1 capsule (150 mg total) by mouth daily.  Marland Kitchen linaclotide (LINZESS) 145 MCG CAPS capsule Take 1 capsule (145 mcg total) by mouth daily before breakfast.  . loratadine (CLARITIN) 10 MG tablet Take 1 tablet (10 mg total) by mouth daily.  Marland Kitchen losartan (COZAAR) 100 MG tablet Take 100 mg by mouth daily.  . metFORMIN (GLUCOPHAGE) 1000 MG tablet Take 1,000 mg by mouth 2 (two) times daily with a meal.  . metoprolol tartrate (LOPRESSOR) 25 MG tablet Take 1 tablet (25 mg total) by mouth 2 (two) times daily.  . pantoprazole (PROTONIX) 40 MG tablet Take 1 tablet (40 mg total) by mouth daily.  . pioglitazone (ACTOS) 45 MG tablet   . rOPINIRole (REQUIP) 2 MG tablet Take 2 mg by mouth at bedtime.   . rosuvastatin (CRESTOR) 40 MG tablet  Take 40 mg by mouth every evening.   . sitaGLIPtin (JANUVIA) 50 MG tablet Take 50 mg by mouth daily.   . traZODone (DESYREL) 50 MG tablet Take 150 mg by mouth at bedtime.   Marland Kitchen albuterol (PROAIR HFA) 108 (90 Base) MCG/ACT inhaler Inhale 2 puffs into the lungs every 4 (four) hours as needed for wheezing or shortness of breath. (Patient not taking: Reported on  11/20/2019)  . fluticasone (FLONASE) 50 MCG/ACT nasal spray Place 2 sprays into both nostrils daily. (Patient not taking: Reported on 11/20/2019)  . polyethylene glycol-electrolytes (TRILYTE) 420 g solution Take 4,000 mLs by mouth as directed. (Patient not taking: Reported on 11/20/2019)  . traMADol (ULTRAM) 50 MG tablet Take 50-100 mg by mouth every 6 (six) hours as needed for moderate pain.    No facility-administered encounter medications on file as of 11/20/2019.    ALLERGIES:  No Known Allergies   PHYSICAL EXAM:  ECOG Performance status: 1  Vitals:   11/20/19 1130  BP: (!) 110/40  Pulse: 63  Resp: 18  Temp: (!) 97.3 F (36.3 C)  SpO2: 97%   Filed Weights   11/20/19 1129 11/20/19 1130  Weight: 222 lb 11.2 oz (101 kg) 222 lb 11.2 oz (101 kg)    Physical Exam Vitals reviewed.  Constitutional:      Appearance: Normal appearance.  Abdominal:     General: There is no distension.     Palpations: Abdomen is soft. There is no mass.  Musculoskeletal:        General: No swelling.  Skin:    General: Skin is warm.  Neurological:     General: No focal deficit present.     Mental Status: He is alert and oriented to person, place, and time.  Psychiatric:        Mood and Affect: Mood normal.        Behavior: Behavior normal.      LABORATORY DATA:  I have reviewed the labs as listed.  CBC    Component Value Date/Time   WBC 13.0 (H) 11/12/2019 1417   RBC 3.85 (L) 11/12/2019 1417   HGB 11.3 (L) 11/12/2019 1417   HCT 36.2 (L) 11/12/2019 1417   PLT 441 (H) 11/12/2019 1417   MCV 94.0 11/12/2019 1417   MCH 29.4 11/12/2019  1417   MCHC 31.2 11/12/2019 1417   RDW 13.6 11/12/2019 1417   LYMPHSABS 2.2 11/12/2019 1417   MONOABS 1.1 (H) 11/12/2019 1417   EOSABS 0.2 11/12/2019 1417   BASOSABS 0.0 11/12/2019 1417   CMP Latest Ref Rng & Units 11/12/2019 07/11/2019 07/10/2019  Glucose 70 - 99 mg/dL 179(H) 144(H) 218(H)  BUN 8 - 23 mg/dL 18 19 18   Creatinine 0.61 - 1.24 mg/dL 1.16 0.76 0.78  Sodium 135 - 145 mmol/L 128(L) 134(L) 131(L)  Potassium 3.5 - 5.1 mmol/L 4.1 4.5 5.3(H)  Chloride 98 - 111 mmol/L 90(L) 92(L) 90(L)  CO2 22 - 32 mmol/L 28 29 31   Calcium 8.9 - 10.3 mg/dL 9.6 10.1 9.2  Total Protein 6.5 - 8.1 g/dL 7.4 - -  Total Bilirubin 0.3 - 1.2 mg/dL 0.4 - -  Alkaline Phos 38 - 126 U/L 39 - -  AST 15 - 41 U/L 22 - -  ALT 0 - 44 U/L 14 - -       DIAGNOSTIC IMAGING:  I have independently reviewed the scans.   I have reviewed Venita Lick LPN's note and agree with the documentation.  I personally performed a face-to-face visit, made revisions and my assessment and plan is as follows.    ASSESSMENT & PLAN:   Iron deficiency anemia 1.  Iron deficiency anemia: -He was recently in the hospital in October when his hemoglobin dropped and he had received Feraheme on 07/09/2019. -Denies any bleeding per rectum or melena.  Has occasional dark stools but not black. -He takes one iron tablet daily and 2 tablets on  Monday, Wednesday and Friday. -He is being planned to have colonoscopy and endoscopy by Dr. Gala Romney end of this month. -I have reviewed his labs.  Ferritin is 134 and percent saturation is 12.  Hemoglobin is 11.3.  Creatinine is 1.16. -He will continue oral iron therapy.  I will see him back in 3 months for follow-up with repeat labs.  We will also check ferritin, iron panel, 123456 and folic acid levels.  2.  Leukocytosis and thrombocytosis: -Work-up for myeloproliferative disorders was previously done and was negative.  3.  Hyponatremia: -Sodium is slightly low at 128.  He is on furosemide for  fluid overload.  He is also on salt restricted diet.        Orders placed this encounter:  Orders Placed This Encounter  Procedures  . CBC with Differential/Platelet  . Iron and TIBC  . Ferritin  . Vitamin B12  . Folate      Brett Jack, MD [place] [phone]

## 2019-11-21 ENCOUNTER — Ambulatory Visit: Payer: Medicare HMO | Admitting: Orthopaedic Surgery

## 2019-11-21 ENCOUNTER — Encounter: Payer: Self-pay | Admitting: Orthopaedic Surgery

## 2019-11-21 DIAGNOSIS — G5602 Carpal tunnel syndrome, left upper limb: Secondary | ICD-10-CM | POA: Diagnosis not present

## 2019-11-21 DIAGNOSIS — G5601 Carpal tunnel syndrome, right upper limb: Secondary | ICD-10-CM

## 2019-11-21 NOTE — Progress Notes (Signed)
Office Visit Note   Patient: Brett [name]           Date of Birth: 12/11/1950           MRN: [id number] Visit Date: 11/21/2019              Requested by: [name], MD [place] [address] [place],  [place] [address] PCP: [name], MD   Assessment & Plan: Visit Diagnoses:  1. Right carpal tunnel syndrome   2. Left carpal tunnel syndrome     Plan: Impression is bilateral carpal tunnel syndrome.  At this point we will obtain nerve conduction studies to fully assess the severity of his condition.  We will see him back after the nerve conduction studies.  Questions encouraged and answered.  Follow-Up Instructions: Return if symptoms worsen or fail to improve.   Orders:  No orders of the defined types were placed in this encounter.  No orders of the defined types were placed in this encounter.     Procedures: No procedures performed   Clinical Data: No additional findings.   Subjective: Chief Complaint  Patient presents with  . Right Hand - Pain  . Left Hand - Pain    Brett [name] is a 69 year old gentleman who comes in today with his wife for evaluation of suspected bilateral carpal tunnel syndrome.  He has numbness and tingling and burning pain that goes up and down the arm.  The pain wakes him up at night and is worse in the right hand.  He is having trouble tying his shoes due to decreased sensation in the fingertips.  He is dropping things out of his hands.  Braces have not helped with the symptoms.   Review of Systems  Constitutional: Negative.   All other systems reviewed and are negative.    Objective: Vital Signs: There were no vitals taken for this visit.  Physical Exam Vitals and nursing note reviewed.  Constitutional:      Appearance: He is well-developed.  HENT:     Head: Normocephalic and atraumatic.  Eyes:     Pupils: Pupils are equal, round, and reactive to light.  Pulmonary:     Effort: Pulmonary effort is normal.  Abdominal:   Palpations: Abdomen is soft.  Musculoskeletal:        General: Normal range of motion.     Cervical back: Neck supple.  Skin:    General: Skin is warm.  Neurological:     Mental Status: He is alert and oriented to person, place, and time.  Psychiatric:        Behavior: Behavior normal.        Thought Content: Thought content normal.        Judgment: Judgment normal.     Ortho Exam Bilateral hand exam shows no muscle atrophy.  Positive carpal tunnel compressive signs.  Normal sensation in the small finger.  Decreased sensation in the median nerve distribution. Specialty Comments:  No specialty comments available.  Imaging: No results found.   PMFS History: Patient Active Problem List   Diagnosis Date Noted  . History of adenomatous polyp of colon 10/21/2019  . Class 1 obesity due to excess calories with body mass index (BMI) of 32.0 to 32.9 in adult   . Acute hypoxemic respiratory failure ([place]) 07/09/2019  . Anemia   . Acute on chronic respiratory failure ([place]) 07/08/2019  . Acute respiratory failure with hypoxia ([place]) 05/19/2019  . Iron deficiency anemia 05/19/2019  . Leukocytosis  05/19/2019  . D-dimer, elevated 05/19/2019  . Dextrocardia   . Chewing tobacco nicotine dependence   . Constipation 07/19/2018  . 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 Chronic respiratory failure with hypoxia ([place]) 07/16/2018  . HTN (hypertension) 07/16/2018  . Acute on chronic respiratory failure with hypoxia ([place]) 04/21/2018  . COPD exacerbation ([place]) 04/21/2018  . SVT (supraventricular tachycardia) ([place]) 04/14/2018  . Paroxysmal SVT (supraventricular tachycardia) ([place]) 04/14/2018  . Chronic diastolic CHF (congestive heart failure) ([place]) 04/14/2018  . CHF exacerbation ([place]) 04/10/2018  . 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 Restrictive lung disease 11/08/2016  . 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 Moderate persistent asthma, uncomplicated 0000000  . Abdominal pain 09/23/2014  .  Gastritis, Helicobacter pylori [id number]  . Insulin-requiring or dependent type II diabetes mellitus ([place]) 01/03/2014  . 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 GERD (gastroesophageal reflux disease) 10/18/2012  . Nausea 10/18/2012  . Iron deficiency anemia due to chronic blood loss 10/18/2012  . HIP PAIN 08/31/2010  . SPINAL STENOSIS 06/28/2010  . TENDINITIS, CALCIFIC, SHOULDER, RIGHT 06/14/2010  . IMPINGEMENT SYNDROME 06/14/2010   Past Medical History:  Diagnosis Date  . Chronic diastolic heart failure ([place])   . COPD (chronic obstructive pulmonary disease) ([place])   . Depression   . Dextrocardia   . Diabetes mellitus   . GERD (gastroesophageal reflux disease)   . H. pylori infection 11/09/2012   treated with pylera  . HLD (hyperlipidemia)   . HTN (hypertension)    Cholesterol  . Iron deficiency anemia, unspecified 10/18/2012  . Neuropathy   . Pneumonia   . Situs inversus totalis   . Tachycardia     Family History  Problem Relation Age of Onset  . Heart attack Mother   . Diabetes Sister        Fx  . Heart defect Sister        Fx  . Arthritis Sister        Fx  . Asthma Sister        Fx  . Kidney disease Sister        Fx  . Pancreatitis Sister   . Colon cancer Neg Hx     Past Surgical History:  Procedure Laterality Date  . CATARACT EXTRACTION, BILATERAL  2016  . COLONOSCOPY WITH ESOPHAGOGASTRODUODENOSCOPY (EGD) N/A 11/09/2012   Dr. 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 hpylori on bx- treated with pylera. TCS- normal rectum. 1 diminutive polyp in the mid descending segment. 1-20mm polyp in the mid desending segment o/w the remainder of the colonic mucosa appeared normal. tubular adenoma on bx  . GIVENS CAPSULE STUDY N/A 10/07/2013   no source for anemia or heme positive stool noted  . NECK SURGERY     bone spurs   Social History   Occupational History  . Occupation: Biomedical scientist     Tobacco Use  . Smoking status: Never Smoker  . Smokeless tobacco: Current User    Types: Chew  Substance and Sexual Activity  . Alcohol use: No  . Drug use: No  . Sexual activity: Not on file

## 2019-11-22 ENCOUNTER — Other Ambulatory Visit: Payer: Self-pay

## 2019-11-22 DIAGNOSIS — G5602 Carpal tunnel syndrome, left upper limb: Secondary | ICD-10-CM

## 2019-11-22 DIAGNOSIS — G5601 Carpal tunnel syndrome, right upper limb: Secondary | ICD-10-CM

## 2019-12-12 ENCOUNTER — Ambulatory Visit (INDEPENDENT_AMBULATORY_CARE_PROVIDER_SITE_OTHER): Payer: Medicare HMO | Admitting: Physical Medicine and Rehabilitation

## 2019-12-12 ENCOUNTER — Other Ambulatory Visit: Payer: Self-pay

## 2019-12-12 ENCOUNTER — Encounter: Payer: Self-pay | Admitting: Physical Medicine and Rehabilitation

## 2019-12-12 DIAGNOSIS — R202 Paresthesia of skin: Secondary | ICD-10-CM | POA: Diagnosis not present

## 2019-12-12 NOTE — Progress Notes (Signed)
 .  Numeric Pain Rating Scale and Functional Assessment Average Pain 9   In the last MONTH (on 0-10 scale) has pain interfered with the following?  1. General activity like being  able to carry out your everyday physical activities such as walking, climbing stairs, carrying groceries, or moving a chair?  Rating(9)     

## 2019-12-12 NOTE — Progress Notes (Signed)
Brett [name] - 69 y.o. male MRN [id number]  Date of birth: 29-Apr-1951  Office Visit Note: Visit Date: 12/12/2019 PCP: [name], MD Referred by: [name], MD  Subjective: Chief Complaint  Patient presents with  . Right Forearm - Pain, Numbness, Tingling  . Left Forearm - Tingling, Pain, Numbness  . Right Hand - Tingling, Pain, Numbness  . Left Hand - Tingling, Pain, Numbness   HPI: Brett [name] is a 69 y.o. male who comes in today For electrodiagnostic study of both upper limbs as requested by Dr. Eduard Roux. Patient is right-hand dominant with a history of diabetes and peripheral polyneuropathy. This was diagnosed by Dr. Merlene Laughter a neurologist in [place]. Patient states he had some type of test on his legs with pins-and-needles. I do not have the electrodiagnostic test for review.  Patient does report pain in the right more than left hand significantly.  This is not symmetric.  Right hand with pain numbness and tingling radiating up into the forearms includes mainly the thumb index and middle finger.  The left hand does seem to be somewhat more nondermatomal into the fingertips.  He reports having symptoms over 30 years now of his hands and worse over the last 5 years.  He is at a point where he is dropping objects and having a lot of grip strength issues with the right hand.  He does use a cane for ambulation usually uses the right hand for that.  He rates his pain as a 9 out of 10.  He uses gabapentin and tramadol.  ROS Otherwise per HPI.  Assessment & Plan: Visit Diagnoses:  1. Paresthesia of skin     Plan: Impression: The above electrodiagnostic study is ABNORMAL and reveals evidence of:  1. A severe right median nerve entrapment at the wrist (carpal tunnel syndrome) affecting sensory and motor components. The lesion is characterized by sensory and motor demyelination with evidence of axonal injury.   2. A moderate left median nerve entrapment at the wrist (carpal tunnel  syndrome) affecting sensory and motor components.   3. Underlying mild sensory motor axonal and demyelinating polyneuropathy consistent with diabetic neuropathy.  There is no significant electrodiagnostic evidence of any other focal nerve entrapment, brachial plexopathy or cervical radiculopathy.   Recommendations: 1.  Follow-up with referring physician. 2.  Continue current management of symptoms. 3.  Suggest surgical evaluation.   Meds & Orders: No orders of the defined types were placed in this encounter.   Orders Placed This Encounter  Procedures  . NCV with EMG (electromyography)    Follow-up: Return for Eduard Roux, MD.   Procedures: No procedures performed  EMG & NCV Findings: Evaluation of the left median motor nerve showed prolonged distal onset latency (6.7 ms) and decreased conduction velocity (Elbow-Wrist, 40 m/s).  The right median motor nerve showed prolonged distal onset latency (10.1 ms), reduced amplitude (0.8 mV), and decreased conduction velocity (Elbow-Wrist, 38 m/s).  The left ulnar motor and the right ulnar motor nerves showed decreased conduction velocity (B Elbow-Wrist, L50, R48 m/s).  The left median (across palm) sensory nerve showed no response (Palm) and prolonged distal peak latency (6.6 ms).  The right median (across palm) sensory nerve showed no response (Wrist) and prolonged distal peak latency (Palm, 6.1 ms).  The left ulnar sensory nerve showed prolonged distal peak latency (4.2 ms) and decreased conduction velocity (Wrist-5th Digit, 33 m/s).  The right ulnar sensory nerve showed prolonged distal peak latency (4.4 ms), reduced amplitude (  13.2 V), and decreased conduction velocity (Wrist-5th Digit, 32 m/s).  All remaining nerves (as indicated in the following tables) were within normal limits.  Left vs. Right side comparison data for the median motor nerve indicates abnormal L-R latency difference (3.4 ms) and abnormal L-R amplitude difference (89.3 %).  All  remaining left vs. right side differences were within normal limits.    Needle evaluation of the right abductor pollicis brevis muscle showed increased insertional activity, increased spontaneous activity, and diminished recruitment.  All remaining muscles (as indicated in the following table) showed no evidence of electrical instability.    Impression: The above electrodiagnostic study is ABNORMAL and reveals evidence of:  1. A severe right median nerve entrapment at the wrist (carpal tunnel syndrome) affecting sensory and motor components. The lesion is characterized by sensory and motor demyelination with evidence of axonal injury.   2. A moderate left median nerve entrapment at the wrist (carpal tunnel syndrome) affecting sensory and motor components.   3. Underlying mild sensory motor axonal and demyelinating polyneuropathy consistent with diabetic neuropathy.  There is no significant electrodiagnostic evidence of any other focal nerve entrapment, brachial plexopathy or cervical radiculopathy.   Recommendations: 1.  Follow-up with referring physician. 2.  Continue current management of symptoms. 3.  Suggest surgical evaluation.  ___________________________ Laurence Spates FAAPMR Board Certified, American Board of Physical Medicine and Rehabilitation    Nerve Conduction Studies Anti Sensory Summary Table   Stim Site NR Peak (ms) Norm Peak (ms) P-T Amp (V) Norm P-T Amp Site1 Site2 Delta-P (ms) Dist (cm) Vel (m/s) Norm Vel (m/s)  Left Median Acr Palm Anti Sensory (2nd Digit)  32C  Wrist    *6.6 <3.6 15.2 >10 Wrist Palm  0.0    Palm *NR  <2.0          Right Median Acr Palm Anti Sensory (2nd Digit)  33.1C  Wrist *NR  <3.6  >10 Wrist Palm  0.0    Palm    *6.1 <2.0 1.9         Left Radial Anti Sensory (Base 1st Digit)  33.1C  Wrist    2.7 <3.1 19.1  Wrist Base 1st Digit 2.7 0.0    Right Radial Anti Sensory (Base 1st Digit)  34.5C  Wrist    2.8 <3.1 9.3  Wrist Base 1st Digit 2.8  0.0    Left Ulnar Anti Sensory (5th Digit)  32.3C  Wrist    *4.2 <3.7 16.2 >15.0 Wrist 5th Digit 4.2 14.0 *33 >38  Right Ulnar Anti Sensory (5th Digit)  33.8C  Wrist    *4.4 <3.7 *13.2 >15.0 Wrist 5th Digit 4.4 14.0 *32 >38   Motor Summary Table   Stim Site NR Onset (ms) Norm Onset (ms) O-P Amp (mV) Norm O-P Amp Site1 Site2 Delta-0 (ms) Dist (cm) Vel (m/s) Norm Vel (m/s)  Left Median Motor (Abd Poll Brev)  33.3C  Wrist    *6.7 <4.2 7.5 >5 Elbow Wrist 5.5 22.0 *40 >50  Elbow    12.2  7.0         Right Median Motor (Abd Poll Brev)  34.2C  Wrist    *10.1 <4.2 *0.8 >5 Elbow Wrist 5.7 21.5 *38 >50  Elbow    15.8  0.6         Left Ulnar Motor (Abd Dig Min)  33.1C  Wrist    3.6 <4.2 9.0 >3 B Elbow Wrist 4.0 20.0 *50 >53  B Elbow    7.6  7.9  A Elbow B Elbow 1.7 9.5 56 >53  A Elbow    9.3  8.2         Right Ulnar Motor (Abd Dig Min)  34.3C  Wrist    3.4 <4.2 10.2 >3 B Elbow Wrist 4.4 21.0 *48 >53  B Elbow    7.8  8.3  A Elbow B Elbow 1.7 10.0 59 >53  A Elbow    9.5  9.6          EMG   Side Muscle Nerve Root Ins Act Fibs Psw Amp Dur Poly Recrt Int Fraser Din Comment  Right Abd Poll Brev Median C8-T1 *Incr *3+ *3+ Nml Nml 0 *Reduced Nml   Right 1stDorInt Ulnar C8-T1 Nml Nml Nml Nml Nml 0 Nml Nml     Nerve Conduction Studies Anti Sensory Left/Right Comparison   Stim Site L Lat (ms) R Lat (ms) L-R Lat (ms) L Amp (V) R Amp (V) L-R Amp (%) Site1 Site2 L Vel (m/s) R Vel (m/s) L-R Vel (m/s)  Median Acr Palm Anti Sensory (2nd Digit)  32C  Wrist *6.6   15.2   Wrist Palm     Palm  *6.1   1.9        Radial Anti Sensory (Base 1st Digit)  33.1C  Wrist 2.7 2.8 0.1 19.1 9.3 51.3 Wrist Base 1st Digit     Ulnar Anti Sensory (5th Digit)  32.3C  Wrist *4.2 *4.4 0.2 16.2 *13.2 18.5 Wrist 5th Digit *33 *32 1   Motor Left/Right Comparison   Stim Site L Lat (ms) R Lat (ms) L-R Lat (ms) L Amp (mV) R Amp (mV) L-R Amp (%) Site1 Site2 L Vel (m/s) R Vel (m/s) L-R Vel (m/s)  Median Motor (Abd Poll Brev)   33.3C  Wrist *6.7 *10.1 *3.4 7.5 *0.8 *89.3 Elbow Wrist *40 *38 2  Elbow 12.2 15.8 3.6 7.0 0.6 91.4       Ulnar Motor (Abd Dig Min)  33.1C  Wrist 3.6 3.4 0.2 9.0 10.2 11.8 B Elbow Wrist *50 *48 2  B Elbow 7.6 7.8 0.2 7.9 8.3 4.8 A Elbow B Elbow 56 59 3  A Elbow 9.3 9.5 0.2 8.2 9.6 14.6          Waveforms:                      Clinical History: No specialty comments available.   He reports that he has never smoked. His smokeless tobacco use includes chew.  Recent Labs    05/20/19 0518  HGBA1C 6.4*    Objective:  VS:  HT:    WT:   BMI:     BP:   HR: bpm  TEMP: ( )  RESP:  Physical Exam Musculoskeletal:        General: No tenderness.     Comments: Inspection reveals flattening and atrophy of the right APB but no atrophy of the APB or bilateral  FDI or hand intrinsics. There is no swelling, color changes, allodynia or dystrophic changes. There is 5 out of 5 strength in the bilateral wrist extension, finger abduction and long finger flexion.  There is decreased sensation in the right median nerve distribution.  There is a negative Hoffmann's test bilaterally.  Skin:    General: Skin is warm and dry.     Findings: No erythema or rash.  Neurological:     General: No focal deficit present.     Mental Status: He is alert and  oriented to person, place, and time.     Sensory: No sensory deficit.     Motor: No weakness or abnormal muscle tone.     Coordination: Coordination normal.     Gait: Gait normal.  Psychiatric:        Mood and Affect: Mood normal.        Behavior: Behavior normal.        Thought Content: Thought content normal.     Ortho Exam Imaging: No results found.  Past Medical/Family/Surgical/Social History: Medications & Allergies reviewed per EMR, new medications updated. Patient Active Problem List   Diagnosis Date Noted  . History of adenomatous polyp of colon 10/21/2019  . Class 1 obesity due to excess calories with body mass index (BMI) of  32.0 to 32.9 in adult   . Acute hypoxemic respiratory failure ([place]) 07/09/2019  . Anemia   . Acute on chronic respiratory failure ([place]) 07/08/2019  . Acute respiratory failure with hypoxia ([place]) 05/19/2019  . Iron deficiency anemia 05/19/2019  . Leukocytosis 05/19/2019  . D-dimer, elevated 05/19/2019  . Dextrocardia   . Chewing tobacco nicotine dependence   . Constipation 07/19/2018  . 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 Chronic respiratory failure with hypoxia ([place]) 07/16/2018  . HTN (hypertension) 07/16/2018  . Acute on chronic respiratory failure with hypoxia ([place]) 04/21/2018  . COPD exacerbation ([place]) 04/21/2018  . SVT (supraventricular tachycardia) ([place]) 04/14/2018  . Paroxysmal SVT (supraventricular tachycardia) ([place]) 04/14/2018  . Chronic diastolic CHF (congestive heart failure) ([place]) 04/14/2018  . CHF exacerbation ([place]) 04/10/2018  . 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 Restrictive lung disease 11/08/2016  . 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 Moderate persistent asthma, uncomplicated 0000000  . Abdominal pain 09/23/2014  . Gastritis, Helicobacter pylori [id number]  . 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 GERD (gastroesophageal reflux disease) 10/18/2012  . Nausea 10/18/2012  . Iron deficiency anemia due to chronic blood loss 10/18/2012  . HIP PAIN 08/31/2010  . SPINAL STENOSIS 06/28/2010  . TENDINITIS, CALCIFIC, SHOULDER, RIGHT 06/14/2010  . IMPINGEMENT SYNDROME 06/14/2010   Past Medical History:  Diagnosis Date  . Chronic diastolic heart failure ([place])   . COPD (chronic obstructive pulmonary disease) ([place])   . Depression   . Dextrocardia   . Diabetes mellitus   . GERD (gastroesophageal reflux disease)   . H. pylori infection 11/09/2012   treated with pylera  . HLD (hyperlipidemia)   . HTN (hypertension)    Cholesterol  . Iron deficiency anemia, unspecified 10/18/2012  . Neuropathy    . Pneumonia   . Situs inversus totalis   . Tachycardia    Family History  Problem Relation Age of Onset  . Heart attack Mother   . Diabetes Sister        Fx  . Heart defect Sister        Fx  . Arthritis Sister        Fx  . Asthma Sister        Fx  . Kidney disease Sister        Fx  . Pancreatitis Sister   . Colon cancer Neg Hx    Past Surgical History:  Procedure Laterality Date  . CATARACT EXTRACTION, BILATERAL  2016  . COLONOSCOPY WITH ESOPHAGOGASTRODUODENOSCOPY (EGD) N/A 11/09/2012   Dr. 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 hpylori on bx- treated with pylera. TCS- normal rectum. 1 diminutive  polyp in the mid descending segment. 1-52mm polyp in the mid desending segment o/w the remainder of the colonic mucosa appeared normal. tubular adenoma on bx  . GIVENS CAPSULE STUDY N/A 10/07/2013   no source for anemia or heme positive stool noted  . NECK SURGERY     bone spurs   Social History   Occupational History  . Occupation: Biomedical scientist   Tobacco Use  . Smoking status: Never Smoker  . Smokeless tobacco: Current User    Types: Chew  Substance and Sexual Activity  . Alcohol use: No  . Drug use: No  . Sexual activity: Not on file

## 2019-12-12 NOTE — Procedures (Signed)
EMG & NCV Findings: Evaluation of the left median motor nerve showed prolonged distal onset latency (6.7 ms) and decreased conduction velocity (Elbow-Wrist, 40 m/s).  The right median motor nerve showed prolonged distal onset latency (10.1 ms), reduced amplitude (0.8 mV), and decreased conduction velocity (Elbow-Wrist, 38 m/s).  The left ulnar motor and the right ulnar motor nerves showed decreased conduction velocity (B Elbow-Wrist, L50, R48 m/s).  The left median (across palm) sensory nerve showed no response (Palm) and prolonged distal peak latency (6.6 ms).  The right median (across palm) sensory nerve showed no response (Wrist) and prolonged distal peak latency (Palm, 6.1 ms).  The left ulnar sensory nerve showed prolonged distal peak latency (4.2 ms) and decreased conduction velocity (Wrist-5th Digit, 33 m/s).  The right ulnar sensory nerve showed prolonged distal peak latency (4.4 ms), reduced amplitude (13.2 V), and decreased conduction velocity (Wrist-5th Digit, 32 m/s).  All remaining nerves (as indicated in the following tables) were within normal limits.  Left vs. Right side comparison data for the median motor nerve indicates abnormal L-R latency difference (3.4 ms) and abnormal L-R amplitude difference (89.3 %).  All remaining left vs. right side differences were within normal limits.    Needle evaluation of the right abductor pollicis brevis muscle showed increased insertional activity, increased spontaneous activity, and diminished recruitment.  All remaining muscles (as indicated in the following table) showed no evidence of electrical instability.    Impression: The above electrodiagnostic study is ABNORMAL and reveals evidence of:  1. A severe right median nerve entrapment at the wrist (carpal tunnel syndrome) affecting sensory and motor components. The lesion is characterized by sensory and motor demyelination with evidence of axonal injury.   2. A moderate left median nerve entrapment  at the wrist (carpal tunnel syndrome) affecting sensory and motor components.   3. Underlying mild sensory motor axonal and demyelinating polyneuropathy consistent with diabetic neuropathy.  There is no significant electrodiagnostic evidence of any other focal nerve entrapment, brachial plexopathy or cervical radiculopathy.   Recommendations: 1.  Follow-up with referring physician. 2.  Continue current management of symptoms. 3.  Suggest surgical evaluation.  ___________________________ Laurence Spates FAAPMR Board Certified, American Board of Physical Medicine and Rehabilitation    Nerve Conduction Studies Anti Sensory Summary Table   Stim Site NR Peak (ms) Norm Peak (ms) P-T Amp (V) Norm P-T Amp Site1 Site2 Delta-P (ms) Dist (cm) Vel (m/s) Norm Vel (m/s)  Left Median Acr Palm Anti Sensory (2nd Digit)  32C  Wrist    *6.6 <3.6 15.2 >10 Wrist Palm  0.0    Palm *NR  <2.0          Right Median Acr Palm Anti Sensory (2nd Digit)  33.1C  Wrist *NR  <3.6  >10 Wrist Palm  0.0    Palm    *6.1 <2.0 1.9         Left Radial Anti Sensory (Base 1st Digit)  33.1C  Wrist    2.7 <3.1 19.1  Wrist Base 1st Digit 2.7 0.0    Right Radial Anti Sensory (Base 1st Digit)  34.5C  Wrist    2.8 <3.1 9.3  Wrist Base 1st Digit 2.8 0.0    Left Ulnar Anti Sensory (5th Digit)  32.3C  Wrist    *4.2 <3.7 16.2 >15.0 Wrist 5th Digit 4.2 14.0 *33 >38  Right Ulnar Anti Sensory (5th Digit)  33.8C  Wrist    *4.4 <3.7 *13.2 >15.0 Wrist 5th Digit 4.4 14.0 *32 >38  Motor Summary Table   Stim Site NR Onset (ms) Norm Onset (ms) O-P Amp (mV) Norm O-P Amp Site1 Site2 Delta-0 (ms) Dist (cm) Vel (m/s) Norm Vel (m/s)  Left Median Motor (Abd Poll Brev)  33.3C  Wrist    *6.7 <4.2 7.5 >5 Elbow Wrist 5.5 22.0 *40 >50  Elbow    12.2  7.0         Right Median Motor (Abd Poll Brev)  34.2C  Wrist    *10.1 <4.2 *0.8 >5 Elbow Wrist 5.7 21.5 *38 >50  Elbow    15.8  0.6         Left Ulnar Motor (Abd Dig Min)  33.1C  Wrist     3.6 <4.2 9.0 >3 B Elbow Wrist 4.0 20.0 *50 >53  B Elbow    7.6  7.9  A Elbow B Elbow 1.7 9.5 56 >53  A Elbow    9.3  8.2         Right Ulnar Motor (Abd Dig Min)  34.3C  Wrist    3.4 <4.2 10.2 >3 B Elbow Wrist 4.4 21.0 *48 >53  B Elbow    7.8  8.3  A Elbow B Elbow 1.7 10.0 59 >53  A Elbow    9.5  9.6          EMG   Side Muscle Nerve Root Ins Act Fibs Psw Amp Dur Poly Recrt Int Fraser Din Comment  Right Abd Poll Brev Median C8-T1 *Incr *3+ *3+ Nml Nml 0 *Reduced Nml   Right 1stDorInt Ulnar C8-T1 Nml Nml Nml Nml Nml 0 Nml Nml     Nerve Conduction Studies Anti Sensory Left/Right Comparison   Stim Site L Lat (ms) R Lat (ms) L-R Lat (ms) L Amp (V) R Amp (V) L-R Amp (%) Site1 Site2 L Vel (m/s) R Vel (m/s) L-R Vel (m/s)  Median Acr Palm Anti Sensory (2nd Digit)  32C  Wrist *6.6   15.2   Wrist Palm     Palm  *6.1   1.9        Radial Anti Sensory (Base 1st Digit)  33.1C  Wrist 2.7 2.8 0.1 19.1 9.3 51.3 Wrist Base 1st Digit     Ulnar Anti Sensory (5th Digit)  32.3C  Wrist *4.2 *4.4 0.2 16.2 *13.2 18.5 Wrist 5th Digit *33 *32 1   Motor Left/Right Comparison   Stim Site L Lat (ms) R Lat (ms) L-R Lat (ms) L Amp (mV) R Amp (mV) L-R Amp (%) Site1 Site2 L Vel (m/s) R Vel (m/s) L-R Vel (m/s)  Median Motor (Abd Poll Brev)  33.3C  Wrist *6.7 *10.1 *3.4 7.5 *0.8 *89.3 Elbow Wrist *40 *38 2  Elbow 12.2 15.8 3.6 7.0 0.6 91.4       Ulnar Motor (Abd Dig Min)  33.1C  Wrist 3.6 3.4 0.2 9.0 10.2 11.8 B Elbow Wrist *50 *48 2  B Elbow 7.6 7.8 0.2 7.9 8.3 4.8 A Elbow B Elbow 56 59 3  A Elbow 9.3 9.5 0.2 8.2 9.6 14.6          Waveforms:

## 2019-12-13 ENCOUNTER — Other Ambulatory Visit: Payer: Self-pay | Admitting: Cardiology

## 2019-12-17 ENCOUNTER — Other Ambulatory Visit: Payer: Self-pay | Admitting: Gastroenterology

## 2019-12-17 DIAGNOSIS — K5900 Constipation, unspecified: Secondary | ICD-10-CM

## 2019-12-20 ENCOUNTER — Telehealth: Payer: Self-pay

## 2019-12-20 ENCOUNTER — Ambulatory Visit: Payer: Medicare HMO | Admitting: Orthopaedic Surgery

## 2019-12-20 ENCOUNTER — Other Ambulatory Visit: Payer: Self-pay

## 2019-12-20 ENCOUNTER — Encounter: Payer: Self-pay | Admitting: Orthopaedic Surgery

## 2019-12-20 DIAGNOSIS — G5601 Carpal tunnel syndrome, right upper limb: Secondary | ICD-10-CM | POA: Insufficient documentation

## 2019-12-20 DIAGNOSIS — G5602 Carpal tunnel syndrome, left upper limb: Secondary | ICD-10-CM

## 2019-12-20 NOTE — Telephone Encounter (Signed)
Called [name], PCP and tried to get through multiple times. No answer each time. I left message asking the PCP office to call us back with Mr.Thorley's most recent A1C results. Dr.Xu is scheduling patient for surgery and needs to know.

## 2019-12-20 NOTE — Progress Notes (Signed)
Office Visit Note   Patient: Brett [name]           Date of Birth: December 05, 1950           MRN: [id number] Visit Date: 12/20/2019              Requested by: [name], [address] [place],  [place] [address] PCP: [name], MD   Assessment & Plan: Visit Diagnoses:  1. Right carpal tunnel syndrome   2. Left carpal tunnel syndrome     Plan: Nerve conduction studies show severe right carpal tunnel syndrome and moderate left carpal tunnel syndrome.  These results were reviewed with the patient in detail and recommendation is for surgical release of the right hand first.  Risk benefits rehab recovery were discussed with the patient.  He recently had an A1c level obtained at his PCP office so we will get those results prior to scheduling.  Follow-Up Instructions: Return if symptoms worsen or fail to improve.   Orders:  No orders of the defined types were placed in this encounter.  No orders of the defined types were placed in this encounter.     Procedures: No procedures performed   Clinical Data: No additional findings.   Subjective: Chief Complaint  Patient presents with  . Left Hand - Follow-up  . Right Hand - Follow-up    Brett [name] returns today for nerve conduction study review.  No changes in medical history.   Review of Systems   Objective: Vital Signs: There were no vitals taken for this visit.  Physical Exam  Ortho Exam Of bilateral hand exams are unchanged. Specialty Comments:  No specialty comments available.  Imaging: No results found.   PMFS History: Patient Active Problem List   Diagnosis Date Noted  . Right carpal tunnel syndrome 12/20/2019  . Left carpal tunnel syndrome 12/20/2019  . History of adenomatous polyp of colon 10/21/2019  . Class 1 obesity due to excess calories with body mass index (BMI) of 32.0 to 32.9 in adult   . Acute hypoxemic respiratory failure ([place]) 07/09/2019  . Anemia   . Acute on chronic  respiratory failure ([place]) 07/08/2019  . Acute respiratory failure with hypoxia ([place]) 05/19/2019  . Iron deficiency anemia 05/19/2019  . Leukocytosis 05/19/2019  . D-dimer, elevated 05/19/2019  . Dextrocardia   . Chewing tobacco nicotine dependence   . Constipation 07/19/2018  . 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 Chronic respiratory failure with hypoxia ([place]) 07/16/2018  . HTN (hypertension) 07/16/2018  . Acute on chronic respiratory failure with hypoxia ([place]) 04/21/2018  . COPD exacerbation ([place]) 04/21/2018  . SVT (supraventricular tachycardia) ([place]) 04/14/2018  . Paroxysmal SVT (supraventricular tachycardia) ([place]) 04/14/2018  . Chronic diastolic CHF (congestive heart failure) ([place]) 04/14/2018  . CHF exacerbation ([place]) 04/10/2018  . 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 Restrictive lung disease 11/08/2016  . 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 Moderate persistent asthma, uncomplicated 0000000  . Abdominal pain 09/23/2014  . Gastritis, Helicobacter pylori [id number]  . Insulin-requiring or dependent type II diabetes mellitus ([place]) 01/03/2014  . 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 GERD (gastroesophageal reflux disease) 10/18/2012  . Nausea 10/18/2012  . Iron deficiency anemia due to chronic blood loss 10/18/2012  . HIP PAIN 08/31/2010  . SPINAL STENOSIS 06/28/2010  . TENDINITIS, CALCIFIC, SHOULDER, RIGHT 06/14/2010  . IMPINGEMENT SYNDROME 06/14/2010   Past Medical History:  Diagnosis Date  . Chronic diastolic heart failure ([place])   . COPD (chronic obstructive pulmonary  disease) ([place])   . Depression   . Dextrocardia   . Diabetes mellitus   . GERD (gastroesophageal reflux disease)   . H. pylori infection 11/09/2012   treated with pylera  . HLD (hyperlipidemia)   . HTN (hypertension)    Cholesterol  . Iron deficiency anemia, unspecified 10/18/2012  . Neuropathy   . Pneumonia   . Situs inversus totalis   . Tachycardia     Family History  Problem Relation Age of Onset    . Heart attack Mother   . Diabetes Sister        Fx  . Heart defect Sister        Fx  . Arthritis Sister        Fx  . Asthma Sister        Fx  . Kidney disease Sister        Fx  . Pancreatitis Sister   . Colon cancer Neg Hx     Past Surgical History:  Procedure Laterality Date  . CATARACT EXTRACTION, BILATERAL  2016  . COLONOSCOPY WITH ESOPHAGOGASTRODUODENOSCOPY (EGD) N/A 11/09/2012   Dr. 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 hpylori on bx- treated with pylera. TCS- normal rectum. 1 diminutive polyp in the mid descending segment. 1-41mm polyp in the mid desending segment o/w the remainder of the colonic mucosa appeared normal. tubular adenoma on bx  . GIVENS CAPSULE STUDY N/A 10/07/2013   no source for anemia or heme positive stool noted  . NECK SURGERY     bone spurs   Social History   Occupational History  . Occupation: Biomedical scientist   Tobacco Use  . Smoking status: Never Smoker  . Smokeless tobacco: Current User    Types: Chew  Substance and Sexual Activity  . Alcohol use: No  . Drug use: No  . Sexual activity: Not on file

## 2019-12-23 ENCOUNTER — Telehealth: Payer: Self-pay | Admitting: Orthopedic Surgery

## 2019-12-23 NOTE — Telephone Encounter (Signed)
Kathlee Nations with [facility] called stating she was returning [place] call regarding the pt.   [phone]

## 2019-12-23 NOTE — Telephone Encounter (Signed)
I see where you had been trying to call to get information.

## 2019-12-23 NOTE — Telephone Encounter (Signed)
Spoke with Kathlee Nations at patient's PCP office.  His last A1C was 6.4 on March 24th, 2021.

## 2019-12-23 NOTE — Telephone Encounter (Signed)
Ok sounds good.  Thanks.  When is he scheduled?

## 2019-12-31 ENCOUNTER — Telehealth: Payer: Self-pay | Admitting: Orthopaedic Surgery

## 2019-12-31 NOTE — Telephone Encounter (Signed)
Can you give him a call please.  Thanks.

## 2019-12-31 NOTE — Telephone Encounter (Signed)
Pt called in stating he was supposed to get scheduled for surgery and then never heard anything; pt would like a call back with an explanation for what's going on.   [phone]

## 2019-12-31 NOTE — Telephone Encounter (Signed)
I called and no answer.  Voice mail not set up.

## 2020-01-03 NOTE — Patient Instructions (Addendum)
Your procedure is scheduled on: 01/09/2020  Report to [facility] at  6:45   AM.  Call this number if you have problems the morning of surgery: [phone]   Remember:              Follow Directions on the letter you received from Your Physician's office regarding the Bowel Prep              No Smoking the day of Procedure :   Take these medicines the morning of surgery with A SIP OF WATER: Symbicort, Cymbalta, gabapentin, losartan, metoprolol, and tramadol             Take only 1/2 dose of Levemir insulin 30 units the night before procedure.  No diabetic medication morning of procedure.   Do not wear jewelry, make-up or nail polish.    Do not bring valuables to the hospital.  Contacts, dentures or bridgework may not be worn into surgery.  .   Patients discharged the day of surgery will not be allowed to drive home.     Colonoscopy, Adult, Care After This sheet gives you information about how to care for yourself after your procedure. Your health care provider may also give you more specific instructions. If you have problems or questions, contact your health care provider. What can I expect after the procedure? After the procedure, it is common to have:  A small amount of blood in your stool for 24 hours after the procedure.  Some gas.  Mild abdominal cramping or bloating.  Follow these instructions at home: General instructions   For the first 24 hours after the procedure: ? Do not drive or use machinery. ? Do not sign important documents. ? Do not drink alcohol. ? Do your regular daily activities at a slower pace than normal. ? Eat soft, easy-to-digest foods. ? Rest often.  Take over-the-counter or prescription medicines only as told by your health care provider.  It is up to you to get the results of your procedure. Ask your health care provider, or the department performing the procedure, when your results will be ready. Relieving cramping and bloating  Try  walking around when you have cramps or feel bloated.  Apply heat to your abdomen as told by your health care provider. Use a heat source that your health care provider recommends, such as a moist heat pack or a heating pad. ? Place a towel between your skin and the heat source. ? Leave the heat on for 20-30 minutes. ? Remove the heat if your skin turns bright red. This is especially important if you are unable to feel pain, heat, or cold. You may have a greater risk of getting burned. Eating and drinking  Drink enough fluid to keep your urine clear or pale yellow.  Resume your normal diet as instructed by your health care provider. Avoid heavy or fried foods that are hard to digest.  Avoid drinking alcohol for as long as instructed by your health care provider. Contact a health care provider if:  You have blood in your stool 2-3 days after the procedure. Get help right away if:  You have more than a small spotting of blood in your stool.  You pass large blood clots in your stool.  Your abdomen is swollen.  You have nausea or vomiting.  You have a fever.  You have increasing abdominal pain that is not relieved with medicine. This information is not intended to replace advice given  to you by your health care provider. Make sure you discuss any questions you have with your health care provider. Document Released: 04/12/2004 Document Revised: 05/23/2016 Document Reviewed: 11/10/2015 Elsevier Interactive Patient Education  2018 [place] [facility], Adult, Care After This sheet gives you information about how to care for yourself after your procedure. Your health care provider may also give you more specific instructions. If you have problems or questions, contact your health care provider. What can I expect after the procedure? After the procedure, it is common to have:  A sore throat.  Mild stomach pain or discomfort.  Bloating.  Nausea. Follow these instructions  at home:   Follow instructions from your health care provider about what to eat or drink after your procedure.  Return to your normal activities as told by your health care provider. Ask your health care provider what activities are safe for you.  Take over-the-counter and prescription medicines only as told by your health care provider.  Do not drive for 24 hours if you were given a sedative during your procedure.  Keep all follow-up visits as told by your health care provider. This is important. Contact a health care provider if you have:  A sore throat that lasts longer than one day.  Trouble swallowing. Get help right away if:  You vomit blood or your vomit looks like coffee grounds.  You have: ? A fever. ? Bloody, black, or tarry stools. ? A severe sore throat or you cannot swallow. ? Difficulty breathing. ? Severe pain in your chest or abdomen. Summary  After the procedure, it is common to have a sore throat, mild stomach discomfort, bloating, and nausea.  Do not drive for 24 hours if you were given a sedative during the procedure.  Follow instructions from your health care provider about what to eat or drink after your procedure.  Return to your normal activities as told by your health care provider. This information is not intended to replace advice given to you by your health care provider. Make sure you discuss any questions you have with your health care provider. Document Revised: 02/20/2018 Document Reviewed: 01/29/2018 Elsevier Patient Education  2020 [place] After These instructions provide you with information about caring for yourself after your procedure. Your health care provider may also give you more specific instructions. Your treatment has been planned according to current medical practices, but problems sometimes occur. Call your health care provider if you have any problems or questions after your  procedure. What can I expect after the procedure? After your procedure, you may:  Feel sleepy for several hours.  Feel clumsy and have poor balance for several hours.  Feel forgetful about what happened after the procedure.  Have poor judgment for several hours.  Feel nauseous or vomit.  Have a sore throat if you had a breathing tube during the procedure. Follow these instructions at home: For at least 24 hours after the procedure:      Have a responsible adult stay with you. It is important to have someone help care for you until you are awake and alert.  Rest as needed.  Do not: ? Participate in activities in which you could fall or become injured. ? Drive. ? Use heavy machinery. ? Drink alcohol. ? Take sleeping pills or medicines that cause drowsiness. ? Make important decisions or sign legal documents. ? Take care of children on your own. Eating and drinking  Follow the  diet that is recommended by your health care provider.  If you vomit, drink water, juice, or soup when you can drink without vomiting.  Make sure you have little or no nausea before eating solid foods. General instructions  Take over-the-counter and prescription medicines only as told by your health care provider.  If you have sleep apnea, surgery and certain medicines can increase your risk for breathing problems. Follow instructions from your health care provider about wearing your sleep device: ? Anytime you are sleeping, including during daytime naps. ? While taking prescription pain medicines, sleeping medicines, or medicines that make you drowsy.  If you smoke, do not smoke without supervision.  Keep all follow-up visits as told by your health care provider. This is important. Contact a health care provider if:  You keep feeling nauseous or you keep vomiting.  You feel light-headed.  You develop a rash.  You have a fever. Get help right away if:  You have trouble  breathing. Summary  For several hours after your procedure, you may feel sleepy and have poor judgment.  Have a responsible adult stay with you for at least 24 hours or until you are awake and alert. This information is not intended to replace advice given to you by your health care provider. Make sure you discuss any questions you have with your health care provider. Document Revised: 11/27/2017 Document Reviewed: 12/20/2015 Elsevier Patient Education  [place].

## 2020-01-04 LAB — CBC WITH DIFFERENTIAL/PLATELET
Basophils Absolute: 0 10*3/uL (ref 0.0–0.2)
Basos: 0 %
EOS (ABSOLUTE): 0.1 10*3/uL (ref 0.0–0.4)
Eos: 1 %
Hematocrit: 37.6 % (ref 37.5–51.0)
Hemoglobin: 12.2 g/dL — ABNORMAL LOW (ref 13.0–17.7)
Immature Grans (Abs): 0 10*3/uL (ref 0.0–0.1)
Immature Granulocytes: 0 %
Lymphocytes Absolute: 1.9 10*3/uL (ref 0.7–3.1)
Lymphs: 20 %
MCH: 29.5 pg (ref 26.6–33.0)
MCHC: 32.4 g/dL (ref 31.5–35.7)
MCV: 91 fL (ref 79–97)
Monocytes Absolute: 0.8 10*3/uL (ref 0.1–0.9)
Monocytes: 8 %
Neutrophils Absolute: 6.8 10*3/uL (ref 1.4–7.0)
Neutrophils: 71 %
Platelets: 434 10*3/uL (ref 150–450)
RBC: 4.14 x10E6/uL (ref 4.14–5.80)
RDW: 12.9 % (ref 11.6–15.4)
WBC: 9.7 10*3/uL (ref 3.4–10.8)

## 2020-01-05 NOTE — Progress Notes (Signed)
Hemoglobin is still slightly low at 12.2 but has improved significantly over the last 5 months.   He should proceed with procedures as planned and keep his follow-up with hematology. We will also follow-up in office after TCS as planned.

## 2020-01-07 ENCOUNTER — Encounter (HOSPITAL_COMMUNITY): Payer: Self-pay

## 2020-01-07 ENCOUNTER — Other Ambulatory Visit (HOSPITAL_COMMUNITY): Payer: Medicare HMO

## 2020-01-07 ENCOUNTER — Other Ambulatory Visit: Payer: Self-pay

## 2020-01-07 ENCOUNTER — Encounter (HOSPITAL_COMMUNITY)
Admission: RE | Admit: 2020-01-07 | Discharge: 2020-01-07 | Disposition: A | Discharge status: Home / Self Care | Payer: Medicare HMO | Source: Ambulatory Visit | Attending: Internal Medicine | Admitting: Internal Medicine

## 2020-01-07 ENCOUNTER — Other Ambulatory Visit (HOSPITAL_COMMUNITY)
Admission: RE | Admit: 2020-01-07 | Discharge: 2020-01-07 | Disposition: A | Discharge status: Home / Self Care | Payer: Medicare HMO | Source: Ambulatory Visit | Attending: Internal Medicine | Admitting: Internal Medicine

## 2020-01-07 DIAGNOSIS — Z01812 Encounter for preprocedural laboratory examination: Secondary | ICD-10-CM | POA: Diagnosis present

## 2020-01-07 DIAGNOSIS — Z20822 Contact with and (suspected) exposure to covid-19: Secondary | ICD-10-CM | POA: Diagnosis not present

## 2020-01-07 LAB — BASIC METABOLIC PANEL
Anion gap: 11 (ref 5–15)
BUN: 15 mg/dL (ref 8–23)
CO2: 25 mmol/L (ref 22–32)
Calcium: 9.5 mg/dL (ref 8.9–10.3)
Chloride: 98 mmol/L (ref 98–111)
Creatinine, Ser: 1.02 mg/dL (ref 0.61–1.24)
GFR calc Af Amer: >60 mL/min (ref >60)
GFR calc non Af Amer: >60 mL/min (ref >60)
Glucose, Bld: 93 mg/dL (ref 70–99)
Potassium: 4 mmol/L (ref 3.5–5.1)
Sodium: 134 mmol/L — ABNORMAL LOW (ref 135–145)

## 2020-01-07 LAB — CBC
HCT: 35.3 % — ABNORMAL LOW (ref 39.0–52.0)
Hemoglobin: 11.1 g/dL — ABNORMAL LOW (ref 13.0–17.0)
MCH: 29.5 pg (ref 26.0–34.0)
MCHC: 31.4 g/dL (ref 30.0–36.0)
MCV: 93.9 fL (ref 80.0–100.0)
Platelets: 377 10*3/uL (ref 150–400)
RBC: 3.76 MIL/uL — ABNORMAL LOW (ref 4.22–5.81)
RDW: 13.7 % (ref 11.5–15.5)
WBC: 8.2 10*3/uL (ref 4.0–10.5)
nRBC: 0 % (ref 0.0–0.2)

## 2020-01-07 NOTE — Pre-Procedure Instructions (Signed)
Patient does "not understand why I can't drive myself home or ride in a cab, I've done it before". Explained policy to patient who wanted to be argumentative and I explained if he did not have someone to drive him home or ride with him home, we would not be able to do his procedure. He reluctantly states he would have someone with him,"but I dont know who yet".

## 2020-01-07 NOTE — Telephone Encounter (Signed)
I called number listed again and no answer.  Voice mail not set up.  I called home number and left voice mail for return call.

## 2020-01-08 LAB — SARS CORONAVIRUS 2 (TAT 6-24 HRS): SARS Coronavirus 2: NEGATIVE

## 2020-01-09 ENCOUNTER — Encounter (HOSPITAL_COMMUNITY): Payer: Self-pay | Admitting: Internal Medicine

## 2020-01-09 ENCOUNTER — Ambulatory Visit (HOSPITAL_COMMUNITY)
Admission: RE | Admit: 2020-01-09 | Discharge: 2020-01-09 | Disposition: A | Discharge status: Home / Self Care | Payer: Medicare HMO | Attending: Internal Medicine | Admitting: Internal Medicine

## 2020-01-09 ENCOUNTER — Ambulatory Visit (HOSPITAL_COMMUNITY): Payer: Medicare HMO | Admitting: Anesthesiology

## 2020-01-09 ENCOUNTER — Encounter (HOSPITAL_COMMUNITY)
Admission: RE | Disposition: A | Discharge status: Home / Self Care | Payer: Self-pay | Source: Home / Self Care | Attending: Internal Medicine

## 2020-01-09 ENCOUNTER — Other Ambulatory Visit: Payer: Self-pay

## 2020-01-09 DIAGNOSIS — I110 Hypertensive heart disease with heart failure: Secondary | ICD-10-CM | POA: Diagnosis not present

## 2020-01-09 DIAGNOSIS — Z7951 Long term (current) use of inhaled steroids: Secondary | ICD-10-CM | POA: Diagnosis not present

## 2020-01-09 DIAGNOSIS — K2950 Unspecified chronic gastritis without bleeding: Secondary | ICD-10-CM | POA: Diagnosis not present

## 2020-01-09 DIAGNOSIS — I5032 Chronic diastolic (congestive) heart failure: Secondary | ICD-10-CM | POA: Insufficient documentation

## 2020-01-09 DIAGNOSIS — D123 Benign neoplasm of transverse colon: Secondary | ICD-10-CM | POA: Insufficient documentation

## 2020-01-09 DIAGNOSIS — K635 Polyp of colon: Secondary | ICD-10-CM | POA: Diagnosis not present

## 2020-01-09 DIAGNOSIS — D120 Benign neoplasm of cecum: Secondary | ICD-10-CM | POA: Insufficient documentation

## 2020-01-09 DIAGNOSIS — Z833 Family history of diabetes mellitus: Secondary | ICD-10-CM | POA: Diagnosis not present

## 2020-01-09 DIAGNOSIS — Q893 Situs inversus: Secondary | ICD-10-CM | POA: Diagnosis not present

## 2020-01-09 DIAGNOSIS — E1140 Type 2 diabetes mellitus with diabetic neuropathy, unspecified: Secondary | ICD-10-CM | POA: Insufficient documentation

## 2020-01-09 DIAGNOSIS — Z794 Long term (current) use of insulin: Secondary | ICD-10-CM | POA: Insufficient documentation

## 2020-01-09 DIAGNOSIS — Z79899 Other long term (current) drug therapy: Secondary | ICD-10-CM | POA: Insufficient documentation

## 2020-01-09 DIAGNOSIS — D509 Iron deficiency anemia, unspecified: Secondary | ICD-10-CM | POA: Diagnosis present

## 2020-01-09 DIAGNOSIS — Z86010 Personal history of colonic polyps: Secondary | ICD-10-CM | POA: Insufficient documentation

## 2020-01-09 DIAGNOSIS — F329 Major depressive disorder, single episode, unspecified: Secondary | ICD-10-CM | POA: Diagnosis not present

## 2020-01-09 DIAGNOSIS — Z7982 Long term (current) use of aspirin: Secondary | ICD-10-CM | POA: Diagnosis not present

## 2020-01-09 DIAGNOSIS — E785 Hyperlipidemia, unspecified: Secondary | ICD-10-CM | POA: Diagnosis not present

## 2020-01-09 DIAGNOSIS — Z8619 Personal history of other infectious and parasitic diseases: Secondary | ICD-10-CM | POA: Diagnosis not present

## 2020-01-09 DIAGNOSIS — J449 Chronic obstructive pulmonary disease, unspecified: Secondary | ICD-10-CM | POA: Diagnosis not present

## 2020-01-09 DIAGNOSIS — Z8249 Family history of ischemic heart disease and other diseases of the circulatory system: Secondary | ICD-10-CM | POA: Diagnosis not present

## 2020-01-09 DIAGNOSIS — K219 Gastro-esophageal reflux disease without esophagitis: Secondary | ICD-10-CM | POA: Diagnosis not present

## 2020-01-09 HISTORY — PX: BIOPSY: SHX5522

## 2020-01-09 HISTORY — PX: ESOPHAGOGASTRODUODENOSCOPY (EGD) WITH PROPOFOL: SHX5813

## 2020-01-09 HISTORY — PX: COLONOSCOPY WITH PROPOFOL: SHX5780

## 2020-01-09 HISTORY — PX: POLYPECTOMY: SHX5525

## 2020-01-09 LAB — GLUCOSE, CAPILLARY
Glucose-Capillary: 61 mg/dL — ABNORMAL LOW (ref 70–99)
Glucose-Capillary: 113 mg/dL — ABNORMAL HIGH (ref 70–99)

## 2020-01-09 SURGERY — COLONOSCOPY WITH PROPOFOL
Anesthesia: General

## 2020-01-09 MED ORDER — PROPOFOL 10 MG/ML IV BOLUS
INTRAVENOUS | Status: AC
  Filled 2020-01-09: qty 80

## 2020-01-09 MED ORDER — PROPOFOL 500 MG/50ML IV EMUL
INTRAVENOUS | Status: DC | PRN
Start: 2020-01-09 — End: 2020-01-09
  Administered 2020-01-09: 200 ug/kg/min via INTRAVENOUS

## 2020-01-09 MED ORDER — CHLORHEXIDINE GLUCONATE CLOTH 2 % EX PADS: 6.0000 | MEDICATED_PAD | Freq: Once | CUTANEOUS | Status: DC

## 2020-01-09 MED ORDER — LIDOCAINE HCL (CARDIAC) PF 100 MG/5ML IV SOSY
PREFILLED_SYRINGE | INTRAVENOUS | Status: DC | PRN
  Administered 2020-01-09: 50 mg via INTRAVENOUS

## 2020-01-09 MED ORDER — PROPOFOL 10 MG/ML IV BOLUS
INTRAVENOUS | Status: DC | PRN
  Administered 2020-01-09: 30 mg via INTRAVENOUS

## 2020-01-09 MED ORDER — EPHEDRINE SULFATE 50 MG/ML IJ SOLN
INTRAMUSCULAR | Status: DC | PRN
  Administered 2020-01-09 (×3): 5 mg via INTRAVENOUS
  Administered 2020-01-09: 10 mg via INTRAVENOUS

## 2020-01-09 MED ORDER — LACTATED RINGERS IV SOLN: INTRAVENOUS | Status: DC | PRN

## 2020-01-09 MED ORDER — DEXTROSE 50 % IV SOLN
1.0000 | Freq: Once | INTRAVENOUS | Status: AC
  Administered 2020-01-09: 08:00:00 50 mL via INTRAVENOUS

## 2020-01-09 MED ORDER — LACTATED RINGERS IV SOLN: Freq: Once | INTRAVENOUS | Status: AC

## 2020-01-09 NOTE — H&P (Signed)
@LOGO @   Primary Care Physician:  [name], MD Primary Gastroenterologist:  Dr. Gala Romney  Pre-Procedure History & Physical: HPI:  Brett [name] is a 69 y.o. male here for further evaluation of iron deficiency anemia via colonoscopy possible EGD.  No lower GI tract symptoms.  Distant history of tubular adenomas removed.  Distant history of H. pylori treated.  Past Medical History:  Diagnosis Date  . Chronic diastolic heart failure ([place])   . COPD (chronic obstructive pulmonary disease) ([place])   . Depression   . Dextrocardia   . Diabetes mellitus   . GERD (gastroesophageal reflux disease)   . H. pylori infection 11/09/2012   treated with pylera  . HLD (hyperlipidemia)   . HTN (hypertension)    Cholesterol  . Iron deficiency anemia, unspecified 10/18/2012  . Neuropathy   . Pneumonia   . Situs inversus totalis   . Tachycardia     Past Surgical History:  Procedure Laterality Date  . CATARACT EXTRACTION, BILATERAL  2016  . COLONOSCOPY WITH ESOPHAGOGASTRODUODENOSCOPY (EGD) N/A 11/09/2012   Dr. 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 hpylori on bx- treated with pylera. TCS- normal rectum. 1 diminutive polyp in the mid descending segment. 1-86mm polyp in the mid desending segment o/w the remainder of the colonic mucosa appeared normal. tubular adenoma on bx  . GIVENS CAPSULE STUDY N/A 10/07/2013   no source for anemia or heme positive stool noted  . NECK SURGERY     bone spurs    Prior to Admission medications   Medication Sig Start Date End Date Taking? Authorizing Provider  aspirin EC 81 MG tablet Take 81 mg by mouth daily.   Yes [provider]  Cinnamon 500 MG capsule Take 2,000 mg by mouth daily.    Yes [provider]  DULoxetine (CYMBALTA) 60 MG capsule Take 60 mg by mouth daily.   Yes [provider]  fenofibrate (TRICOR) 145 MG tablet Take 145 mg by mouth every  evening.  05/28/18  Yes [provider]  ferrous sulfate 325 (65 FE) MG tablet Take 325-650 mg by mouth See admin instructions. Take two tablets on Monday Wednesday, Friday. And 1 tablet on all other days   Yes [provider]  fexofenadine (ALLEGRA) 180 MG tablet Take 180 mg by mouth daily.   Yes [provider]  fluticasone (FLONASE) 50 MCG/ACT nasal spray Place 2 sprays into both nostrils daily. 07/12/19  Yes Barton Dubois, MD  folic acid (FOLVITE) 1 MG tablet Take 1 mg by mouth daily.   Yes [provider]  furosemide (LASIX) 40 MG tablet TAKE 1 AND 1/2 TABLET IN THE MORNING AND 1 TABLET IN THE EVENING. Patient taking differently: Take 40-60 mg by mouth See admin instructions. 60 mg in the  Morning,40 mg in the evening 12/13/19  Yes Branch, Alphonse Guild, MD  gabapentin (NEURONTIN) 600 MG tablet Take 1,200 mg by mouth 3 (three) times daily.   Yes [provider]  insulin detemir (LEVEMIR) 100 UNIT/ML injection Inject 60 Units into the skin at bedtime.    Yes [provider]  LINZESS 145 MCG CAPS capsule TAKE ONE CAPSULE BY MOUTH ONCE DAILY BEFORE BREAKFAST. Patient taking differently: Take 145 mcg by mouth daily before breakfast.  12/17/19  Yes Annitta Needs, NP  losartan (COZAAR) 100 MG tablet Take 100 mg by mouth daily.   Yes [provider]  metFORMIN (GLUCOPHAGE) 1000 MG tablet Take 1,000  mg by mouth 2 (two) times daily with a meal.   Yes [provider]  metoprolol tartrate (LOPRESSOR) 25 MG tablet Take 1 tablet (25 mg total) by mouth 2 (two) times daily. 03/28/19  Yes BranchAlphonse Guild, MD  pantoprazole (PROTONIX) 40 MG tablet Take 1 tablet (40 mg total) by mouth daily. 10/21/19  Yes Aliene Altes S, PA-C  polyethylene glycol-electrolytes (TRILYTE) 420 g solution Take 4,000 mLs by mouth as directed. 10/21/19  Yes Hamzeh Tall, Cristopher Estimable, MD  rOPINIRole (REQUIP) 2 MG tablet Take 2 mg by mouth at bedtime.    Yes [provider]   rosuvastatin (CRESTOR) 40 MG tablet Take 40 mg by mouth every evening.  03/22/18  Yes [provider]  sitaGLIPtin (JANUVIA) 50 MG tablet Take 50 mg by mouth daily.    Yes [provider]  traMADol (ULTRAM) 50 MG tablet Take 50-100 mg by mouth every 6 (six) hours as needed for moderate pain.  07/25/18  Yes [provider]  traZODone (DESYREL) 50 MG tablet Take 150 mg by mouth at bedtime.  07/03/18  Yes [provider]  albuterol (PROAIR HFA) 108 (90 Base) MCG/ACT inhaler Inhale 2 puffs into the lungs every 4 (four) hours as needed for wheezing or shortness of breath. 09/27/16   Valentina Shaggy, MD  budesonide-formoterol [facility]) 160-4.5 MCG/ACT inhaler Inhale 2 puffs into the lungs 2 (two) times daily. 05/21/19   Murlean Iba, MD    Allergies as of 10/21/2019  . (No Known Allergies)    Family History  Problem Relation Age of Onset  . Heart attack Mother   . Diabetes Sister        Fx  . Heart defect Sister        Fx  . Arthritis Sister        Fx  . Asthma Sister        Fx  . Kidney disease Sister        Fx  . Pancreatitis Sister   . Colon cancer Neg Hx     Social History   Socioeconomic History  . Marital status: Married    Spouse name: Not on file  . Number of children: 0  . Years of education: 7th grade   . Highest education level: Not on file  Occupational History  . Occupation: Biomedical scientist   Tobacco Use  . Smoking status: Never Smoker  . Smokeless tobacco: Current User    Types: Chew  Substance and Sexual Activity  . Alcohol use: No  . Drug use: No  . Sexual activity: Yes  Other Topics Concern  . Not on file  Social History Narrative  . Not on file   Social Determinants of Health   Financial Resource Strain:   . Difficulty of Paying Living Expenses:   Food Insecurity:   . Worried About Charity fundraiser in the Last Year:   . Arboriculturist in the Last Year:   Transportation Needs:   . Lexicographer (Medical):   Marland Kitchen Lack of Transportation (Non-Medical):   Physical Activity:   . Days of Exercise per Week:   . Minutes of Exercise per Session:   Stress:   . Feeling of Stress :   Social Connections:   . Frequency of Communication with Friends and Family:   . Frequency of Social Gatherings with Friends and Family:   . Attends Religious Services:   . Active Member of Clubs or Organizations:   .  Attends Archivist Meetings:   Marland Kitchen Marital Status:   Intimate Partner Violence:   . Fear of Current or Ex-Partner:   . Emotionally Abused:   Marland Kitchen Physically Abused:   . Sexually Abused:     Review of Systems: See HPI, otherwise negative ROS  Physical Exam: BP 139/67   Pulse (!) 58   Temp 97.8 F (36.6 C) (Oral)   Resp (!) 21   Ht 5\' 7"  (1.702 m)   Wt 95.3 kg   SpO2 98%   BMI 32.89 kg/m  General:   Alert,  Well-developed, well-nourished, pleasant and cooperative in NAD Neck:  Supple; no masses or thyromegaly. No significant cervical adenopathy. Lungs:  Clear throughout to auscultation.   No wheezes, crackles, or rhonchi. No acute distress. Heart:  Regular rate and rhythm; no murmurs, clicks, rubs,  or gallops. Abdomen: Non-distended, normal bowel sounds.  Soft and nontender without appreciable mass or hepatosplenomegaly.  Pulses:  Normal pulses noted. Extremities:  Without clubbing or edema.  Impression/Plan: 69 year old gentleman iron deficiency anemia.  History of colonic adenoma.  History of GERD well-controlled.  Here for diagnostic colonoscopy for iron deficiency anemia.  Possible EGD to follow.  We need to assess for eradication of H. Pylori.  The risks, benefits, limitations, imponderables and alternatives regarding both EGD and colonoscopy have been reviewed with the patient. Questions have been answered. All parties agreeable.      Notice: This dictation was prepared with Dragon dictation along with smaller phrase technology. Any transcriptional  errors that result from this process are unintentional and may not be corrected upon review.

## 2020-01-09 NOTE — Op Note (Signed)
[facility] Patient Name: Brett [name] Procedure Date: 01/09/2020 8:44 AM MRN: [id number] Date of Birth: [birth year] Attending MD: Norvel Richards , MD CSN: [id number] Age: 69 Admit Type: Outpatient Procedure:                Upper GI endoscopy Indications:              Iron deficiency anemia Providers:                Norvel Richards, MD, Janeece Riggers, RN, Nelma Rothman, Technician Referring MD:              Medicines:                Propofol per Anesthesia Complications:            No immediate complications. Estimated Blood Loss:     Estimated blood loss was minimal. Procedure:                Pre-Anesthesia Assessment:                           - Prior to the procedure, a History and Physical                            was performed, and patient medications and                            allergies were reviewed. The patient's tolerance of                            previous anesthesia was also reviewed. The risks                            and benefits of the procedure and the sedation                            options and risks were discussed with the patient.                            All questions were answered, and informed consent                            was obtained. Prior Anticoagulants: The patient has                            taken no previous anticoagulant or antiplatelet                            agents. ASA Grade Assessment: III - A patient with                            severe systemic disease. After reviewing the risks  and benefits, the patient was deemed in                            satisfactory condition to undergo the procedure.                           After obtaining informed consent, the endoscope was                            passed under direct vision. Throughout the                            procedure, the patient's blood pressure, pulse, and                            oxygen  saturations were monitored continuously. The                            [id number]) scope was introduced through the                            mouth, and advanced to the second part of duodenum.                            The upper GI endoscopy was accomplished without                            difficulty. The patient tolerated the procedure                            well. Scope In: 8:50:54 AM Scope Out: 8:55:36 AM Total Procedure Duration: 0 hours 4 minutes 42 seconds  Findings:      The examined esophagus was normal.      The entire examined stomach was normal. This was biopsied with a cold       forceps for histology. Estimated blood loss was minimal.      The duodenal bulb and second portion of the duodenum were normal.      Unusual "lay" consistent with history of situs inversus. Impression:               - Normal esophagus.                           - Normal stomach. Biopsied -to prove eradication of                            H. pylori                           - Normal duodenal bulb and second portion of the                            duodenum. Situs inversus. Moderate Sedation:      Moderate (conscious) sedation was personally administered by an       anesthesia professional. The following parameters  were monitored: oxygen       saturation, heart rate, blood pressure, respiratory rate, EKG, adequacy       of pulmonary ventilation, and response to care. Recommendation:           - Patient has a contact number available for                            emergencies. The signs and symptoms of potential                            delayed complications were discussed with the                            patient. Return to normal activities tomorrow.                            Written discharge instructions were provided to the                            patient.                           - Resume previous diet.                           - Continue present medications.                            - Return to my office (date not yet determined).                            See colonoscopy report. Procedure Code(s):        --- Professional ---                           [phone], Esophagogastroduodenoscopy, flexible,                            transoral; with biopsy, single or multiple Diagnosis Code(s):        --- Professional ---                           D50.9, Iron deficiency anemia, unspecified CPT copyright 2019 American Medical Association. All rights reserved. The codes documented in this report are preliminary and upon coder review may  be revised to meet current compliance requirements. Cristopher Estimable. Shafter Jupin, MD Norvel Richards, MD 01/09/2020 9:00:30 AM This report has been signed electronically. Number of Addenda: 0

## 2020-01-09 NOTE — Discharge Instructions (Signed)
°Colonoscopy °Discharge Instructions ° °Read the instructions outlined below and refer to this sheet in the next few weeks. These discharge instructions provide you with general information on caring for yourself after you leave the hospital. Your doctor may also give you specific instructions. While your treatment has been planned according to the most current medical practices available, unavoidable complications occasionally occur. If you have any problems or questions after discharge, call Dr. Rourk at [phone]. °ACTIVITY °· You may resume your regular activity, but move at a slower pace for the next 24 hours.  °· Take frequent rest periods for the next 24 hours.  °· Walking will help get rid of the air and reduce the bloated feeling in your belly (abdomen).  °· No driving for 24 hours (because of the medicine (anesthesia) used during the test).   °· Do not sign any important legal documents or operate any machinery for 24 hours (because of the anesthesia used during the test).  °NUTRITION °· Drink plenty of fluids.  °· You may resume your normal diet as instructed by your doctor.  °· Begin with a light meal and progress to your normal diet. Heavy or fried foods are harder to digest and may make you feel sick to your stomach (nauseated).  °· Avoid alcoholic beverages for 24 hours or as instructed.  °MEDICATIONS °· You may resume your normal medications unless your doctor tells you otherwise.  °WHAT YOU CAN EXPECT TODAY °· Some feelings of bloating in the abdomen.  °· Passage of more gas than usual.  °· Spotting of blood in your stool or on the toilet paper.  °IF YOU HAD POLYPS REMOVED DURING THE COLONOSCOPY: °· No aspirin products for 7 days or as instructed.  °· No alcohol for 7 days or as instructed.  °· Eat a soft diet for the next 24 hours.  °FINDING OUT THE RESULTS OF YOUR TEST °Not all test results are available during your visit. If your test results are not back during the visit, make an appointment  with your caregiver to find out the results. Do not assume everything is normal if you have not heard from your caregiver or the medical facility. It is important for you to follow up on all of your test results.  °SEEK IMMEDIATE MEDICAL ATTENTION IF: °· You have more than a spotting of blood in your stool.  °· Your belly is swollen (abdominal distention).  °· You are nauseated or vomiting.  °· You have a temperature over 101.  °· You have abdominal pain or discomfort that is severe or gets worse throughout the day.  °EGD °Discharge instructions °Please read the instructions outlined below and refer to this sheet in the next few weeks. These discharge instructions provide you with general information on caring for yourself after you leave the hospital. Your doctor may also give you specific instructions. While your treatment has been planned according to the most current medical practices available, unavoidable complications occasionally occur. If you have any problems or questions after discharge, please call your doctor. °ACTIVITY °· You may resume your regular activity but move at a slower pace for the next 24 hours.  °· Take frequent rest periods for the next 24 hours.  °· Walking will help expel (get rid of) the air and reduce the bloated feeling in your abdomen.  °· No driving for 24 hours (because of the anesthesia (medicine) used during the test).  °· You may shower.  °· Do not sign any important   legal documents or operate any machinery for 24 hours (because of the anesthesia used during the test).  NUTRITION  Drink plenty of fluids.   You may resume your normal diet.   Begin with a light meal and progress to your normal diet.   Avoid alcoholic beverages for 24 hours or as instructed by your caregiver.  MEDICATIONS  You may resume your normal medications unless your caregiver tells you otherwise.  WHAT YOU CAN EXPECT TODAY  You may experience abdominal discomfort such as a feeling of fullness  or gas pains.  FOLLOW-UP  Your doctor will discuss the results of your test with you.  SEEK IMMEDIATE MEDICAL ATTENTION IF ANY OF THE FOLLOWING OCCUR:  Excessive nausea (feeling sick to your stomach) and/or vomiting.   Severe abdominal pain and distention (swelling).   Trouble swallowing.   Temperature over 101 F (37.8 C).   Rectal bleeding or vomiting of blood.    Colon polyp information provided  Further recommendations to follow pending review of pathology report  Office visit with [place] in 6 weeks  At patient request, I called [place] at [phone] -got voicemail-left message     Colon Polyps  Polyps are tissue growths inside the body. Polyps can grow in many places, including the large intestine (colon). A polyp may be a round bump or a mushroom-shaped growth. You could have one polyp or several. Most colon polyps are noncancerous (benign). However, some colon polyps can become cancerous over time. Finding and removing the polyps early can help prevent this. What are the causes? The exact cause of colon polyps is not known. What increases the risk? You are more likely to develop this condition if you:  Have a family history of colon cancer or colon polyps.  Are older than 81 or older than 45 if you are African American.  Have inflammatory bowel disease, such as ulcerative colitis or Crohn's disease.  Have certain hereditary conditions, such as: ? Familial adenomatous polyposis. ? Lynch syndrome. ? Turcot syndrome. ? Peutz-Jeghers syndrome.  Are overweight.  Smoke cigarettes.  Do not get enough exercise.  Drink too much alcohol.  Eat a diet that is high in fat and red meat and low in fiber.  Had childhood cancer that was treated with abdominal radiation. What are the signs or symptoms? Most polyps do not cause symptoms. If you have symptoms, they may include:  Blood coming from your rectum when having a bowel movement.  Blood in your stool. The  stool may look dark red or black.  Abdominal pain.  A change in bowel habits, such as constipation or diarrhea. How is this diagnosed? This condition is diagnosed with a colonoscopy. This is a procedure in which a lighted, flexible scope is inserted into the anus and then passed into the colon to examine the area. Polyps are sometimes found when a colonoscopy is done as part of routine cancer screening tests. How is this treated? Treatment for this condition involves removing any polyps that are found. Most polyps can be removed during a colonoscopy. Those polyps will then be tested for cancer. Additional treatment may be needed depending on the results of testing. Follow these instructions at home: Lifestyle  Maintain a healthy weight, or lose weight if recommended by your health care provider.  Exercise every day or as told by your health care provider.  Do not use any products that contain nicotine or tobacco, such as cigarettes and e-cigarettes. If you need help quitting, ask your health  care provider.  If you drink alcohol, limit how much you have: ? 0-1 drink a day for women. ? 0-2 drinks a day for men.  Be aware of how much alcohol is in your drink. In the [place] one drink equals one 12 oz bottle of beer (355 mL), one 5 oz glass of wine (148 mL), or one 1 oz shot of hard liquor (44 mL). Eating and drinking   Eat foods that are high in fiber, such as fruits, vegetables, and whole grains.  Eat foods that are high in calcium and vitamin D, such as milk, cheese, yogurt, eggs, liver, fish, and broccoli.  Limit foods that are high in fat, such as fried foods and desserts.  Limit the amount of red meat and processed meat you eat, such as hot dogs, sausage, bacon, and lunch meats. General instructions  Keep all follow-up visits as told by your health care provider. This is important. ? This includes having regularly scheduled colonoscopies. ? Talk to your health care provider  about when you need a colonoscopy. Contact a health care provider if:  You have new or worsening bleeding during a bowel movement.  You have new or increased blood in your stool.  You have a change in bowel habits.  You lose weight for no known reason. Summary  Polyps are tissue growths inside the body. Polyps can grow in many places, including the colon.  Most colon polyps are noncancerous (benign), but some can become cancerous over time.  This condition is diagnosed with a colonoscopy.  Treatment for this condition involves removing any polyps that are found. Most polyps can be removed during a colonoscopy. This information is not intended to replace advice given to you by your health care provider. Make sure you discuss any questions you have with your health care provider. Document Revised: 12/14/2017 Document Reviewed: 12/14/2017 Elsevier Patient Education  [place] These instructions provide you with information about caring for yourself after your procedure. Your health care provider may also give you more specific instructions. Your treatment has been planned according to current medical practices, but problems sometimes occur. Call your health care provider if you have any problems or questions after your procedure. What can I expect after the procedure? After your procedure, you may:  Feel sleepy for several hours.  Feel clumsy and have poor balance for several hours.  Feel forgetful about what happened after the procedure.  Have poor judgment for several hours.  Feel nauseous or vomit.  Have a sore throat if you had a breathing tube during the procedure. Follow these instructions at home: For at least 24 hours after the procedure:      Have a responsible adult stay with you. It is important to have someone help care for you until you are awake and alert.  Rest as needed.  Do not: ? Participate in  activities in which you could fall or become injured. ? Drive. ? Use heavy machinery. ? Drink alcohol. ? Take sleeping pills or medicines that cause drowsiness. ? Make important decisions or sign legal documents. ? Take care of children on your own. Eating and drinking  Follow the diet that is recommended by your health care provider.  If you vomit, drink water, juice, or soup when you can drink without vomiting.  Make sure you have little or no nausea before eating solid foods. General instructions  Take over-the-counter and prescription medicines only as told  by your health care provider.  If you have sleep apnea, surgery and certain medicines can increase your risk for breathing problems. Follow instructions from your health care provider about wearing your sleep device: ? Anytime you are sleeping, including during daytime naps. ? While taking prescription pain medicines, sleeping medicines, or medicines that make you drowsy.  If you smoke, do not smoke without supervision.  Keep all follow-up visits as told by your health care provider. This is important. Contact a health care provider if:  You keep feeling nauseous or you keep vomiting.  You feel light-headed.  You develop a rash.  You have a fever. Get help right away if:  You have trouble breathing. Summary  For several hours after your procedure, you may feel sleepy and have poor judgment.  Have a responsible adult stay with you for at least 24 hours or until you are awake and alert. This information is not intended to replace advice given to you by your health care provider. Make sure you discuss any questions you have with your health care provider. Document Revised: 11/27/2017 Document Reviewed: 12/20/2015 Elsevier Patient Education  [place].

## 2020-01-09 NOTE — Op Note (Signed)
[facility] Patient Name: Brett [name] Procedure Date: 01/09/2020 8:05 AM MRN: [id number] Date of Birth: [birth year] Attending MD: Norvel Richards , MD CSN: [id number] Age: 69 Admit Type: Outpatient Procedure:                Colonoscopy Indications:              Iron deficiency anemia Providers:                Norvel Richards, MD, Janeece Riggers, RN, Aram Candela Referring MD:              Medicines:                Propofol per Anesthesia Complications:            No immediate complications. Estimated Blood Loss:     Estimated blood loss was minimal. Estimated blood                            loss was minimal. Procedure:                Pre-Anesthesia Assessment:                           - Prior to the procedure, a History and Physical                            was performed, and patient medications and                            allergies were reviewed. The patient's tolerance of                            previous anesthesia was also reviewed. The risks                            and benefits of the procedure and the sedation                            options and risks were discussed with the patient.                            All questions were answered, and informed consent                            was obtained. Prior Anticoagulants: The patient has                            taken no previous anticoagulant or antiplatelet                            agents. ASA Grade Assessment: III - A patient with                            severe  systemic disease. After reviewing the risks                            and benefits, the patient was deemed in                            satisfactory condition to undergo the procedure.                           After obtaining informed consent, the colonoscope                            was passed under direct vision. Throughout the                            procedure, the patient's blood pressure, pulse,  and                            oxygen saturations were monitored continuously. The                            [id number]) scope was introduced through                            the anus and advanced to the the cecum, identified                            by appendiceal orifice and ileocecal valve. The                            colonoscopy was performed without difficulty. The                            patient tolerated the procedure well. The quality                            of the bowel preparation was adequate. Scope In: 8:24:26 AM Scope Out: 8:44:31 AM Scope Withdrawal Time: 0 hours 9 minutes 32 seconds  Total Procedure Duration: 0 hours 20 minutes 5 seconds  Findings:      The perianal and digital rectal examinations were normal.      Four sessile polyps were found in the hepatic flexure and cecum. The       polyps were 3 to 5 mm in size. These polyps were removed with a cold       snare. Resection and retrieval were complete. Estimated blood loss was       minimal.      The exam was otherwise without abnormality on direct and retroflexion       views. 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 Impression:               - Four 3 to 5 mm polyps at the hepatic flexure and  in the cecum, removed with a cold snare. Resected                            and retrieved.                           - The examination was otherwise normal on direct                            and retroflexion views. Moderate Sedation:      Moderate (conscious) sedation was personally administered by an       anesthesia professional. The following parameters were monitored: oxygen       saturation, heart rate, blood pressure, respiratory rate, EKG, adequacy       of pulmonary ventilation, and response to care. Recommendation:           - Patient has a contact number available for                            emergencies. The  signs and symptoms of potential                            delayed complications were discussed with the                            patient. Return to normal activities tomorrow.                            Written discharge instructions were provided to the                            patient.                           - Resume previous diet.                           - Continue present medications.                           - Repeat colonoscopy date to be determined after                            pending pathology results are reviewed for                            surveillance based on pathology results.                           - Return to GI office (date not yet determined).                            See EGD report. Procedure Code(s):        --- Professional ---  45385, Colonoscopy, flexible; with removal of                            tumor(s), polyp(s), or other lesion(s) by snare                            technique Diagnosis Code(s):        --- Professional ---                           K63.5, Polyp of colon                           D50.9, Iron deficiency anemia, unspecified CPT copyright 2019 American Medical Association. All rights reserved. The codes documented in this report are preliminary and upon coder review may  be revised to meet current compliance requirements. Cristopher Estimable. Louan Base, MD Norvel Richards, MD 01/09/2020 8:50:04 AM This report has been signed electronically. Number of Addenda: 0

## 2020-01-09 NOTE — Anesthesia Preprocedure Evaluation (Signed)
Anesthesia Evaluation  Patient identified by MRN, date of birth, ID band Patient awake    Reviewed: Allergy & Precautions, NPO status , Patient's Chart, lab work & pertinent test results  History of Anesthesia Complications (+) history of anesthetic complications  Airway Mallampati: III  TM Distance: >3 FB Neck ROM: Full    Dental  (+) Upper Dentures, Edentulous Lower   Pulmonary asthma , pneumonia, resolved, COPD,    Pulmonary exam normal breath sounds clear to auscultation       Cardiovascular Exercise Tolerance: Poor hypertension, +CHF  Normal cardiovascular exam+ dysrhythmias  Rhythm:Regular Rate:Normal  08-Jul-2019 14:36:31 [facility] ROUTINE RECORD Sinus or ectopic atrial rhythm Right axis deviation Low voltage, precordial leads Abnormal lateral Q waves Probable anteroseptal infarct, old   Neuro/Psych PSYCHIATRIC DISORDERS Depression  Neuromuscular disease    GI/Hepatic GERD  Medicated and Controlled,  Endo/Other  diabetes, Well Controlled, Type 2  Renal/GU      Musculoskeletal   Abdominal   Peds  Hematology  (+) anemia ,   Anesthesia Other Findings   Reproductive/Obstetrics                            Anesthesia Physical Anesthesia Plan  ASA: III  Anesthesia Plan: General   Post-op Pain Management:    Induction: Intravenous  PONV Risk Score and Plan: 1 and Treatment may vary due to age or medical condition and TIVA  Airway Management Planned: Nasal Cannula, Natural Airway and Simple Face Mask  Additional Equipment:   Intra-op Plan:   Post-operative Plan:   Informed Consent: I have reviewed the patients History and Physical, chart, labs and discussed the procedure including the risks, benefits and alternatives for the proposed anesthesia with the patient or authorized representative who has indicated his/her understanding and acceptance.      Dental advisory given  Plan Discussed with: CRNA  Anesthesia Plan Comments:        Anesthesia Quick Evaluation

## 2020-01-09 NOTE — Progress Notes (Signed)
As patient getting dressed noted right 5th metatarsal erythematous "I wanted ya'll to look at it".  Recommended patient follow up with his primary care doctor due to redness and history of diabetes. Advised Virgie Dad to tell patient's wife for patient to see primary doctor regarding toe.  Patient and nephew verbalized understanding.

## 2020-01-09 NOTE — Transfer of Care (Signed)
Immediate Anesthesia Transfer of Care Note  Patient: Brett [name]  Procedure(s) Performed: COLONOSCOPY WITH PROPOFOL (N/A ) ESOPHAGOGASTRODUODENOSCOPY (EGD) WITH PROPOFOL (N/A ) POLYPECTOMY  Patient Location: PACU  Anesthesia Type:General  Level of Consciousness: drowsy and patient cooperative  Airway & Oxygen Therapy: Patient Spontanous Breathing and Patient connected to nasal cannula oxygen  Post-op Assessment: Report given to RN and Post -op Vital signs reviewed and stable  Post vital signs: Reviewed and stable  Last Vitals:  Vitals Value Taken Time  BP 101/53 01/09/20 0901  Temp    Pulse 55 01/09/20 0903  Resp 14 01/09/20 0903  SpO2 94 % 01/09/20 0903  Vitals shown include unvalidated device data.  Last Pain:  Vitals:   01/09/20 0741  TempSrc: Oral  PainSc: 0-No pain         Complications: No apparent anesthesia complications

## 2020-01-09 NOTE — Anesthesia Postprocedure Evaluation (Signed)
Anesthesia Post Note  Patient: Brett [name]  Procedure(s) Performed: COLONOSCOPY WITH PROPOFOL (N/A ) ESOPHAGOGASTRODUODENOSCOPY (EGD) WITH PROPOFOL (N/A ) POLYPECTOMY  Patient location during evaluation: PACU Anesthesia Type: General Level of consciousness: awake and alert and patient cooperative Pain management: pain level controlled Vital Signs Assessment: post-procedure vital signs reviewed and stable Respiratory status: spontaneous breathing Cardiovascular status: blood pressure returned to baseline Anesthetic complications: no     Last Vitals:  Vitals:   01/09/20 0741 01/09/20 0900  BP: 139/67   Pulse: (!) 58 (!) 55  Resp: (!) 21 14  Temp: 36.6 C 36.8 C  SpO2: 98% 95%    Last Pain:  Vitals:   01/09/20 0900  TempSrc:   PainSc: 0-No pain                 Gayland Curry

## 2020-01-10 ENCOUNTER — Other Ambulatory Visit (HOSPITAL_BASED_OUTPATIENT_CLINIC_OR_DEPARTMENT_OTHER): Payer: Self-pay

## 2020-01-10 ENCOUNTER — Other Ambulatory Visit: Payer: Self-pay

## 2020-01-10 ENCOUNTER — Encounter: Payer: Self-pay | Admitting: Internal Medicine

## 2020-01-10 LAB — SURGICAL PATHOLOGY

## 2020-01-15 NOTE — Telephone Encounter (Signed)
Spoke with wife and scheduled

## 2020-01-16 ENCOUNTER — Telehealth: Payer: Self-pay

## 2020-01-16 NOTE — Telephone Encounter (Signed)
Pt's spouse called back. Pt's spouse was notified of pts lab results. Pt will f/u after his procedure as directed. Our office tried to call pt several times and pts spouse called back.

## 2020-01-20 ENCOUNTER — Other Ambulatory Visit: Payer: Self-pay

## 2020-01-20 ENCOUNTER — Other Ambulatory Visit (HOSPITAL_COMMUNITY)
Admission: RE | Admit: 2020-01-20 | Discharge: 2020-01-20 | Disposition: A | Discharge status: Home / Self Care | Payer: Medicare HMO | Source: Ambulatory Visit | Attending: Neurology | Admitting: Neurology

## 2020-01-20 DIAGNOSIS — Z01812 Encounter for preprocedural laboratory examination: Secondary | ICD-10-CM | POA: Diagnosis present

## 2020-01-20 DIAGNOSIS — Z20822 Contact with and (suspected) exposure to covid-19: Secondary | ICD-10-CM | POA: Insufficient documentation

## 2020-01-21 LAB — SARS CORONAVIRUS 2 (TAT 6-24 HRS): SARS Coronavirus 2: NEGATIVE

## 2020-01-22 ENCOUNTER — Ambulatory Visit: Payer: Medicare HMO | Attending: Internal Medicine | Admitting: Neurology

## 2020-01-22 ENCOUNTER — Other Ambulatory Visit: Payer: Self-pay

## 2020-01-22 DIAGNOSIS — G4733 Obstructive sleep apnea (adult) (pediatric): Secondary | ICD-10-CM

## 2020-01-23 ENCOUNTER — Other Ambulatory Visit (HOSPITAL_BASED_OUTPATIENT_CLINIC_OR_DEPARTMENT_OTHER): Payer: Self-pay

## 2020-01-23 DIAGNOSIS — G4733 Obstructive sleep apnea (adult) (pediatric): Secondary | ICD-10-CM

## 2020-01-26 NOTE — Procedures (Signed)
Yonah A. Merlene Laughter, MD     [email or web address]             NOCTURNAL POLYSOMNOGRAPHY   LOCATION: [facility]  Patient Name: Brett [name], Brett [name] Date: 01/22/2020 Gender: Male D.O.B: 09/06/1951 Age (years): 52 Referring Provider: Barton Fanny NP Height (inches): 67 Interpreting Physician: [name] Odor MD, ABSM Weight (lbs): 210 RPSGT: Peak, Robert BMI: 33 MRN: [id number] Neck Size: 18.00 CLINICAL INFORMATION Sleep Study Type: NPSG     Indication for sleep study: N/A     Epworth Sleepiness Score: 4     SLEEP STUDY TECHNIQUE As per the AASM Manual for the Scoring of Sleep and Associated Events v2.3 (April 2016) with a hypopnea requiring 4% desaturations.  The channels recorded and monitored were frontal, central and occipital EEG, electrooculogram (EOG), submentalis EMG (chin), nasal and oral airflow, thoracic and abdominal wall motion, anterior tibialis EMG, snore microphone, electrocardiogram, and pulse oximetry.  MEDICATIONS Medications self-administered by patient taken the night of the study : N/A  Current Outpatient Medications:  .  albuterol (PROAIR HFA) 108 (90 Base) MCG/ACT inhaler, Inhale 2 puffs into the lungs every 4 (four) hours as needed for wheezing or shortness of breath., Disp: 1 Inhaler, Rfl: 2 .  aspirin EC 81 MG tablet, Take 81 mg by mouth daily., Disp: , Rfl:  .  budesonide-formoterol (SYMBICORT) 160-4.5 MCG/ACT inhaler, Inhale 2 puffs into the lungs 2 (two) times daily., Disp: 1 Inhaler, Rfl: 5 .  Cinnamon 500 MG capsule, Take 2,000 mg by mouth daily. , Disp: , Rfl:  .  DULoxetine (CYMBALTA) 60 MG capsule, Take 60 mg by mouth daily., Disp: , Rfl:  .  fenofibrate (TRICOR) 145 MG tablet, Take 145 mg by mouth every evening. , Disp: , Rfl:  .  ferrous sulfate 325 (65 FE) MG tablet, Take 325-650 mg by mouth See admin instructions. Take two tablets on Monday Wednesday, Friday. And 1 tablet on all other days, Disp: , Rfl:  .   fexofenadine (ALLEGRA) 180 MG tablet, Take 180 mg by mouth daily., Disp: , Rfl:  .  fluticasone (FLONASE) 50 MCG/ACT nasal spray, Place 2 sprays into both nostrils daily., Disp: 16 g, Rfl: 2 .  folic acid (FOLVITE) 1 MG tablet, Take 1 mg by mouth daily., Disp: , Rfl:  .  furosemide (LASIX) 40 MG tablet, TAKE 1 AND 1/2 TABLET IN THE MORNING AND 1 TABLET IN THE EVENING. (Patient taking differently: Take 40-60 mg by mouth See admin instructions. 60 mg in the  Morning,40 mg in the evening), Disp: 225 tablet, Rfl: 1 .  gabapentin (NEURONTIN) 600 MG tablet, Take 1,200 mg by mouth 3 (three) times daily., Disp: , Rfl:  .  insulin detemir (LEVEMIR) 100 UNIT/ML injection, Inject 60 Units into the skin at bedtime. , Disp: , Rfl:  .  LINZESS 145 MCG CAPS capsule, TAKE ONE CAPSULE BY MOUTH ONCE DAILY BEFORE BREAKFAST. (Patient taking differently: Take 145 mcg by mouth daily before breakfast. ), Disp: 90 capsule, Rfl: 3 .  losartan (COZAAR) 100 MG tablet, Take 100 mg by mouth daily., Disp: , Rfl:  .  metFORMIN (GLUCOPHAGE) 1000 MG tablet, Take 1,000 mg by mouth 2 (two) times daily with a meal., Disp: , Rfl:  .  metoprolol tartrate (LOPRESSOR) 25 MG tablet, Take 1 tablet (25 mg total) by mouth 2 (two) times daily., Disp: 180 tablet, Rfl: 3 .  pantoprazole (PROTONIX) 40 MG tablet, Take 1 tablet (40 mg total) by mouth daily., Disp: 30 tablet, Rfl:  11 .  polyethylene glycol-electrolytes (TRILYTE) 420 g solution, Take 4,000 mLs by mouth as directed., Disp: 4000 mL, Rfl: 0 .  rOPINIRole (REQUIP) 2 MG tablet, Take 2 mg by mouth at bedtime. , Disp: , Rfl:  .  rosuvastatin (CRESTOR) 40 MG tablet, Take 40 mg by mouth every evening. , Disp: , Rfl:  .  sitaGLIPtin (JANUVIA) 50 MG tablet, Take 50 mg by mouth daily. , Disp: , Rfl:  .  traMADol (ULTRAM) 50 MG tablet, Take 50-100 mg by mouth every 6 (six) hours as needed for moderate pain. , Disp: , Rfl:  .  traZODone (DESYREL) 50 MG tablet, Take 150 mg by mouth at bedtime. ,  Disp: , Rfl:      SLEEP ARCHITECTURE The study was initiated at 9:42:40 PM and ended at 4:26:29 AM.  Sleep onset time was 3.3 minutes and the sleep efficiency was 67.6%%. The total sleep time was 273 minutes.  Stage REM latency was N/A minutes.  The patient spent 21.2%% of the night in stage N1 sleep, 78.8%% in stage N2 sleep, 0.0%% in stage N3 and 0% in REM.  Alpha intrusion was absent.  Supine sleep was 81.68%.  RESPIRATORY PARAMETERS The overall apnea/hypopnea index (AHI) was 60.4 per hour. There were 38 total apneas, including 33 obstructive, 5 central and 0 mixed apneas. There were 237 hypopneas and 11 RERAs.  The AHI during Stage REM sleep was N/A per hour.  AHI while supine was 68.1 per hour.  The mean oxygen saturation was 86.6%. The minimum SpO2 during sleep was 75.0%.  loud snoring was noted during this study.  CARDIAC DATA The 2 lead EKG demonstrated sinus rhythm. The mean heart rate was 27.4 beats per minute. Other EKG findings include: None.   LEG MOVEMENT DATA The total PLMS were 0 with a resulting PLMS index of 0.0. Associated arousal with leg movement index was 0.0.  IMPRESSIONS 1. Severe obstructive sleep apnea syndrome is documented with his recording. AutoPAP 8-15 is recommended. 2. Abnormal sleep architecture is noted with absent REM sleep, absent slow wave sleep and fragmentation of sleep.   Delano Metz, MD Diplomate, American Board of Sleep Medicine. ELECTRONICALLY SIGNED ON:  01/26/2020, 3:42 PM [place] PH: [phone]   FX: [phone] [place]

## 2020-01-29 ENCOUNTER — Other Ambulatory Visit: Payer: Self-pay

## 2020-01-31 ENCOUNTER — Inpatient Hospital Stay: Payer: Medicare HMO | Admitting: Orthopaedic Surgery

## 2020-02-04 NOTE — Progress Notes (Signed)
Reviewed chart with Dr. Kalman Shan. OK to proceed with surgery without further testing/clearance.

## 2020-02-05 ENCOUNTER — Other Ambulatory Visit: Payer: Self-pay | Admitting: Family

## 2020-02-06 ENCOUNTER — Encounter (HOSPITAL_BASED_OUTPATIENT_CLINIC_OR_DEPARTMENT_OTHER): Payer: Self-pay | Admitting: Orthopaedic Surgery

## 2020-02-06 ENCOUNTER — Other Ambulatory Visit: Payer: Self-pay

## 2020-02-07 ENCOUNTER — Encounter (HOSPITAL_BASED_OUTPATIENT_CLINIC_OR_DEPARTMENT_OTHER)
Admission: RE | Admit: 2020-02-07 | Discharge: 2020-02-07 | Disposition: A | Discharge status: Home / Self Care | Payer: Medicare HMO | Source: Ambulatory Visit | Attending: Orthopaedic Surgery | Admitting: Orthopaedic Surgery

## 2020-02-07 DIAGNOSIS — Z01818 Encounter for other preprocedural examination: Secondary | ICD-10-CM | POA: Diagnosis not present

## 2020-02-07 LAB — BASIC METABOLIC PANEL
Anion gap: 11 (ref 5–15)
BUN: 22 mg/dL (ref 8–23)
CO2: 25 mmol/L (ref 22–32)
Calcium: 9.7 mg/dL (ref 8.9–10.3)
Chloride: 98 mmol/L (ref 98–111)
Creatinine, Ser: 1.1 mg/dL (ref 0.61–1.24)
GFR calc Af Amer: >60 mL/min (ref >60)
GFR calc non Af Amer: >60 mL/min (ref >60)
Glucose, Bld: 96 mg/dL (ref 70–99)
Potassium: 4.7 mmol/L (ref 3.5–5.1)
Sodium: 134 mmol/L — ABNORMAL LOW (ref 135–145)

## 2020-02-07 NOTE — Progress Notes (Signed)

## 2020-02-08 ENCOUNTER — Other Ambulatory Visit (HOSPITAL_COMMUNITY)
Admission: RE | Admit: 2020-02-08 | Discharge: 2020-02-08 | Disposition: A | Discharge status: Home / Self Care | Payer: Medicare HMO | Source: Ambulatory Visit | Attending: Orthopaedic Surgery | Admitting: Orthopaedic Surgery

## 2020-02-08 DIAGNOSIS — Z01812 Encounter for preprocedural laboratory examination: Secondary | ICD-10-CM | POA: Insufficient documentation

## 2020-02-08 DIAGNOSIS — Z20822 Contact with and (suspected) exposure to covid-19: Secondary | ICD-10-CM | POA: Insufficient documentation

## 2020-02-09 LAB — SARS CORONAVIRUS 2 (TAT 6-24 HRS): SARS Coronavirus 2: NEGATIVE

## 2020-02-11 NOTE — Anesthesia Preprocedure Evaluation (Addendum)
Anesthesia Evaluation  Patient identified by MRN, date of birth, ID band Patient awake    Reviewed: Allergy & Precautions, NPO status , Patient's Chart, lab work & pertinent test results  Airway Mallampati: II  TM Distance: >3 FB Neck ROM: Full    Dental no notable dental hx. (+) Edentulous Lower, Upper Dentures   Pulmonary asthma , COPD,  COPD inhaler,    Pulmonary exam normal breath sounds clear to auscultation       Cardiovascular Exercise Tolerance: Good hypertension, Pt. on medications and Pt. on home beta blockers +CHF  Normal cardiovascular exam Rhythm:Regular Rate:Normal     Neuro/Psych PSYCHIATRIC DISORDERS Depression  Neuromuscular disease negative neurological ROS     GI/Hepatic GERD  Medicated,  Endo/Other  diabetes, Type 2, Insulin Dependent  Renal/GU      Musculoskeletal negative musculoskeletal ROS (+)   Abdominal (+) + obese,   Peds  Hematology  (+) Blood dyscrasia, anemia ,   Anesthesia Other Findings   Reproductive/Obstetrics negative OB ROS                            Anesthesia Physical Anesthesia Plan  ASA: III  Anesthesia Plan: Bier Block and Bier Block-LIDOCAINE ONLY   Post-op Pain Management:    Induction:   PONV Risk Score and Plan: 1 and Treatment may vary due to age or medical condition  Airway Management Planned: Nasal Cannula  Additional Equipment: None  Intra-op Plan:   Post-operative Plan:   Informed Consent: I have reviewed the patients History and Physical, chart, labs and discussed the procedure including the risks, benefits and alternatives for the proposed anesthesia with the patient or authorized representative who has indicated his/her understanding and acceptance.     Dental advisory given  Plan Discussed with:   Anesthesia Plan Comments: (Bier block w mac)       Anesthesia Quick Evaluation

## 2020-02-12 ENCOUNTER — Encounter (HOSPITAL_BASED_OUTPATIENT_CLINIC_OR_DEPARTMENT_OTHER)
Admission: RE | Disposition: A | Discharge status: Home / Self Care | Payer: Self-pay | Source: Home / Self Care | Attending: Orthopaedic Surgery

## 2020-02-12 ENCOUNTER — Ambulatory Visit (HOSPITAL_BASED_OUTPATIENT_CLINIC_OR_DEPARTMENT_OTHER)
Admission: RE | Admit: 2020-02-12 | Discharge: 2020-02-12 | Disposition: A | Discharge status: Home / Self Care | Payer: Medicare HMO | Attending: Orthopaedic Surgery | Admitting: Orthopaedic Surgery

## 2020-02-12 ENCOUNTER — Ambulatory Visit (HOSPITAL_BASED_OUTPATIENT_CLINIC_OR_DEPARTMENT_OTHER): Payer: Medicare HMO | Admitting: Anesthesiology

## 2020-02-12 ENCOUNTER — Other Ambulatory Visit: Payer: Self-pay

## 2020-02-12 ENCOUNTER — Encounter (HOSPITAL_BASED_OUTPATIENT_CLINIC_OR_DEPARTMENT_OTHER): Payer: Self-pay | Admitting: Orthopaedic Surgery

## 2020-02-12 DIAGNOSIS — E669 Obesity, unspecified: Secondary | ICD-10-CM | POA: Diagnosis not present

## 2020-02-12 DIAGNOSIS — Z6832 Body mass index (BMI) 32.0-32.9, adult: Secondary | ICD-10-CM | POA: Diagnosis not present

## 2020-02-12 DIAGNOSIS — F329 Major depressive disorder, single episode, unspecified: Secondary | ICD-10-CM | POA: Diagnosis not present

## 2020-02-12 DIAGNOSIS — E119 Type 2 diabetes mellitus without complications: Secondary | ICD-10-CM | POA: Diagnosis not present

## 2020-02-12 DIAGNOSIS — J449 Chronic obstructive pulmonary disease, unspecified: Secondary | ICD-10-CM | POA: Diagnosis not present

## 2020-02-12 DIAGNOSIS — E785 Hyperlipidemia, unspecified: Secondary | ICD-10-CM | POA: Diagnosis not present

## 2020-02-12 DIAGNOSIS — R9431 Abnormal electrocardiogram [ECG] [EKG]: Secondary | ICD-10-CM | POA: Insufficient documentation

## 2020-02-12 DIAGNOSIS — Z79899 Other long term (current) drug therapy: Secondary | ICD-10-CM | POA: Insufficient documentation

## 2020-02-12 DIAGNOSIS — Z7982 Long term (current) use of aspirin: Secondary | ICD-10-CM | POA: Insufficient documentation

## 2020-02-12 DIAGNOSIS — I110 Hypertensive heart disease with heart failure: Secondary | ICD-10-CM | POA: Insufficient documentation

## 2020-02-12 DIAGNOSIS — Z7951 Long term (current) use of inhaled steroids: Secondary | ICD-10-CM | POA: Diagnosis not present

## 2020-02-12 DIAGNOSIS — K219 Gastro-esophageal reflux disease without esophagitis: Secondary | ICD-10-CM | POA: Insufficient documentation

## 2020-02-12 DIAGNOSIS — Z794 Long term (current) use of insulin: Secondary | ICD-10-CM | POA: Diagnosis not present

## 2020-02-12 DIAGNOSIS — G5601 Carpal tunnel syndrome, right upper limb: Secondary | ICD-10-CM | POA: Diagnosis present

## 2020-02-12 DIAGNOSIS — I5032 Chronic diastolic (congestive) heart failure: Secondary | ICD-10-CM | POA: Insufficient documentation

## 2020-02-12 HISTORY — PX: CARPAL TUNNEL RELEASE: SHX101

## 2020-02-12 LAB — GLUCOSE, CAPILLARY
Glucose-Capillary: 50 mg/dL — ABNORMAL LOW (ref 70–99)
Glucose-Capillary: 93 mg/dL (ref 70–99)
Glucose-Capillary: 61 mg/dL — ABNORMAL LOW (ref 70–99)
Glucose-Capillary: 62 mg/dL — ABNORMAL LOW (ref 70–99)
Glucose-Capillary: 81 mg/dL (ref 70–99)
Glucose-Capillary: 145 mg/dL — ABNORMAL HIGH (ref 70–99)

## 2020-02-12 SURGERY — CARPAL TUNNEL RELEASE
Anesthesia: Regional | Site: Wrist | Laterality: Right

## 2020-02-12 MED ORDER — LIDOCAINE HCL (PF) 0.5 % IJ SOLN
INTRAMUSCULAR | Status: DC | PRN
  Administered 2020-02-12: 30 mL via INTRAVENOUS

## 2020-02-12 MED ORDER — ONDANSETRON HCL 4 MG/2ML IJ SOLN: 4.0000 mg | Freq: Once | INTRAMUSCULAR | Status: DC | PRN

## 2020-02-12 MED ORDER — HYDROCODONE-ACETAMINOPHEN 5-325 MG PO TABS: 1.0000 | ORAL_TABLET | Freq: Three times a day (TID) | ORAL | 0 refills | Status: DC | PRN

## 2020-02-12 MED ORDER — PROPOFOL 500 MG/50ML IV EMUL
INTRAVENOUS | Status: AC
  Filled 2020-02-12: qty 50

## 2020-02-12 MED ORDER — DEXTROSE 50 % IV SOLN
INTRAVENOUS | Status: AC
  Filled 2020-02-12: qty 50

## 2020-02-12 MED ORDER — ONDANSETRON HCL 4 MG/2ML IJ SOLN
INTRAMUSCULAR | Status: AC
  Filled 2020-02-12: qty 2

## 2020-02-12 MED ORDER — CEFAZOLIN SODIUM-DEXTROSE 2-4 GM/100ML-% IV SOLN
INTRAVENOUS | Status: AC
  Filled 2020-02-12: qty 100

## 2020-02-12 MED ORDER — ONDANSETRON HCL 4 MG PO TABS: 4.0000 mg | ORAL_TABLET | Freq: Three times a day (TID) | ORAL | 0 refills | Status: AC | PRN

## 2020-02-12 MED ORDER — CEFAZOLIN SODIUM-DEXTROSE 2-4 GM/100ML-% IV SOLN
2.0000 g | INTRAVENOUS | Status: AC
  Administered 2020-02-12: 2 g via INTRAVENOUS

## 2020-02-12 MED ORDER — FENTANYL CITRATE (PF) 100 MCG/2ML IJ SOLN: 25.0000 ug | INTRAMUSCULAR | Status: DC | PRN

## 2020-02-12 MED ORDER — DEXTROSE 50 % IV SOLN
25.0000 g | INTRAVENOUS | Status: AC
  Administered 2020-02-12: 12.5 g via INTRAVENOUS

## 2020-02-12 MED ORDER — FENTANYL CITRATE (PF) 100 MCG/2ML IJ SOLN
INTRAMUSCULAR | Status: DC | PRN
  Administered 2020-02-12: 50 ug via INTRAVENOUS

## 2020-02-12 MED ORDER — FENTANYL CITRATE (PF) 100 MCG/2ML IJ SOLN
INTRAMUSCULAR | Status: AC
  Filled 2020-02-12: qty 2

## 2020-02-12 MED ORDER — ACETAMINOPHEN 10 MG/ML IV SOLN: 1000.0000 mg | Freq: Once | INTRAVENOUS | Status: DC | PRN

## 2020-02-12 MED ORDER — LACTATED RINGERS IV SOLN: INTRAVENOUS | Status: DC

## 2020-02-12 MED ORDER — MIDAZOLAM HCL 2 MG/2ML IJ SOLN
INTRAMUSCULAR | Status: AC
  Filled 2020-02-12: qty 2

## 2020-02-12 MED ORDER — BUPIVACAINE HCL (PF) 0.25 % IJ SOLN
INTRAMUSCULAR | Status: AC
  Filled 2020-02-12: qty 30

## 2020-02-12 MED ORDER — MIDAZOLAM HCL 5 MG/5ML IJ SOLN
INTRAMUSCULAR | Status: DC | PRN
  Administered 2020-02-12: 1 mg via INTRAVENOUS

## 2020-02-12 MED ORDER — PROPOFOL 500 MG/50ML IV EMUL
INTRAVENOUS | Status: DC | PRN
  Administered 2020-02-12: 25 ug/kg/min via INTRAVENOUS

## 2020-02-12 MED ORDER — BUPIVACAINE HCL (PF) 0.25 % IJ SOLN
INTRAMUSCULAR | Status: DC | PRN
  Administered 2020-02-12: 5 mL

## 2020-02-12 SURGICAL SUPPLY — 47 items
BAND RUBBER #18 3X1/16 STRL (MISCELLANEOUS) ×6 IMPLANT
BLADE MINI RND TIP GREEN BEAV (BLADE) ×3 IMPLANT
BLADE SURG 15 STRL LF DISP TIS (BLADE) ×1 IMPLANT
BLADE SURG 15 STRL SS (BLADE) ×2
BNDG ELASTIC 3X5.8 VLCR STR LF (GAUZE/BANDAGES/DRESSINGS) ×3 IMPLANT
BNDG ESMARK 4X9 LF (GAUZE/BANDAGES/DRESSINGS) ×3 IMPLANT
BNDG PLASTER X FAST 3X3 WHT LF (CAST SUPPLIES) IMPLANT
BRUSH SCRUB EZ PLAIN DRY (MISCELLANEOUS) ×3 IMPLANT
CANISTER SUCT 1200ML W/VALVE (MISCELLANEOUS) ×3 IMPLANT
CORD BIPOLAR FORCEPS 12FT (ELECTRODE) ×3 IMPLANT
COVER BACK TABLE 60X90IN (DRAPES) ×3 IMPLANT
COVER MAYO STAND STRL (DRAPES) ×3 IMPLANT
COVER WAND RF STERILE (DRAPES) IMPLANT
CUFF TOURN SGL QUICK 18X4 (TOURNIQUET CUFF) IMPLANT
DECANTER SPIKE VIAL GLASS SM (MISCELLANEOUS) IMPLANT
DRAPE EXTREMITY T 121X128X90 (DISPOSABLE) ×3 IMPLANT
DRAPE IMP U-DRAPE 54X76 (DRAPES) ×3 IMPLANT
DRAPE SURG 17X23 STRL (DRAPES) ×3 IMPLANT
GAUZE 4X4 16PLY RFD (DISPOSABLE) IMPLANT
GAUZE SPONGE 4X4 12PLY STRL (GAUZE/BANDAGES/DRESSINGS) ×3 IMPLANT
GAUZE XEROFORM 1X8 LF (GAUZE/BANDAGES/DRESSINGS) ×3 IMPLANT
GLOVE BIOGEL PI IND STRL 7.0 (GLOVE) ×1 IMPLANT
GLOVE BIOGEL PI INDICATOR 7.0 (GLOVE) ×2
GLOVE ECLIPSE 7.0 STRL STRAW (GLOVE) ×3 IMPLANT
GLOVE SKINSENSE NS SZ7.5 (GLOVE) ×2
GLOVE SKINSENSE STRL SZ7.5 (GLOVE) ×1 IMPLANT
GLOVE SURG SYN 7.5  E (GLOVE) ×3
GLOVE SURG SYN 7.5 E (GLOVE) ×1 IMPLANT
GLOVE SURG SYN 7.5 PF PI (GLOVE) ×1 IMPLANT
GOWN STRL REIN XL XLG (GOWN DISPOSABLE) ×3 IMPLANT
GOWN STRL REUS W/ TWL XL LVL3 (GOWN DISPOSABLE) ×2 IMPLANT
GOWN STRL REUS W/TWL XL LVL3 (GOWN DISPOSABLE) ×4
NDL HYPO 25X1 1.5 SAFETY (NEEDLE) IMPLANT
NEEDLE HYPO 25X1 1.5 SAFETY (NEEDLE) IMPLANT
NS IRRIG 1000ML POUR BTL (IV SOLUTION) ×3 IMPLANT
PAD CAST 3X4 CTTN HI CHSV (CAST SUPPLIES) ×1 IMPLANT
PADDING CAST COTTON 3X4 STRL (CAST SUPPLIES) ×3
SET BASIN DAY SURGERY F.S. (CUSTOM PROCEDURE TRAY) ×3 IMPLANT
STOCKINETTE 4X48 STRL (DRAPES) ×3 IMPLANT
SUT ETHILON 3 0 PS 1 (SUTURE) ×3 IMPLANT
SYR BULB EAR ULCER 3OZ GRN STR (SYRINGE) ×3 IMPLANT
SYR CONTROL 10ML LL (SYRINGE) ×2 IMPLANT
TOWEL GREEN STERILE FF (TOWEL DISPOSABLE) ×3 IMPLANT
TRAY DSU PREP LF (CUSTOM PROCEDURE TRAY) ×3 IMPLANT
TUBE CONNECTING 20'X1/4 (TUBING)
TUBE CONNECTING 20X1/4 (TUBING) IMPLANT
UNDERPAD 30X36 HEAVY ABSORB (UNDERPADS AND DIAPERS) ×3 IMPLANT

## 2020-02-12 NOTE — Op Note (Signed)
   Carpal tunnel op note  DATE OF SURGERY:02/12/2020  PREOPERATIVE DIAGNOSIS:  Right carpal tunnel syndrome  POSTOPERATIVE DIAGNOSIS: same  PROCEDURE:  Right carpal tunnel release. CPT [phone]  SURGEON: Surgeon(s): Leandrew Koyanagi, MD  ASSIST: none  ANESTHESIA:  Regional  TOURNIQUET TIME: less than 20 minutes  BLOOD LOSS: Minimal.  COMPLICATIONS: None.  PATHOLOGY: None.  INDICATIONS: The patient is a 69 y.o. -year-old male who presented with carpal tunnel syndrome failing nonsurgical management, indicated for surgical release.  DESCRIPTION OF PROCEDURE: The patient was identified in the preoperative holding area.  The operative site was marked by the surgeon and confirmed by the patient.  He was brought back to the operating room.  Anesthesia was induced by the anesthesia team.  A well padded nonsterile tourniquet was placed. The operative extremity was prepped and draped in standard sterile fashion.  A timeout was performed.  Preoperative antibiotics were given.   A palmar incision was made about 5 mm ulnar to the thenar crease.  The palmar aponeurosis was exposed and divided in line with the skin incision. The palmaris brevis was visualized and divided.  The distal edge of the transcarpal ligament was identified. A hemostat was inserted into the carpal tunnel to protect the median nerve and the flexor tendons. Then, the transverse carpal ligament was released under direct visualization. Proximally, a subcutaneous tunnel was made allowing a Sewell retractor to be placed. Then, the distal portion of the antebrachial fascia was released. Distally, all fibrous bands were released. The median nerve was visualized, and the fat pad was exposed. Following release, local infiltration with 0.25% of Sensorcaine was given. The tourniquet was deflated. Hemostasis achieved.  Wound was irrigated and closed with 4-0 nylon sutures. Sterile dressing applied. The patient was transferred to the recovery room  in stable condition after all counts were correct.  POSTOPERATIVE PLAN: To start nerve gliding exercises as tolerated and no heavy lifting for four weeks.  Eduard Roux, [name] [facility] 8:55 AM

## 2020-02-12 NOTE — Transfer of Care (Signed)
Immediate Anesthesia Transfer of Care Note  Patient: Brett [name]  Procedure(s) Performed: RIGHT CARPAL TUNNEL RELEASE (Right Wrist)  Patient Location: PACU  Anesthesia Type:Bier block  Level of Consciousness: awake, alert  and oriented  Airway & Oxygen Therapy: Patient Spontanous Breathing and Patient connected to face mask oxygen  Post-op Assessment: Report given to RN and Post -op Vital signs reviewed and stable  Post vital signs: Reviewed and stable  Last Vitals:  Vitals Value Taken Time  BP    Temp    Pulse 55 02/12/20 0901  Resp 14 02/12/20 0901  SpO2 100 % 02/12/20 0901  Vitals shown include unvalidated device data.  Last Pain:  Vitals:   02/12/20 0710  TempSrc: Oral  PainSc: 7          Complications: No apparent anesthesia complications

## 2020-02-12 NOTE — H&P (Signed)
PREOPERATIVE H&P  Chief Complaint: right carpal tunnel syndrome  HPI: Brett [name] is a 69 y.o. male who presents for surgical treatment of right carpal tunnel syndrome.  He denies any changes in medical history.  Past Medical History:  Diagnosis Date  . Chronic diastolic heart failure ([place])   . COPD (chronic obstructive pulmonary disease) ([place])   . Depression   . Dextrocardia   . Diabetes mellitus   . GERD (gastroesophageal reflux disease)   . H. pylori infection 11/09/2012   treated with pylera  . HLD (hyperlipidemia)   . HTN (hypertension)    Cholesterol  . Iron deficiency anemia, unspecified 10/18/2012  . Neuropathy   . Pneumonia   . Situs inversus totalis   . Tachycardia    Past Surgical History:  Procedure Laterality Date  . BIOPSY  01/09/2020   Procedure: BIOPSY;  Surgeon: Daneil Dolin, MD;  [facility]: [facility];  Service: Endoscopy;;  . CATARACT EXTRACTION, BILATERAL  2016  . COLONOSCOPY WITH ESOPHAGOGASTRODUODENOSCOPY (EGD) N/A 11/09/2012   Dr. 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 hpylori on bx- treated with pylera. TCS- normal rectum. 1 diminutive polyp in the mid descending segment. 1-12mm polyp in the mid desending segment o/w the remainder of the colonic mucosa appeared normal. tubular adenoma on bx  . COLONOSCOPY WITH PROPOFOL N/A 01/09/2020   Procedure: COLONOSCOPY WITH PROPOFOL;  Surgeon: Daneil Dolin, MD;  Location: [facility];  Service: Endoscopy;  Laterality: N/A;  8:15am  . ESOPHAGOGASTRODUODENOSCOPY (EGD) WITH PROPOFOL N/A 01/09/2020   Procedure: ESOPHAGOGASTRODUODENOSCOPY (EGD) WITH PROPOFOL;  Surgeon: Daneil Dolin, MD;  [facility];  Service: Endoscopy;  Laterality: N/A;  . GIVENS CAPSULE STUDY N/A 10/07/2013   no source for anemia or heme positive stool noted  . NECK SURGERY     bone spurs  . POLYPECTOMY  01/09/2020   Procedure:  POLYPECTOMY;  Surgeon: Daneil Dolin, MD;  Location: [facility];  Service: Endoscopy;;   Social History   Socioeconomic History  . Marital status: Married    Spouse name: Not on file  . Number of children: 0  . Years of education: 7th grade   . Highest education level: Not on file  Occupational History  . Occupation: Biomedical scientist   Tobacco Use  . Smoking status: Never Smoker  . Smokeless tobacco: Current User    Types: Chew  Substance and Sexual Activity  . Alcohol use: No  . Drug use: No  . Sexual activity: Yes  Other Topics Concern  . Not on file  Social History Narrative  . Not on file   Social Determinants of Health   Financial Resource Strain:   . Difficulty of Paying Living Expenses:   Food Insecurity:   . Worried About Charity fundraiser in the Last Year:   . Arboriculturist in the Last Year:   Transportation Needs:   . Film/video editor (Medical):   Marland Kitchen Lack of Transportation (Non-Medical):   Physical Activity:   . Days of Exercise per Week:   . Minutes of Exercise per Session:   Stress:   . Feeling of Stress :   Social Connections:   . Frequency of Communication with Friends and Family:   . Frequency of Social Gatherings with Friends and Family:   . Attends Religious Services:   . Active Member of Clubs or Organizations:   . Attends Club or  Organization Meetings:   Marland Kitchen Marital Status:    Family History  Problem Relation Age of Onset  . Heart attack Mother   . Diabetes Sister        Fx  . Heart defect Sister        Fx  . Arthritis Sister        Fx  . Asthma Sister        Fx  . Kidney disease Sister        Fx  . Pancreatitis Sister   . Colon cancer Neg Hx    No Known Allergies Prior to Admission medications   Medication Sig Start Date End Date Taking? Authorizing Provider  albuterol (PROAIR HFA) 108 (90 Base) MCG/ACT inhaler Inhale 2 puffs into the lungs every 4 (four) hours as needed for wheezing or shortness of breath. 09/27/16  Yes  Valentina Shaggy, MD  aspirin EC 81 MG tablet Take 81 mg by mouth daily.   Yes [provider]  budesonide-formoterol (SYMBICORT) 160-4.5 MCG/ACT inhaler Inhale 2 puffs into the lungs 2 (two) times daily. 05/21/19  Yes Johnson, Clanford L, MD  calcium-vitamin D (OSCAL WITH D) 500-200 MG-UNIT tablet Take 1 tablet by mouth.   Yes [provider]  cetirizine (ZYRTEC) 10 MG tablet Take 10 mg by mouth daily.   Yes [provider]  Cinnamon 500 MG capsule Take 2,000 mg by mouth daily.    Yes [provider]  co-enzyme Q-10 30 MG capsule Take 30 mg by mouth 3 (three) times daily.   Yes [provider]  DULoxetine (CYMBALTA) 60 MG capsule Take 60 mg by mouth daily.   Yes [provider]  fenofibrate (TRICOR) 145 MG tablet Take 145 mg by mouth every evening.  05/28/18  Yes [provider]  ferrous sulfate 325 (65 FE) MG tablet Take 325-650 mg by mouth See admin instructions. Take two tablets on Monday Wednesday, Friday. And 1 tablet on all other days   Yes [provider]  folic acid (FOLVITE) 1 MG tablet Take 1 mg by mouth daily.   Yes [provider]  furosemide (LASIX) 40 MG tablet TAKE 1 AND 1/2 TABLET IN THE MORNING AND 1 TABLET IN THE EVENING. Patient taking differently: Take 40-60 mg by mouth See admin instructions. 60 mg in the  Morning,40 mg in the evening 12/13/19  Yes Branch, Alphonse Guild, MD  gabapentin (NEURONTIN) 600 MG tablet Take 1,200 mg by mouth 3 (three) times daily.   Yes [provider]  guaiFENesin (MUCINEX) 600 MG 12 hr tablet Take 600 mg by mouth 1 day or 1 dose.   Yes [provider]  insulin detemir (LEVEMIR) 100 UNIT/ML injection Inject 60 Units into the skin at bedtime.    Yes [provider]  LINZESS 145 MCG CAPS capsule TAKE ONE CAPSULE BY MOUTH ONCE DAILY BEFORE BREAKFAST. Patient taking differently: Take 145 mcg by mouth daily before breakfast.  12/17/19  Yes Annitta Needs,  NP  losartan (COZAAR) 100 MG tablet Take 100 mg by mouth daily.   Yes [provider]  metFORMIN (GLUCOPHAGE) 1000 MG tablet Take 1,000 mg by mouth 2 (two) times daily with a meal.   Yes [provider]  metoprolol tartrate (LOPRESSOR) 25 MG tablet Take 1 tablet (25 mg total) by mouth 2 (two) times daily. 03/28/19  Yes BranchAlphonse Guild, MD  Multiple Vitamin (MULTIVITAMIN) tablet Take 1 tablet by mouth daily.   Yes [provider]  pantoprazole (PROTONIX) 40 MG tablet Take 1 tablet (40 mg total) by mouth daily. 10/21/19  Yes Aliene Altes S, PA-C  pioglitazone (ACTOS) 45 MG tablet Take 45 mg by mouth daily.   Yes [provider]  rOPINIRole (REQUIP) 2 MG tablet Take 2 mg by mouth at bedtime.    Yes [provider]  rosuvastatin (CRESTOR) 40 MG tablet Take 40 mg by mouth every evening.  03/22/18  Yes [provider]  sitaGLIPtin (JANUVIA) 50 MG tablet Take 50 mg by mouth daily.    Yes [provider]  traMADol (ULTRAM) 50 MG tablet Take 50-100 mg by mouth every 6 (six) hours as needed for moderate pain.  07/25/18  Yes [provider]  traZODone (DESYREL) 50 MG tablet Take 150 mg by mouth at bedtime.  07/03/18  Yes [provider]     Positive ROS: All other systems have been reviewed and were otherwise negative with the exception of those mentioned in the HPI and as above.  Physical Exam: General: Alert, no acute distress Cardiovascular: No pedal edema Respiratory: No cyanosis, no use of accessory musculature GI: abdomen soft Skin: No lesions in the area of chief complaint Neurologic: Sensation intact distally Psychiatric: Patient is competent for consent with normal mood and affect Lymphatic: no lymphedema  MUSCULOSKELETAL: exam stable  Assessment: right carpal tunnel syndrome  Plan: Plan for Procedure(s): RIGHT CARPAL TUNNEL RELEASE  The risks benefits and alternatives were discussed with the patient  including but not limited to the risks of nonoperative treatment, versus surgical intervention including infection, bleeding, nerve injury,  blood clots, cardiopulmonary complications, morbidity, mortality, among others, and they were willing to proceed.   Preoperative templating of the joint replacement has been completed, documented, and submitted to the Operating Room personnel in order to optimize intra-operative equipment management.   Eduard Roux, MD 02/12/2020 8:10 AM

## 2020-02-12 NOTE — Anesthesia Postprocedure Evaluation (Signed)
Anesthesia Post Note  Patient: Brett [name]  Procedure(s) Performed: RIGHT CARPAL TUNNEL RELEASE (Right Wrist)     Patient location during evaluation: PACU Anesthesia Type: Bier Block Level of consciousness: awake and alert Pain management: pain level controlled Vital Signs Assessment: post-procedure vital signs reviewed and stable Respiratory status: spontaneous breathing, nonlabored ventilation, respiratory function stable and patient connected to nasal cannula oxygen Cardiovascular status: stable and blood pressure returned to baseline Postop Assessment: no apparent nausea or vomiting Anesthetic complications: no    Last Vitals:  Vitals:   02/12/20 0915 02/12/20 0930  BP: (!) 123/55 (!) 127/54  Pulse: (!) 52 (!) 52  Resp: 11 14  Temp:    SpO2: 98% 100%    Last Pain:  Vitals:   02/12/20 0930  TempSrc:   PainSc: 0-No pain                 Barnet Glasgow

## 2020-02-12 NOTE — Discharge Instructions (Signed)
 Postoperative instructions:  Weightbearing instructions: no lifting more than 10 lbs for 4 weeks  Dressing instructions: Keep your dressing and/or splint clean and dry at all times.  It will be removed at your first post-operative appointment.  Your stitches and/or staples will be removed at this visit.  Incision instructions:  Do not soak your incision for 3 weeks after surgery.  If the incision gets wet, pat dry and do not scrub the incision.  Pain control:  You have been given a prescription to be taken as directed for post-operative pain control.  In addition, elevate the operative extremity above the heart at all times to prevent swelling and throbbing pain.  Take over-the-counter Colace, 100mg by mouth twice a day while taking narcotic pain medications to help prevent constipation.  Follow up appointments: 1) 7 days for suture removal and wound check. 2) Dr. Xu as scheduled.   -------------------------------------------------------------------------------------------------------------  After Surgery Pain Control:  After your surgery, post-surgical discomfort or pain is likely. This discomfort can last several days to a few weeks. At certain times of the day your discomfort may be more intense.  Did you receive a nerve block?  A nerve block can provide pain relief for one hour to two days after your surgery. As long as the nerve block is working, you will experience little or no sensation in the area the surgeon operated on.  As the nerve block wears off, you will begin to experience pain or discomfort. It is very important that you begin taking your prescribed pain medication before the nerve block fully wears off. Treating your pain at the first sign of the block wearing off will ensure your pain is better controlled and more tolerable when full-sensation returns. Do not wait until the pain is intolerable, as the medicine will be less effective. It is better to treat pain in advance  than to try and catch up.  General Anesthesia:  If you did not receive a nerve block during your surgery, you will need to start taking your pain medication shortly after your surgery and should continue to do so as prescribed by your surgeon.  Pain Medication:  Most commonly we prescribe Vicodin and Percocet for post-operative pain. Both of these medications contain a combination of acetaminophen (Tylenol) and a narcotic to help control pain.   It takes between 30 and 45 minutes before pain medication starts to work. It is important to take your medication before your pain level gets too intense.   Nausea is a common side effect of many pain medications. You will want to eat something before taking your pain medicine to help prevent nausea.   If you are taking a prescription pain medication that contains acetaminophen, we recommend that you do not take additional over the counter acetaminophen (Tylenol).  Other pain relieving options:   Using a cold pack to ice the affected area a few times a day (15 to 20 minutes at a time) can help to relieve pain, reduce swelling and bruising.   Elevation of the affected area can also help to reduce pain and swelling.   Post Anesthesia Home Care Instructions  Activity: Get plenty of rest for the remainder of the day. A responsible individual must stay with you for 24 hours following the procedure.  For the next 24 hours, DO NOT: -Drive a car -Operate machinery -Drink alcoholic beverages -Take any medication unless instructed by your physician -Make any legal decisions or sign important papers.  Meals: Start with   liquid foods such as gelatin or soup. Progress to regular foods as tolerated. Avoid greasy, spicy, heavy foods. If nausea and/or vomiting occur, drink only clear liquids until the nausea and/or vomiting subsides. Call your physician if vomiting continues.  Special Instructions/Symptoms: Your throat may feel dry or sore from the  anesthesia or the breathing tube placed in your throat during surgery. If this causes discomfort, gargle with warm salt water. The discomfort should disappear within 24 hours.  If you had a scopolamine patch placed behind your ear for the management of post- operative nausea and/or vomiting:  1. The medication in the patch is effective for 72 hours, after which it should be removed.  Wrap patch in a tissue and discard in the trash. Wash hands thoroughly with soap and water. 2. You may remove the patch earlier than 72 hours if you experience unpleasant side effects which may include dry mouth, dizziness or visual disturbances. 3. Avoid touching the patch. Wash your hands with soap and water after contact with the patch.     

## 2020-02-13 ENCOUNTER — Encounter: Payer: Self-pay | Admitting: *Deleted

## 2020-02-19 ENCOUNTER — Inpatient Hospital Stay (HOSPITAL_COMMUNITY): Payer: Medicare HMO

## 2020-02-20 ENCOUNTER — Ambulatory Visit (INDEPENDENT_AMBULATORY_CARE_PROVIDER_SITE_OTHER): Payer: Medicare HMO | Admitting: Orthopaedic Surgery

## 2020-02-20 ENCOUNTER — Encounter: Payer: Self-pay | Admitting: Orthopaedic Surgery

## 2020-02-20 ENCOUNTER — Encounter: Payer: Self-pay | Admitting: Gastroenterology

## 2020-02-20 DIAGNOSIS — G5601 Carpal tunnel syndrome, right upper limb: Secondary | ICD-10-CM

## 2020-02-20 NOTE — Progress Notes (Signed)
Referring Provider: [name], MD Primary Care Physician:  [name], MD Primary GI Physician: Dr. Gala Romney  Chief Complaint  Patient presents with  . Anemia    pp f/u    HPI:   Brett [name] is a 69 y.o. male with a history of  Kartagener syndrome,diabetes, hypertension,COPDwith chronic respiratory failure on supplemental O2,diastolic CHF, GERD, H. Pylori s/p treatment with pylera, iron deficiency anemiawith extensive GI work-up in2014/2015 including EGD, TCS, and Givens capsulewithout explanation followed by hematology/oncology.   During hospitalization in October 2020, he was noted to have a drop in hemoglobin to 8.3, down from 12.5.  Iron panel with ferritin 182, iron 30, saturation 7%.  No overt GI bleeding.  He received Feraheme inpatient and hemoglobin remained stable.  He desired outpatient endoscopic evaluation.  Last seen in our office in February 2021 to follow-up on IDA, GERD, and constipation.  GERD was well controlled.  He had run out of his PPI and needed a refill.  Constipation was doing well with Linzess 145 mcg.  Occasional black stools on iron.  No blood in the stool.  Taking 2 Aleve a day.  He was to continue his current medications (Protonix and Linzess) and was scheduled for colonoscopy with possible upper endoscopy for further evaluation of declining hemoglobin.  CBC was also ordered.  CBC completed 01/03/2020 with hemoglobin improved to 12.2. Most recent labs 01/07/2020.  Hemoglobin 11.1, iron 63, saturation ratio 12%, ferritin 134.  Procedures 01/09/2020: Colonoscopy: Four 3-5 mm polyps in the hepatic flexure and cecum resected and retrieved, otherwise normal exam.  Pathology with tubular adenomas.  Recommended repeat colonoscopy in 3 years. EGD: Normal esophagus, normal stomach biopsied to prove eradication of H. pylori, normal duodenal bulb and second portion of the duodenum.  Biopsy with mild chronic inflammation, negative for H. Pylori.  OV with Dr.  Delton Coombes 11/20/2019.  Plans to continue oral iron, follow-up in 3 months and recheck labs including CBC, iron panel, G83, and folic acid levels.  Today: Having trouble with his legs. Was having trouble with his vision. Was told he had sugar behind his eyes. Stomach is doing great. Bowels move every morning. No blood in the stool. Stools are dark on iron, but not completely black. No abdominal pain. No N/V. No GERD symptoms on Protonix daily. No trouble swallowing.   SOB is at baseline. No worsening. Chronic Cough productive of phlegm. Clear. No chest pain or palpitations. Weight is up.  Wife feels this may be secondary to fluid accumulation.  Reports his weight fluctuates up and down.  Taking Tylenol as needed. No NSAIDs.   Past Medical History:  Diagnosis Date  . Chronic diastolic heart failure ([place])   . COPD (chronic obstructive pulmonary disease) ([place])   . Depression   . Dextrocardia   . Diabetes mellitus   . GERD (gastroesophageal reflux disease)   . H. pylori infection 11/09/2012   treated with pylera  . HLD (hyperlipidemia)   . HTN (hypertension)    Cholesterol  . Iron deficiency anemia, unspecified 10/18/2012  . Neuropathy   . Pneumonia   . Situs inversus totalis   . Tachycardia     Past Surgical History:  Procedure Laterality Date  . BIOPSY  01/09/2020   Procedure: BIOPSY;  Surgeon: Daneil Dolin, MD;  [facility];  Service: Endoscopy;;  . CARPAL TUNNEL RELEASE Right 02/12/2020   Procedure: RIGHT CARPAL TUNNEL RELEASE;  Surgeon: Leandrew Koyanagi, MD;  [facility];  Service: Orthopedics;  Laterality: Right;  . CATARACT EXTRACTION, BILATERAL  2016  . COLONOSCOPY WITH ESOPHAGOGASTRODUODENOSCOPY (EGD) N/A 11/09/2012   Dr. 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 hpylori on bx- treated with pylera. TCS- normal rectum. 1 diminutive polyp in the mid descending  segment. 1-62mm polyp in the mid desending segment o/w the remainder of the colonic mucosa appeared normal. tubular adenoma on bx  . COLONOSCOPY WITH PROPOFOL N/A 01/09/2020   Procedure: COLONOSCOPY WITH PROPOFOL;  Surgeon: Daneil Dolin, MD;  Four 3-5 mm polyps in the hepatic flexure and cecum resected and retrieved, otherwise normal exam.  Pathology with tubular adenomas.  Recommended repeat colonoscopy in 3 years.  . ESOPHAGOGASTRODUODENOSCOPY (EGD) WITH PROPOFOL N/A 01/09/2020   Procedure: ESOPHAGOGASTRODUODENOSCOPY (EGD) WITH PROPOFOL;  Surgeon: Daneil Dolin, MD; normal exam.  Negative for H. pylori   . GIVENS CAPSULE STUDY N/A 10/07/2013   no source for anemia or heme positive stool noted  . NECK SURGERY     bone spurs  . POLYPECTOMY  01/09/2020   Procedure: POLYPECTOMY;  Surgeon: Daneil Dolin, MD;  [facility];  Service: Endoscopy;;    Current Outpatient Medications  Medication Sig Dispense Refill  . albuterol (PROAIR HFA) 108 (90 Base) MCG/ACT inhaler Inhale 2 puffs into the lungs every 4 (four) hours as needed for wheezing or shortness of breath. 1 Inhaler 2  . aspirin EC 81 MG tablet Take 81 mg by mouth daily.    . budesonide-formoterol (SYMBICORT) 160-4.5 MCG/ACT inhaler Inhale 2 puffs into the lungs 2 (two) times daily. 1 Inhaler 5  . calcium-vitamin D (OSCAL WITH D) 500-200 MG-UNIT tablet Take 1 tablet by mouth.    . cetirizine (ZYRTEC) 10 MG tablet Take 10 mg by mouth daily.    . Cinnamon 500 MG capsule Take 2,000 mg by mouth daily.     Marland Kitchen co-enzyme Q-10 30 MG capsule Take 30 mg by mouth 3 (three) times daily.    . DULoxetine (CYMBALTA) 60 MG capsule Take 60 mg by mouth daily.    . fenofibrate (TRICOR) 145 MG tablet Take 145 mg by mouth every evening.     . ferrous sulfate 325 (65 FE) MG tablet Take 325-650 mg by mouth See admin instructions. Take two tablets on Monday Wednesday, Friday. And 1 tablet on all other days    . folic acid (FOLVITE) 1 MG tablet Take 1  mg by mouth daily.    . furosemide (LASIX) 40 MG tablet TAKE 1 AND 1/2 TABLET IN THE MORNING AND 1 TABLET IN THE EVENING. (Patient taking differently: Take 40-60 mg by mouth See admin instructions. 60 mg in the  Morning,40 mg in the evening) 225 tablet 1  . gabapentin (NEURONTIN) 600 MG tablet Take 1,200 mg by mouth 3 (three) times daily.    Marland Kitchen guaiFENesin (MUCINEX) 600 MG 12 hr tablet Take 600 mg by mouth 1 day or 1 dose.    Marland Kitchen HYDROcodone-acetaminophen (NORCO) 5-325 MG tablet Take 1-2 tablets by mouth 3 (three) times daily as needed. 30 tablet 0  . insulin detemir (LEVEMIR) 100 UNIT/ML injection Inject 60 Units into the skin at bedtime.     Marland Kitchen LINZESS 145 MCG CAPS capsule TAKE ONE CAPSULE BY MOUTH ONCE DAILY BEFORE BREAKFAST. (Patient taking differently: Take 145 mcg by mouth daily before breakfast. ) 90 capsule 3  . losartan (COZAAR) 100 MG tablet Take 100 mg by mouth daily.    Marland Kitchen  metFORMIN (GLUCOPHAGE) 1000 MG tablet Take 1,000 mg by mouth 2 (two) times daily with a meal.    . metoprolol tartrate (LOPRESSOR) 25 MG tablet Take 1 tablet (25 mg total) by mouth 2 (two) times daily. 180 tablet 3  . Multiple Vitamin (MULTIVITAMIN) tablet Take 1 tablet by mouth daily.    . ondansetron (ZOFRAN) 4 MG tablet Take 1-2 tablets (4-8 mg total) by mouth every 8 (eight) hours as needed for nausea or vomiting. 20 tablet 0  . pantoprazole (PROTONIX) 40 MG tablet Take 1 tablet (40 mg total) by mouth daily. 30 tablet 11  . pioglitazone (ACTOS) 45 MG tablet Take 45 mg by mouth daily.    Marland Kitchen rOPINIRole (REQUIP) 2 MG tablet Take 2 mg by mouth at bedtime.     . rosuvastatin (CRESTOR) 40 MG tablet Take 40 mg by mouth every evening.     . sitaGLIPtin (JANUVIA) 50 MG tablet Take 50 mg by mouth daily.     . traMADol (ULTRAM) 50 MG tablet Take 50-100 mg by mouth every 6 (six) hours as needed for moderate pain.     . traZODone (DESYREL) 50 MG tablet Take 150 mg by mouth at bedtime.      No current facility-administered  medications for this visit.    Allergies as of 02/21/2020  . (No Known Allergies)    Family History  Problem Relation Age of Onset  . Heart attack Mother   . Diabetes Sister        Fx  . Heart defect Sister        Fx  . Arthritis Sister        Fx  . Asthma Sister        Fx  . Kidney disease Sister        Fx  . Pancreatitis Sister   . Colon cancer Neg Hx     Social History   Socioeconomic History  . Marital status: Married    Spouse name: Not on file  . Number of children: 0  . Years of education: 7th grade   . Highest education level: Not on file  Occupational History  . Occupation: Biomedical scientist   Tobacco Use  . Smoking status: Never Smoker  . Smokeless tobacco: Current User    Types: Chew  Vaping Use  . Vaping Use: Never used  Substance and Sexual Activity  . Alcohol use: No  . Drug use: No  . Sexual activity: Yes  Other Topics Concern  . Not on file  Social History Narrative  . Not on file   Social Determinants of Health   Financial Resource Strain:   . Difficulty of Paying Living Expenses:   Food Insecurity:   . Worried About Charity fundraiser in the Last Year:   . Arboriculturist in the Last Year:   Transportation Needs:   . Film/video editor (Medical):   Marland Kitchen Lack of Transportation (Non-Medical):   Physical Activity:   . Days of Exercise per Week:   . Minutes of Exercise per Session:   Stress:   . Feeling of Stress :   Social Connections:   . Frequency of Communication with Friends and Family:   . Frequency of Social Gatherings with Friends and Family:   . Attends Religious Services:   . Active Member of Clubs or Organizations:   . Attends Archivist Meetings:   Marland Kitchen Marital Status:     Review of Systems: Gen: Denies fever, chills,  lightheadedness, dizziness, presyncope, syncope CV: See HPI Resp: See HPI  GI: See HPI Heme: See HPI  Physical Exam: BP (!) 103/47   Pulse 68   Temp (!) 97.3 F (36.3 C) (Oral)   Ht 5' 7.5"  (1.715 m)   Wt 225 lb 9.6 oz (102.3 kg)   BMI 34.81 kg/m  General:   Alert and oriented. No distress noted. Pleasant and cooperative.  Head:  Normocephalic and atraumatic. Eyes:  Conjuctiva clear without scleral icterus. Heart:  S1, S2 present without murmurs appreciated. Lungs:  Clear to auscultation bilaterally. No wheezes, rales, or rhonchi. No distress.  Nasal cannula in place. Abdomen: +BS, soft, non-tender and non-distended. No rebound or guarding. No HSM or masses noted. Msk:  Symmetrical without gross deformities. Normal posture. Extremities:  With minimal LE edema. Neurologic:  Alert and  oriented x4 Psych:  Normal mood and affect.

## 2020-02-20 NOTE — Progress Notes (Signed)
Post-Op Visit Note   Patient: Brett [name]           Date of Birth: 03/08/1951           MRN: [id number] Visit Date: 02/20/2020 PCP: [name], MD   Assessment & Plan:  Chief Complaint:  Chief Complaint  Patient presents with  . Right Hand - Routine Post Op, Pain   Visit Diagnoses:  1. Right carpal tunnel syndrome     Plan: Mr. Tuch is 8 days status post right carpal tunnel release. He is doing well overall and has no complaints. Surgical incision is clean dry and intact without any signs of infection. Neurovascular intact. We remove the bandages today and place him in a Velcro brace. We will have him follow-up next week for suture removal.  Follow-Up Instructions: Return in about 1 week (around 02/27/2020).   Orders:  No orders of the defined types were placed in this encounter.  No orders of the defined types were placed in this encounter.   Imaging: No results found.  PMFS History: Patient Active Problem List   Diagnosis Date Noted  . Right carpal tunnel syndrome 12/20/2019  . Left carpal tunnel syndrome 12/20/2019  . History of adenomatous polyp of colon 10/21/2019  . Class 1 obesity due to excess calories with body mass index (BMI) of 32.0 to 32.9 in adult   . Acute hypoxemic respiratory failure ([place]) 07/09/2019  . Anemia   . Acute on chronic respiratory failure ([place]) 07/08/2019  . Acute respiratory failure with hypoxia ([place]) 05/19/2019  . Iron deficiency anemia 05/19/2019  . Leukocytosis 05/19/2019  . D-dimer, elevated 05/19/2019  . Dextrocardia   . Chewing tobacco nicotine dependence   . Constipation 07/19/2018  . 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 Chronic respiratory failure with hypoxia ([place]) 07/16/2018  . HTN (hypertension) 07/16/2018  . Acute on chronic respiratory failure with hypoxia ([place]) 04/21/2018  . COPD exacerbation ([place]) 04/21/2018  . SVT (supraventricular tachycardia) ([place]) 04/14/2018  . Paroxysmal SVT (supraventricular tachycardia)  ([place]) 04/14/2018  . Chronic diastolic CHF (congestive heart failure) ([place]) 04/14/2018  . CHF exacerbation ([place]) 04/10/2018  . 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 Restrictive lung disease 11/08/2016  . 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 Moderate persistent asthma, uncomplicated 36/62/9476  . Abdominal pain 09/23/2014  . Gastritis, Helicobacter pylori 54/65/0354  . Insulin-requiring or dependent type II diabetes mellitus ([place]) 01/03/2014  . 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 GERD (gastroesophageal reflux disease) 10/18/2012  . Nausea 10/18/2012  . Iron deficiency anemia due to chronic blood loss 10/18/2012  . HIP PAIN 08/31/2010  . SPINAL STENOSIS 06/28/2010  . TENDINITIS, CALCIFIC, SHOULDER, RIGHT 06/14/2010  . IMPINGEMENT SYNDROME 06/14/2010   Past Medical History:  Diagnosis Date  . Chronic diastolic heart failure ([place])   . COPD (chronic obstructive pulmonary disease) ([place])   . Depression   . Dextrocardia   . Diabetes mellitus   . GERD (gastroesophageal reflux disease)   . H. pylori infection 11/09/2012   treated with pylera  . HLD (hyperlipidemia)   . HTN (hypertension)    Cholesterol  . Iron deficiency anemia, unspecified 10/18/2012  . Neuropathy   . Pneumonia   . Situs inversus totalis   . Tachycardia     Family History  Problem Relation Age of Onset  . Heart attack Mother   . Diabetes Sister        Fx  . Heart defect Sister        Fx  .  Arthritis Sister        Fx  . Asthma Sister        Fx  . Kidney disease Sister        Fx  . Pancreatitis Sister   . Colon cancer Neg Hx     Past Surgical History:  Procedure Laterality Date  . BIOPSY  01/09/2020   Procedure: BIOPSY;  Surgeon: Daneil Dolin, MD;  [facility];  Service: Endoscopy;;  . CARPAL TUNNEL RELEASE Right 02/12/2020   Procedure: RIGHT CARPAL TUNNEL RELEASE;  Surgeon: Leandrew Koyanagi, MD;  Location: [facility];  Service: Orthopedics;  Laterality: Right;  . CATARACT  EXTRACTION, BILATERAL  2016  . COLONOSCOPY WITH ESOPHAGOGASTRODUODENOSCOPY (EGD) N/A 11/09/2012   Dr. 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 hpylori on bx- treated with pylera. TCS- normal rectum. 1 diminutive polyp in the mid descending segment. 1-57mm polyp in the mid desending segment o/w the remainder of the colonic mucosa appeared normal. tubular adenoma on bx  . COLONOSCOPY WITH PROPOFOL N/A 01/09/2020   Procedure: COLONOSCOPY WITH PROPOFOL;  Surgeon: Daneil Dolin, MD;  Four 3-5 mm polyps in the hepatic flexure and cecum resected and retrieved, otherwise normal exam.  Pathology with tubular adenomas.  Recommended repeat colonoscopy in 3 years.  . ESOPHAGOGASTRODUODENOSCOPY (EGD) WITH PROPOFOL N/A 01/09/2020   Procedure: ESOPHAGOGASTRODUODENOSCOPY (EGD) WITH PROPOFOL;  Surgeon: Daneil Dolin, MD; normal exam.  Negative for H. pylori   . GIVENS CAPSULE STUDY N/A 10/07/2013   no source for anemia or heme positive stool noted  . NECK SURGERY     bone spurs  . POLYPECTOMY  01/09/2020   Procedure: POLYPECTOMY;  Surgeon: Daneil Dolin, MD;  [facility];  Service: Endoscopy;;   Social History   Occupational History  . Occupation: Biomedical scientist   Tobacco Use  . Smoking status: Never Smoker  . Smokeless tobacco: Current User    Types: Chew  Vaping Use  . Vaping Use: Never used  Substance and Sexual Activity  . Alcohol use: No  . Drug use: No  . Sexual activity: Yes

## 2020-02-21 ENCOUNTER — Ambulatory Visit (INDEPENDENT_AMBULATORY_CARE_PROVIDER_SITE_OTHER): Payer: Medicare HMO | Admitting: Gastroenterology

## 2020-02-21 ENCOUNTER — Other Ambulatory Visit: Payer: Self-pay

## 2020-02-21 ENCOUNTER — Encounter: Payer: Self-pay | Admitting: Gastroenterology

## 2020-02-21 ENCOUNTER — Inpatient Hospital Stay (HOSPITAL_COMMUNITY): Payer: Medicare HMO | Attending: Hematology

## 2020-02-21 VITALS — BP 103/47 | HR 68 | Temp 97.3°F | Ht 67.5 in | Wt 225.6 lb

## 2020-02-21 DIAGNOSIS — I10 Essential (primary) hypertension: Secondary | ICD-10-CM | POA: Diagnosis not present

## 2020-02-21 DIAGNOSIS — K5900 Constipation, unspecified: Secondary | ICD-10-CM

## 2020-02-21 DIAGNOSIS — E119 Type 2 diabetes mellitus without complications: Secondary | ICD-10-CM | POA: Diagnosis not present

## 2020-02-21 DIAGNOSIS — D72829 Elevated white blood cell count, unspecified: Secondary | ICD-10-CM | POA: Insufficient documentation

## 2020-02-21 DIAGNOSIS — J449 Chronic obstructive pulmonary disease, unspecified: Secondary | ICD-10-CM | POA: Diagnosis not present

## 2020-02-21 DIAGNOSIS — E785 Hyperlipidemia, unspecified: Secondary | ICD-10-CM | POA: Diagnosis not present

## 2020-02-21 DIAGNOSIS — D509 Iron deficiency anemia, unspecified: Secondary | ICD-10-CM | POA: Insufficient documentation

## 2020-02-21 DIAGNOSIS — D649 Anemia, unspecified: Secondary | ICD-10-CM

## 2020-02-21 DIAGNOSIS — Z79899 Other long term (current) drug therapy: Secondary | ICD-10-CM | POA: Insufficient documentation

## 2020-02-21 DIAGNOSIS — E871 Hypo-osmolality and hyponatremia: Secondary | ICD-10-CM | POA: Diagnosis not present

## 2020-02-21 DIAGNOSIS — R000 Tachycardia, unspecified: Secondary | ICD-10-CM | POA: Insufficient documentation

## 2020-02-21 DIAGNOSIS — I509 Heart failure, unspecified: Secondary | ICD-10-CM | POA: Diagnosis not present

## 2020-02-21 DIAGNOSIS — K219 Gastro-esophageal reflux disease without esophagitis: Secondary | ICD-10-CM | POA: Insufficient documentation

## 2020-02-21 DIAGNOSIS — G629 Polyneuropathy, unspecified: Secondary | ICD-10-CM | POA: Diagnosis not present

## 2020-02-21 DIAGNOSIS — D500 Iron deficiency anemia secondary to blood loss (chronic): Secondary | ICD-10-CM

## 2020-02-21 DIAGNOSIS — F329 Major depressive disorder, single episode, unspecified: Secondary | ICD-10-CM | POA: Diagnosis not present

## 2020-02-21 DIAGNOSIS — Z7982 Long term (current) use of aspirin: Secondary | ICD-10-CM | POA: Diagnosis not present

## 2020-02-21 LAB — CBC WITH DIFFERENTIAL/PLATELET
Abs Immature Granulocytes: 0.03 10*3/uL (ref 0.00–0.07)
Basophils Absolute: 0 10*3/uL (ref 0.0–0.1)
Basophils Relative: 0 %
Eosinophils Absolute: 0.1 10*3/uL (ref 0.0–0.5)
Eosinophils Relative: 1 %
HCT: 30.6 % — ABNORMAL LOW (ref 39.0–52.0)
Hemoglobin: 9.4 g/dL — ABNORMAL LOW (ref 13.0–17.0)
Immature Granulocytes: 0 %
Lymphocytes Relative: 9 %
Lymphs Abs: 0.9 10*3/uL (ref 0.7–4.0)
MCH: 29 pg (ref 26.0–34.0)
MCHC: 30.7 g/dL (ref 30.0–36.0)
MCV: 94.4 fL (ref 80.0–100.0)
Monocytes Absolute: 0.8 10*3/uL (ref 0.1–1.0)
Monocytes Relative: 8 %
Neutro Abs: 7.5 10*3/uL (ref 1.7–7.7)
Neutrophils Relative %: 82 %
Platelets: 355 10*3/uL (ref 150–400)
RBC: 3.24 MIL/uL — ABNORMAL LOW (ref 4.22–5.81)
RDW: 14.1 % (ref 11.5–15.5)
WBC: 9.3 10*3/uL (ref 4.0–10.5)
nRBC: 0 % (ref 0.0–0.2)

## 2020-02-21 LAB — VITAMIN B12: Vitamin B-12: 255 pg/mL (ref 180–914)

## 2020-02-21 LAB — IRON AND TIBC
Iron: 22 ug/dL — ABNORMAL LOW (ref 45–182)
Saturation Ratios: 4 % — ABNORMAL LOW (ref 17.9–39.5)
TIBC: 501 ug/dL — ABNORMAL HIGH (ref 250–450)
UIBC: 479 ug/dL

## 2020-02-21 LAB — FOLATE: Folate: >100 ng/mL (ref >5.9)

## 2020-02-21 LAB — FERRITIN: Ferritin: 98 ng/mL (ref 24–336)

## 2020-02-21 NOTE — Assessment & Plan Note (Signed)
Well-controlled on Protonix 40 mg daily.  No alarm symptoms.  Recommend he continue his current medications and follow-up in 4 months.

## 2020-02-21 NOTE — Assessment & Plan Note (Addendum)
Chronic history of iron deficiency with significant GI work-up in 2014/2015 including EGD, TCS, and Givens capsule without explanation; he has been following with hematology/oncology since.  During hospitalization in October 2020, he was noted to have a drop in hemoglobin to 8.3, down from 12.5.  Iron panel with ferritin 182, iron 30, saturation 7%.  No overt GI bleeding.  He received Feraheme inpatient and continued on oral iron thereafter.  Outpatient TCS/EGD completed 01/09/2020 revealing 4 tubular adenomas with recommendations to repeat colonoscopy in 3 years, EGD entirely normal.  Most recent labs with hemoglobin 11.1, iron 63, saturation ratio 12%, ferritin 134.  He denies BRBPR, melena, or any other significant upper or lower GI symptoms.  No NSAIDs.  Maintains on Protonix 40 mg daily.  Suspect anemia likely secondary to chronic disease, specifically suspect his chronic respiratory failure is likely playing a big role here.  For now, I recommend he continue oral iron and we will continue to monitor his hemoglobin.  If hemoglobin remains stable, I do not feel any other significant evaluation is warranted.  However, if hemoglobin declines, could consider reevaluating his small bowel with Givens capsule. He has labs later today with Dr. Delton Coombes. We will plan to see him back in 4 months.

## 2020-02-21 NOTE — Patient Instructions (Addendum)
Continue taking your iron tablets.  Continue taking Protonix 40 mg daily 30 minutes before breakfast.  Continue taking Linzess 145 mcg daily.  I will have our nurses check in to patient assistance to help with cost.  I recommend you follow a low-sodium diet to help with fluid accumulation.  You should not consume more than 2000 mg daily.   We will see back in 4 months.  Call with questions or concerns prior.  Aliene Altes, PA-C [facility]

## 2020-02-21 NOTE — Assessment & Plan Note (Signed)
Chronic.  Well-controlled on Linzess 145 mcg daily.  No alarm symptoms.  He does note this is quite expensive but desires to continue on this as long as he can as it works great.  I recommend that he continue on Linzess for now, and I will have our staff look into any available patient assistance for this medication. Follow-up in 4 months.

## 2020-02-24 NOTE — Progress Notes (Signed)
Cc'ed to pcp °

## 2020-02-26 ENCOUNTER — Inpatient Hospital Stay (HOSPITAL_COMMUNITY): Payer: Medicare HMO | Admitting: Hematology

## 2020-02-26 ENCOUNTER — Other Ambulatory Visit: Payer: Self-pay

## 2020-02-26 VITALS — BP 104/90 | HR 64 | Temp 96.8°F | Resp 19 | Wt 215.2 lb

## 2020-02-26 DIAGNOSIS — D500 Iron deficiency anemia secondary to blood loss (chronic): Secondary | ICD-10-CM | POA: Diagnosis not present

## 2020-02-26 DIAGNOSIS — D509 Iron deficiency anemia, unspecified: Secondary | ICD-10-CM | POA: Diagnosis not present

## 2020-02-26 NOTE — Progress Notes (Signed)
[address], [place] [address]   CLINIC:  Medical Oncology/Hematology  PCP:  Brett Gravel, MD [address] / [place] [address]  [phone]  REASON FOR VISIT:  Follow-up for iron deficiency anemia  CURRENT THERAPY: ferrous sulfate  INTERVAL HISTORY:  [name], a 69 y.o. male, returns for routine follow-up for his IDA. Brett [name] was last seen on 11/20/2019.  Today he is accompanied by his nephew. He is on nasal cannula. He reports being easily short of breath, especially in high humidity, even though reports taking the iron tablets. He denies having any melena or hematochezia. He eats a lot of fruits and vegetables.   REVIEW OF SYSTEMS:  Review of Systems  Constitutional: Positive for fatigue (mild). Negative for appetite change.  Respiratory: Positive for shortness of breath.   Neurological: Positive for numbness (hands).  Psychiatric/Behavioral: Positive for depression and sleep disturbance.  All other systems reviewed and are negative.   PAST MEDICAL/SURGICAL HISTORY:  Past Medical History:  Diagnosis Date  . Chronic diastolic heart failure ([place])   . COPD (chronic obstructive pulmonary disease) ([place])   . Depression   . Dextrocardia   . Diabetes mellitus   . GERD (gastroesophageal reflux disease)   . H. pylori infection 11/09/2012   treated with pylera  . HLD (hyperlipidemia)   . HTN (hypertension)    Cholesterol  . Iron deficiency anemia, unspecified 10/18/2012  . Neuropathy   . Pneumonia   . Situs inversus totalis   . Tachycardia    Past Surgical History:  Procedure Laterality Date  . BIOPSY  01/09/2020   Procedure: BIOPSY;  Surgeon: Daneil Dolin, MD;  [facility];  Service: Endoscopy;;  . CARPAL TUNNEL RELEASE Right 02/12/2020   Procedure: RIGHT CARPAL TUNNEL RELEASE;  Surgeon: Leandrew Koyanagi, MD;  Location: [facility];  Service: Orthopedics;  Laterality: Right;  . CATARACT  EXTRACTION, BILATERAL  2016  . COLONOSCOPY WITH ESOPHAGOGASTRODUODENOSCOPY (EGD) N/A 11/09/2012   Dr. 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 hpylori on bx- treated with pylera. TCS- normal rectum. 1 diminutive polyp in the mid descending segment. 1-65mm polyp in the mid desending segment o/w the remainder of the colonic mucosa appeared normal. tubular adenoma on bx  . COLONOSCOPY WITH PROPOFOL N/A 01/09/2020   Procedure: COLONOSCOPY WITH PROPOFOL;  Surgeon: Daneil Dolin, MD;  Four 3-5 mm polyps in the hepatic flexure and cecum resected and retrieved, otherwise normal exam.  Pathology with tubular adenomas.  Recommended repeat colonoscopy in 3 years.  . ESOPHAGOGASTRODUODENOSCOPY (EGD) WITH PROPOFOL N/A 01/09/2020   Procedure: ESOPHAGOGASTRODUODENOSCOPY (EGD) WITH PROPOFOL;  Surgeon: Daneil Dolin, MD; normal exam.  Negative for H. pylori   . GIVENS CAPSULE STUDY N/A 10/07/2013   no source for anemia or heme positive stool noted  . NECK SURGERY     bone spurs  . POLYPECTOMY  01/09/2020   Procedure: POLYPECTOMY;  Surgeon: Daneil Dolin, MD;  [facility];  Service: Endoscopy;;    SOCIAL HISTORY:  Social History   Socioeconomic History  . Marital status: Married    Spouse name: Not on file  . Number of children: 0  . Years of education: 7th grade   . Highest education level: Not on file  Occupational History  . Occupation: Biomedical scientist   Tobacco Use  . Smoking status: Never Smoker  . Smokeless tobacco: Current  User    Types: Chew  Vaping Use  . Vaping Use: Never used  Substance and Sexual Activity  . Alcohol use: No  . Drug use: No  . Sexual activity: Yes  Other Topics Concern  . Not on file  Social History Narrative  . Not on file   Social Determinants of Health   Financial Resource Strain:   . Difficulty of Paying Living Expenses:   Food Insecurity:   . Worried About  Charity fundraiser in the Last Year:   . Arboriculturist in the Last Year:   Transportation Needs:   . Film/video editor (Medical):   Marland Kitchen Lack of Transportation (Non-Medical):   Physical Activity:   . Days of Exercise per Week:   . Minutes of Exercise per Session:   Stress:   . Feeling of Stress :   Social Connections:   . Frequency of Communication with Friends and Family:   . Frequency of Social Gatherings with Friends and Family:   . Attends Religious Services:   . Active Member of Clubs or Organizations:   . Attends Archivist Meetings:   Marland Kitchen Marital Status:   Intimate Partner Violence:   . Fear of Current or Ex-Partner:   . Emotionally Abused:   Marland Kitchen Physically Abused:   . Sexually Abused:     FAMILY HISTORY:  Family History  Problem Relation Age of Onset  . Heart attack Mother   . Diabetes Sister        Fx  . Heart defect Sister        Fx  . Arthritis Sister        Fx  . Asthma Sister        Fx  . Kidney disease Sister        Fx  . Pancreatitis Sister   . Colon cancer Neg Hx     CURRENT MEDICATIONS:  Current Outpatient Medications  Medication Sig Dispense Refill  . aspirin EC 81 MG tablet Take 81 mg by mouth daily.    . budesonide-formoterol (SYMBICORT) 160-4.5 MCG/ACT inhaler Inhale 2 puffs into the lungs 2 (two) times daily. 1 Inhaler 5  . calcium-vitamin D (OSCAL WITH D) 500-200 MG-UNIT tablet Take 1 tablet by mouth.    . cetirizine (ZYRTEC) 10 MG tablet Take 10 mg by mouth daily.    . Cinnamon 500 MG capsule Take 2,000 mg by mouth daily.     Marland Kitchen co-enzyme Q-10 30 MG capsule Take 30 mg by mouth 3 (three) times daily.    . DULoxetine (CYMBALTA) 60 MG capsule Take 60 mg by mouth daily.    . fenofibrate (TRICOR) 145 MG tablet Take 145 mg by mouth every evening.     . ferrous sulfate 325 (65 FE) MG tablet Take 325-650 mg by mouth See admin instructions. Take two tablets on Monday Wednesday, Friday. And 1 tablet on all other days    . folic acid  (FOLVITE) 1 MG tablet Take 1 mg by mouth daily.    . furosemide (LASIX) 40 MG tablet TAKE 1 AND 1/2 TABLET IN THE MORNING AND 1 TABLET IN THE EVENING. (Patient taking differently: Take 40-60 mg by mouth See admin instructions. 60 mg in the  Morning,40 mg in the evening) 225 tablet 1  . gabapentin (NEURONTIN) 600 MG tablet Take 1,200 mg by mouth 3 (three) times daily.    Marland Kitchen guaiFENesin (MUCINEX) 600 MG 12 hr tablet Take 600 mg by mouth 1 day  or 1 dose.    Marland Kitchen HYDROcodone-acetaminophen (NORCO) 5-325 MG tablet Take 1-2 tablets by mouth 3 (three) times daily as needed. 30 tablet 0  . insulin detemir (LEVEMIR) 100 UNIT/ML injection Inject 60 Units into the skin at bedtime.     Marland Kitchen LINZESS 145 MCG CAPS capsule TAKE ONE CAPSULE BY MOUTH ONCE DAILY BEFORE BREAKFAST. (Patient taking differently: Take 145 mcg by mouth daily before breakfast. ) 90 capsule 3  . losartan (COZAAR) 100 MG tablet Take 100 mg by mouth daily.    . metFORMIN (GLUCOPHAGE) 1000 MG tablet Take 1,000 mg by mouth 2 (two) times daily with a meal.    . metoprolol tartrate (LOPRESSOR) 25 MG tablet Take 1 tablet (25 mg total) by mouth 2 (two) times daily. 180 tablet 3  . Multiple Vitamin (MULTIVITAMIN) tablet Take 1 tablet by mouth daily.    . pantoprazole (PROTONIX) 40 MG tablet Take 1 tablet (40 mg total) by mouth daily. 30 tablet 11  . pioglitazone (ACTOS) 45 MG tablet Take 45 mg by mouth daily.    Marland Kitchen rOPINIRole (REQUIP) 2 MG tablet Take 2 mg by mouth at bedtime.     . rosuvastatin (CRESTOR) 40 MG tablet Take 40 mg by mouth every evening.     . sitaGLIPtin (JANUVIA) 50 MG tablet Take 50 mg by mouth daily.     . traMADol (ULTRAM) 50 MG tablet Take 50-100 mg by mouth every 6 (six) hours as needed for moderate pain.     . traZODone (DESYREL) 50 MG tablet Take 150 mg by mouth at bedtime.     Marland Kitchen albuterol (PROAIR HFA) 108 (90 Base) MCG/ACT inhaler Inhale 2 puffs into the lungs every 4 (four) hours as needed for wheezing or shortness of breath.  (Patient not taking: Reported on 02/26/2020) 1 Inhaler 2  . ondansetron (ZOFRAN) 4 MG tablet Take 1-2 tablets (4-8 mg total) by mouth every 8 (eight) hours as needed for nausea or vomiting. (Patient not taking: Reported on 02/26/2020) 20 tablet 0   No current facility-administered medications for this visit.    ALLERGIES:  No Known Allergies  PHYSICAL EXAM:  Performance status (ECOG): 1 - Symptomatic but completely ambulatory  Vitals:   02/26/20 1131  BP: 104/90  Pulse: 64  Resp: 19  Temp: (!) 96.8 F (36 C)  SpO2: 90%   Wt Readings from Last 3 Encounters:  02/26/20 215 lb 3.2 oz (97.6 kg)  02/21/20 225 lb 9.6 oz (102.3 kg)  02/12/20 209 lb 7 oz (95 kg)   Physical Exam Vitals reviewed.  Constitutional:      Appearance: Normal appearance. He is obese.  Cardiovascular:     Rate and Rhythm: Normal rate and regular rhythm.     Pulses: Normal pulses.     Heart sounds: Normal heart sounds.  Pulmonary:     Effort: Pulmonary effort is normal.     Breath sounds: Normal breath sounds.     Comments: Nasal cannula Neurological:     General: No focal deficit present.     Mental Status: He is alert and oriented to person, place, and time.  Psychiatric:        Mood and Affect: Mood normal.        Behavior: Behavior normal.     LABORATORY DATA:  I have reviewed the labs as listed.  CBC Latest Ref Rng & Units 02/21/2020 01/07/2020 01/03/2020  WBC 4.0 - 10.5 K/uL 9.3 8.2 9.7  Hemoglobin 13.0 - 17.0 g/dL 9.4(L) 11.1(L)  12.2(L)  Hematocrit 39 - 52 % 30.6(L) 35.3(L) 37.6  Platelets 150 - 400 K/uL 355 377 434   CMP Latest Ref Rng & Units 02/07/2020 01/07/2020 11/12/2019  Glucose 70 - 99 mg/dL 96 93 179(H)  BUN 8 - 23 mg/dL 22 15 18   Creatinine 0.61 - 1.24 mg/dL 1.10 1.02 1.16  Sodium 135 - 145 mmol/L 134(L) 134(L) 128(L)  Potassium 3.5 - 5.1 mmol/L 4.7 4.0 4.1  Chloride 98 - 111 mmol/L 98 98 90(L)  CO2 22 - 32 mmol/L 25 25 28   Calcium 8.9 - 10.3 mg/dL 9.7 9.5 9.6  Total Protein 6.5 -  8.1 g/dL - - 7.4  Total Bilirubin 0.3 - 1.2 mg/dL - - 0.4  Alkaline Phos 38 - 126 U/L - - 39  AST 15 - 41 U/L - - 22  ALT 0 - 44 U/L - - 14      Component Value Date/Time   RBC 3.24 (L) 02/21/2020 1055   MCV 94.4 02/21/2020 1055   MCV 91 01/03/2020 0948   MCH 29.0 02/21/2020 1055   MCHC 30.7 02/21/2020 1055   RDW 14.1 02/21/2020 1055   RDW 12.9 01/03/2020 0948   LYMPHSABS 0.9 02/21/2020 1055   LYMPHSABS 1.9 01/03/2020 0948   MONOABS 0.8 02/21/2020 1055   EOSABS 0.1 02/21/2020 1055   EOSABS 0.1 01/03/2020 0948   BASOSABS 0.0 02/21/2020 1055   BASOSABS 0.0 01/03/2020 0948    DIAGNOSTIC IMAGING:  I have independently reviewed the scans and discussed with the patient. No results found.   ASSESSMENT:  1.  Iron deficiency anemia: -Hospitalized in October 2020 when his hemoglobin dropped and had received Feraheme on 07/09/2019. -Colonoscopy on 01/09/2020 shows for subcentimeter polyps in the hepatic flexure, cecum.  Rest of the exam was normal. -EGD on 01/09/2020 shows normal esophagus, stomach and duodenum.   PLAN:  1.  Iron deficiency anemia: -I reviewed his labs from 02/21/2020.  Hemoglobin dropped to 9.4 from 11.1 previously. -Ferritin dropped to 98 from 134. -He reports severe tiredness.  I have recommended Feraheme weekly x2.  We plan to see him back in 6 to 8 weeks with repeat labs.  2.  Leukocytosis and thrombocytosis: -Work-up for myeloproliferative disorders previously done was negative.  3.  Hyponatremia: -This has improved to 134 on previous labs.   Orders placed this encounter:  No orders of the defined types were placed in this encounter.    Derek Jack, MD [place] [phone]   I, Milinda Antis, am acting as a scribe for Dr. Sanda Linger.  I, Derek Jack MD, have reviewed the above documentation for accuracy and completeness, and I agree with the above.

## 2020-02-26 NOTE — Patient Instructions (Signed)
[place] at [facility] Discharge Instructions  You were seen today by Dr. Delton Coombes. He went over your recent results. You will get 2 iron infusions 1 week apart. Dr. Delton Coombes will see you back in 2 months for labs and follow up.   Thank you for choosing [place] at [facility] to provide your oncology and hematology care.  To afford each patient quality time with our provider, please arrive at least 15 minutes before your scheduled appointment time.   If you have a lab appointment with the [facility] please come in thru the Main Entrance and check in at the main information desk  You need to re-schedule your appointment should you arrive 10 or more minutes late.  We strive to give you quality time with our providers, and arriving late affects you and other patients whose appointments are after yours.  Also, if you no show three or more times for appointments you may be dismissed from the clinic at the providers discretion.     Again, thank you for choosing [facility].  Our hope is that these requests will decrease the amount of time that you wait before being seen by our physicians.       _____________________________________________________________  Should you have questions after your visit to [facility], please contact our office at [phone] between the hours of 8:00 a.m. and 4:30 p.m.  Voicemails left after 4:00 p.m. will not be returned until the following business day.  For prescription refill requests, have your pharmacy contact our office and allow 72 hours.    Cancer Center Support Programs:   > Cancer Support Group  2nd Tuesday of the month 1pm-2pm, Journey Room

## 2020-02-27 ENCOUNTER — Ambulatory Visit (INDEPENDENT_AMBULATORY_CARE_PROVIDER_SITE_OTHER): Payer: Medicare HMO | Admitting: Surgery

## 2020-02-27 DIAGNOSIS — G5601 Carpal tunnel syndrome, right upper limb: Secondary | ICD-10-CM

## 2020-02-27 MED ORDER — CEPHALEXIN 500 MG PO CAPS: 500.0000 mg | ORAL_CAPSULE | Freq: Two times a day (BID) | ORAL | 0 refills | Status: DC

## 2020-02-27 NOTE — Progress Notes (Signed)
69 year old white male who is 2 weeks status post right carpal tunnel release returns for wound inspection and possible suture removal.  Surgery performed by Dr. Erlinda Hong.  Patient states that he has been putting an antibiotic ointment on his incision he has also been washing dishes without covering his hand and also getting it wet in the shower.  He has had some separation of the wound edges throughout the incision.  States that he has had some burning sensation at the surgical site.  No complaints of drainage.   Exam Very pleasant white male alert and oriented in no acute distress.  He does have some separation of the wound edges of about 2 to 3 mm and this is about 2 mm deep.  No gross drainage or signs of infection.  The sutures were removed as I did not feel that this was really helping to keep the wound closed at this point.  Steri-Strips were applied and assistant tried to bring the edges as close together she could.   Plan We will start prophylactic Keflex 500 mg p.o. twice daily.  Dressing was applied with gauze and Ace bandage.  Patient will keep this on and we will not get his wound wet.  Follow-up with Dr. Erlinda Hong next Tuesday.  Also asked patient to continue wearing his wrist splint.  He voices understanding.  All questions answered.

## 2020-03-02 ENCOUNTER — Inpatient Hospital Stay (HOSPITAL_COMMUNITY): Payer: Medicare HMO

## 2020-03-02 ENCOUNTER — Other Ambulatory Visit: Payer: Self-pay

## 2020-03-02 ENCOUNTER — Encounter (HOSPITAL_COMMUNITY): Payer: Self-pay

## 2020-03-02 VITALS — BP 114/46 | HR 60 | Temp 97.3°F | Resp 20

## 2020-03-02 DIAGNOSIS — D509 Iron deficiency anemia, unspecified: Secondary | ICD-10-CM | POA: Diagnosis not present

## 2020-03-02 DIAGNOSIS — D500 Iron deficiency anemia secondary to blood loss (chronic): Secondary | ICD-10-CM

## 2020-03-02 MED ORDER — SODIUM CHLORIDE 0.9 % IV SOLN: Freq: Once | INTRAVENOUS | Status: AC

## 2020-03-02 MED ORDER — SODIUM CHLORIDE 0.9 % IV SOLN
510.0000 mg | Freq: Once | INTRAVENOUS | Status: AC
  Administered 2020-03-02: 510 mg via INTRAVENOUS
  Filled 2020-03-02: qty 510

## 2020-03-02 NOTE — Progress Notes (Signed)
Tolerated infusion w/o adverse reaction.  Alert, in no distress.  VSS.  Discharged via wheelchair in c/o family.  

## 2020-03-04 ENCOUNTER — Ambulatory Visit (INDEPENDENT_AMBULATORY_CARE_PROVIDER_SITE_OTHER): Payer: Medicare HMO | Admitting: Orthopaedic Surgery

## 2020-03-04 ENCOUNTER — Encounter: Payer: Self-pay | Admitting: Orthopaedic Surgery

## 2020-03-04 DIAGNOSIS — G5601 Carpal tunnel syndrome, right upper limb: Secondary | ICD-10-CM

## 2020-03-04 NOTE — Progress Notes (Signed)
Patient ID: [name], male   DOB: 1950-11-25, 69 y.o.   MRN: [id number]  Brett [name] returns today for a wound check of his right carpal tunnel incision.  This was last week when he was evaluated by Benjiman Core.  He has finished an entire week of Keflex.    On examination the wound is stable appearing.  No signs of infection or drainage.  He has some dead skin superficially.  I remove the dead skin and placed Bactroban ointment with a Band-Aid.  He will do this twice a day.  He can discontinue the Keflex at this point.  He is to avoid getting the incision wet.  Recheck in 4 weeks.  Reassurance was provided that this should heal fine with by secondary intention.

## 2020-03-09 ENCOUNTER — Other Ambulatory Visit: Payer: Self-pay

## 2020-03-09 ENCOUNTER — Ambulatory Visit: Payer: Medicare HMO | Admitting: Internal Medicine

## 2020-03-09 ENCOUNTER — Encounter: Payer: Self-pay | Admitting: Internal Medicine

## 2020-03-09 ENCOUNTER — Encounter (HOSPITAL_COMMUNITY): Payer: Self-pay

## 2020-03-09 ENCOUNTER — Inpatient Hospital Stay (HOSPITAL_COMMUNITY): Payer: Medicare HMO

## 2020-03-09 VITALS — BP 110/41 | HR 60 | Temp 97.3°F | Resp 18

## 2020-03-09 DIAGNOSIS — J4540 Moderate persistent asthma, uncomplicated: Secondary | ICD-10-CM | POA: Diagnosis not present

## 2020-03-09 DIAGNOSIS — D509 Iron deficiency anemia, unspecified: Secondary | ICD-10-CM | POA: Diagnosis not present

## 2020-03-09 DIAGNOSIS — J9611 Chronic respiratory failure with hypoxia: Secondary | ICD-10-CM

## 2020-03-09 DIAGNOSIS — D500 Iron deficiency anemia secondary to blood loss (chronic): Secondary | ICD-10-CM

## 2020-03-09 DIAGNOSIS — Q893 Situs inversus: Secondary | ICD-10-CM

## 2020-03-09 MED ORDER — SODIUM CHLORIDE 0.9 % IV SOLN: Freq: Once | INTRAVENOUS | Status: AC

## 2020-03-09 MED ORDER — SODIUM CHLORIDE 0.9 % IV SOLN
510.0000 mg | Freq: Once | INTRAVENOUS | Status: AC
  Administered 2020-03-09: 510 mg via INTRAVENOUS
  Filled 2020-03-09: qty 510

## 2020-03-09 NOTE — Patient Instructions (Addendum)
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   Work on inhaler technique:  relax and gently blow all the way out then take a nice smooth deep breath back in, triggering the inhaler at same time you start breathing in.  Hold for up to 5 seconds if you can. Blow out thru nose. Rinse and gargle with water when done  Only use your albuterol as a rescue medication to be used if you can't catch your breath by resting or doing a relaxed purse lip breathing pattern.  - The less you use it, the better it will work when you need it. - Ok to use up to 2 puffs  every 4 hours if you must but call for immediate appointment if use goes up over your usual need - Don't leave home without it !!  (think of it like the spare tire for your car)    For cough/ congestion > mucinex /mucinex dm  Up to 1200 mg every 12 hours and use the flutter valve as much as you can  Make sure you check your oxygen saturations at highest level of activity to be sure it stays over 90% and adjust upward to maintain this level if needed but remember to turn it back to previous settings when you stop (to conserve your supply).      Please schedule a follow up visit in 3 months but call sooner if needed  with all medications /inhalers/ solutions in hand so we can verify exactly what you are taking. This includes all medications from all doctors and over the counters Add cxr on return

## 2020-03-09 NOTE — Patient Instructions (Signed)
[facility] at [facility]  Discharge Instructions:   _______________________________________________________________  Thank you for choosing [facility] at [facility] to provide your oncology and hematology care.  To afford each patient quality time with our providers, please arrive at least 15 minutes before your scheduled appointment.  You need to re-schedule your appointment if you arrive 10 or more minutes late.  We strive to give you quality time with our providers, and arriving late affects you and other patients whose appointments are after yours.  Also, if you no show three or more times for appointments you may be dismissed from the clinic.  Again, thank you for choosing [facility] at [facility]. Our hope is that these requests will allow you access to exceptional care and in a timely manner. _______________________________________________________________  If you have questions after your visit, please contact our office at [phone] between the hours of 8:30 a.m. and 5:00 p.m. Voicemails left after 4:30 p.m. will not be returned until the following business day. _______________________________________________________________  For prescription refill requests, have your pharmacy contact our office. _______________________________________________________________  Recommendations made by the consultant and any test results will be sent to your referring physician. _______________________________________________________________ 

## 2020-03-09 NOTE — Progress Notes (Signed)
Brett [name], male    DOB: [birth year],     MRN: [id number]   Brief patient profile:  74 yowm never smoker with situs inversus "born with asthma" and aware of nasal drainage with breathing problems s/p admit 07/08/2019   Admit date: 07/08/2019 Discharge date: 07/11/2019   Discharge Diagnoses:  Active Problems:   COPD exacerbation ([place])   Acute on chronic respiratory failure (HCC)   Acute hypoxemic respiratory failure (HCC)   Anemia   Class 1 obesity due to excess calories with body mass index (BMI) of 32.0 to 32.9 in adult      History of present illness:  Brett [name] a 68 y.o.malewith medical history significant forCOPDwithchronic respiratory failure on 3 L O2, Kartagener's syndrome, diastolic CHF, depression. Patient presented to the ED with complaints of 1 week of worsening cough, progressive difficulty breathing. Reports that his O2 sats have been dropping so he has increased his oxygen from 3 L to 4 L. He reports difficulty expectorating sputum, but has hadworsening cough productive of cream-colored sputum, unrelievedeven withdouble doseMucinex. He denies fever or chills, no body aches, no sore throat, no contacts withknownCovid positive patients.  Recent hospitalization9/6 - 9/8acute on chronic respiratory failure, secondary to COPD and decompensated CHF.CTA negative for PE.Patient required IV diuresis with Lasix, steroids, doxycycline.  Hospital Course:  Acute on chronic hypoxemic respiratory failure in the setting of COPD exacerbation -Patient has history of Kartagener syndrome and situs inversus -Wears 3 L nasal cannula oxygen at home. -Follows with Dr. Luan Pulling in the outpatient setting,willconsider consultation as needed -Continueoral antibiotics to complete course -Resume home inhaler/nebulizer regimen; patient will complete steroids tapering. -Speaking in full sentences, with good oxygen saturation on chronic supplementation. -For  upper airway congestion will use loratadine and Flonase. -Covid testing negative  Class I obesity -Body mass index is 32.92 kg/m. -Calorie diet, portion control and increase physical activity discussed with patient.  Acute anemia -stable hemoglobin at discharge. -Iron studies with significant deficiency; status post IVFeraheme -No bright red blood per rectum or melanotic stools appreciated. -Patient discharge will need Ferrex daily -Outpatient follow-up with gastroenterology service for further endoscopy/colonoscopy evaluation. -Hemoglobin on repeat CBChas remainedstable -Continue PPI.  Diastolic CHF -Last echocardiogram 03/2018 EF 60-65% with indeterminate diastolic function -Continue home Lasix as scheduled -Chronic and compensated -Continue to check daily weights and follow low-sodium diet.  Hypertension -stable -Continue home losartan and metoprolol -Advised to follow heart healthy diet  Depression -Continue Cymbalta -No suicidal ideation or hallucinations.  Diabetes mellitus with neuropathy -Resume home hypoglycemic regimen and closely follow CBGs and A1c as an outpatient. -Resume gabapentin for diabetic neuropathy -SPECT increased CBGs in the setting of his steroids usage.          History of Present Illness  03/09/2020  Pulmonary/ 1st office eval/[name]   J& J   02/23/2020  Chief Complaint  Patient presents with   Pulmonary Consult    Former pt of Dr Luan Pulling. Pt states had pleural effusion March 2020. He has been on o2 24/7 x 2 years- states he has "sinus issues".    Dyspnea:  Mostly house bound but constantly moving around the house on 02  Cough: rattle /min slt yellow/ better now / has flutter valve/ mucinex helps Sleep: cpap per neurology  SABA use: not using  02  2lpm 24/7 does not titrate   No obvious day to day or daytime variability or assoc excess/ purulent sputum or mucus plugs or hemoptysis or cp or chest tightness, subjective  wheeze or  overt sinus or hb symptoms.   Sleeping on cpap  without nocturnal  or early am exacerbation  of respiratory  c/o's or need for noct saba. Also denies any obvious fluctuation of symptoms with weather or environmental changes or other aggravating or alleviating factors except as outlined above   No unusual exposure hx or h/o childhood pna  or knowledge of premature birth.  Current Allergies, Complete Past Medical History, Past Surgical History, Family History, and Social History were reviewed in [employer] record.  ROS  The following are not active complaints unless bolded Hoarseness, sore throat, dysphagia, dental problems, itching, sneezing,  nasal congestion or discharge of excess mucus or purulent secretions, ear ache,   fever, chills, sweats, unintended wt loss or wt gain, classically pleuritic or exertional cp,  orthopnea pnd or arm/hand swelling  or leg swelling, presyncope, palpitations, abdominal pain, anorexia, nausea, vomiting, diarrhea  or change in bowel habits or change in bladder habits, change in stools or change in urine, dysuria, hematuria,  rash, arthralgias, visual complaints, headache, numbness, weakness or ataxia or problems with walking or coordination,  change in mood or  memory.           Past Medical History:  Diagnosis Date   Chronic diastolic heart failure (HCC)    COPD (chronic obstructive pulmonary disease) (HCC)    Depression    Dextrocardia    Diabetes mellitus    GERD (gastroesophageal reflux disease)    H. pylori infection 11/09/2012   treated with pylera   HLD (hyperlipidemia)    HTN (hypertension)    Cholesterol   Iron deficiency anemia, unspecified 10/18/2012   Neuropathy    Pneumonia    Situs inversus totalis    Tachycardia     Outpatient Medications Prior to Visit  Medication Sig Dispense Refill   albuterol (PROAIR HFA) 108 (90 Base) MCG/ACT inhaler Inhale 2 puffs into the lungs every 4 (four) hours as needed  for wheezing or shortness of breath. 1 Inhaler 2   aspirin EC 81 MG tablet Take 81 mg by mouth daily.     budesonide-formoterol (SYMBICORT) 160-4.5 MCG/ACT inhaler Inhale 2 puffs into the lungs 2 (two) times daily. 1 Inhaler 5   calcium-vitamin D (OSCAL WITH D) 500-200 MG-UNIT tablet Take 1 tablet by mouth.     cetirizine (ZYRTEC) 10 MG tablet Take 10 mg by mouth daily.     Cinnamon 500 MG capsule Take 2,000 mg by mouth daily.      co-enzyme Q-10 30 MG capsule Take 30 mg by mouth 3 (three) times daily.     DULoxetine (CYMBALTA) 60 MG capsule Take 60 mg by mouth daily.     fenofibrate (TRICOR) 145 MG tablet Take 145 mg by mouth every evening.      ferrous sulfate 325 (65 FE) MG tablet Take 325-650 mg by mouth See admin instructions. Take two tablets on Monday Wednesday, Friday. And 1 tablet on all other days     folic acid (FOLVITE) 1 MG tablet Take 1 mg by mouth daily.     furosemide (LASIX) 40 MG tablet TAKE 1 AND 1/2 TABLET IN THE MORNING AND 1 TABLET IN THE EVENING. (Patient taking differently: Take 40-60 mg by mouth See admin instructions. 60 mg in the  Morning,40 mg in the evening) 225 tablet 1   gabapentin (NEURONTIN) 600 MG tablet Take 1,200 mg by mouth 3 (three) times daily.     guaiFENesin (MUCINEX) 600 MG 12 hr  tablet Take 600 mg by mouth 1 day or 1 dose.     HYDROcodone-acetaminophen (NORCO) 5-325 MG tablet Take 1-2 tablets by mouth 3 (three) times daily as needed. 30 tablet 0   insulin detemir (LEVEMIR) 100 UNIT/ML injection Inject 60 Units into the skin at bedtime.      LINZESS 145 MCG CAPS capsule TAKE ONE CAPSULE BY MOUTH ONCE DAILY BEFORE BREAKFAST. (Patient taking differently: Take 145 mcg by mouth daily before breakfast. ) 90 capsule 3   losartan (COZAAR) 100 MG tablet Take 100 mg by mouth daily.     metFORMIN (GLUCOPHAGE) 1000 MG tablet Take 1,000 mg by mouth 2 (two) times daily with a meal.     metoprolol tartrate (LOPRESSOR) 25 MG tablet Take 1 tablet (25 mg  total) by mouth 2 (two) times daily. 180 tablet 3   Multiple Vitamin (MULTIVITAMIN) tablet Take 1 tablet by mouth daily.     ondansetron (ZOFRAN) 4 MG tablet Take 1-2 tablets (4-8 mg total) by mouth every 8 (eight) hours as needed for nausea or vomiting. 20 tablet 0   pantoprazole (PROTONIX) 40 MG tablet Take 1 tablet (40 mg total) by mouth daily. 30 tablet 11   pioglitazone (ACTOS) 45 MG tablet Take 45 mg by mouth daily.     rOPINIRole (REQUIP) 2 MG tablet Take 2 mg by mouth at bedtime.      rosuvastatin (CRESTOR) 40 MG tablet Take 40 mg by mouth every evening.      sitaGLIPtin (JANUVIA) 50 MG tablet Take 50 mg by mouth daily.      traMADol (ULTRAM) 50 MG tablet Take 50-100 mg by mouth every 6 (six) hours as needed for moderate pain.      traZODone (DESYREL) 50 MG tablet Take 150 mg by mouth at bedtime.         Objective:     BP (!) 130/58 (Cuff Size: Normal)    Pulse 62    Temp (!) 96.4 F (35.8 C) (Oral)    Ht 5' 7.5" (1.715 m)    Wt 208 lb (94.3 kg)    SpO2 100% Comment: on 2LPM   BMI 32.10 kg/m   SpO2: 100 % (on 2LPM) O2 Type: Continuous O2 O2 Flow Rate (L/min): 2 L/min  amb wm vocal tremor/ slt hoarse  Clear lungs   CXR PA and Lateral:   03/09/2020 :    I personally reviewed images and agree with radiology impression as follows:   Did not go as rec    I personally reviewed images and agree with radiology impression as follows:   Chest CTa  05/19/2019  No evidence of pulmonary embolism.  Situs inversus totalis.  Bronchitic changes with chronic atelectasis of RIGHT lower lobe.  Interseptal thickening and diffuse mosaic attenuation throughout both lungs, could represent edema or less likely infection.  Single minimally enlarged para-aortic node 12 mm short axis, nonspecific.      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   Re  Saba: I spent extra time with pt today reviewing appropriate use of albuterol for prn use on exertion with the following points: 1) saba is for relief of sob that  does not improve by walking a slower pace or resting but rather if the pt does not improve after trying this first. 2) If the pt is convinced, as many are, that saba helps recover from activity faster then it's easy to tell if this is the case by re-challenging : ie stop, take the inhaler, then p 5 minutes try the exact same activity (intensity of workload) that just caused the symptoms and see if they are substantially diminished or not after saba 3) if there is an activity that reproducibly causes the symptoms, try the saba 15 min before the activity on alternate days   If in fact the saba really does help, then fine to continue to use it prn but advised may need to look closer at the maintenance regimen being used to achieve better control of airways disease with exertion.   >>> f/u with cxr in 3 m, call sooner if needed     Moderate persistent asthma, uncomplicated - The proper method of use, as well as anticipated side effects, of a metered-dose inhaler were discussed and demonstrated to the patient.   Takes symbicort prn which is ok Based on two studies from [place]; 20 p 1865 (2018) and 380 : p2020-30 (2019) in pts with mild asthma it is reasonable to use low dose symbicort eg 80 2bid "prn" flare in this setting but I emphasized this was only shown with symbicort and takes advantage of the rapid onset of action but is not the same as "rescue therapy" but can be stopped once the acute symptoms have resolved and the need for rescue has been minimized (< 2 x weekly)      Chronic respiratory failure with hypoxia ([place]) Adequate control  on present rx, reviewed in detail with pt > no change in rx needed    Advised:  Make sure you check your oxygen saturations at highest level of activity to be sure it stays over 90% and adjust upward to maintain this level if needed but remember to turn it back to previous settings when you stop (to conserve your supply).    Each maintenance medication was reviewed in detail including emphasizing most importantly the difference between maintenance and prns and under what circumstances the prns are to be triggered using an action plan format where appropriate.  Total time for H and P, chart review, counseling, teaching device and generating customized AVS unique to this office visit / charting = 30 min         Christinia Gully, MD 03/09/2020

## 2020-03-09 NOTE — Progress Notes (Signed)
Tolerated iron infusion well.  Discharged via wheelchair with family.

## 2020-03-10 ENCOUNTER — Encounter: Payer: Self-pay | Admitting: Internal Medicine

## 2020-03-10 NOTE — Assessment & Plan Note (Signed)
Spirometry 03/09/2020  FEV1 3.08 (54%)  Ratio 0.75  And ERV 17% with nl f/v curve  Classic mucociliary dysfunction related to Kartagener's syndrome and likely evolving bronchiectasis but no implications to change rx at this point as he clinically has very little in terms of AB and not yet in need of vest.   Re  Brett [name]: I spent extra time with pt today reviewing appropriate use of albuterol for prn use on exertion with the following points: 1) Brett [name] is for relief of sob that does not improve by walking a slower pace or resting but rather if the pt does not improve after trying this first. 2) If the pt is convinced, as many are, that Brett [name] helps recover from activity faster then it's easy to tell if this is the case by re-challenging : ie stop, take the inhaler, then p 5 minutes try the exact same activity (intensity of workload) that just caused the symptoms and see if they are substantially diminished or not after Brett [name] 3) if there is an activity that reproducibly causes the symptoms, try the Brett [name] 15 min before the activity on alternate days   If in fact the Brett [name] really does help, then fine to continue to use it prn but advised may need to look closer at the maintenance regimen being used to achieve better control of airways disease with exertion.   >>> f/u with cxr in 3 m, call sooner if needed

## 2020-03-10 NOTE — Assessment & Plan Note (Signed)
Adequate control on present rx, reviewed in detail with pt > no change in rx needed    Advised:  Make sure you check your oxygen saturations at highest level of activity to be sure it stays over 90% and adjust upward to maintain this level if needed but remember to turn it back to previous settings when you stop (to conserve your supply).           Each maintenance medication was reviewed in detail including emphasizing most importantly the difference between maintenance and prns and under what circumstances the prns are to be triggered using an action plan format where appropriate.  Total time for H and P, chart review, counseling, teaching device and generating customized AVS unique to this office visit / charting = 30 min

## 2020-03-10 NOTE — Assessment & Plan Note (Addendum)
-   The proper method of use, as well as anticipated side effects, of a metered-dose inhaler were discussed and demonstrated to the patient.   Takes symbicort prn which is ok Based on two studies from [place]; 20 p 1865 (2018) and 380 : p2020-30 (2019) in pts with mild asthma it is reasonable to use low dose symbicort eg 80 2bid "prn" flare in this setting but I emphasized this was only shown with symbicort and takes advantage of the rapid onset of action but is not the same as "rescue therapy" but can be stopped once the acute symptoms have resolved and the need for rescue has been minimized (< 2 x weekly)

## 2020-04-02 ENCOUNTER — Encounter: Payer: Self-pay | Admitting: Orthopaedic Surgery

## 2020-04-02 ENCOUNTER — Ambulatory Visit: Payer: Medicare HMO | Admitting: Orthopaedic Surgery

## 2020-04-02 DIAGNOSIS — G5601 Carpal tunnel syndrome, right upper limb: Secondary | ICD-10-CM

## 2020-04-02 DIAGNOSIS — G5602 Carpal tunnel syndrome, left upper limb: Secondary | ICD-10-CM

## 2020-04-02 NOTE — Progress Notes (Signed)
Patient ID: [name], male   DOB: [birth year], 69 y.o.   MRN: [id number]  Brett [name] is following up for his right carpal tunnel release 6 weeks ago.  He has done well.  Surgical scar is fully healed.  No complaints other than some soreness in the wrist.  He would like to have the left carpal tunnel release scheduled today.  He met with Judeen Hammans today to schedule his left carpal tunnel release.  We reviewed again the risk benefits rehab recovery.  Questions answered to satisfaction.

## 2020-04-03 ENCOUNTER — Other Ambulatory Visit: Payer: Self-pay

## 2020-04-14 ENCOUNTER — Encounter: Payer: Self-pay | Admitting: *Deleted

## 2020-04-14 ENCOUNTER — Encounter: Payer: Self-pay | Admitting: Cardiology

## 2020-04-14 ENCOUNTER — Ambulatory Visit: Payer: Medicare HMO | Admitting: Cardiology

## 2020-04-14 VITALS — BP 138/60 | HR 78 | Ht 67.0 in | Wt 215.0 lb

## 2020-04-14 DIAGNOSIS — I2510 Atherosclerotic heart disease of native coronary artery without angina pectoris: Secondary | ICD-10-CM

## 2020-04-14 DIAGNOSIS — I5032 Chronic diastolic (congestive) heart failure: Secondary | ICD-10-CM | POA: Diagnosis not present

## 2020-04-14 DIAGNOSIS — E782 Mixed hyperlipidemia: Secondary | ICD-10-CM

## 2020-04-14 DIAGNOSIS — I10 Essential (primary) hypertension: Secondary | ICD-10-CM

## 2020-04-14 NOTE — Progress Notes (Signed)
Patient scheduled for left CTR with Dr Erlinda Hong. He had previous right CTR done here at [facility] on 02-11-20. Has hx bronchiectaisis,asthma and COPD, Kartageners Syndrome and is on O2 at 3L/[place] 24/7. He is followed by pulmonary Dr Melvyn Novas. Chart reviewed by Dr Sabra Heck, [place] for [facility] since pt was done here in June for same surgery

## 2020-04-14 NOTE — Patient Instructions (Signed)

## 2020-04-14 NOTE — Progress Notes (Signed)
Clinical Summary Mr. Eves is a 69 y.o.male  seen today for follow up of the following medical problems.   1. Chronic diastolic HF - 02/2693 echo LVEF 60-65%, indeterminant diastolic function - admisssion 04/12/18 with SOB. Mutlfactorial including diastolic HF but also related to his chronic lung disease,. He was diuresed, also managed with bronchodilators and antibiotics for respiratory infection - readmitted later in 04/2018 with recurrent SOB. Managed again for COPD/bronchiectasis and diastolic HF  .   - occasional LE edema, rare. Compliant with diuretic.    2. Elevated troponin/CAD - mild trop elevation to 0.43 in setting of HF and hypoxia, did not have typical chest pain. Thought likely demand ischemia, plans were to consider possible outpatient stress - echo normal LVEF no WMAs.  05/2018 coronary CTA with mid vessel stenosis, significant by FFR 0.76  - no exertional chest pains.    3. Bronchiectasis/Kartageners/Situs inversus -followed by Dr Melvyn Novas pulmonary    4. Hyperlipidemia - on statin, on fenofibrate from other provider. - recent labs with pcp   6. Tachycardia - recent palpitations,noticed by pulse ox rates 130 on and off x 3 days - resolved on its own.   7. COVID  - infection in January, has recovered  8. HTN - home bp's SBP 130s - recent pulm visit 130/58  Past Medical History:  Diagnosis Date  . Chronic diastolic heart failure ([place])   . COPD (chronic obstructive pulmonary disease) ([place])   . Depression   . Dextrocardia   . Diabetes mellitus   . GERD (gastroesophageal reflux disease)   . H. pylori infection 11/09/2012   treated with pylera  . HLD (hyperlipidemia)   . HTN (hypertension)    Cholesterol  . Iron deficiency anemia, unspecified 10/18/2012  . Neuropathy   . Pneumonia   . Situs inversus totalis   . Tachycardia      No Known Allergies   Current Outpatient Medications  Medication Sig Dispense Refill  .  albuterol (PROAIR HFA) 108 (90 Base) MCG/ACT inhaler Inhale 2 puffs into the lungs every 4 (four) hours as needed for wheezing or shortness of breath. 1 Inhaler 2  . aspirin EC 81 MG tablet Take 81 mg by mouth daily.    . budesonide-formoterol (SYMBICORT) 160-4.5 MCG/ACT inhaler Inhale 2 puffs into the lungs 2 (two) times daily. 1 Inhaler 5  . calcium-vitamin D (OSCAL WITH D) 500-200 MG-UNIT tablet Take 1 tablet by mouth.    . cetirizine (ZYRTEC) 10 MG tablet Take 10 mg by mouth daily.    . Cinnamon 500 MG capsule Take 2,000 mg by mouth daily.     Marland Kitchen co-enzyme Q-10 30 MG capsule Take 30 mg by mouth 3 (three) times daily.    . DULoxetine (CYMBALTA) 60 MG capsule Take 60 mg by mouth daily.    . fenofibrate (TRICOR) 145 MG tablet Take 145 mg by mouth every evening.     . ferrous sulfate 325 (65 FE) MG tablet Take 325-650 mg by mouth See admin instructions. Take two tablets on Monday Wednesday, Friday. And 1 tablet on all other days    . folic acid (FOLVITE) 1 MG tablet Take 1 mg by mouth daily.    . furosemide (LASIX) 40 MG tablet TAKE 1 AND 1/2 TABLET IN THE MORNING AND 1 TABLET IN THE EVENING. (Patient taking differently: Take 40-60 mg by mouth See admin instructions. 60 mg in the  Morning,40 mg in the evening) 225 tablet 1  . gabapentin (NEURONTIN) 600  MG tablet Take 1,200 mg by mouth 3 (three) times daily.    Marland Kitchen guaiFENesin (MUCINEX) 600 MG 12 hr tablet Take 600 mg by mouth 1 day or 1 dose.    Marland Kitchen HYDROcodone-acetaminophen (NORCO) 5-325 MG tablet Take 1-2 tablets by mouth 3 (three) times daily as needed. 30 tablet 0  . insulin detemir (LEVEMIR) 100 UNIT/ML injection Inject 60 Units into the skin at bedtime.     Marland Kitchen LINZESS 145 MCG CAPS capsule TAKE ONE CAPSULE BY MOUTH ONCE DAILY BEFORE BREAKFAST. (Patient taking differently: Take 145 mcg by mouth daily before breakfast. ) 90 capsule 3  . losartan (COZAAR) 100 MG tablet Take 100 mg by mouth daily.    . metFORMIN (GLUCOPHAGE) 1000 MG tablet Take 1,000 mg  by mouth 2 (two) times daily with a meal.    . metoprolol tartrate (LOPRESSOR) 25 MG tablet Take 1 tablet (25 mg total) by mouth 2 (two) times daily. 180 tablet 3  . Multiple Vitamin (MULTIVITAMIN) tablet Take 1 tablet by mouth daily.    . ondansetron (ZOFRAN) 4 MG tablet Take 1-2 tablets (4-8 mg total) by mouth every 8 (eight) hours as needed for nausea or vomiting. 20 tablet 0  . pantoprazole (PROTONIX) 40 MG tablet Take 1 tablet (40 mg total) by mouth daily. 30 tablet 11  . pioglitazone (ACTOS) 45 MG tablet Take 45 mg by mouth daily.    Marland Kitchen rOPINIRole (REQUIP) 2 MG tablet Take 2 mg by mouth at bedtime.     . rosuvastatin (CRESTOR) 40 MG tablet Take 40 mg by mouth every evening.     . sitaGLIPtin (JANUVIA) 50 MG tablet Take 50 mg by mouth daily.     . traMADol (ULTRAM) 50 MG tablet Take 50-100 mg by mouth every 6 (six) hours as needed for moderate pain.     . traZODone (DESYREL) 50 MG tablet Take 150 mg by mouth at bedtime.      No current facility-administered medications for this visit.     Past Surgical History:  Procedure Laterality Date  . BIOPSY  01/09/2020   Procedure: BIOPSY;  Surgeon: Daneil Dolin, MD;  [facility];  Service: Endoscopy;;  . CARPAL TUNNEL RELEASE Right 02/12/2020   Procedure: RIGHT CARPAL TUNNEL RELEASE;  Surgeon: Leandrew Koyanagi, MD;  Location: [facility];  Service: Orthopedics;  Laterality: Right;  . CATARACT EXTRACTION, BILATERAL  2016  . COLONOSCOPY WITH ESOPHAGOGASTRODUODENOSCOPY (EGD) N/A 11/09/2012   Dr. 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 hpylori on bx- treated with pylera. TCS- normal rectum. 1 diminutive polyp in the mid descending segment. 1-53mm polyp in the mid desending segment o/w the remainder of the colonic mucosa appeared normal. tubular adenoma on bx  . COLONOSCOPY WITH PROPOFOL N/A 01/09/2020   Procedure: COLONOSCOPY WITH PROPOFOL;   Surgeon: Daneil Dolin, MD;  Four 3-5 mm polyps in the hepatic flexure and cecum resected and retrieved, otherwise normal exam.  Pathology with tubular adenomas.  Recommended repeat colonoscopy in 3 years.  . ESOPHAGOGASTRODUODENOSCOPY (EGD) WITH PROPOFOL N/A 01/09/2020   Procedure: ESOPHAGOGASTRODUODENOSCOPY (EGD) WITH PROPOFOL;  Surgeon: Daneil Dolin, MD; normal exam.  Negative for H. pylori   . GIVENS CAPSULE STUDY N/A 10/07/2013   no source for anemia or heme positive stool noted  . NECK SURGERY     bone spurs  . POLYPECTOMY  01/09/2020   Procedure: POLYPECTOMY;  Surgeon: Daneil Dolin, MD;  [facility];  Service: Endoscopy;;     No Known Allergies    Family History  Problem Relation Age of Onset  . Heart attack Mother   . Diabetes Sister        Fx  . Heart defect Sister        Fx  . Arthritis Sister        Fx  . Asthma Sister        Fx  . Kidney disease Sister        Fx  . Pancreatitis Sister   . Colon cancer Neg Hx      Social History Mr. Chilson reports that he has never smoked. His smokeless tobacco use includes chew. Mr. Byers reports no history of alcohol use.   Review of Systems CONSTITUTIONAL: No weight loss, fever, chills, weakness or fatigue.  HEENT: Eyes: No visual loss, blurred vision, double vision or yellow sclerae.No hearing loss, sneezing, congestion, runny nose or sore throat.  SKIN: No rash or itching.  CARDIOVASCULAR: per hpi RESPIRATORY: No shortness of breath, cough or sputum.  GASTROINTESTINAL: No anorexia, nausea, vomiting or diarrhea. No abdominal pain or blood.  GENITOURINARY: No burning on urination, no polyuria NEUROLOGICAL: No headache, dizziness, syncope, paralysis, ataxia, numbness or tingling in the extremities. No change in bowel or bladder control.  MUSCULOSKELETAL: No muscle, back pain, joint pain or stiffness.  LYMPHATICS: No enlarged nodes. No history of splenectomy.  PSYCHIATRIC: No history of depression or  anxiety.  ENDOCRINOLOGIC: No reports of sweating, cold or heat intolerance. No polyuria or polydipsia.  Marland Kitchen   Physical Examination Today's Vitals   04/14/20 0854  BP: 138/60  Pulse: 78  SpO2: 97%  Weight: 215 lb (97.5 kg)  Height: 5\' 7"  (1.702 m)   Body mass index is 33.67 kg/m.  Gen: resting comfortably, no acute distress HEENT: no scleral icterus, pupils equal round and reactive, no palptable cervical adenopathy,  CV: RRR,no m/r/g, no jvd Resp: Clear to auscultation bilaterally GI: abdomen is soft, non-tender, non-distended, normal bowel sounds, no hepatosplenomegaly MSK: extremities are warm, no edema.  Skin: warm, no rash Neuro:  no focal deficits Psych: appropriate affect   Diagnostic Studies 05/2018 coronary CTA FINDINGS: Non-cardiac: See separate report from [facility].  Situs inversus totalis. The heart is on the right, liver on the left.  Pulmonary veins drain normally to the left atrium.  Calcium Score: 81 Agatston units.  Coronary Arteries: Right dominant with no anomalies  LM: Calcified plaque, no significant stenosis.  LAD system: Small caliber LAD. Mixed plaque proximal to mid LAD, possible moderate stenosis.  Circumflex system: No plaque or stenosis.  RCA system: Mixed plaque distal RCA, no significant stenosis.  IMPRESSION: 1. Situs inversus totalis.  2. Coronary artery calcium score 81 Agatston units. This places the patient in the 51st percentile for age and gender, suggesting intermediate risk for future cardiac events.  3. The LAD is small in caliber with mixed plaque in the proximal to mid portion of the vessel. Cannot rule out moderate stenosis, will send for FFR.  FINDINGS: FFR 0.76 in the mid LAD.  IMPRESSION: Suspect hemodynamically significant proximal LAD stenosis.     Assessment and Plan  1Chronic diastolic HF -controlled, continue diuretic  2. Elevated troponin/CAD - elevated trop in  setting of CHF and hypoxia on prior admission - suspect probable demand ischemiain setting of chronic obstructive CAD based on his recent coronary CTA which showed mid LAD disease FFR 0.76 -in absence of recurrent cardiac  symptom we will continue to manage this medically  - no symptoms, continue medical thearpy at this time.     3. 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 -followed by Dr Melvyn Novas  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   5. Hyperlipidemia -request labs from pcp, continue statin  6. HTN - at goal, continue current meds   F/u 6 months    Arnoldo Lenis, [name]

## 2020-04-15 ENCOUNTER — Other Ambulatory Visit: Payer: Self-pay

## 2020-04-15 ENCOUNTER — Encounter (HOSPITAL_BASED_OUTPATIENT_CLINIC_OR_DEPARTMENT_OTHER): Payer: Self-pay | Admitting: Orthopaedic Surgery

## 2020-04-15 ENCOUNTER — Ambulatory Visit: Payer: Medicare HMO | Admitting: Cardiology

## 2020-04-20 ENCOUNTER — Encounter (HOSPITAL_BASED_OUTPATIENT_CLINIC_OR_DEPARTMENT_OTHER)
Admission: RE | Admit: 2020-04-20 | Discharge: 2020-04-20 | Disposition: A | Discharge status: Home / Self Care | Payer: Medicare HMO | Source: Ambulatory Visit | Attending: Orthopaedic Surgery | Admitting: Orthopaedic Surgery

## 2020-04-20 ENCOUNTER — Other Ambulatory Visit (HOSPITAL_COMMUNITY)
Admission: RE | Admit: 2020-04-20 | Discharge: 2020-04-20 | Disposition: A | Discharge status: Home / Self Care | Payer: Medicare HMO | Source: Ambulatory Visit | Attending: Orthopaedic Surgery | Admitting: Orthopaedic Surgery

## 2020-04-20 DIAGNOSIS — Z794 Long term (current) use of insulin: Secondary | ICD-10-CM | POA: Diagnosis not present

## 2020-04-20 DIAGNOSIS — G629 Polyneuropathy, unspecified: Secondary | ICD-10-CM | POA: Diagnosis not present

## 2020-04-20 DIAGNOSIS — Z7951 Long term (current) use of inhaled steroids: Secondary | ICD-10-CM | POA: Diagnosis not present

## 2020-04-20 DIAGNOSIS — I110 Hypertensive heart disease with heart failure: Secondary | ICD-10-CM | POA: Diagnosis not present

## 2020-04-20 DIAGNOSIS — E119 Type 2 diabetes mellitus without complications: Secondary | ICD-10-CM | POA: Diagnosis not present

## 2020-04-20 DIAGNOSIS — Z01812 Encounter for preprocedural laboratory examination: Secondary | ICD-10-CM | POA: Diagnosis present

## 2020-04-20 DIAGNOSIS — D649 Anemia, unspecified: Secondary | ICD-10-CM | POA: Diagnosis not present

## 2020-04-20 DIAGNOSIS — Z79899 Other long term (current) drug therapy: Secondary | ICD-10-CM | POA: Diagnosis not present

## 2020-04-20 DIAGNOSIS — Z20822 Contact with and (suspected) exposure to covid-19: Secondary | ICD-10-CM | POA: Diagnosis not present

## 2020-04-20 DIAGNOSIS — I5032 Chronic diastolic (congestive) heart failure: Secondary | ICD-10-CM | POA: Diagnosis not present

## 2020-04-20 DIAGNOSIS — K219 Gastro-esophageal reflux disease without esophagitis: Secondary | ICD-10-CM | POA: Diagnosis not present

## 2020-04-20 DIAGNOSIS — J449 Chronic obstructive pulmonary disease, unspecified: Secondary | ICD-10-CM | POA: Diagnosis not present

## 2020-04-20 DIAGNOSIS — F329 Major depressive disorder, single episode, unspecified: Secondary | ICD-10-CM | POA: Diagnosis not present

## 2020-04-20 DIAGNOSIS — E785 Hyperlipidemia, unspecified: Secondary | ICD-10-CM | POA: Diagnosis not present

## 2020-04-20 DIAGNOSIS — G5602 Carpal tunnel syndrome, left upper limb: Secondary | ICD-10-CM | POA: Diagnosis present

## 2020-04-20 DIAGNOSIS — Z7982 Long term (current) use of aspirin: Secondary | ICD-10-CM | POA: Diagnosis not present

## 2020-04-20 LAB — BASIC METABOLIC PANEL
Anion gap: 11 (ref 5–15)
BUN: 18 mg/dL (ref 8–23)
CO2: 28 mmol/L (ref 22–32)
Calcium: 9.7 mg/dL (ref 8.9–10.3)
Chloride: 95 mmol/L — ABNORMAL LOW (ref 98–111)
Creatinine, Ser: 0.94 mg/dL (ref 0.61–1.24)
GFR calc Af Amer: >60 mL/min (ref >60)
GFR calc non Af Amer: >60 mL/min (ref >60)
Glucose, Bld: 82 mg/dL (ref 70–99)
Potassium: 5 mmol/L (ref 3.5–5.1)
Sodium: 134 mmol/L — ABNORMAL LOW (ref 135–145)

## 2020-04-20 LAB — SARS CORONAVIRUS 2 (TAT 6-24 HRS): SARS Coronavirus 2: NEGATIVE

## 2020-04-20 NOTE — Progress Notes (Signed)

## 2020-04-22 ENCOUNTER — Encounter (HOSPITAL_BASED_OUTPATIENT_CLINIC_OR_DEPARTMENT_OTHER): Payer: Self-pay | Admitting: Orthopaedic Surgery

## 2020-04-22 ENCOUNTER — Encounter (HOSPITAL_BASED_OUTPATIENT_CLINIC_OR_DEPARTMENT_OTHER)
Admission: RE | Disposition: A | Discharge status: Home / Self Care | Payer: Self-pay | Source: Home / Self Care | Attending: Orthopaedic Surgery

## 2020-04-22 ENCOUNTER — Ambulatory Visit (HOSPITAL_BASED_OUTPATIENT_CLINIC_OR_DEPARTMENT_OTHER): Payer: Medicare HMO | Admitting: Certified Registered"

## 2020-04-22 ENCOUNTER — Ambulatory Visit (HOSPITAL_BASED_OUTPATIENT_CLINIC_OR_DEPARTMENT_OTHER)
Admission: RE | Admit: 2020-04-22 | Discharge: 2020-04-22 | Disposition: A | Discharge status: Home / Self Care | Payer: Medicare HMO | Attending: Orthopaedic Surgery | Admitting: Orthopaedic Surgery

## 2020-04-22 ENCOUNTER — Other Ambulatory Visit: Payer: Self-pay

## 2020-04-22 DIAGNOSIS — G5602 Carpal tunnel syndrome, left upper limb: Secondary | ICD-10-CM | POA: Insufficient documentation

## 2020-04-22 DIAGNOSIS — Z794 Long term (current) use of insulin: Secondary | ICD-10-CM | POA: Insufficient documentation

## 2020-04-22 DIAGNOSIS — J449 Chronic obstructive pulmonary disease, unspecified: Secondary | ICD-10-CM | POA: Diagnosis not present

## 2020-04-22 DIAGNOSIS — Z7951 Long term (current) use of inhaled steroids: Secondary | ICD-10-CM | POA: Insufficient documentation

## 2020-04-22 DIAGNOSIS — K219 Gastro-esophageal reflux disease without esophagitis: Secondary | ICD-10-CM | POA: Insufficient documentation

## 2020-04-22 DIAGNOSIS — G629 Polyneuropathy, unspecified: Secondary | ICD-10-CM | POA: Insufficient documentation

## 2020-04-22 DIAGNOSIS — I5032 Chronic diastolic (congestive) heart failure: Secondary | ICD-10-CM | POA: Insufficient documentation

## 2020-04-22 DIAGNOSIS — F329 Major depressive disorder, single episode, unspecified: Secondary | ICD-10-CM | POA: Insufficient documentation

## 2020-04-22 DIAGNOSIS — I110 Hypertensive heart disease with heart failure: Secondary | ICD-10-CM | POA: Insufficient documentation

## 2020-04-22 DIAGNOSIS — Z79899 Other long term (current) drug therapy: Secondary | ICD-10-CM | POA: Insufficient documentation

## 2020-04-22 DIAGNOSIS — D649 Anemia, unspecified: Secondary | ICD-10-CM | POA: Insufficient documentation

## 2020-04-22 DIAGNOSIS — Z7982 Long term (current) use of aspirin: Secondary | ICD-10-CM | POA: Insufficient documentation

## 2020-04-22 DIAGNOSIS — E785 Hyperlipidemia, unspecified: Secondary | ICD-10-CM | POA: Insufficient documentation

## 2020-04-22 DIAGNOSIS — E119 Type 2 diabetes mellitus without complications: Secondary | ICD-10-CM | POA: Insufficient documentation

## 2020-04-22 HISTORY — PX: CARPAL TUNNEL RELEASE: SHX101

## 2020-04-22 LAB — GLUCOSE, CAPILLARY
Glucose-Capillary: 97 mg/dL (ref 70–99)
Glucose-Capillary: 89 mg/dL (ref 70–99)

## 2020-04-22 SURGERY — CARPAL TUNNEL RELEASE
Anesthesia: Regional | Site: Wrist | Laterality: Left

## 2020-04-22 MED ORDER — LIDOCAINE HCL (CARDIAC) PF 100 MG/5ML IV SOSY
PREFILLED_SYRINGE | INTRAVENOUS | Status: DC | PRN
  Administered 2020-04-22: 30 mg via INTRAVENOUS

## 2020-04-22 MED ORDER — AMISULPRIDE (ANTIEMETIC) 5 MG/2ML IV SOLN: 10.0000 mg | Freq: Once | INTRAVENOUS | Status: DC | PRN

## 2020-04-22 MED ORDER — HYDROMORPHONE HCL 1 MG/ML IJ SOLN: 0.2500 mg | INTRAMUSCULAR | Status: DC | PRN

## 2020-04-22 MED ORDER — BUPIVACAINE HCL (PF) 0.25 % IJ SOLN
INTRAMUSCULAR | Status: AC
  Filled 2020-04-22: qty 30

## 2020-04-22 MED ORDER — MIDAZOLAM HCL 2 MG/2ML IJ SOLN
INTRAMUSCULAR | Status: AC
  Filled 2020-04-22: qty 2

## 2020-04-22 MED ORDER — HYDROCODONE-ACETAMINOPHEN 5-325 MG PO TABS: 1.0000 | ORAL_TABLET | Freq: Three times a day (TID) | ORAL | 0 refills | Status: AC | PRN

## 2020-04-22 MED ORDER — PROMETHAZINE HCL 25 MG/ML IJ SOLN: 6.2500 mg | INTRAMUSCULAR | Status: DC | PRN

## 2020-04-22 MED ORDER — OXYCODONE HCL 5 MG PO TABS: 5.0000 mg | ORAL_TABLET | Freq: Once | ORAL | Status: DC | PRN

## 2020-04-22 MED ORDER — BUPIVACAINE HCL (PF) 0.25 % IJ SOLN
INTRAMUSCULAR | Status: DC | PRN
  Administered 2020-04-22: 10 mL

## 2020-04-22 MED ORDER — FENTANYL CITRATE (PF) 100 MCG/2ML IJ SOLN
INTRAMUSCULAR | Status: AC
  Filled 2020-04-22: qty 2

## 2020-04-22 MED ORDER — LIDOCAINE HCL (PF) 0.5 % IJ SOLN
INTRAMUSCULAR | Status: DC | PRN
  Administered 2020-04-22: 30 mL via INTRAVENOUS

## 2020-04-22 MED ORDER — ONDANSETRON HCL 4 MG/2ML IJ SOLN
INTRAMUSCULAR | Status: DC | PRN
  Administered 2020-04-22: 4 mg via INTRAVENOUS

## 2020-04-22 MED ORDER — OXYCODONE HCL 5 MG/5ML PO SOLN: 5.0000 mg | Freq: Once | ORAL | Status: DC | PRN

## 2020-04-22 MED ORDER — LACTATED RINGERS IV SOLN: INTRAVENOUS | Status: DC

## 2020-04-22 MED ORDER — MIDAZOLAM HCL 5 MG/5ML IJ SOLN
INTRAMUSCULAR | Status: DC | PRN
  Administered 2020-04-22: 1 mg via INTRAVENOUS

## 2020-04-22 MED ORDER — PROPOFOL 500 MG/50ML IV EMUL
INTRAVENOUS | Status: DC | PRN
  Administered 2020-04-22: 25 ug/kg/min via INTRAVENOUS

## 2020-04-22 MED ORDER — 0.9 % SODIUM CHLORIDE (POUR BTL) OPTIME
TOPICAL | Status: DC | PRN
  Administered 2020-04-22: 1000 mL

## 2020-04-22 MED ORDER — CEFAZOLIN SODIUM-DEXTROSE 2-4 GM/100ML-% IV SOLN
2.0000 g | INTRAVENOUS | Status: AC
  Administered 2020-04-22: 2 g via INTRAVENOUS

## 2020-04-22 MED ORDER — CEFAZOLIN SODIUM-DEXTROSE 2-4 GM/100ML-% IV SOLN
INTRAVENOUS | Status: AC
  Filled 2020-04-22: qty 100

## 2020-04-22 MED ORDER — FENTANYL CITRATE (PF) 100 MCG/2ML IJ SOLN
INTRAMUSCULAR | Status: DC | PRN
  Administered 2020-04-22: 50 ug via INTRAVENOUS

## 2020-04-22 SURGICAL SUPPLY — 50 items
BAND INSRT 18 STRL LF DISP RB (MISCELLANEOUS) ×2
BAND RUBBER #18 3X1/16 STRL (MISCELLANEOUS) ×4 IMPLANT
BLADE MINI RND TIP GREEN BEAV (BLADE) ×2 IMPLANT
BLADE SURG 15 STRL LF DISP TIS (BLADE) ×1 IMPLANT
BLADE SURG 15 STRL SS (BLADE) ×2
BNDG CMPR 9X4 STRL LF SNTH (GAUZE/BANDAGES/DRESSINGS) ×1
BNDG ELASTIC 3X5.8 VLCR STR LF (GAUZE/BANDAGES/DRESSINGS) ×2 IMPLANT
BNDG ESMARK 4X9 LF (GAUZE/BANDAGES/DRESSINGS) ×2 IMPLANT
BNDG PLASTER X FAST 3X3 WHT LF (CAST SUPPLIES) IMPLANT
BNDG PLSTR 9X3 FST ST WHT (CAST SUPPLIES)
BRUSH SCRUB EZ PLAIN DRY (MISCELLANEOUS) ×2 IMPLANT
CANISTER SUCT 1200ML W/VALVE (MISCELLANEOUS) ×2 IMPLANT
CORD BIPOLAR FORCEPS 12FT (ELECTRODE) ×2 IMPLANT
COVER BACK TABLE 60X90IN (DRAPES) ×2 IMPLANT
COVER MAYO STAND STRL (DRAPES) ×2 IMPLANT
COVER SURGICAL LIGHT HANDLE (MISCELLANEOUS) ×2 IMPLANT
COVER WAND RF STERILE (DRAPES) IMPLANT
CUFF TOURN SGL QUICK 18X4 (TOURNIQUET CUFF) ×2 IMPLANT
DECANTER SPIKE VIAL GLASS SM (MISCELLANEOUS) IMPLANT
DRAPE EXTREMITY T 121X128X90 (DISPOSABLE) ×2 IMPLANT
DRAPE HALF SHEET 70X43 (DRAPES) ×2 IMPLANT
DRAPE IMP U-DRAPE 54X76 (DRAPES) ×2 IMPLANT
DRAPE SURG 17X23 STRL (DRAPES) ×4 IMPLANT
GAUZE 4X4 16PLY RFD (DISPOSABLE) IMPLANT
GAUZE SPONGE 4X4 12PLY STRL (GAUZE/BANDAGES/DRESSINGS) ×2 IMPLANT
GAUZE XEROFORM 1X8 LF (GAUZE/BANDAGES/DRESSINGS) ×2 IMPLANT
GLOVE BIOGEL PI IND STRL 7.0 (GLOVE) ×3 IMPLANT
GLOVE BIOGEL PI INDICATOR 7.0 (GLOVE) ×3
GLOVE ECLIPSE 6.5 STRL STRAW (GLOVE) ×2 IMPLANT
GLOVE ECLIPSE 7.0 STRL STRAW (GLOVE) ×2 IMPLANT
GLOVE SKINSENSE NS SZ7.5 (GLOVE) ×1
GLOVE SKINSENSE STRL SZ7.5 (GLOVE) ×1 IMPLANT
GLOVE SURG SYN 7.5  E (GLOVE) ×2
GLOVE SURG SYN 7.5 E (GLOVE) ×1 IMPLANT
GOWN STRL REIN XL XLG (GOWN DISPOSABLE) ×2 IMPLANT
GOWN STRL REUS W/ TWL XL LVL3 (GOWN DISPOSABLE) ×2 IMPLANT
GOWN STRL REUS W/TWL XL LVL3 (GOWN DISPOSABLE) ×4
NEEDLE HYPO 25X1 1.5 SAFETY (NEEDLE) ×2 IMPLANT
NS IRRIG 1000ML POUR BTL (IV SOLUTION) ×2 IMPLANT
PACK BASIN DAY SURGERY FS (CUSTOM PROCEDURE TRAY) ×2 IMPLANT
PAD CAST 3X4 CTTN HI CHSV (CAST SUPPLIES) ×1 IMPLANT
PADDING CAST COTTON 3X4 STRL (CAST SUPPLIES) ×2
STOCKINETTE 4X48 STRL (DRAPES) ×2 IMPLANT
SUT ETHILON 3 0 PS 1 (SUTURE) ×2 IMPLANT
SYR BULB EAR ULCER 3OZ GRN STR (SYRINGE) ×2 IMPLANT
SYR CONTROL 10ML LL (SYRINGE) ×2 IMPLANT
TOWEL GREEN STERILE FF (TOWEL DISPOSABLE) ×2 IMPLANT
TRAY DSU PREP LF (CUSTOM PROCEDURE TRAY) ×2 IMPLANT
TUBE CONNECTING 20X1/4 (TUBING) IMPLANT
UNDERPAD 30X36 HEAVY ABSORB (UNDERPADS AND DIAPERS) ×2 IMPLANT

## 2020-04-22 NOTE — Transfer of Care (Signed)
Immediate Anesthesia Transfer of Care Note  Patient: Brett [name]  Procedure(s) Performed: LEFT CARPAL TUNNEL RELEASE (Left Wrist)  Patient Location: PACU  Anesthesia Type:Bier block  Level of Consciousness: awake, alert , oriented and patient cooperative  Airway & Oxygen Therapy: Patient Spontanous Breathing and Patient connected to face mask oxygen  Post-op Assessment: Report given to RN and Post -op Vital signs reviewed and stable  Post vital signs: Reviewed and stable  Last Vitals:  Vitals Value Taken Time  BP    Temp    Pulse 62 04/22/20 1213  Resp 19 04/22/20 1213  SpO2 99 % 04/22/20 1213  Vitals shown include unvalidated device data.  Last Pain:  Vitals:   04/22/20 1102  TempSrc: Oral  PainSc: 0-No pain         Complications: No complications documented.

## 2020-04-22 NOTE — H&P (Signed)
PREOPERATIVE H&P  Chief Complaint: left carpal tunnel syndrome  HPI: Brett [name] is a 69 y.o. male who presents for surgical treatment of left carpal tunnel syndrome.  He denies any changes in medical history.  Past Medical History:  Diagnosis Date  . Chronic diastolic heart failure ([place])   . COPD (chronic obstructive pulmonary disease) ([place])   . Depression   . Dextrocardia   . Diabetes mellitus   . GERD (gastroesophageal reflux disease)   . H. pylori infection 11/09/2012   treated with pylera  . HLD (hyperlipidemia)   . HTN (hypertension)    Cholesterol  . Iron deficiency anemia, unspecified 10/18/2012  . Neuropathy   . Pneumonia   . Situs inversus totalis   . Tachycardia    Past Surgical History:  Procedure Laterality Date  . BIOPSY  01/09/2020   Procedure: BIOPSY;  Surgeon: Daneil Dolin, MD;  [facility];  Service: Endoscopy;;  . CARPAL TUNNEL RELEASE Right 02/12/2020   Procedure: RIGHT CARPAL TUNNEL RELEASE;  Surgeon: Leandrew Koyanagi, MD;  Location: [facility];  Service: Orthopedics;  Laterality: Right;  . CATARACT EXTRACTION, BILATERAL  2016  . COLONOSCOPY WITH ESOPHAGOGASTRODUODENOSCOPY (EGD) N/A 11/09/2012   Dr. 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 hpylori on bx- treated with pylera. TCS- normal rectum. 1 diminutive polyp in the mid descending segment. 1-9mm polyp in the mid desending segment o/w the remainder of the colonic mucosa appeared normal. tubular adenoma on bx  . COLONOSCOPY WITH PROPOFOL N/A 01/09/2020   Procedure: COLONOSCOPY WITH PROPOFOL;  Surgeon: Daneil Dolin, MD;  Four 3-5 mm polyps in the hepatic flexure and cecum resected and retrieved, otherwise normal exam.  Pathology with tubular adenomas.  Recommended repeat colonoscopy in 3 years.  . ESOPHAGOGASTRODUODENOSCOPY (EGD) WITH PROPOFOL N/A 01/09/2020   Procedure:  ESOPHAGOGASTRODUODENOSCOPY (EGD) WITH PROPOFOL;  Surgeon: Daneil Dolin, MD; normal exam.  Negative for H. pylori   . GIVENS CAPSULE STUDY N/A 10/07/2013   no source for anemia or heme positive stool noted  . NECK SURGERY     bone spurs  . POLYPECTOMY  01/09/2020   Procedure: POLYPECTOMY;  Surgeon: Daneil Dolin, MD;  [facility];  Service: Endoscopy;;   Social History   Socioeconomic History  . Marital status: Married    Spouse name: Not on file  . Number of children: 0  . Years of education: 7th grade   . Highest education level: Not on file  Occupational History  . Occupation: Biomedical scientist   Tobacco Use  . Smoking status: Never Smoker  . Smokeless tobacco: Current User    Types: Chew  Vaping Use  . Vaping Use: Never used  Substance and Sexual Activity  . Alcohol use: No  . Drug use: No  . Sexual activity: Yes  Other Topics Concern  . Not on file  Social History Narrative  . Not on file   Social Determinants of Health   Financial Resource Strain:   . Difficulty of Paying Living Expenses:   Food Insecurity:   . Worried About Charity fundraiser in the Last Year:   . Arboriculturist in the Last Year:   Transportation Needs:   . Film/video editor (Medical):   Marland Kitchen Lack of Transportation (Non-Medical):   Physical Activity:   . Days of Exercise per Week:   . Minutes of Exercise per Session:  Stress:   . Feeling of Stress :   Social Connections:   . Frequency of Communication with Friends and Family:   . Frequency of Social Gatherings with Friends and Family:   . Attends Religious Services:   . Active Member of Clubs or Organizations:   . Attends Archivist Meetings:   Marland Kitchen Marital Status:    Family History  Problem Relation Age of Onset  . Heart attack Mother   . Diabetes Sister        Fx  . Heart defect Sister        Fx  . Arthritis Sister        Fx  . Asthma Sister        Fx  . Kidney disease Sister        Fx  . Pancreatitis  Sister   . Colon cancer Neg Hx    No Known Allergies Prior to Admission medications   Medication Sig Start Date End Date Taking? Authorizing Provider  albuterol (PROAIR HFA) 108 (90 Base) MCG/ACT inhaler Inhale 2 puffs into the lungs every 4 (four) hours as needed for wheezing or shortness of breath. 09/27/16  Yes Valentina Shaggy, MD  aspirin EC 81 MG tablet Take 81 mg by mouth daily.   Yes [provider]  budesonide-formoterol (SYMBICORT) 160-4.5 MCG/ACT inhaler Inhale 2 puffs into the lungs 2 (two) times daily. 05/21/19  Yes Johnson, Clanford L, MD  calcium-vitamin D (OSCAL WITH D) 500-200 MG-UNIT tablet Take 1 tablet by mouth.   Yes [provider]  cetirizine (ZYRTEC) 10 MG tablet Take 10 mg by mouth daily.   Yes [provider]  Cinnamon 500 MG capsule Take 2,000 mg by mouth daily.    Yes [provider]  co-enzyme Q-10 30 MG capsule Take 30 mg by mouth 3 (three) times daily.   Yes [provider]  DULoxetine (CYMBALTA) 60 MG capsule Take 60 mg by mouth daily.   Yes [provider]  fenofibrate (TRICOR) 145 MG tablet Take 145 mg by mouth every evening.  05/28/18  Yes [provider]  ferrous sulfate 325 (65 FE) MG tablet Take 325-650 mg by mouth See admin instructions. Take two tablets on Monday Wednesday, Friday. And 1 tablet on all other days   Yes [provider]  folic acid (FOLVITE) 1 MG tablet Take 1 mg by mouth daily.   Yes [provider]  furosemide (LASIX) 40 MG tablet TAKE 1 AND 1/2 TABLET IN THE MORNING AND 1 TABLET IN THE EVENING. Patient taking differently: Take 40-60 mg by mouth See admin instructions. 60 mg in the  Morning,40 mg in the evening 12/13/19  Yes Branch, Alphonse Guild, MD  gabapentin (NEURONTIN) 600 MG tablet Take 1,200 mg by mouth 3 (three) times daily.   Yes [provider]  guaiFENesin (MUCINEX) 600 MG 12 hr tablet Take 600 mg by mouth 1 day or 1 dose.   Yes [provider]  insulin detemir (LEVEMIR) 100 UNIT/ML injection Inject 60 Units into the skin at bedtime.    Yes [provider]  LINZESS 145 MCG CAPS capsule TAKE ONE CAPSULE BY MOUTH ONCE DAILY BEFORE BREAKFAST. Patient taking differently: Take 145 mcg by mouth daily before breakfast.  12/17/19  Yes Annitta Needs, NP  losartan (COZAAR) 100 MG tablet Take 100 mg by mouth daily.   Yes [provider]  metFORMIN (GLUCOPHAGE) 1000 MG tablet Take 1,000 mg by mouth 2 (two) times  daily with a meal.   Yes [provider]  metoprolol tartrate (LOPRESSOR) 25 MG tablet Take 1 tablet (25 mg total) by mouth 2 (two) times daily. 03/28/19  Yes BranchAlphonse Guild, MD  Multiple Vitamin (MULTIVITAMIN) tablet Take 1 tablet by mouth daily.   Yes [provider]  pantoprazole (PROTONIX) 40 MG tablet Take 1 tablet (40 mg total) by mouth daily. 10/21/19  Yes Aliene Altes S, PA-C  pioglitazone (ACTOS) 45 MG tablet Take 45 mg by mouth daily.   Yes [provider]  rOPINIRole (REQUIP) 2 MG tablet Take 2 mg by mouth at bedtime.    Yes [provider]  rosuvastatin (CRESTOR) 40 MG tablet Take 40 mg by mouth every evening.  03/22/18  Yes [provider]  sitaGLIPtin (JANUVIA) 50 MG tablet Take 50 mg by mouth daily.    Yes [provider]  traMADol (ULTRAM) 50 MG tablet Take 50-100 mg by mouth every 6 (six) hours as needed for moderate pain.  07/25/18  Yes [provider]  traZODone (DESYREL) 50 MG tablet Take 150 mg by mouth at bedtime.  07/03/18  Yes [provider]  HYDROcodone-acetaminophen (NORCO) 5-325 MG tablet Take 1-2 tablets by mouth 3 (three) times daily as needed. 02/12/20   Leandrew Koyanagi, MD  ondansetron (ZOFRAN) 4 MG tablet Take 1-2 tablets (4-8 mg total) by mouth every 8 (eight) hours as needed for nausea or vomiting. 02/12/20   Leandrew Koyanagi, MD     Positive ROS: All other systems have been reviewed and were otherwise negative  with the exception of those mentioned in the HPI and as above.  Physical Exam: General: Alert, no acute distress Cardiovascular: No pedal edema Respiratory: No cyanosis, no use of accessory musculature GI: abdomen soft Skin: No lesions in the area of chief complaint Neurologic: Sensation intact distally Psychiatric: Patient is competent for consent with normal mood and affect Lymphatic: no lymphedema  MUSCULOSKELETAL: exam stable  Assessment: left carpal tunnel syndrome  Plan: Plan for Procedure(s): LEFT CARPAL TUNNEL RELEASE  The risks benefits and alternatives were discussed with the patient including but not limited to the risks of nonoperative treatment, versus surgical intervention including infection, bleeding, nerve injury,  blood clots, cardiopulmonary complications, morbidity, mortality, among others, and they were willing to proceed.   Preoperative templating of the joint replacement has been completed, documented, and submitted to the Operating Room personnel in order to optimize intra-operative equipment management.   Eduard Roux, MD 04/22/2020 7:01 AM

## 2020-04-22 NOTE — Anesthesia Preprocedure Evaluation (Signed)
Anesthesia Evaluation  Patient identified by MRN, date of birth, ID band Patient awake    Reviewed: Allergy & Precautions, NPO status , Patient's Chart, lab work & pertinent test results  Airway Mallampati: II  TM Distance: >3 FB Neck ROM: Full    Dental no notable dental hx. (+) Edentulous Lower, Upper Dentures   Pulmonary asthma , COPD,  COPD inhaler,    Pulmonary exam normal breath sounds clear to auscultation       Cardiovascular Exercise Tolerance: Good hypertension, Pt. on medications and Pt. on home beta blockers +CHF  Normal cardiovascular exam Rhythm:Regular Rate:Normal     Neuro/Psych PSYCHIATRIC DISORDERS Depression  Neuromuscular disease negative neurological ROS     GI/Hepatic GERD  Medicated,  Endo/Other  diabetes, Type 2, Insulin Dependent  Renal/GU      Musculoskeletal negative musculoskeletal ROS (+)   Abdominal (+) + obese,   Peds  Hematology  (+) Blood dyscrasia, anemia ,   Anesthesia Other Findings   Reproductive/Obstetrics negative OB ROS                            Anesthesia Physical Anesthesia Plan  ASA: III  Anesthesia Plan: Bier Block and Bier Block-LIDOCAINE ONLY   Post-op Pain Management:    Induction:   PONV Risk Score and Plan: 1 and Treatment may vary due to age or medical condition  Airway Management Planned: Nasal Cannula  Additional Equipment: None  Intra-op Plan:   Post-operative Plan:   Informed Consent: I have reviewed the patients History and Physical, chart, labs and discussed the procedure including the risks, benefits and alternatives for the proposed anesthesia with the patient or authorized representative who has indicated his/her understanding and acceptance.     Dental advisory given  Plan Discussed with:   Anesthesia Plan Comments: (Bier block w mac)       Anesthesia Quick Evaluation  

## 2020-04-22 NOTE — Discharge Instructions (Signed)
Postoperative instructions:  Weightbearing instructions: no lifting more than 10 lbs.  Dressing instructions: Keep your dressing and/or splint clean and dry at all times.  It will be removed at your first post-operative appointment.  Your stitches and/or staples will be removed at this visit.  Incision instructions:  Do not soak your incision for 3 weeks after surgery.  If the incision gets wet, pat dry and do not scrub the incision.  Pain control:  You have been given a prescription to be taken as directed for post-operative pain control.  In addition, elevate the operative extremity above the heart at all times to prevent swelling and throbbing pain.  Take over-the-counter Colace, 100mg  by mouth twice a day while taking narcotic pain medications to help prevent constipation.  Follow up appointments: 1) 7 days for and wound check. 2) Dr. Erlinda Hong as scheduled.   -------------------------------------------------------------------------------------------------------------  After Surgery Pain Control:  After your surgery, post-surgical discomfort or pain is likely. This discomfort can last several days to a few weeks. At certain times of the day your discomfort may be more intense.  Did you receive a nerve block?  A nerve block can provide pain relief for one hour to two days after your surgery. As long as the nerve block is working, you will experience little or no sensation in the area the surgeon operated on.  As the nerve block wears off, you will begin to experience pain or discomfort. It is very important that you begin taking your prescribed pain medication before the nerve block fully wears off. Treating your pain at the first sign of the block wearing off will ensure your pain is better controlled and more tolerable when full-sensation returns. Do not wait until the pain is intolerable, as the medicine will be less effective. It is better to treat pain in advance than to try and catch up.   General Anesthesia:  If you did not receive a nerve block during your surgery, you will need to start taking your pain medication shortly after your surgery and should continue to do so as prescribed by your surgeon.  Pain Medication:  Most commonly we prescribe Vicodin and Percocet for post-operative pain. Both of these medications contain a combination of acetaminophen (Tylenol) and a narcotic to help control pain.   It takes between 30 and 45 minutes before pain medication starts to work. It is important to take your medication before your pain level gets too intense.   Nausea is a common side effect of many pain medications. You will want to eat something before taking your pain medicine to help prevent nausea.   If you are taking a prescription pain medication that contains acetaminophen, we recommend that you do not take additional over the counter acetaminophen (Tylenol).  Other pain relieving options:   Using a cold pack to ice the affected area a few times a day (15 to 20 minutes at a time) can help to relieve pain, reduce swelling and bruising.   Elevation of the affected area can also help to reduce pain and swelling.   Post Anesthesia Home Care Instructions  Activity: Get plenty of rest for the remainder of the day. A responsible individual must stay with you for 24 hours following the procedure.  For the next 24 hours, DO NOT: -Drive a car -Paediatric nurse -Drink alcoholic beverages -Take any medication unless instructed by your physician -Make any legal decisions or sign important papers.  Meals: Start with liquid foods such as gelatin  or soup. Progress to regular foods as tolerated. Avoid greasy, spicy, heavy foods. If nausea and/or vomiting occur, drink only clear liquids until the nausea and/or vomiting subsides. Call your physician if vomiting continues.  Special Instructions/Symptoms: Your throat may feel dry or sore from the anesthesia or the breathing tube  placed in your throat during surgery. If this causes discomfort, gargle with warm salt water. The discomfort should disappear within 24 hours.  If you had a scopolamine patch placed behind your ear for the management of post- operative nausea and/or vomiting:  1. The medication in the patch is effective for 72 hours, after which it should be removed.  Wrap patch in a tissue and discard in the trash. Wash hands thoroughly with soap and water. 2. You may remove the patch earlier than 72 hours if you experience unpleasant side effects which may include dry mouth, dizziness or visual disturbances. 3. Avoid touching the patch. Wash your hands with soap and water after contact with the patch.

## 2020-04-22 NOTE — Anesthesia Procedure Notes (Signed)
Procedure Name: MAC Date/Time: 04/22/2020 11:54 AM Performed by: Signe Colt, CRNA Pre-anesthesia Checklist: Patient identified, Emergency Drugs available, Suction available, Patient being monitored and Timeout performed Patient Re-evaluated:Patient Re-evaluated prior to induction Oxygen Delivery Method: Simple face mask

## 2020-04-22 NOTE — Op Note (Signed)
   Carpal tunnel op note  DATE OF SURGERY:04/22/2020  PREOPERATIVE DIAGNOSIS:  Left carpal tunnel syndrome  POSTOPERATIVE DIAGNOSIS: same  PROCEDURE:  Left carpal tunnel release. CPT [phone]  SURGEON: Surgeon(s): Leandrew Koyanagi, MD  ASSIST: None  ANESTHESIA:  Bier block, local  TOURNIQUET TIME:  20 minutes  BLOOD LOSS: Minimal.  COMPLICATIONS: None.  PATHOLOGY: None.  INDICATIONS: The patient is a 69 y.o. -year-old male who presented with carpal tunnel syndrome failing nonsurgical management, indicated for surgical release.  DESCRIPTION OF PROCEDURE: The patient was identified in the preoperative holding area.  The operative site was marked by the surgeon and confirmed by the patient.  He was brought back to the operating room.  Anesthesia was induced by the anesthesia team.  A well padded nonsterile tourniquet was placed. The operative extremity was prepped and draped in standard sterile fashion.  A timeout was performed.  Preoperative antibiotics were given.   A palmar incision was made about 5 mm ulnar to the thenar crease.  The palmar aponeurosis was exposed and divided in line with the skin incision. The palmaris brevis was visualized and divided.  The distal edge of the transcarpal ligament was identified. A hemostat was inserted into the carpal tunnel to protect the median nerve and the flexor tendons. Then, the transverse carpal ligament was released under direct visualization. Proximally, a subcutaneous tunnel was made allowing a Sewell retractor to be placed. Then, the distal portion of the antebrachial fascia was released. Distally, all fibrous bands were released. The median nerve was visualized, and the fat pad was exposed. Following release, local infiltration with 0.25% of Sensorcaine was given. The tourniquet was deflated. Hemostasis achieved.  Wound was irrigated and closed with 4-0 nylon sutures. Sterile dressing applied. The patient was transferred to the recovery room in  stable condition after all counts were correct.  POSTOPERATIVE PLAN: To start nerve gliding exercises as tolerated and no heavy lifting for four weeks.  Eduard Roux, [name] [facility] 12:13 PM

## 2020-04-23 ENCOUNTER — Ambulatory Visit: Payer: Medicare HMO | Admitting: Gastroenterology

## 2020-04-23 ENCOUNTER — Encounter (HOSPITAL_BASED_OUTPATIENT_CLINIC_OR_DEPARTMENT_OTHER): Payer: Self-pay | Admitting: Orthopaedic Surgery

## 2020-04-23 NOTE — Anesthesia Postprocedure Evaluation (Signed)
Anesthesia Post Note  Patient: Brett [name]  Procedure(s) Performed: LEFT CARPAL TUNNEL RELEASE (Left Wrist)     Patient location during evaluation: PACU Anesthesia Type: Bier Block Level of consciousness: awake and alert Pain management: pain level controlled Vital Signs Assessment: post-procedure vital signs reviewed and stable Respiratory status: spontaneous breathing, nonlabored ventilation and respiratory function stable Cardiovascular status: blood pressure returned to baseline and stable Postop Assessment: no apparent nausea or vomiting Anesthetic complications: no   No complications documented.  Last Vitals:  Vitals:   04/22/20 1245 04/22/20 1323  BP: (!) 106/43 (!) 110/91  Pulse: 60 60  Resp: 13 18  Temp:  36.5 C  SpO2: 94% 100%    Last Pain:  Vitals:   04/22/20 1323  TempSrc: Oral  PainSc: 0-No pain                 Lynda Rainwater

## 2020-04-29 ENCOUNTER — Other Ambulatory Visit: Payer: Self-pay | Admitting: Cardiology

## 2020-04-30 ENCOUNTER — Encounter: Payer: Self-pay | Admitting: Physician Assistant

## 2020-04-30 ENCOUNTER — Ambulatory Visit (INDEPENDENT_AMBULATORY_CARE_PROVIDER_SITE_OTHER): Payer: Medicare HMO | Admitting: Physician Assistant

## 2020-04-30 DIAGNOSIS — Z9889 Other specified postprocedural states: Secondary | ICD-10-CM

## 2020-04-30 NOTE — Progress Notes (Signed)
Post-Op Visit Note   Patient: Brett [name]           Date of Birth: [birth year]           MRN: [id number] Visit Date: 04/30/2020 PCP: [name], MD   Assessment & Plan:  Chief Complaint:  Chief Complaint  Patient presents with  . Left Hand - Pain   Visit Diagnoses:  1. S/P carpal tunnel release     Plan: Patient is a pleasant 69 year old gentleman who comes in today 8 days out left carpal tunnel release date 1121.  He has been doing fairly well.  He has been taking hydrocodone as needed.  No numbness, tingling or burning.  No fevers or chills.  Examination of his left wrist reveals well-healing surgical incision with nylon sutures in place.  No evidence of infection or cellulitis.  Fingers are warm and well-perfused.  Today, his wound was recovered.  Removable wrist splint was applied.  No heavy lifting or soaking his hand in water for another 3 weeks.  He will follow-up with [place] in 1 week's time for suture removal.  Call with concerns or questions in the meantime.  Follow-Up Instructions: Return in about 1 week (around 05/07/2020).   Orders:  No orders of the defined types were placed in this encounter.  No orders of the defined types were placed in this encounter.   Imaging: No new imaging  PMFS History: Patient Active Problem List   Diagnosis Date Noted  . Left carpal tunnel syndrome 12/20/2019  . History of adenomatous polyp of colon 10/21/2019  . Class 1 obesity due to excess calories with body mass index (BMI) of 32.0 to 32.9 in adult   . Acute hypoxemic respiratory failure ([place]) 07/09/2019  . Anemia   . Acute on chronic respiratory failure ([place]) 07/08/2019  . Acute respiratory failure with hypoxia ([place]) 05/19/2019  . Iron deficiency anemia 05/19/2019  . Leukocytosis 05/19/2019  . D-dimer, elevated 05/19/2019  . Dextrocardia   . Chewing tobacco nicotine dependence   . Constipation 07/19/2018  . 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 Chronic respiratory failure  with hypoxia ([place]) 07/16/2018  . HTN (hypertension) 07/16/2018  . Acute on chronic respiratory failure with hypoxia ([place]) 04/21/2018  . COPD exacerbation ([place]) 04/21/2018  . SVT (supraventricular tachycardia) ([place]) 04/14/2018  . Paroxysmal SVT (supraventricular tachycardia) ([place]) 04/14/2018  . Chronic diastolic CHF (congestive heart failure) ([place]) 04/14/2018  . CHF exacerbation ([place]) 04/10/2018  . 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 Restrictive lung disease 11/08/2016  . 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 Moderate persistent asthma, uncomplicated 82/95/6213  . Abdominal pain 09/23/2014  . Gastritis, Helicobacter pylori 08/65/7846  . Insulin-requiring or dependent type II diabetes mellitus ([place]) 01/03/2014  . 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 GERD (gastroesophageal reflux disease) 10/18/2012  . Nausea 10/18/2012  . Iron deficiency anemia due to chronic blood loss 10/18/2012  . HIP PAIN 08/31/2010  . SPINAL STENOSIS 06/28/2010  . TENDINITIS, CALCIFIC, SHOULDER, RIGHT 06/14/2010  . IMPINGEMENT SYNDROME 06/14/2010   Past Medical History:  Diagnosis Date  . Chronic diastolic heart failure ([place])   . COPD (chronic obstructive pulmonary disease) ([place])   . Depression   . Dextrocardia   . Diabetes mellitus   . GERD (gastroesophageal reflux disease)   . H. pylori infection 11/09/2012   treated with pylera  . HLD (hyperlipidemia)   . HTN (hypertension)    Cholesterol  . Iron deficiency anemia, unspecified 10/18/2012  . Neuropathy   .  Pneumonia   . Situs inversus totalis   . Tachycardia     Family History  Problem Relation Age of Onset  . Heart attack Mother   . Diabetes Sister        Fx  . Heart defect Sister        Fx  . Arthritis Sister        Fx  . Asthma Sister        Fx  . Kidney disease Sister        Fx  . Pancreatitis Sister   . Colon cancer Neg Hx     Past Surgical History:  Procedure Laterality Date  . BIOPSY  01/09/2020    Procedure: BIOPSY;  Surgeon: Daneil Dolin, MD;  [facility];  Service: Endoscopy;;  . CARPAL TUNNEL RELEASE Right 02/12/2020   Procedure: RIGHT CARPAL TUNNEL RELEASE;  Surgeon: Leandrew Koyanagi, MD;  Location: [facility];  Service: Orthopedics;  Laterality: Right;  . CARPAL TUNNEL RELEASE Left 04/22/2020   Procedure: LEFT CARPAL TUNNEL RELEASE;  Surgeon: Leandrew Koyanagi, MD;  Location: [place];  Service: Orthopedics;  Laterality: Left;  . CATARACT EXTRACTION, BILATERAL  2016  . COLONOSCOPY WITH ESOPHAGOGASTRODUODENOSCOPY (EGD) N/A 11/09/2012   Dr. 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 hpylori on bx- treated with pylera. TCS- normal rectum. 1 diminutive polyp in the mid descending segment. 1-53mm polyp in the mid desending segment o/w the remainder of the colonic mucosa appeared normal. tubular adenoma on bx  . COLONOSCOPY WITH PROPOFOL N/A 01/09/2020   Procedure: COLONOSCOPY WITH PROPOFOL;  Surgeon: Daneil Dolin, MD;  Four 3-5 mm polyps in the hepatic flexure and cecum resected and retrieved, otherwise normal exam.  Pathology with tubular adenomas.  Recommended repeat colonoscopy in 3 years.  . ESOPHAGOGASTRODUODENOSCOPY (EGD) WITH PROPOFOL N/A 01/09/2020   Procedure: ESOPHAGOGASTRODUODENOSCOPY (EGD) WITH PROPOFOL;  Surgeon: Daneil Dolin, MD; normal exam.  Negative for H. pylori   . GIVENS CAPSULE STUDY N/A 10/07/2013   no source for anemia or heme positive stool noted  . NECK SURGERY     bone spurs  . POLYPECTOMY  01/09/2020   Procedure: POLYPECTOMY;  Surgeon: Daneil Dolin, MD;  [facility];  Service: Endoscopy;;   Social History   Occupational History  . Occupation: Biomedical scientist   Tobacco Use  . Smoking status: Never Smoker  . Smokeless tobacco: Current User    Types: Chew  Vaping Use  . Vaping Use: Never used  Substance and Sexual Activity    . Alcohol use: No  . Drug use: No  . Sexual activity: Yes

## 2020-05-06 ENCOUNTER — Inpatient Hospital Stay (HOSPITAL_COMMUNITY): Payer: Medicare HMO | Attending: Hematology

## 2020-05-06 DIAGNOSIS — D500 Iron deficiency anemia secondary to blood loss (chronic): Secondary | ICD-10-CM | POA: Insufficient documentation

## 2020-05-06 LAB — CBC WITH DIFFERENTIAL/PLATELET
Abs Immature Granulocytes: 0.03 10*3/uL (ref 0.00–0.07)
Basophils Absolute: 0 10*3/uL (ref 0.0–0.1)
Basophils Relative: 1 %
Eosinophils Absolute: 0.2 10*3/uL (ref 0.0–0.5)
Eosinophils Relative: 2 %
HCT: 36.3 % — ABNORMAL LOW (ref 39.0–52.0)
Hemoglobin: 11.7 g/dL — ABNORMAL LOW (ref 13.0–17.0)
Immature Granulocytes: 0 %
Lymphocytes Relative: 17 %
Lymphs Abs: 1.4 10*3/uL (ref 0.7–4.0)
MCH: 30.2 pg (ref 26.0–34.0)
MCHC: 32.2 g/dL (ref 30.0–36.0)
MCV: 93.8 fL (ref 80.0–100.0)
Monocytes Absolute: 0.7 10*3/uL (ref 0.1–1.0)
Monocytes Relative: 9 %
Neutro Abs: 5.9 10*3/uL (ref 1.7–7.7)
Neutrophils Relative %: 71 %
Platelets: 362 10*3/uL (ref 150–400)
RBC: 3.87 MIL/uL — ABNORMAL LOW (ref 4.22–5.81)
RDW: 14.3 % (ref 11.5–15.5)
WBC: 8.3 10*3/uL (ref 4.0–10.5)
nRBC: 0 % (ref 0.0–0.2)

## 2020-05-06 LAB — COMPREHENSIVE METABOLIC PANEL
ALT: 21 U/L (ref 0–44)
AST: 28 U/L (ref 15–41)
Albumin: 4.3 g/dL (ref 3.5–5.0)
Alkaline Phosphatase: 37 U/L — ABNORMAL LOW (ref 38–126)
Anion gap: 11 (ref 5–15)
BUN: 19 mg/dL (ref 8–23)
CO2: 28 mmol/L (ref 22–32)
Calcium: 9.6 mg/dL (ref 8.9–10.3)
Chloride: 95 mmol/L — ABNORMAL LOW (ref 98–111)
Creatinine, Ser: 1.1 mg/dL (ref 0.61–1.24)
GFR calc Af Amer: >60 mL/min (ref >60)
GFR calc non Af Amer: >60 mL/min (ref >60)
Glucose, Bld: 90 mg/dL (ref 70–99)
Potassium: 4.1 mmol/L (ref 3.5–5.1)
Sodium: 134 mmol/L — ABNORMAL LOW (ref 135–145)
Total Bilirubin: 0.5 mg/dL (ref 0.3–1.2)
Total Protein: 7.4 g/dL (ref 6.5–8.1)

## 2020-05-06 LAB — VITAMIN B12: Vitamin B-12: 318 pg/mL (ref 180–914)

## 2020-05-06 LAB — IRON AND TIBC
Iron: 94 ug/dL (ref 45–182)
Saturation Ratios: 17 % — ABNORMAL LOW (ref 17.9–39.5)
TIBC: 553 ug/dL — ABNORMAL HIGH (ref 250–450)
UIBC: 459 ug/dL

## 2020-05-06 LAB — FERRITIN: Ferritin: 373 ng/mL — ABNORMAL HIGH (ref 24–336)

## 2020-05-06 LAB — FOLATE: Folate: 95.7 ng/mL (ref >5.9)

## 2020-05-12 ENCOUNTER — Ambulatory Visit (INDEPENDENT_AMBULATORY_CARE_PROVIDER_SITE_OTHER): Payer: Medicare HMO | Admitting: Physician Assistant

## 2020-05-12 ENCOUNTER — Encounter: Payer: Self-pay | Admitting: Orthopaedic Surgery

## 2020-05-12 VITALS — Ht 67.0 in | Wt 212.0 lb

## 2020-05-12 DIAGNOSIS — G5602 Carpal tunnel syndrome, left upper limb: Secondary | ICD-10-CM

## 2020-05-12 DIAGNOSIS — Z9889 Other specified postprocedural states: Secondary | ICD-10-CM

## 2020-05-12 MED ORDER — MUPIROCIN 2 % EX OINT: TOPICAL_OINTMENT | CUTANEOUS | 0 refills | Status: DC

## 2020-05-12 NOTE — Progress Notes (Signed)
Post-Op Visit Note   Patient: Brett [name]           Date of Birth: [birth year]           MRN: [id number] Visit Date: 05/12/2020 PCP: [name], MD   Assessment & Plan:  Chief Complaint:  Chief Complaint  Patient presents with   Left Wrist - Routine Post Op    Left carpal tunnel release 04/22/2020   Visit Diagnoses:  1. Left carpal tunnel syndrome   2. S/P carpal tunnel release     Plan: Patient is a pleasant 69 year old gentleman who comes in today 3 weeks out left carpal tunnel release 04/22/2020.  He has been doing fairly well.  He still notes occasional burning and pain but nothing more.  Examination of his left wrist reveals a well-healing surgical incision.  There is one small superficial area to the distal incision that has slightly opened.  No signs of infection or cellulitis.  Today, sutures were removed.  Mupirocin ointment and a Band-Aid was applied.  Avva called in a prescription of mupirocin which she will apply twice daily and cover with a Band-Aid for the next few weeks.  He will follow-up with [place] in 3 weeks time when he is 6 weeks out for wound check.  He will call with concerns or questions in meantime.  Follow-Up Instructions: Return in about 3 weeks (around 06/02/2020).   Orders:  No orders of the defined types were placed in this encounter.  Meds ordered this encounter  Medications   mupirocin ointment (BACTROBAN) 2 %    Sig: Apply to affected area 3 times daily    Dispense:  22 g    Refill:  0    Imaging: No new imaging  PMFS History: Patient Active Problem List   Diagnosis Date Noted   Left carpal tunnel syndrome 12/20/2019   History of adenomatous polyp of colon 10/21/2019   Class 1 obesity due to excess calories with body mass index (BMI) of 32.0 to 32.9 in adult    Acute hypoxemic respiratory failure ([facility]) 07/09/2019   Anemia    Acute on chronic respiratory failure ([place]) 07/08/2019   Acute respiratory failure with hypoxia ([place])  05/19/2019   Iron deficiency anemia 05/19/2019   Leukocytosis 05/19/2019   D-dimer, elevated 05/19/2019   Dextrocardia    Chewing tobacco nicotine dependence    Constipation 07/19/2018   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   Chronic respiratory failure with hypoxia ([place]) 07/16/2018   HTN (hypertension) 07/16/2018   Acute on chronic respiratory failure with hypoxia ([place]) 04/21/2018   COPD exacerbation ([place]) 04/21/2018   SVT (supraventricular tachycardia) ([place]) 04/14/2018   Paroxysmal SVT (supraventricular tachycardia) ([place]) 04/14/2018   Chronic diastolic CHF (congestive heart failure) ([place]) 04/14/2018   CHF exacerbation ([place]) 04/10/2018   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   Restrictive lung disease 11/08/2016   Chronic vasomotor rhinitis 11/08/2016   Moderate persistent asthma, uncomplicated 30/94/0768   Abdominal pain 08/81/1031   Gastritis, Helicobacter pylori 59/45/8592   Insulin-requiring or dependent type II diabetes mellitus ([place]) 01/03/2014   Malabsorption of iron 01/03/2014   GERD (gastroesophageal reflux disease) 10/18/2012   Nausea 10/18/2012   Iron deficiency anemia due to chronic blood loss 10/18/2012   HIP PAIN 08/31/2010   SPINAL STENOSIS 06/28/2010   TENDINITIS, CALCIFIC, SHOULDER, RIGHT 06/14/2010   IMPINGEMENT SYNDROME 06/14/2010   Past Medical History:  Diagnosis Date   Chronic diastolic heart failure ([facility])    COPD (chronic obstructive pulmonary  disease) ([place])    Depression    Dextrocardia    Diabetes mellitus    GERD (gastroesophageal reflux disease)    H. pylori infection 11/09/2012   treated with pylera   HLD (hyperlipidemia)    HTN (hypertension)    Cholesterol   Iron deficiency anemia, unspecified 10/18/2012   Neuropathy    Pneumonia    Situs inversus totalis    Tachycardia     Family History  Problem Relation Age of Onset   Heart attack Mother    Diabetes Sister        Fx     Heart defect Sister        Fx   Arthritis Sister        Fx   Asthma Sister        Fx   Kidney disease Sister        Fx   Pancreatitis Sister    Colon cancer Neg Hx     Past Surgical History:  Procedure Laterality Date   BIOPSY  01/09/2020   Procedure: BIOPSY;  Surgeon: Daneil Dolin, MD;  [facility];  Service: Endoscopy;;   CARPAL TUNNEL RELEASE Right 02/12/2020   Procedure: RIGHT CARPAL TUNNEL RELEASE;  Surgeon: Leandrew Koyanagi, MD;  Location: [facility];  Service: Orthopedics;  Laterality: Right;   CARPAL TUNNEL RELEASE Left 04/22/2020   Procedure: LEFT CARPAL TUNNEL RELEASE;  Surgeon: Leandrew Koyanagi, MD;  Location: [facility];  Service: Orthopedics;  Laterality: Left;   CATARACT EXTRACTION, BILATERAL  2016   COLONOSCOPY WITH ESOPHAGOGASTRODUODENOSCOPY (EGD) N/A 11/09/2012   Dr. 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 hpylori on bx- treated with pylera. TCS- normal rectum. 1 diminutive polyp in the mid descending segment. 1-73mm polyp in the mid desending segment o/w the remainder of the colonic mucosa appeared normal. tubular adenoma on bx   COLONOSCOPY WITH PROPOFOL N/A 01/09/2020   Procedure: COLONOSCOPY WITH PROPOFOL;  Surgeon: Daneil Dolin, MD;  Four 3-5 mm polyps in the hepatic flexure and cecum resected and retrieved, otherwise normal exam.  Pathology with tubular adenomas.  Recommended repeat colonoscopy in 3 years.   ESOPHAGOGASTRODUODENOSCOPY (EGD) WITH PROPOFOL N/A 01/09/2020   Procedure: ESOPHAGOGASTRODUODENOSCOPY (EGD) WITH PROPOFOL;  Surgeon: Daneil Dolin, MD; normal exam.  Negative for H. pylori    GIVENS CAPSULE STUDY N/A 10/07/2013   no source for anemia or heme positive stool noted   NECK SURGERY     bone spurs   POLYPECTOMY  01/09/2020   Procedure: POLYPECTOMY;  Surgeon: Daneil Dolin, MD;  [facility];   Service: Endoscopy;;   Social History   Occupational History   Occupation: landscaping   Tobacco Use   Smoking status: Never Smoker   Smokeless tobacco: Current User    Types: Chew  Vaping Use   Vaping Use: Never used  Substance and Sexual Activity   Alcohol use: No   Drug use: No   Sexual activity: Yes

## 2020-05-13 ENCOUNTER — Other Ambulatory Visit: Payer: Self-pay

## 2020-05-13 ENCOUNTER — Inpatient Hospital Stay (HOSPITAL_COMMUNITY): Payer: Medicare HMO | Attending: Hematology | Admitting: Nurse Practitioner

## 2020-05-13 DIAGNOSIS — Z794 Long term (current) use of insulin: Secondary | ICD-10-CM | POA: Diagnosis not present

## 2020-05-13 DIAGNOSIS — I10 Essential (primary) hypertension: Secondary | ICD-10-CM | POA: Diagnosis not present

## 2020-05-13 DIAGNOSIS — J449 Chronic obstructive pulmonary disease, unspecified: Secondary | ICD-10-CM | POA: Diagnosis not present

## 2020-05-13 DIAGNOSIS — E785 Hyperlipidemia, unspecified: Secondary | ICD-10-CM | POA: Diagnosis not present

## 2020-05-13 DIAGNOSIS — D72829 Elevated white blood cell count, unspecified: Secondary | ICD-10-CM | POA: Diagnosis not present

## 2020-05-13 DIAGNOSIS — K219 Gastro-esophageal reflux disease without esophagitis: Secondary | ICD-10-CM | POA: Diagnosis not present

## 2020-05-13 DIAGNOSIS — E119 Type 2 diabetes mellitus without complications: Secondary | ICD-10-CM | POA: Diagnosis not present

## 2020-05-13 DIAGNOSIS — R000 Tachycardia, unspecified: Secondary | ICD-10-CM | POA: Diagnosis not present

## 2020-05-13 DIAGNOSIS — G629 Polyneuropathy, unspecified: Secondary | ICD-10-CM | POA: Insufficient documentation

## 2020-05-13 DIAGNOSIS — Z79899 Other long term (current) drug therapy: Secondary | ICD-10-CM | POA: Insufficient documentation

## 2020-05-13 DIAGNOSIS — I509 Heart failure, unspecified: Secondary | ICD-10-CM | POA: Diagnosis not present

## 2020-05-13 DIAGNOSIS — D500 Iron deficiency anemia secondary to blood loss (chronic): Secondary | ICD-10-CM

## 2020-05-13 DIAGNOSIS — D509 Iron deficiency anemia, unspecified: Secondary | ICD-10-CM | POA: Insufficient documentation

## 2020-05-13 DIAGNOSIS — F329 Major depressive disorder, single episode, unspecified: Secondary | ICD-10-CM | POA: Insufficient documentation

## 2020-05-13 DIAGNOSIS — E871 Hypo-osmolality and hyponatremia: Secondary | ICD-10-CM | POA: Insufficient documentation

## 2020-05-13 DIAGNOSIS — Z7982 Long term (current) use of aspirin: Secondary | ICD-10-CM | POA: Insufficient documentation

## 2020-05-13 NOTE — Progress Notes (Signed)
[place], [place] [address]   CLINIC:  Medical Oncology/Hematology  PCP:  [name], [address] [place] [address] [phone]   REASON FOR VISIT: Follow-up for iron deficiency anemia   CURRENT THERAPY: Intermittent iron infusions   INTERVAL HISTORY:  Brett [name] 69 y.o. male returns for routine follow-up for iron deficiency anemia.  Patient reports Brett [name] is doing well since his last visit.  Brett [name] reports the iron infusions helped his fatigue tremendously.  Brett [name] denies any bright red bleeding per rectum or melena.  Denies any. Denies any nausea, vomiting, or diarrhea. Denies any new pains. Had not noticed any recent bleeding such as epistaxis, hematuria or hematochezia. Denies recent chest pain on exertion, shortness of breath on minimal exertion, pre-syncopal episodes, or palpitations. Denies any numbness or tingling in hands or feet. Denies any recent fevers, infections, or recent hospitalizations. Patient reports appetite at 100% and energy level at 100%.     REVIEW OF SYSTEMS:  Review of Systems  Neurological: Positive for numbness.  All other systems reviewed and are negative.    PAST MEDICAL/SURGICAL HISTORY:  Past Medical History:  Diagnosis Date  . Chronic diastolic heart failure ([place])   . COPD (chronic obstructive pulmonary disease) ([place])   . Depression   . Dextrocardia   . Diabetes mellitus   . GERD (gastroesophageal reflux disease)   . H. pylori infection 11/09/2012   treated with pylera  . HLD (hyperlipidemia)   . HTN (hypertension)    Cholesterol  . Iron deficiency anemia, unspecified 10/18/2012  . Neuropathy   . Pneumonia   . Situs inversus totalis   . Tachycardia    Past Surgical History:  Procedure Laterality Date  . BIOPSY  01/09/2020   Procedure: BIOPSY;  Surgeon: Daneil Dolin, MD;  [facility];  Service: Endoscopy;;  . CARPAL TUNNEL RELEASE Right 02/12/2020   Procedure: RIGHT  CARPAL TUNNEL RELEASE;  Surgeon: Leandrew Koyanagi, MD;  Location: [facility];  Service: Orthopedics;  Laterality: Right;  . CARPAL TUNNEL RELEASE Left 04/22/2020   Procedure: LEFT CARPAL TUNNEL RELEASE;  Surgeon: Leandrew Koyanagi, MD;  Location: [facility];  Service: Orthopedics;  Laterality: Left;  . CATARACT EXTRACTION, BILATERAL  2016  . COLONOSCOPY WITH ESOPHAGOGASTRODUODENOSCOPY (EGD) N/A 11/09/2012   Dr. 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 hpylori on bx- treated with pylera. TCS- normal rectum. 1 diminutive polyp in the mid descending segment. 1-30mm polyp in the mid desending segment o/w the remainder of the colonic mucosa appeared normal. tubular adenoma on bx  . COLONOSCOPY WITH PROPOFOL N/A 01/09/2020   Procedure: COLONOSCOPY WITH PROPOFOL;  Surgeon: Daneil Dolin, MD;  Four 3-5 mm polyps in the hepatic flexure and cecum resected and retrieved, otherwise normal exam.  Pathology with tubular adenomas.  Recommended repeat colonoscopy in 3 years.  . ESOPHAGOGASTRODUODENOSCOPY (EGD) WITH PROPOFOL N/A 01/09/2020   Procedure: ESOPHAGOGASTRODUODENOSCOPY (EGD) WITH PROPOFOL;  Surgeon: Daneil Dolin, MD; normal exam.  Negative for H. pylori   . GIVENS CAPSULE STUDY N/A 10/07/2013   no source for anemia or heme positive stool noted  . NECK SURGERY     bone spurs  . POLYPECTOMY  01/09/2020   Procedure: POLYPECTOMY;  Surgeon: Daneil Dolin, MD;  [facility];  Service: Endoscopy;;     SOCIAL HISTORY:  Social History   Socioeconomic History  .  Marital status: Married    Spouse name: Not on file  . Number of children: 0  . Years of education: 7th grade   . Highest education level: Not on file  Occupational History  . Occupation: Biomedical scientist   Tobacco Use  . Smoking status: Never Smoker  . Smokeless tobacco: Current User    Types: Chew  Vaping Use  . Vaping  Use: Never used  Substance and Sexual Activity  . Alcohol use: No  . Drug use: No  . Sexual activity: Yes  Other Topics Concern  . Not on file  Social History Narrative  . Not on file   Social Determinants of Health   Financial Resource Strain:   . Difficulty of Paying Living Expenses: Not on file  Food Insecurity:   . Worried About Charity fundraiser in the Last Year: Not on file  . Ran Out of Food in the Last Year: Not on file  Transportation Needs:   . Lack of Transportation (Medical): Not on file  . Lack of Transportation (Non-Medical): Not on file  Physical Activity:   . Days of Exercise per Week: Not on file  . Minutes of Exercise per Session: Not on file  Stress:   . Feeling of Stress : Not on file  Social Connections:   . Frequency of Communication with Friends and Family: Not on file  . Frequency of Social Gatherings with Friends and Family: Not on file  . Attends Religious Services: Not on file  . Active Member of Clubs or Organizations: Not on file  . Attends Archivist Meetings: Not on file  . Marital Status: Not on file  Intimate Partner Violence:   . Fear of Current or Ex-Partner: Not on file  . Emotionally Abused: Not on file  . Physically Abused: Not on file  . Sexually Abused: Not on file    FAMILY HISTORY:  Family History  Problem Relation Age of Onset  . Heart attack Mother   . Diabetes Sister        Fx  . Heart defect Sister        Fx  . Arthritis Sister        Fx  . Asthma Sister        Fx  . Kidney disease Sister        Fx  . Pancreatitis Sister   . Colon cancer Neg Hx     CURRENT MEDICATIONS:  Outpatient Encounter Medications as of 05/13/2020  Medication Sig  . albuterol (PROAIR HFA) 108 (90 Base) MCG/ACT inhaler Inhale 2 puffs into the lungs every 4 (four) hours as needed for wheezing or shortness of breath.  Marland Kitchen aspirin EC 81 MG tablet Take 81 mg by mouth daily.  . budesonide-formoterol (SYMBICORT) 160-4.5 MCG/ACT inhaler  Inhale 2 puffs into the lungs 2 (two) times daily.  . calcium-vitamin D (OSCAL WITH D) 500-200 MG-UNIT tablet Take 1 tablet by mouth.  . cetirizine (ZYRTEC) 10 MG tablet Take 10 mg by mouth daily.  . Cinnamon 500 MG capsule Take 2,000 mg by mouth daily.   Marland Kitchen co-enzyme Q-10 30 MG capsule Take 30 mg by mouth 3 (three) times daily.  . DULoxetine (CYMBALTA) 60 MG capsule Take 60 mg by mouth daily.  . fenofibrate (TRICOR) 145 MG tablet Take 145 mg by mouth every evening.   . ferrous sulfate 325 (65 FE) MG tablet Take 325-650 mg by mouth See admin instructions. Take two tablets on Monday Wednesday, Friday. And  1 tablet on all other days  . folic acid (FOLVITE) 1 MG tablet Take 1 mg by mouth daily.  . furosemide (LASIX) 40 MG tablet Take 1-1.5 tablets (40-60 mg total) by mouth See admin instructions. 60 mg in the  Morning,40 mg in the evening  . gabapentin (NEURONTIN) 600 MG tablet Take 1,200 mg by mouth 3 (three) times daily.  Marland Kitchen guaiFENesin (MUCINEX) 600 MG 12 hr tablet Take 600 mg by mouth 1 day or 1 dose.  Marland Kitchen HYDROcodone-acetaminophen (NORCO) 5-325 MG tablet Take 1-2 tablets by mouth 3 (three) times daily as needed.  Marland Kitchen HYDROcodone-acetaminophen (NORCO) 5-325 MG tablet Take 1-2 tablets by mouth 3 (three) times daily as needed.  . insulin detemir (LEVEMIR) 100 UNIT/ML injection Inject 60 Units into the skin at bedtime.   Marland Kitchen LINZESS 145 MCG CAPS capsule TAKE ONE CAPSULE BY MOUTH ONCE DAILY BEFORE BREAKFAST. (Patient taking differently: Take 145 mcg by mouth daily before breakfast. )  . losartan (COZAAR) 100 MG tablet Take 100 mg by mouth daily.  . metFORMIN (GLUCOPHAGE) 1000 MG tablet Take 1,000 mg by mouth 2 (two) times daily with a meal.  . metoprolol tartrate (LOPRESSOR) 25 MG tablet Take 1 tablet (25 mg total) by mouth 2 (two) times daily.  . Multiple Vitamin (MULTIVITAMIN) tablet Take 1 tablet by mouth daily.  . mupirocin ointment (BACTROBAN) 2 % Apply to affected area 3 times daily  . pantoprazole  (PROTONIX) 40 MG tablet Take 1 tablet (40 mg total) by mouth daily.  . pioglitazone (ACTOS) 45 MG tablet Take 45 mg by mouth daily.  Marland Kitchen rOPINIRole (REQUIP) 2 MG tablet Take 2 mg by mouth at bedtime.   . rosuvastatin (CRESTOR) 40 MG tablet Take 40 mg by mouth every evening.   . sitaGLIPtin (JANUVIA) 50 MG tablet Take 50 mg by mouth daily.   . traMADol (ULTRAM) 50 MG tablet Take 50-100 mg by mouth every 6 (six) hours as needed for moderate pain.   . traZODone (DESYREL) 150 MG tablet Take 150 mg by mouth at bedtime.  . ondansetron (ZOFRAN) 4 MG tablet Take 1-2 tablets (4-8 mg total) by mouth every 8 (eight) hours as needed for nausea or vomiting. (Patient not taking: Reported on 05/13/2020)  . [DISCONTINUED] traZODone (DESYREL) 50 MG tablet Take 150 mg by mouth at bedtime.    No facility-administered encounter medications on file as of 05/13/2020.    ALLERGIES:  No Known Allergies   PHYSICAL EXAM:  ECOG Performance status: 1  Vitals:   05/13/20 1059  BP: (!) 129/46  Pulse: 60  Resp: 18  Temp: (!) 97.1 F (36.2 C)  SpO2: 93%   Filed Weights   05/13/20 1059  Weight: 219 lb 11.2 oz (99.7 kg)   Physical Exam Constitutional:      Appearance: Normal appearance. Brett [name] is normal weight.  Cardiovascular:     Rate and Rhythm: Normal rate and regular rhythm.     Heart sounds: Normal heart sounds.  Pulmonary:     Effort: Pulmonary effort is normal.     Breath sounds: Normal breath sounds.  Abdominal:     General: Bowel sounds are normal.     Palpations: Abdomen is soft.  Musculoskeletal:        General: Normal range of motion.  Skin:    General: Skin is warm.  Neurological:     Mental Status: Brett [name] is alert and oriented to person, place, and time. Mental status is at baseline.  Psychiatric:  Mood and Affect: Mood normal.        Behavior: Behavior normal.        Thought Content: Thought content normal.        Judgment: Judgment normal.      LABORATORY DATA:  I have reviewed  the labs as listed.  CBC    Component Value Date/Time   WBC 8.3 05/06/2020 1123   RBC 3.87 (L) 05/06/2020 1123   HGB 11.7 (L) 05/06/2020 1123   HGB 12.2 (L) 01/03/2020 0948   HCT 36.3 (L) 05/06/2020 1123   HCT 37.6 01/03/2020 0948   PLT 362 05/06/2020 1123   PLT 434 01/03/2020 0948   MCV 93.8 05/06/2020 1123   MCV 91 01/03/2020 0948   MCH 30.2 05/06/2020 1123   MCHC 32.2 05/06/2020 1123   RDW 14.3 05/06/2020 1123   RDW 12.9 01/03/2020 0948   LYMPHSABS 1.4 05/06/2020 1123   LYMPHSABS 1.9 01/03/2020 0948   MONOABS 0.7 05/06/2020 1123   EOSABS 0.2 05/06/2020 1123   EOSABS 0.1 01/03/2020 0948   BASOSABS 0.0 05/06/2020 1123   BASOSABS 0.0 01/03/2020 0948   CMP Latest Ref Rng & Units 05/06/2020 04/20/2020 02/07/2020  Glucose 70 - 99 mg/dL 90 82 96  BUN 8 - 23 mg/dL 19 18 22   Creatinine 0.61 - 1.24 mg/dL 1.10 0.94 1.10  Sodium 135 - 145 mmol/L 134(L) 134(L) 134(L)  Potassium 3.5 - 5.1 mmol/L 4.1 5.0 4.7  Chloride 98 - 111 mmol/L 95(L) 95(L) 98  CO2 22 - 32 mmol/L 28 28 25   Calcium 8.9 - 10.3 mg/dL 9.6 9.7 9.7  Total Protein 6.5 - 8.1 g/dL 7.4 - -  Total Bilirubin 0.3 - 1.2 mg/dL 0.5 - -  Alkaline Phos 38 - 126 U/L 37(L) - -  AST 15 - 41 U/L 28 - -  ALT 0 - 44 U/L 21 - -    All questions were answered to patient's stated satisfaction. Encouraged patient to call with any new concerns or questions before his next visit to the cancer center and we can certain see him sooner, if needed.     ASSESSMENT & PLAN:  Iron deficiency anemia due to chronic blood loss 1.  Iron deficiency anemia: -Brett [name] was hospitalized in October 2020 when his hemoglobin dropped and had received Feraheme on 07/09/2019. -Colonoscopy on 01/09/2020 showed subcentimeter polyps in the hepatic flexure, cecum.  Rest of the exam was normal. -EGD on 01/09/2020 showed normal esophagus, stomach and duodenum. -Brett [name] last received Feraheme on 03/02/2020 and 03/09/2020 -Labs done on 05/06/2020 hemoglobin 11.7, ferritin 373, percent  saturation 17 -Brett [name] does not need any iron infusions at this time. -We will see him back in 3 months with repeat labs  2.  Leukocytosis and thrombocytosis: -Work-up for myeloproliferative disorders previously done was negative. -Labs done on 05/06/2020 showed WBC 8.3 and platelets 362  3.  Hyponatremia: -Labs done on 05/06/2020 showed sodium level 134. -Patient reports Brett [name] has been drinking 1 Gatorade a day and it has helped.     Orders placed this encounter:  Orders Placed This Encounter  Procedures  . CBC with Differential/Platelet  . Comprehensive metabolic panel  . Ferritin  . Iron and TIBC  . Lactate dehydrogenase  . Vitamin B12  . VITAMIN D 25 Hydroxy (Vit-D Deficiency, Fractures)      Berdine Addison, FNP-C [facility] [phone]

## 2020-05-13 NOTE — Assessment & Plan Note (Signed)
1.  Iron deficiency anemia: -He was hospitalized in October 2020 when his hemoglobin dropped and had received Feraheme on 07/09/2019. -Colonoscopy on 01/09/2020 showed subcentimeter polyps in the hepatic flexure, cecum.  Rest of the exam was normal. -EGD on 01/09/2020 showed normal esophagus, stomach and duodenum. -He last received Feraheme on 03/02/2020 and 03/09/2020 -Labs done on 05/06/2020 hemoglobin 11.7, ferritin 373, percent saturation 17 -He does not need any iron infusions at this time. -We will see him back in 3 months with repeat labs  2.  Leukocytosis and thrombocytosis: -Work-up for myeloproliferative disorders previously done was negative. -Labs done on 05/06/2020 showed WBC 8.3 and platelets 362  3.  Hyponatremia: -Labs done on 05/06/2020 showed sodium level 134. -Patient reports he has been drinking 1 Gatorade a day and it has helped.

## 2020-06-02 ENCOUNTER — Ambulatory Visit: Payer: Medicare HMO | Admitting: Orthopaedic Surgery

## 2020-06-15 ENCOUNTER — Other Ambulatory Visit (HOSPITAL_COMMUNITY): Payer: Self-pay | Admitting: *Deleted

## 2020-06-15 DIAGNOSIS — D500 Iron deficiency anemia secondary to blood loss (chronic): Secondary | ICD-10-CM

## 2020-06-15 NOTE — Progress Notes (Signed)
Patient called clinic stating that he was feeling weak, tired, no energy and wants his labs checked early to see if he needs iron infusion.  Patient scheduled and is aware of appt.

## 2020-06-17 ENCOUNTER — Other Ambulatory Visit: Payer: Self-pay

## 2020-06-17 ENCOUNTER — Inpatient Hospital Stay (HOSPITAL_COMMUNITY): Payer: Medicare HMO | Attending: Hematology

## 2020-06-17 DIAGNOSIS — E871 Hypo-osmolality and hyponatremia: Secondary | ICD-10-CM | POA: Insufficient documentation

## 2020-06-17 DIAGNOSIS — K219 Gastro-esophageal reflux disease without esophagitis: Secondary | ICD-10-CM | POA: Diagnosis not present

## 2020-06-17 DIAGNOSIS — D500 Iron deficiency anemia secondary to blood loss (chronic): Secondary | ICD-10-CM

## 2020-06-17 DIAGNOSIS — Z841 Family history of disorders of kidney and ureter: Secondary | ICD-10-CM | POA: Diagnosis not present

## 2020-06-17 DIAGNOSIS — Z8249 Family history of ischemic heart disease and other diseases of the circulatory system: Secondary | ICD-10-CM | POA: Insufficient documentation

## 2020-06-17 DIAGNOSIS — Z8379 Family history of other diseases of the digestive system: Secondary | ICD-10-CM | POA: Insufficient documentation

## 2020-06-17 DIAGNOSIS — D75839 Thrombocytosis, unspecified: Secondary | ICD-10-CM | POA: Insufficient documentation

## 2020-06-17 DIAGNOSIS — D72829 Elevated white blood cell count, unspecified: Secondary | ICD-10-CM | POA: Diagnosis not present

## 2020-06-17 DIAGNOSIS — Z79899 Other long term (current) drug therapy: Secondary | ICD-10-CM | POA: Diagnosis not present

## 2020-06-17 DIAGNOSIS — I10 Essential (primary) hypertension: Secondary | ICD-10-CM | POA: Diagnosis not present

## 2020-06-17 DIAGNOSIS — Z833 Family history of diabetes mellitus: Secondary | ICD-10-CM | POA: Diagnosis not present

## 2020-06-17 DIAGNOSIS — R200 Anesthesia of skin: Secondary | ICD-10-CM | POA: Insufficient documentation

## 2020-06-17 DIAGNOSIS — D509 Iron deficiency anemia, unspecified: Secondary | ICD-10-CM | POA: Diagnosis not present

## 2020-06-17 LAB — CBC WITH DIFFERENTIAL/PLATELET
Abs Immature Granulocytes: 0.05 10*3/uL (ref 0.00–0.07)
Basophils Absolute: 0 10*3/uL (ref 0.0–0.1)
Basophils Relative: 0 %
Eosinophils Absolute: 0.1 10*3/uL (ref 0.0–0.5)
Eosinophils Relative: 1 %
HCT: 36.8 % — ABNORMAL LOW (ref 39.0–52.0)
Hemoglobin: 11.8 g/dL — ABNORMAL LOW (ref 13.0–17.0)
Immature Granulocytes: 1 %
Lymphocytes Relative: 13 %
Lymphs Abs: 1.4 10*3/uL (ref 0.7–4.0)
MCH: 30.4 pg (ref 26.0–34.0)
MCHC: 32.1 g/dL (ref 30.0–36.0)
MCV: 94.8 fL (ref 80.0–100.0)
Monocytes Absolute: 0.7 10*3/uL (ref 0.1–1.0)
Monocytes Relative: 7 %
Neutro Abs: 8.5 10*3/uL — ABNORMAL HIGH (ref 1.7–7.7)
Neutrophils Relative %: 78 %
Platelets: 321 10*3/uL (ref 150–400)
RBC: 3.88 MIL/uL — ABNORMAL LOW (ref 4.22–5.81)
RDW: 13.2 % (ref 11.5–15.5)
WBC: 10.8 10*3/uL — ABNORMAL HIGH (ref 4.0–10.5)
nRBC: 0 % (ref 0.0–0.2)

## 2020-06-17 LAB — IRON AND TIBC
Iron: 70 ug/dL (ref 45–182)
Saturation Ratios: 16 % — ABNORMAL LOW (ref 17.9–39.5)
TIBC: 451 ug/dL — ABNORMAL HIGH (ref 250–450)
UIBC: 381 ug/dL

## 2020-06-17 LAB — FERRITIN: Ferritin: 33 ng/mL (ref 24–336)

## 2020-06-20 NOTE — Progress Notes (Signed)
Referring Provider: [name], MD Primary Care Physician:  [name], MD Primary GI Physician: Dr. Gala Romney  Chief Complaint  Patient presents with  . Anemia    scheduled for iron transfusion this week and next week    HPI:   Brett [name] is a 69 y.o. male with a history of Kartagener syndrome,diabetes, hypertension,COPDwith chronic respiratory failure on supplementalO2,diastolic CHF,GERD,H. Pylori s/p treatment with pylera, andiron deficiency anemiafollowing closely with hematology and receiving iron infusions as needed. Last received Feraheme on 03/02/2020 and 03/09/2020 when his hemoglobin had declined to 9.4 with low iron and %saturation but normal ferritin more consistent with anemia of chronic disease.  Most recent hemoglobin stable at 11.8 on 06/17/2020.  Extensive GI work-up for IDA in2014/2015 including EGD, TCS, and Givens capsulewithout explanation. Most recent EGD/TCS in April 2021 with 4 tubular adenomas, gastric biopsy with mild chronic inflammation, negative for H. pylori.  He is due for repeat colonoscopy in 2024.   He presents today for follow-up of GERD and constipation.  Last seen in our office 02/21/2020.  No overt GI bleeding.  Constipation well controlled on Linzess 145 mcg daily.  GERD well controlled on Protonix 40 mg daily.  Advised to continue his current medications and follow-up in 4 months.  Today:  GERD: Well controlled on Protonix 40 mg daily. No dysphagia. No nausea or vomiting.   No abdominal pain.   Constipation: Well controlled on Linzess 145 mcg daily. BMs daily.   Fluid status has been up recently. Was told to take an extra fluid pill.   Anemia: Has iron infusion this week and next week. No brbpr or melena. Doesn't want to have Givens capsule at the moment.  He does not want to do IFOBT  Past Medical History:  Diagnosis Date  . Chronic diastolic heart failure ([place])   . COPD (chronic obstructive pulmonary disease) ([place])   .  Depression   . Dextrocardia   . Diabetes mellitus   . GERD (gastroesophageal reflux disease)   . H. pylori infection 11/09/2012   treated with pylera; no H. pylori on EGD in April 2021  . HLD (hyperlipidemia)   . HTN (hypertension)    Cholesterol  . Iron deficiency anemia, unspecified 10/18/2012  . Neuropathy   . Pneumonia   . Situs inversus totalis   . Tachycardia     Past Surgical History:  Procedure Laterality Date  . BIOPSY  01/09/2020   Procedure: BIOPSY;  Surgeon: Daneil Dolin, MD;  [facility];  Service: Endoscopy;;  . CARPAL TUNNEL RELEASE Right 02/12/2020   Procedure: RIGHT CARPAL TUNNEL RELEASE;  Surgeon: Leandrew Koyanagi, MD;  Location: [facility];  Service: Orthopedics;  Laterality: Right;  . CARPAL TUNNEL RELEASE Left 04/22/2020   Procedure: LEFT CARPAL TUNNEL RELEASE;  Surgeon: Leandrew Koyanagi, MD;  Location: [facility];  Service: Orthopedics;  Laterality: Left;  . CATARACT EXTRACTION, BILATERAL  2016  . COLONOSCOPY WITH ESOPHAGOGASTRODUODENOSCOPY (EGD) N/A 11/09/2012   Dr. 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 hpylori on bx- treated with pylera. TCS- normal rectum. 1 diminutive polyp in the mid descending segment. 1-91mm polyp in the mid desending segment o/w the remainder of the colonic mucosa appeared normal. tubular adenoma on bx  . COLONOSCOPY WITH PROPOFOL N/A 01/09/2020   Procedure: COLONOSCOPY WITH PROPOFOL;  Surgeon: Daneil Dolin, MD;  Four 3-5 mm polyps in the hepatic flexure and cecum  resected and retrieved, otherwise normal exam.  Pathology with tubular adenomas.  Recommended repeat colonoscopy in 3 years.  . ESOPHAGOGASTRODUODENOSCOPY (EGD) WITH PROPOFOL N/A 01/09/2020   Procedure: ESOPHAGOGASTRODUODENOSCOPY (EGD) WITH PROPOFOL;  Surgeon: Daneil Dolin, MD; normal exam.  Negative for H. pylori   . GIVENS CAPSULE STUDY N/A 10/07/2013    no source for anemia or heme positive stool noted  . NECK SURGERY     bone spurs  . POLYPECTOMY  01/09/2020   Procedure: POLYPECTOMY;  Surgeon: Daneil Dolin, MD;  [facility];  Service: Endoscopy;;    Current Outpatient Medications  Medication Sig Dispense Refill  . albuterol (PROAIR HFA) 108 (90 Base) MCG/ACT inhaler Inhale 2 puffs into the lungs every 4 (four) hours as needed for wheezing or shortness of breath. 1 Inhaler 2  . aspirin EC 81 MG tablet Take 81 mg by mouth daily.    . budesonide-formoterol (SYMBICORT) 160-4.5 MCG/ACT inhaler Inhale 2 puffs into the lungs 2 (two) times daily. 1 Inhaler 5  . calcium-vitamin D (OSCAL WITH D) 500-200 MG-UNIT tablet Take 1 tablet by mouth.    . cetirizine (ZYRTEC) 10 MG tablet Take 10 mg by mouth daily.    . Cinnamon 500 MG capsule Take 2,000 mg by mouth daily.     Marland Kitchen co-enzyme Q-10 30 MG capsule Take 30 mg by mouth 3 (three) times daily.    . DULoxetine (CYMBALTA) 60 MG capsule Take 60 mg by mouth daily.    . fenofibrate (TRICOR) 145 MG tablet Take 145 mg by mouth every evening.     . ferrous sulfate 325 (65 FE) MG tablet Take 325-650 mg by mouth See admin instructions. Take two tablets on Monday Wednesday, Friday. And 1 tablet on all other days    . folic acid (FOLVITE) 1 MG tablet Take 1 mg by mouth daily.    . furosemide (LASIX) 40 MG tablet Take 1-1.5 tablets (40-60 mg total) by mouth See admin instructions. 60 mg in the  Morning,40 mg in the evening 225 tablet 1  . gabapentin (NEURONTIN) 600 MG tablet Take 1,200 mg by mouth 3 (three) times daily.    Marland Kitchen guaiFENesin (MUCINEX) 600 MG 12 hr tablet Take 600 mg by mouth 1 day or 1 dose.    Marland Kitchen HYDROcodone-acetaminophen (NORCO) 5-325 MG tablet Take 1-2 tablets by mouth 3 (three) times daily as needed. 30 tablet 0  . HYDROcodone-acetaminophen (NORCO) 5-325 MG tablet Take 1-2 tablets by mouth 3 (three) times daily as needed. 30 tablet 0  . insulin detemir (LEVEMIR) 100 UNIT/ML injection  Inject 60 Units into the skin at bedtime.     Marland Kitchen LINZESS 145 MCG CAPS capsule TAKE ONE CAPSULE BY MOUTH ONCE DAILY BEFORE BREAKFAST. (Patient taking differently: Take 145 mcg by mouth daily before breakfast. ) 90 capsule 3  . losartan (COZAAR) 100 MG tablet Take 100 mg by mouth daily.    . metFORMIN (GLUCOPHAGE) 1000 MG tablet Take 1,000 mg by mouth 2 (two) times daily with a meal.    . metoprolol tartrate (LOPRESSOR) 25 MG tablet Take 1 tablet (25 mg total) by mouth 2 (two) times daily. 180 tablet 3  . Multiple Vitamin (MULTIVITAMIN) tablet Take 1 tablet by mouth daily.    . mupirocin ointment (BACTROBAN) 2 % Apply to affected area 3 times daily 22 g 0  . ondansetron (ZOFRAN) 4 MG tablet Take 1-2 tablets (4-8 mg total) by mouth every 8 (eight) hours as needed for nausea or vomiting.  20 tablet 0  . pantoprazole (PROTONIX) 40 MG tablet Take 1 tablet (40 mg total) by mouth daily. 30 tablet 11  . pioglitazone (ACTOS) 45 MG tablet Take 45 mg by mouth daily.    Marland Kitchen rOPINIRole (REQUIP) 2 MG tablet Take 2 mg by mouth at bedtime.     . rosuvastatin (CRESTOR) 40 MG tablet Take 40 mg by mouth every evening.     . sitaGLIPtin (JANUVIA) 50 MG tablet Take 50 mg by mouth daily.     . traMADol (ULTRAM) 50 MG tablet Take 50-100 mg by mouth every 6 (six) hours as needed for moderate pain.     . traZODone (DESYREL) 150 MG tablet Take 150 mg by mouth at bedtime.     No current facility-administered medications for this visit.    Allergies as of 06/22/2020  . (No Known Allergies)    Family History  Problem Relation Age of Onset  . Heart attack Mother   . Diabetes Sister        Fx  . Heart defect Sister        Fx  . Arthritis Sister        Fx  . Asthma Sister        Fx  . Kidney disease Sister        Fx  . Pancreatitis Sister   . Colon cancer Neg Hx     Social History   Socioeconomic History  . Marital status: Married    Spouse name: Not on file  . Number of children: 0  . Years of education:  7th grade   . Highest education level: Not on file  Occupational History  . Occupation: Biomedical scientist   Tobacco Use  . Smoking status: Never Smoker  . Smokeless tobacco: Current User    Types: Chew  Vaping Use  . Vaping Use: Never used  Substance and Sexual Activity  . Alcohol use: No  . Drug use: No  . Sexual activity: Yes  Other Topics Concern  . Not on file  Social History Narrative  . Not on file   Social Determinants of Health   Financial Resource Strain:   . Difficulty of Paying Living Expenses: Not on file  Food Insecurity:   . Worried About Charity fundraiser in the Last Year: Not on file  . Ran Out of Food in the Last Year: Not on file  Transportation Needs:   . Lack of Transportation (Medical): Not on file  . Lack of Transportation (Non-Medical): Not on file  Physical Activity:   . Days of Exercise per Week: Not on file  . Minutes of Exercise per Session: Not on file  Stress:   . Feeling of Stress : Not on file  Social Connections:   . Frequency of Communication with Friends and Family: Not on file  . Frequency of Social Gatherings with Friends and Family: Not on file  . Attends Religious Services: Not on file  . Active Member of Clubs or Organizations: Not on file  . Attends Archivist Meetings: Not on file  . Marital Status: Not on file    Review of Systems: Gen: Denies fever, chills, cold or flulike symptoms, presyncope, syncope.  Admits to mild fatigue. CV: Denies chest pain or heart palpitations. Resp: Admits to chronic shortness of breath.  Overall, this is stable.  Requiring 1.5-2 L supplemental oxygen via nasal cannula.  Chronic cough. GI: See HPI. Heme: See HPI  Physical Exam: BP (!) 108/56  Pulse 63   Temp 97.9 F (36.6 C) (Oral)   Ht 5\' 7"  (1.702 m)   Wt 216 lb 3.2 oz (98.1 kg)   BMI 33.86 kg/m  General:  Alert and oriented. No distress noted. Pleasant and cooperative. Nasal Canula in place.  Head:  Normocephalic and  atraumatic. Eyes:  Conjuctiva clear without scleral icterus. Heart:  S1, S2 present without murmurs appreciated. Lungs:  Clear to auscultation bilaterally. No wheezes, rales, or rhonchi. No distress.  Abdomen:  +BS, soft, non-tender and non-distended. No rebound or guarding. No HSM or masses noted. Msk:  Symmetrical without gross deformities. Normal posture. Extremities:  Without edema. Neurologic:  Alert and  oriented x4 Psych:  Normal mood and affect.

## 2020-06-22 ENCOUNTER — Encounter: Payer: Self-pay | Admitting: Gastroenterology

## 2020-06-22 ENCOUNTER — Ambulatory Visit: Payer: Medicare HMO | Admitting: Gastroenterology

## 2020-06-22 ENCOUNTER — Other Ambulatory Visit: Payer: Self-pay

## 2020-06-22 VITALS — BP 108/56 | HR 63 | Temp 97.9°F | Ht 67.0 in | Wt 216.2 lb

## 2020-06-22 DIAGNOSIS — K219 Gastro-esophageal reflux disease without esophagitis: Secondary | ICD-10-CM

## 2020-06-22 DIAGNOSIS — K5900 Constipation, unspecified: Secondary | ICD-10-CM

## 2020-06-22 DIAGNOSIS — D649 Anemia, unspecified: Secondary | ICD-10-CM | POA: Diagnosis not present

## 2020-06-22 NOTE — Patient Instructions (Signed)
Continue taking Protonix 40 mg daily 30 minutes before breakfast.  Continue taking Linzess 145 mcg daily 30 minutes before your first meal.  Continue following with hematology for anemia.  If you notice any bright red blood per rectum or black stools, please let us know.  We will plan to see back in 6 months.  Do not hesitate to call if you have questions or concerns prior.  It was great seeing you today!  Aliene Altes, PA-C [facility]

## 2020-06-22 NOTE — Progress Notes (Signed)
Cc'ed to pcp °

## 2020-06-22 NOTE — Assessment & Plan Note (Signed)
Well-controlled on Protonix 40 mg daily.  No alarm symptoms.  Advised to continue his current medications and follow-up in 6 months. 

## 2020-06-22 NOTE — Assessment & Plan Note (Signed)
Well controlled on Linzess 145 mcg daily.  No alarm symptoms.  Advised to continue his current medications and follow-up in 6 months.

## 2020-06-22 NOTE — Assessment & Plan Note (Addendum)
Chronic history of IDA with significant GI work-up in 2014/2015 including EGD, TCS, and Givens capsule without explanation.  He has been following with hematology since.  During hospitalization October 2020, he had drop in hemoglobin to 8.3.  Iron panel with ferritin 182, iron 30, saturation 7%.  No overt GI bleeding.  He received Feraheme inpatient.  Outpatient TCS/EGD 01/09/2020 with 4 tubular adenomas, recommended repeat colonoscopy in 3 years.  EGD was entirely normal.  He continues to deny BRBPR or melena.  Per chart review, he had decline in hemoglobin to 9.4 with low iron and percent saturation but normal ferritin in June 2021 and received 2 IV iron infusions.  Most recent hemoglobin stable at 11.8, but ferritin trending down to 33, saturation 16% (L), iron 70.  He is again scheduled for 2 rounds of IV Feraheme.  Suspect anemia is likely secondary to chronic disease. As patient is requiring IV iron infusions more frequently, I discussed the possibility of IFOBT and/or Givens capsule to reevaluate his small bowel, but he declined.  Advised he continue following closely with hematology, and we will see him back in 6 months.

## 2020-06-24 ENCOUNTER — Encounter (HOSPITAL_COMMUNITY): Payer: Self-pay

## 2020-06-24 ENCOUNTER — Inpatient Hospital Stay (HOSPITAL_COMMUNITY): Payer: Medicare HMO

## 2020-06-24 ENCOUNTER — Other Ambulatory Visit: Payer: Self-pay

## 2020-06-24 VITALS — BP 116/43 | HR 59 | Temp 96.6°F | Resp 17

## 2020-06-24 DIAGNOSIS — D500 Iron deficiency anemia secondary to blood loss (chronic): Secondary | ICD-10-CM

## 2020-06-24 DIAGNOSIS — D509 Iron deficiency anemia, unspecified: Secondary | ICD-10-CM | POA: Diagnosis not present

## 2020-06-24 MED ORDER — SODIUM CHLORIDE 0.9 % IV SOLN: Freq: Once | INTRAVENOUS | Status: AC

## 2020-06-24 MED ORDER — SODIUM CHLORIDE 0.9 % IV SOLN
510.0000 mg | Freq: Once | INTRAVENOUS | Status: AC
  Administered 2020-06-24: 510 mg via INTRAVENOUS
  Filled 2020-06-24: qty 510

## 2020-06-24 NOTE — Progress Notes (Signed)
Tolerated iron infusion well without incidence today. Vital signs stable prior to discharge.  Discharged ambulatory in stable condition with cane.

## 2020-06-24 NOTE — Patient Instructions (Signed)
[facility]  Discharge Instructions:   _______________________________________________________________  Thank you for choosing [facility] at [facility] to provide your oncology and hematology care.  To afford each patient quality time with our providers, please arrive at least 15 minutes before your scheduled appointment.  You need to re-schedule your appointment if you arrive 10 or more minutes late.  We strive to give you quality time with our providers, and arriving late affects you and other patients whose appointments are after yours.  Also, if you no show three or more times for appointments you may be dismissed from the clinic.  Again, thank you for choosing [facility]. Our hope is that these requests will allow you access to exceptional care and in a timely manner. _______________________________________________________________  If you have questions after your visit, please contact our office at [phone] between the hours of 8:30 a.m. and 5:00 p.m. Voicemails left after 4:30 p.m. will not be returned until the following business day. _______________________________________________________________  For prescription refill requests, have your pharmacy contact our office. _______________________________________________________________  Recommendations made by the consultant and any test results will be sent to your referring physician. _______________________________________________________________ 

## 2020-07-01 ENCOUNTER — Inpatient Hospital Stay (HOSPITAL_COMMUNITY): Payer: Medicare HMO

## 2020-07-01 ENCOUNTER — Encounter (HOSPITAL_COMMUNITY): Payer: Self-pay

## 2020-07-01 VITALS — BP 128/43 | HR 62 | Temp 97.5°F | Resp 18

## 2020-07-01 DIAGNOSIS — D509 Iron deficiency anemia, unspecified: Secondary | ICD-10-CM | POA: Diagnosis not present

## 2020-07-01 DIAGNOSIS — D500 Iron deficiency anemia secondary to blood loss (chronic): Secondary | ICD-10-CM

## 2020-07-01 MED ORDER — SODIUM CHLORIDE 0.9 % IV SOLN
510.0000 mg | Freq: Once | INTRAVENOUS | Status: AC
  Administered 2020-07-01: 510 mg via INTRAVENOUS
  Filled 2020-07-01: qty 510

## 2020-07-01 MED ORDER — SODIUM CHLORIDE 0.9 % IV SOLN: Freq: Once | INTRAVENOUS | Status: AC

## 2020-07-01 NOTE — Progress Notes (Signed)
Tolerated iron infusion well today without incidence.  Discharged in stable condition ambulatory with cane.  Vital signs stable prior to discharge.

## 2020-07-01 NOTE — Patient Instructions (Signed)
[facility]  Discharge Instructions:   _______________________________________________________________  Thank you for choosing [facility] at [facility] to provide your oncology and hematology care.  To afford each patient quality time with our providers, please arrive at least 15 minutes before your scheduled appointment.  You need to re-schedule your appointment if you arrive 10 or more minutes late.  We strive to give you quality time with our providers, and arriving late affects you and other patients whose appointments are after yours.  Also, if you no show three or more times for appointments you may be dismissed from the clinic.  Again, thank you for choosing [facility] at [facility]. Our hope is that these requests will allow you access to exceptional care and in a timely manner. _______________________________________________________________  If you have questions after your visit, please contact our office at [phone] between the hours of 8:30 a.m. and 5:00 p.m. Voicemails left after 4:30 p.m. will not be returned until the following business day. _______________________________________________________________  For prescription refill requests, have your pharmacy contact our office. _______________________________________________________________  Recommendations made by the consultant and any test results will be sent to your referring physician. _______________________________________________________________ 

## 2020-07-13 ENCOUNTER — Other Ambulatory Visit: Payer: Self-pay | Admitting: Cardiology

## 2020-07-13 MED ORDER — METOPROLOL TARTRATE 25 MG PO TABS
25.0000 mg | ORAL_TABLET | Freq: Two times a day (BID) | ORAL | 0 refills | Status: DC
Start: 2020-07-13 — End: 2020-07-16

## 2020-07-13 NOTE — Telephone Encounter (Signed)
I refilled a week supply lopressor to [facility] as pt awaits his mail order delivery

## 2020-07-13 NOTE — Telephone Encounter (Signed)
° ° ° °  1. Which medications need to be refilled? (please list name of each medication and dose if known) metoprolol tartrate (LOPRESSOR) 25 MG    2. Which pharmacy/location (including street and city if local pharmacy) is medication to be sent to? [place]   3. Do they need a 30 day or 90 day supply?   Patient only nees a few to last until he receives from [facility]

## 2020-07-16 ENCOUNTER — Other Ambulatory Visit: Payer: Self-pay | Admitting: Cardiology

## 2020-07-23 ENCOUNTER — Telehealth: Payer: Self-pay | Admitting: Cardiology

## 2020-07-23 ENCOUNTER — Other Ambulatory Visit: Payer: Self-pay | Admitting: Cardiology

## 2020-07-23 MED ORDER — METOPROLOL TARTRATE 25 MG PO TABS: ORAL_TABLET | ORAL | 1 refills | Status: DC

## 2020-07-23 NOTE — Telephone Encounter (Signed)
Beth with [facility] called requesting that metoprolol tartrate (LOPRESSOR) 25 MG be sent to [facility] .  Please reach out to [facility] with [employer] to make certain that patient is suppose to continue this medication.

## 2020-07-23 NOTE — Telephone Encounter (Signed)
Returned call to [facility]. No answer. Unable to leave msg d/t mailbox being full. Refill sent in.

## 2020-07-23 NOTE — Telephone Encounter (Signed)
Patient called stating Metoprolol was sent to [facility] today but he is out.  They are wanting a refill sent to [facility].

## 2020-07-23 NOTE — Telephone Encounter (Signed)
Refilled metoprolol 

## 2020-08-05 ENCOUNTER — Other Ambulatory Visit: Payer: Self-pay | Admitting: Cardiology

## 2020-08-05 MED ORDER — FUROSEMIDE 40 MG PO TABS
40.0000 mg | ORAL_TABLET | ORAL | 2 refills | Status: DC
Start: 2020-08-05 — End: 2020-10-12

## 2020-08-05 NOTE — Telephone Encounter (Signed)
New message      *STAT* If patient is at the pharmacy, call can be transferred to refill team.   1. Which medications need to be refilled? (please list name of each medication and dose if known) furosemide (LASIX) 40 MG tablet  2. Which pharmacy/location (including street and city if local pharmacy) is medication to be sent to? [facility] order   3. Do they need a 30 day or 90 day supply? [place]

## 2020-08-11 ENCOUNTER — Other Ambulatory Visit: Payer: Self-pay

## 2020-08-11 ENCOUNTER — Ambulatory Visit: Payer: Medicare HMO | Admitting: Nurse Practitioner

## 2020-08-11 ENCOUNTER — Other Ambulatory Visit (HOSPITAL_COMMUNITY)
Admission: RE | Admit: 2020-08-11 | Discharge: 2020-08-11 | Disposition: A | Discharge status: Home / Self Care | Payer: Medicare HMO | Source: Ambulatory Visit | Attending: Nurse Practitioner | Admitting: Nurse Practitioner

## 2020-08-11 ENCOUNTER — Telehealth: Payer: Self-pay | Admitting: *Deleted

## 2020-08-11 ENCOUNTER — Encounter: Payer: Self-pay | Admitting: Nurse Practitioner

## 2020-08-11 VITALS — BP 144/66 | HR 100 | Temp 96.9°F | Ht 67.0 in | Wt 214.8 lb

## 2020-08-11 DIAGNOSIS — K219 Gastro-esophageal reflux disease without esophagitis: Secondary | ICD-10-CM

## 2020-08-11 DIAGNOSIS — Q893 Situs inversus: Secondary | ICD-10-CM

## 2020-08-11 DIAGNOSIS — K5900 Constipation, unspecified: Secondary | ICD-10-CM | POA: Diagnosis present

## 2020-08-11 DIAGNOSIS — R1030 Lower abdominal pain, unspecified: Secondary | ICD-10-CM

## 2020-08-11 LAB — CBC WITH DIFFERENTIAL/PLATELET
Abs Immature Granulocytes: 0.07 10*3/uL (ref 0.00–0.07)
Basophils Absolute: 0 10*3/uL (ref 0.0–0.1)
Basophils Relative: 0 %
Eosinophils Absolute: 0.1 10*3/uL (ref 0.0–0.5)
Eosinophils Relative: 1 %
HCT: 39.5 % (ref 39.0–52.0)
Hemoglobin: 12.7 g/dL — ABNORMAL LOW (ref 13.0–17.0)
Immature Granulocytes: 1 %
Lymphocytes Relative: 23 %
Lymphs Abs: 2.3 10*3/uL (ref 0.7–4.0)
MCH: 30.8 pg (ref 26.0–34.0)
MCHC: 32.2 g/dL (ref 30.0–36.0)
MCV: 95.6 fL (ref 80.0–100.0)
Monocytes Absolute: 0.9 10*3/uL (ref 0.1–1.0)
Monocytes Relative: 9 %
Neutro Abs: 6.8 10*3/uL (ref 1.7–7.7)
Neutrophils Relative %: 66 %
Platelets: 349 10*3/uL (ref 150–400)
RBC: 4.13 MIL/uL — ABNORMAL LOW (ref 4.22–5.81)
RDW: 13.2 % (ref 11.5–15.5)
WBC: 10.3 10*3/uL (ref 4.0–10.5)
nRBC: 0 % (ref 0.0–0.2)

## 2020-08-11 LAB — COMPREHENSIVE METABOLIC PANEL
ALT: 22 U/L (ref 0–44)
AST: 28 U/L (ref 15–41)
Albumin: 4.4 g/dL (ref 3.5–5.0)
Alkaline Phosphatase: 38 U/L (ref 38–126)
Anion gap: 9 (ref 5–15)
BUN: 17 mg/dL (ref 8–23)
CO2: 28 mmol/L (ref 22–32)
Calcium: 9.6 mg/dL (ref 8.9–10.3)
Chloride: 98 mmol/L (ref 98–111)
Creatinine, Ser: 1.22 mg/dL (ref 0.61–1.24)
GFR, Estimated: >60 mL/min (ref >60)
Glucose, Bld: 71 mg/dL (ref 70–99)
Potassium: 4 mmol/L (ref 3.5–5.1)
Sodium: 135 mmol/L (ref 135–145)
Total Bilirubin: 0.5 mg/dL (ref 0.3–1.2)
Total Protein: 7.5 g/dL (ref 6.5–8.1)

## 2020-08-11 LAB — LIPASE, BLOOD: Lipase: 24 U/L (ref 11–51)

## 2020-08-11 NOTE — Progress Notes (Signed)
Referring Provider: [name], MD Primary Care Physician:  [name], MD Primary GI:  Dr. Gala Romney  Chief Complaint  Patient presents with  . Abdominal Pain    stomach pain x 1 wk    HPI:   Brett [name] is a 69 y.o. male who presents for abdominal pain.  Patient was last seen in our office 06/22/2020 for GERD, anemia, constipation.  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 pylori status post treatment with Pylera, IDA following closely with hematology and iron infusions as needed.  He did have some anemia with a hemoglobin down to 9.4 with low iron but more consistent with anemia of chronic disease.  Extensive GI work-up including EGD, colonoscopy, Givens without explanation.  April 2021 colonoscopy found for tubular adenomas, EGD at that time with gastric biopsy with mild chronic inflammation and due for repeat colonoscopy in 2024.  At last visit he noted his GERD is well controlled on Protonix, no abdominal pain.  Constipation well controlled on Linzess 145 mcg daily with daily bowel movements.  Had an iron infusion the week of his last visit, does not want to complete iFOBT or repeat Givens at that time.  Recommended continue Protonix 40 mg daily, Linzess 145 mcg daily, follow-up with hematology as recommended, follow-up in our office in 6 months or sooner if needed.  Most recent labs completed 06/17/2020 with ferritin normal at 33, iron saturation is mildly low at 16.  CBC with stable hemoglobin at 11.8 that is normocytic and normochromic.  No recent abdominal imaging in our system.  Today he states he doing okay overall.  He began having abdominal pain about 1 week ago. Pain is in the mid to lower abdomen. Feels like it's improving, took Pepto Bismol which seems to have helped. GERD well managed. Having good bowel movement, though loose since abdominal pain started. Described as aching pain. Has known situs inversus.  Has nausea but not vomiting. Think she may have had a fever 1-2 days ago, but not completely sure. Denies hematochezia. Did have dark stools after Pepto Bismol, though not black. Denies vomiting. Denies URI or flu-like symptoms. Denies loss of sense of taste or smell. The patient has received COVID-19 vaccination(s). Denies chest pain, dyspnea, dizziness, lightheadedness, syncope, near syncope. Denies any other upper or lower GI symptoms.  Pain yesterday was 7-9/10 yesterday, seems somewhat better today.  Past Medical History:  Diagnosis Date  . Chronic diastolic heart failure ([place])   . COPD (chronic obstructive pulmonary disease) ([place])   . Depression   . Dextrocardia   . Diabetes mellitus   . GERD (gastroesophageal reflux disease)   . H. pylori infection 11/09/2012   treated with pylera; no H. pylori on EGD in April 2021  . HLD (hyperlipidemia)   . HTN (hypertension)    Cholesterol  . Iron deficiency anemia, unspecified 10/18/2012  . Neuropathy   . Pneumonia   . Situs inversus totalis   . Tachycardia     Past Surgical History:  Procedure Laterality Date  . BIOPSY  01/09/2020   Procedure: BIOPSY;  Surgeon: Daneil Dolin, MD;  [facility];  Service: Endoscopy;;  . CARPAL TUNNEL RELEASE Right 02/12/2020   Procedure: RIGHT CARPAL TUNNEL RELEASE;  Surgeon: Leandrew Koyanagi, MD;  Location: [place];  Service: Orthopedics;  Laterality: Right;  . CARPAL TUNNEL RELEASE Left 04/22/2020   Procedure: LEFT CARPAL TUNNEL RELEASE;  Surgeon: Erlinda Hong,  Marylynn Pearson, MD;  Location: [place];  Service: Orthopedics;  Laterality: Left;  . CATARACT EXTRACTION, BILATERAL  2016  . COLONOSCOPY WITH ESOPHAGOGASTRODUODENOSCOPY (EGD) N/A 11/09/2012   Dr. 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 hpylori on bx- treated with pylera. TCS- normal rectum. 1 diminutive polyp in the mid descending  segment. 1-55mm polyp in the mid desending segment o/w the remainder of the colonic mucosa appeared normal. tubular adenoma on bx  . COLONOSCOPY WITH PROPOFOL N/A 01/09/2020   Procedure: COLONOSCOPY WITH PROPOFOL;  Surgeon: Daneil Dolin, MD;  Four 3-5 mm polyps in the hepatic flexure and cecum resected and retrieved, otherwise normal exam.  Pathology with tubular adenomas.  Recommended repeat colonoscopy in 3 years.  . ESOPHAGOGASTRODUODENOSCOPY (EGD) WITH PROPOFOL N/A 01/09/2020   Procedure: ESOPHAGOGASTRODUODENOSCOPY (EGD) WITH PROPOFOL;  Surgeon: Daneil Dolin, MD; normal exam.  Negative for H. pylori   . GIVENS CAPSULE STUDY N/A 10/07/2013   no source for anemia or heme positive stool noted  . NECK SURGERY     bone spurs  . POLYPECTOMY  01/09/2020   Procedure: POLYPECTOMY;  Surgeon: Daneil Dolin, MD;  [facility];  Service: Endoscopy;;    Current Outpatient Medications  Medication Sig Dispense Refill  . albuterol (PROAIR HFA) 108 (90 Base) MCG/ACT inhaler Inhale 2 puffs into the lungs every 4 (four) hours as needed for wheezing or shortness of breath. (Patient taking differently: Inhale 2 puffs into the lungs as needed for wheezing or shortness of breath. ) 1 Inhaler 2  . aspirin EC 81 MG tablet Take 81 mg by mouth daily.    Marland Kitchen bismuth subsalicylate (PEPTO-BISMOL) 262 MG/15ML suspension Take 30 mLs by mouth as needed.    . budesonide-formoterol (SYMBICORT) 160-4.5 MCG/ACT inhaler Inhale 2 puffs into the lungs 2 (two) times daily. 1 Inhaler 5  . calcium-vitamin D (OSCAL WITH D) 500-200 MG-UNIT tablet Take 1 tablet by mouth.    . cetirizine (ZYRTEC) 10 MG tablet Take 10 mg by mouth daily.    . Cinnamon 500 MG capsule Take 2,000 mg by mouth daily.     Marland Kitchen co-enzyme Q-10 30 MG capsule Take 30 mg by mouth 3 (three) times daily.    . DULoxetine (CYMBALTA) 60 MG capsule Take 60 mg by mouth daily.    . fenofibrate (TRICOR) 145 MG tablet Take 145 mg by mouth every evening.     .  ferrous sulfate 325 (65 FE) MG tablet Take 325-650 mg by mouth See admin instructions. Take two tablets on Monday Wednesday, Friday. And 1 tablet on all other days    . folic acid (FOLVITE) 1 MG tablet Take 1 mg by mouth daily.    . furosemide (LASIX) 40 MG tablet Take 1-1.5 tablets (40-60 mg total) by mouth as directed. 60 mg in the  Morning,40 mg in the evening 225 tablet 2  . gabapentin (NEURONTIN) 600 MG tablet Take 1,200 mg by mouth 3 (three) times daily.    Marland Kitchen guaiFENesin (MUCINEX) 600 MG 12 hr tablet Take 600 mg by mouth 1 day or 1 dose.    Marland Kitchen HYDROcodone-acetaminophen (NORCO) 5-325 MG tablet Take 1-2 tablets by mouth 3 (three) times daily as needed. 30 tablet 0  . insulin detemir (LEVEMIR) 100 UNIT/ML injection Inject 30 Units into the skin at bedtime.     Marland Kitchen LINZESS 145 MCG CAPS capsule TAKE ONE CAPSULE BY MOUTH ONCE DAILY BEFORE BREAKFAST. (Patient taking  differently: Take 145 mcg by mouth daily before breakfast. ) 90 capsule 3  . losartan (COZAAR) 100 MG tablet Take 100 mg by mouth daily.    . metFORMIN (GLUCOPHAGE) 1000 MG tablet Take 1,000 mg by mouth 2 (two) times daily with a meal.    . metoprolol tartrate (LOPRESSOR) 25 MG tablet TAKE (1) TABLET BY MOUTH TWICE DAILY. 180 tablet 1  . Multiple Vitamin (MULTIVITAMIN) tablet Take 1 tablet by mouth daily.    . mupirocin ointment (BACTROBAN) 2 % Apply to affected area 3 times daily 22 g 0  . ondansetron (ZOFRAN) 4 MG tablet Take 1-2 tablets (4-8 mg total) by mouth every 8 (eight) hours as needed for nausea or vomiting. (Patient taking differently: Take 4-8 mg by mouth as needed for nausea or vomiting. ) 20 tablet 0  . pantoprazole (PROTONIX) 40 MG tablet Take 1 tablet (40 mg total) by mouth daily. 30 tablet 11  . pioglitazone (ACTOS) 45 MG tablet Take 45 mg by mouth daily.    Marland Kitchen rOPINIRole (REQUIP) 2 MG tablet Take 2 mg by mouth at bedtime.     . rosuvastatin (CRESTOR) 40 MG tablet Take 40 mg by mouth every evening.     . sitaGLIPtin  (JANUVIA) 50 MG tablet Take 50 mg by mouth daily.     . traMADol (ULTRAM) 50 MG tablet Take 50-100 mg by mouth every 6 (six) hours as needed for moderate pain.     . traZODone (DESYREL) 150 MG tablet Take 150 mg by mouth at bedtime.     No current facility-administered medications for this visit.    Allergies as of 08/11/2020  . (No Known Allergies)    Family History  Problem Relation Age of Onset  . Heart attack Mother   . Diabetes Sister        Fx  . Heart defect Sister        Fx  . Arthritis Sister        Fx  . Asthma Sister        Fx  . Kidney disease Sister        Fx  . Pancreatitis Sister   . Colon cancer Neg Hx     Social History   Socioeconomic History  . Marital status: Married    Spouse name: Not on file  . Number of children: 0  . Years of education: 7th grade   . Highest education level: Not on file  Occupational History  . Occupation: Biomedical scientist   Tobacco Use  . Smoking status: Never Smoker  . Smokeless tobacco: Current User    Types: Chew  Vaping Use  . Vaping Use: Never used  Substance and Sexual Activity  . Alcohol use: No  . Drug use: No  . Sexual activity: Yes  Other Topics Concern  . Not on file  Social History Narrative  . Not on file   Social Determinants of Health   Financial Resource Strain:   . Difficulty of Paying Living Expenses: Not on file  Food Insecurity:   . Worried About Charity fundraiser in the Last Year: Not on file  . Ran Out of Food in the Last Year: Not on file  Transportation Needs:   . Lack of Transportation (Medical): Not on file  . Lack of Transportation (Non-Medical): Not on file  Physical Activity:   . Days of Exercise per Week: Not on file  . Minutes of Exercise per Session: Not on file  Stress:   .  Feeling of Stress : Not on file  Social Connections:   . Frequency of Communication with Friends and Family: Not on file  . Frequency of Social Gatherings with Friends and Family: Not on file  . Attends  Religious Services: Not on file  . Active Member of Clubs or Organizations: Not on file  . Attends Archivist Meetings: Not on file  . Marital Status: Not on file    Subjective: Review of Systems  Constitutional: Negative for chills, fever, malaise/fatigue and weight loss.  HENT: Negative for congestion and sore throat.   Respiratory: Negative for cough and shortness of breath.   Cardiovascular: Negative for chest pain and palpitations.  Gastrointestinal: Positive for abdominal pain, diarrhea and nausea. Negative for blood in stool, constipation, heartburn, melena and vomiting.  Musculoskeletal: Negative for joint pain and myalgias.  Skin: Negative for rash.  Neurological: Negative for dizziness and weakness.  Endo/Heme/Allergies: Does not bruise/bleed easily.  Psychiatric/Behavioral: Negative for depression. The patient is not nervous/anxious.   All other systems reviewed and are negative.    Objective: BP (!) 144/66   Pulse 100   Temp (!) 96.9 F (36.1 C) (Temporal)   Ht 5\' 7"  (1.702 m)   Wt 214 lb 12.8 oz (97.4 kg)   BMI 33.64 kg/m  Physical Exam Vitals and nursing note reviewed.  Constitutional:      General: He is not in acute distress.    Appearance: Normal appearance. He is well-developed. He is obese. He is not ill-appearing, toxic-appearing or diaphoretic.  HENT:     Head: Normocephalic and atraumatic.     Nose: No congestion or rhinorrhea.  Eyes:     General: No scleral icterus. Cardiovascular:     Rate and Rhythm: Normal rate and regular rhythm.     Heart sounds: Normal heart sounds.  Pulmonary:     Effort: Pulmonary effort is normal.     Breath sounds: Normal breath sounds.  Abdominal:     General: Bowel sounds are normal. There is no distension.     Palpations: Abdomen is soft. There is no hepatomegaly, splenomegaly or mass.     Tenderness: There is abdominal tenderness in the right lower quadrant, suprapubic area and left lower quadrant. There  is no guarding or rebound.     Hernia: No hernia is present.  Musculoskeletal:     Cervical back: Neck supple.  Skin:    General: Skin is warm and dry.     Coloration: Skin is not jaundiced.     Findings: No bruising or rash.  Neurological:     General: No focal deficit present.     Mental Status: He is alert and oriented to person, place, and time. Mental status is at baseline.  Psychiatric:        Mood and Affect: Mood normal.        Behavior: Behavior normal.        Thought Content: Thought content normal.      Assessment:  Very pleasant 69 year old male with a history of situs inversus who recently underwent colonoscopy in April of this year which found precancerous polyps but no other concerning findings.  No mention of diverticular disease.  He has a history of chronic GERD and constipation which is generally doing quite well on PPI and Linzess.  He presents today for 1 week history of abdominal pain which was quite severe over the past several days, seems to be somewhat better today after taking Pepto-Bismol.  Abdominal pain:  Pain seems to be located across the entire lower abdomen, described as dull and aching.  It was previously 9 out of 10, today seems to be somewhat better.  He did have associated nausea but no vomiting.  Also associated somewhat looser stools with a history of constipation.  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  He has been having bowel movements.  Because of this I doubt obstruction.  Etiologies include acute bout with gastroenteritis, previously unknown diverticular disease/diverticulitis, less likely partial small bowel obstruction, other concerning findings related to his history of situs inversus.  Additionally he has felt somewhat febrile subjectively, but no objective temperature measurement.  He is afebrile today 96.9.  GERD: Currently well managed on PPI and generally not felt to be contributory to his current main complaint.   Constipation: Currently well  managed on Linzess 145 mcg daily and not likely contributory to his current main complaint. Interestingly he has had some softer/looser stools which also would be consistent with an acute gastroenteritis (1 with his nausea).   Plan: 1. Labs: CBC, CMP, lipase 2. CT of the abdomen and pelvis with contrast (baseline creatinine around 0.9-1.0.) 3. ER precautions given 4. Call for any worsening or severe symptoms 5. Otherwise follow-up in 6 weeks    Thank you for allowing [place] to participate in the care of Pence, DNP, AGNP-C Adult & Gerontological Nurse Practitioner [facility]   08/11/2020 3:06 PM   Disclaimer: This note was dictated with voice recognition software. Similar sounding words can inadvertently be transcribed and may not be corrected upon review.

## 2020-08-11 NOTE — Patient Instructions (Signed)
Your health issues we discussed today were:   Abdominal pain: 1. I am sorry your abdomen has been hurting! 2. Have your labs completed today when you leave our office 3. So we will call you to schedule your CT scan of your belly to check for complications or concerning findings 4. Call us for any worsening symptoms 5. If you have severe symptoms and are unable to reach [place] then proceed to the emergency room  GERD: 1. Glad your symptoms are doing well 2. Continue your current medications and call us for any worsening symptoms  Constipation: 1. Glad your symptoms are doing well on Linzess 2. Continue taking Linzess and call us for any worsening symptoms  Overall I recommend:  1. Continue your other current medications 2. Return for follow-up in 6 weeks 3. Call us for any questions or concerns   ---------------------------------------------------------------  I am glad you have gotten your COVID-19 vaccination!  Even though you are fully vaccinated you should continue to follow CDC and state/local guidelines.  ---------------------------------------------------------------   At [facility] we value your feedback. You may receive a survey about your visit today. Please share your experience as we strive to create trusting relationships with our patients to provide genuine, compassionate, quality care.  We appreciate your understanding and patience as we review any laboratory studies, imaging, and other diagnostic tests that are ordered as we care for you. Our office policy is 5 business days for review of these results, and any emergent or urgent results are addressed in a timely manner for your best interest. If you do not hear from our office in 1 week, please contact us.   We also encourage the use of MyChart, which contains your medical information for your review as well. If you are not enrolled in this feature, an access code is on this after visit summary for your  convenience. Thank you for allowing [place] to be involved in your care.  It was great to see you today!  I hope you have a Merry Christmas and Happy Holidays!!

## 2020-08-11 NOTE — Telephone Encounter (Signed)
PA approved via [employer]. Auth# [id number] DOS 08/12/20-09/11/2020  Called pt and spoke with Brett [name]. She is aware CT scheduled for 12/1 at 12:00pm, arrival 11:45am, npo 4 hrs prior. Advised pt will need to pick up contrast today or in the AM before 9:00am. She voiced understanding.

## 2020-08-12 ENCOUNTER — Ambulatory Visit (HOSPITAL_COMMUNITY)
Admission: RE | Admit: 2020-08-12 | Discharge: 2020-08-12 | Disposition: A | Discharge status: Home / Self Care | Payer: Medicare HMO | Source: Ambulatory Visit | Attending: Nurse Practitioner | Admitting: Nurse Practitioner

## 2020-08-12 DIAGNOSIS — Q893 Situs inversus: Secondary | ICD-10-CM | POA: Insufficient documentation

## 2020-08-12 DIAGNOSIS — R1030 Lower abdominal pain, unspecified: Secondary | ICD-10-CM | POA: Insufficient documentation

## 2020-08-12 DIAGNOSIS — K219 Gastro-esophageal reflux disease without esophagitis: Secondary | ICD-10-CM | POA: Insufficient documentation

## 2020-08-12 DIAGNOSIS — K5900 Constipation, unspecified: Secondary | ICD-10-CM | POA: Insufficient documentation

## 2020-08-12 MED ORDER — IOHEXOL 300 MG/ML  SOLN
100.0000 mL | Freq: Once | INTRAMUSCULAR | Status: AC | PRN
  Administered 2020-08-12: 100 mL via INTRAVENOUS

## 2020-08-12 NOTE — Progress Notes (Signed)
Cc'ed to pcp °

## 2020-08-17 ENCOUNTER — Other Ambulatory Visit (HOSPITAL_COMMUNITY): Payer: Medicare HMO

## 2020-08-24 ENCOUNTER — Ambulatory Visit (HOSPITAL_COMMUNITY): Payer: Medicare HMO | Admitting: Hematology

## 2020-09-01 ENCOUNTER — Inpatient Hospital Stay (HOSPITAL_COMMUNITY): Payer: Medicare HMO | Attending: Hematology and Oncology

## 2020-09-01 ENCOUNTER — Other Ambulatory Visit: Payer: Self-pay

## 2020-09-01 DIAGNOSIS — D509 Iron deficiency anemia, unspecified: Secondary | ICD-10-CM | POA: Insufficient documentation

## 2020-09-01 DIAGNOSIS — E119 Type 2 diabetes mellitus without complications: Secondary | ICD-10-CM | POA: Diagnosis not present

## 2020-09-01 DIAGNOSIS — R531 Weakness: Secondary | ICD-10-CM | POA: Insufficient documentation

## 2020-09-01 DIAGNOSIS — F329 Major depressive disorder, single episode, unspecified: Secondary | ICD-10-CM | POA: Diagnosis not present

## 2020-09-01 DIAGNOSIS — D72829 Elevated white blood cell count, unspecified: Secondary | ICD-10-CM | POA: Insufficient documentation

## 2020-09-01 DIAGNOSIS — J449 Chronic obstructive pulmonary disease, unspecified: Secondary | ICD-10-CM | POA: Insufficient documentation

## 2020-09-01 DIAGNOSIS — D500 Iron deficiency anemia secondary to blood loss (chronic): Secondary | ICD-10-CM

## 2020-09-01 DIAGNOSIS — E785 Hyperlipidemia, unspecified: Secondary | ICD-10-CM | POA: Insufficient documentation

## 2020-09-01 DIAGNOSIS — Q893 Situs inversus: Secondary | ICD-10-CM | POA: Insufficient documentation

## 2020-09-01 DIAGNOSIS — Z79899 Other long term (current) drug therapy: Secondary | ICD-10-CM | POA: Insufficient documentation

## 2020-09-01 DIAGNOSIS — G629 Polyneuropathy, unspecified: Secondary | ICD-10-CM | POA: Insufficient documentation

## 2020-09-01 DIAGNOSIS — D75839 Thrombocytosis, unspecified: Secondary | ICD-10-CM | POA: Insufficient documentation

## 2020-09-01 DIAGNOSIS — Z7982 Long term (current) use of aspirin: Secondary | ICD-10-CM | POA: Diagnosis not present

## 2020-09-01 DIAGNOSIS — I5032 Chronic diastolic (congestive) heart failure: Secondary | ICD-10-CM | POA: Insufficient documentation

## 2020-09-01 DIAGNOSIS — I110 Hypertensive heart disease with heart failure: Secondary | ICD-10-CM | POA: Insufficient documentation

## 2020-09-01 DIAGNOSIS — Z794 Long term (current) use of insulin: Secondary | ICD-10-CM | POA: Diagnosis not present

## 2020-09-01 DIAGNOSIS — K219 Gastro-esophageal reflux disease without esophagitis: Secondary | ICD-10-CM | POA: Insufficient documentation

## 2020-09-01 LAB — IRON AND TIBC
Iron: 54 ug/dL (ref 45–182)
Saturation Ratios: 10 % — ABNORMAL LOW (ref 17.9–39.5)
TIBC: 530 ug/dL — ABNORMAL HIGH (ref 250–450)
UIBC: 476 ug/dL

## 2020-09-01 LAB — COMPREHENSIVE METABOLIC PANEL
ALT: 18 U/L (ref 0–44)
AST: 24 U/L (ref 15–41)
Albumin: 4 g/dL (ref 3.5–5.0)
Alkaline Phosphatase: 32 U/L — ABNORMAL LOW (ref 38–126)
Anion gap: 9 (ref 5–15)
BUN: 17 mg/dL (ref 8–23)
CO2: 29 mmol/L (ref 22–32)
Calcium: 9.2 mg/dL (ref 8.9–10.3)
Chloride: 96 mmol/L — ABNORMAL LOW (ref 98–111)
Creatinine, Ser: 1.15 mg/dL (ref 0.61–1.24)
GFR, Estimated: >60 mL/min (ref >60)
Glucose, Bld: 112 mg/dL — ABNORMAL HIGH (ref 70–99)
Potassium: 4.6 mmol/L (ref 3.5–5.1)
Sodium: 134 mmol/L — ABNORMAL LOW (ref 135–145)
Total Bilirubin: 0.4 mg/dL (ref 0.3–1.2)
Total Protein: 7.2 g/dL (ref 6.5–8.1)

## 2020-09-01 LAB — CBC WITH DIFFERENTIAL/PLATELET
Abs Immature Granulocytes: 0.03 10*3/uL (ref 0.00–0.07)
Basophils Absolute: 0 10*3/uL (ref 0.0–0.1)
Basophils Relative: 0 %
Eosinophils Absolute: 0.1 10*3/uL (ref 0.0–0.5)
Eosinophils Relative: 1 %
HCT: 34.1 % — ABNORMAL LOW (ref 39.0–52.0)
Hemoglobin: 10.6 g/dL — ABNORMAL LOW (ref 13.0–17.0)
Immature Granulocytes: 0 %
Lymphocytes Relative: 21 %
Lymphs Abs: 2.1 10*3/uL (ref 0.7–4.0)
MCH: 30.8 pg (ref 26.0–34.0)
MCHC: 31.1 g/dL (ref 30.0–36.0)
MCV: 99.1 fL (ref 80.0–100.0)
Monocytes Absolute: 1 10*3/uL (ref 0.1–1.0)
Monocytes Relative: 10 %
Neutro Abs: 6.6 10*3/uL (ref 1.7–7.7)
Neutrophils Relative %: 68 %
Platelets: 308 10*3/uL (ref 150–400)
RBC: 3.44 MIL/uL — ABNORMAL LOW (ref 4.22–5.81)
RDW: 13.2 % (ref 11.5–15.5)
WBC: 9.7 10*3/uL (ref 4.0–10.5)
nRBC: 0 % (ref 0.0–0.2)

## 2020-09-01 LAB — FERRITIN: Ferritin: 607 ng/mL — ABNORMAL HIGH (ref 24–336)

## 2020-09-01 LAB — VITAMIN D 25 HYDROXY (VIT D DEFICIENCY, FRACTURES): Vit D, 25-Hydroxy: 55.97 ng/mL (ref 30–100)

## 2020-09-01 LAB — VITAMIN B12: Vitamin B-12: 208 pg/mL (ref 180–914)

## 2020-09-07 NOTE — Progress Notes (Signed)
Brett [name], [place] [id number]   CLINIC:  Medical Oncology/Hematology  PCP:  [name], MD [address] / [place] [address]  [phone]  REASON FOR VISIT:  Follow-up for IDA  PRIOR THERAPY: None  CURRENT THERAPY: Iron tablets 650 mg on M/W/F and 325 mg all other days; intermittent Feraheme last on 07/01/2020  INTERVAL HISTORY:  Mr. JODEE MARQUARD, a 69 y.o. male, returns for routine follow-up for his IDA. Aavin was last seen by Francene Finders on 05/13/2020.  Today he reports that he had improvement in energy levels after last iron infusion.  However he has been feeling weak for last 1 week.  Energy levels are 50%.  Appetite is 100%.  Last Feraheme was on 07/01/2020.  Denies any bleeding per rectum or melena.  REVIEW OF SYSTEMS:  Review of Systems  Respiratory: Positive for cough.   Cardiovascular: Positive for leg swelling.  Neurological: Positive for numbness.  All other systems reviewed and are negative.   PAST MEDICAL/SURGICAL HISTORY:  Past Medical History:  Diagnosis Date  . Chronic diastolic heart failure ([place])   . COPD (chronic obstructive pulmonary disease) ([facility])   . Depression   . Dextrocardia   . Diabetes mellitus   . GERD (gastroesophageal reflux disease)   . H. pylori infection 11/09/2012   treated with pylera; no H. pylori on EGD in April 2021  . HLD (hyperlipidemia)   . HTN (hypertension)    Cholesterol  . Iron deficiency anemia, unspecified 10/18/2012  . Neuropathy   . Pneumonia   . Situs inversus totalis   . Tachycardia    Past Surgical History:  Procedure Laterality Date  . BIOPSY  01/09/2020   Procedure: BIOPSY;  Surgeon: Daneil Dolin, MD;  [facility];  Service: Endoscopy;;  . CARPAL TUNNEL RELEASE Right 02/12/2020   Procedure: RIGHT CARPAL TUNNEL RELEASE;  Surgeon: Leandrew Koyanagi, MD;  Location: [facility];  Service: Orthopedics;  Laterality: Right;  . CARPAL  TUNNEL RELEASE Left 04/22/2020   Procedure: LEFT CARPAL TUNNEL RELEASE;  Surgeon: Leandrew Koyanagi, MD;  Location: [facility];  Service: Orthopedics;  Laterality: Left;  . CATARACT EXTRACTION, BILATERAL  2016  . COLONOSCOPY WITH ESOPHAGOGASTRODUODENOSCOPY (EGD) N/A 11/09/2012   Dr. 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 hpylori on bx- treated with pylera. TCS- normal rectum. 1 diminutive polyp in the mid descending segment. 1-58mm polyp in the mid desending segment o/w the remainder of the colonic mucosa appeared normal. tubular adenoma on bx  . COLONOSCOPY WITH PROPOFOL N/A 01/09/2020   Procedure: COLONOSCOPY WITH PROPOFOL;  Surgeon: Daneil Dolin, MD;  Four 3-5 mm polyps in the hepatic flexure and cecum resected and retrieved, otherwise normal exam.  Pathology with tubular adenomas.  Recommended repeat colonoscopy in 3 years.  . ESOPHAGOGASTRODUODENOSCOPY (EGD) WITH PROPOFOL N/A 01/09/2020   Procedure: ESOPHAGOGASTRODUODENOSCOPY (EGD) WITH PROPOFOL;  Surgeon: Daneil Dolin, MD; normal exam.  Negative for H. pylori   . GIVENS CAPSULE STUDY N/A 10/07/2013   no source for anemia or heme positive stool noted  . NECK SURGERY     bone spurs  . POLYPECTOMY  01/09/2020   Procedure: POLYPECTOMY;  Surgeon: Daneil Dolin, MD;  [facility];  Service: Endoscopy;;    SOCIAL HISTORY:  Social History   Socioeconomic History  . Marital status: Married    Spouse name:  Not on file  . Number of children: 0  . Years of education: 7th grade   . Highest education level: Not on file  Occupational History  . Occupation: Biomedical scientist   Tobacco Use  . Smoking status: Never Smoker  . Smokeless tobacco: Current User    Types: Chew  Vaping Use  . Vaping Use: Never used  Substance and Sexual Activity  . Alcohol use: No  . Drug use: No  . Sexual activity: Yes  Other Topics Concern  . Not on file   Social History Narrative  . Not on file   Social Determinants of Health   Financial Resource Strain: Not on file  Food Insecurity: Not on file  Transportation Needs: Not on file  Physical Activity: Not on file  Stress: Not on file  Social Connections: Not on file  Intimate Partner Violence: Not on file    FAMILY HISTORY:  Family History  Problem Relation Age of Onset  . Heart attack Mother   . Diabetes Sister        Fx  . Heart defect Sister        Fx  . Arthritis Sister        Fx  . Asthma Sister        Fx  . Kidney disease Sister        Fx  . Pancreatitis Sister   . Colon cancer Neg Hx     CURRENT MEDICATIONS:  Current Outpatient Medications  Medication Sig Dispense Refill  . albuterol (PROAIR HFA) 108 (90 Base) MCG/ACT inhaler Inhale 2 puffs into the lungs every 4 (four) hours as needed for wheezing or shortness of breath. (Patient taking differently: Inhale 2 puffs into the lungs as needed for wheezing or shortness of breath. ) 1 Inhaler 2  . aspirin EC 81 MG tablet Take 81 mg by mouth daily.    Marland Kitchen bismuth subsalicylate (PEPTO-BISMOL) 262 MG/15ML suspension Take 30 mLs by mouth as needed.    . budesonide-formoterol (SYMBICORT) 160-4.5 MCG/ACT inhaler Inhale 2 puffs into the lungs 2 (two) times daily. 1 Inhaler 5  . calcium-vitamin D (OSCAL WITH D) 500-200 MG-UNIT tablet Take 1 tablet by mouth.    . cetirizine (ZYRTEC) 10 MG tablet Take 10 mg by mouth daily.    . Cinnamon 500 MG capsule Take 2,000 mg by mouth daily.     Marland Kitchen co-enzyme Q-10 30 MG capsule Take 30 mg by mouth 3 (three) times daily.    . DULoxetine (CYMBALTA) 60 MG capsule Take 60 mg by mouth daily.    . fenofibrate (TRICOR) 145 MG tablet Take 145 mg by mouth every evening.     . ferrous sulfate 325 (65 FE) MG tablet Take 325-650 mg by mouth See admin instructions. Take two tablets on Monday Wednesday, Friday. And 1 tablet on all other days    . folic acid (FOLVITE) 1 MG tablet Take 1 mg by mouth daily.     . furosemide (LASIX) 40 MG tablet Take 1-1.5 tablets (40-60 mg total) by mouth as directed. 60 mg in the  Morning,40 mg in the evening 225 tablet 2  . gabapentin (NEURONTIN) 600 MG tablet Take 1,200 mg by mouth 3 (three) times daily.    Marland Kitchen guaiFENesin (MUCINEX) 600 MG 12 hr tablet Take 600 mg by mouth 1 day or 1 dose.    Marland Kitchen HYDROcodone-acetaminophen (NORCO) 5-325 MG tablet Take 1-2 tablets by mouth 3 (three) times daily as needed. 30 tablet 0  . insulin  detemir (LEVEMIR) 100 UNIT/ML injection Inject 30 Units into the skin at bedtime.     Marland Kitchen LINZESS 145 MCG CAPS capsule TAKE ONE CAPSULE BY MOUTH ONCE DAILY BEFORE BREAKFAST. (Patient taking differently: Take 145 mcg by mouth daily before breakfast. ) 90 capsule 3  . losartan (COZAAR) 100 MG tablet Take 100 mg by mouth daily.    . metFORMIN (GLUCOPHAGE) 1000 MG tablet Take 1,000 mg by mouth 2 (two) times daily with a meal.    . metoprolol tartrate (LOPRESSOR) 25 MG tablet TAKE (1) TABLET BY MOUTH TWICE DAILY. 180 tablet 1  . Multiple Vitamin (MULTIVITAMIN) tablet Take 1 tablet by mouth daily.    . mupirocin ointment (BACTROBAN) 2 % Apply to affected area 3 times daily 22 g 0  . ondansetron (ZOFRAN) 4 MG tablet Take 1-2 tablets (4-8 mg total) by mouth every 8 (eight) hours as needed for nausea or vomiting. (Patient taking differently: Take 4-8 mg by mouth as needed for nausea or vomiting. ) 20 tablet 0  . pantoprazole (PROTONIX) 40 MG tablet Take 1 tablet (40 mg total) by mouth daily. 30 tablet 11  . pioglitazone (ACTOS) 45 MG tablet Take 45 mg by mouth daily.    Marland Kitchen rOPINIRole (REQUIP) 2 MG tablet Take 2 mg by mouth at bedtime.     . rosuvastatin (CRESTOR) 40 MG tablet Take 40 mg by mouth every evening.     . sitaGLIPtin (JANUVIA) 50 MG tablet Take 50 mg by mouth daily.     . traMADol (ULTRAM) 50 MG tablet Take 50-100 mg by mouth every 6 (six) hours as needed for moderate pain.     . traZODone (DESYREL) 150 MG tablet Take 150 mg by mouth at bedtime.      No current facility-administered medications for this visit.    ALLERGIES:  No Known Allergies  PHYSICAL EXAM:  Performance status (ECOG): 1 - Symptomatic but completely ambulatory  There were no vitals filed for this visit. Wt Readings from Last 3 Encounters:  08/11/20 214 lb 12.8 oz (97.4 kg)  06/22/20 216 lb 3.2 oz (98.1 kg)  05/13/20 219 lb 11.2 oz (99.7 kg)   Physical Exam Vitals reviewed.  Constitutional:      Appearance: Normal appearance.  Cardiovascular:     Rate and Rhythm: Normal rate and regular rhythm.     Heart sounds: Normal heart sounds.  Pulmonary:     Breath sounds: Normal breath sounds.  Skin:    General: Skin is warm.  Neurological:     Mental Status: He is alert and oriented to person, place, and time.  Psychiatric:        Mood and Affect: Mood normal.        Behavior: Behavior normal.     LABORATORY DATA:  I have reviewed the labs as listed.  CBC Latest Ref Rng & Units 09/01/2020 08/11/2020 06/17/2020  WBC 4.0 - 10.5 K/uL 9.7 10.3 10.8(H)  Hemoglobin 13.0 - 17.0 g/dL 10.6(L) 12.7(L) 11.8(L)  Hematocrit 39.0 - 52.0 % 34.1(L) 39.5 36.8(L)  Platelets 150 - 400 K/uL 308 349 321   CMP Latest Ref Rng & Units 09/01/2020 08/11/2020 05/06/2020  Glucose 70 - 99 mg/dL 112(H) 71 90  BUN 8 - 23 mg/dL 17 17 19   Creatinine 0.61 - 1.24 mg/dL 1.15 1.22 1.10  Sodium 135 - 145 mmol/L 134(L) 135 134(L)  Potassium 3.5 - 5.1 mmol/L 4.6 4.0 4.1  Chloride 98 - 111 mmol/L 96(L) 98 95(L)  CO2 22 - 32  mmol/L 29 28 28   Calcium 8.9 - 10.3 mg/dL 9.2 9.6 9.6  Total Protein 6.5 - 8.1 g/dL 7.2 7.5 7.4  Total Bilirubin 0.3 - 1.2 mg/dL 0.4 0.5 0.5  Alkaline Phos 38 - 126 U/L 32(L) 38 37(L)  AST 15 - 41 U/L 24 28 28   ALT 0 - 44 U/L 18 22 21       Component Value Date/Time   RBC 3.44 (L) 09/01/2020 1228   MCV 99.1 09/01/2020 1228   MCV 91 01/03/2020 0948   MCH 30.8 09/01/2020 1228   MCHC 31.1 09/01/2020 1228   RDW 13.2 09/01/2020 1228   RDW 12.9 01/03/2020 0948    LYMPHSABS 2.1 09/01/2020 1228   LYMPHSABS 1.9 01/03/2020 0948   MONOABS 1.0 09/01/2020 1228   EOSABS 0.1 09/01/2020 1228   EOSABS 0.1 01/03/2020 0948   BASOSABS 0.0 09/01/2020 1228   BASOSABS 0.0 01/03/2020 0948   Lab Results  Component Value Date   VD25OH 55.97 09/01/2020   Lab Results  Component Value Date   TIBC 530 (H) 09/01/2020   TIBC 451 (H) 06/17/2020   TIBC 553 (H) 05/06/2020   FERRITIN 607 (H) 09/01/2020   FERRITIN 33 06/17/2020   FERRITIN 373 (H) 05/06/2020   IRONPCTSAT 10 (L) 09/01/2020   IRONPCTSAT 16 (L) 06/17/2020   IRONPCTSAT 17 (L) 05/06/2020    DIAGNOSTIC IMAGING:  I have independently reviewed the scans and discussed with the patient. CT Abdomen Pelvis W Contrast  Result Date: 08/12/2020 CLINICAL DATA:  Infraumbilical abdominal pain for the past week. EXAM: CT ABDOMEN AND PELVIS WITH CONTRAST TECHNIQUE: Multidetector CT imaging of the abdomen and pelvis was performed using the standard protocol following bolus administration of intravenous contrast. CONTRAST:  159mL OMNIPAQUE IOHEXOL 300 MG/ML  SOLN COMPARISON:  CT abdomen pelvis dated July 16, 2018. FINDINGS: Lower chest: No acute abnormality. Unchanged volume loss and round atelectasis in the right lower lobe. Dextrocardia related to situs inversus totalis. Hepatobiliary: No focal liver abnormality is seen. Decompressed gallbladder. No gallstones, gallbladder wall thickening, or biliary dilatation. Pancreas: Mild atrophy. No ductal dilatation or surrounding inflammatory changes. Spleen: Normal in size without focal abnormality. Adrenals/Urinary Tract: Adrenal glands are unremarkable. Kidneys are normal, without renal calculi, focal lesion, or hydronephrosis. Mild circumferential bladder wall thickening. Stomach/Bowel: Stomach is within normal limits. Appendix appears normal. No evidence of bowel wall thickening, distention, or inflammatory changes. Vascular/Lymphatic: Aortic atherosclerosis. No enlarged abdominal  or pelvic lymph nodes. Reproductive: Prostate is unremarkable. Other: No abdominal wall hernia or abnormality. No abdominopelvic ascites. Musculoskeletal: No acute or significant osseous findings. Chronic L1 and L2 superior endplate compression deformities. IMPRESSION: 1. No acute intra-abdominal process. 2. Mild circumferential bladder wall thickening may in part be related to underdistension. Correlate with urinalysis to exclude cystitis. 3. Situs inversus totalis. 4. Aortic Atherosclerosis (ICD10-I70.0). Electronically Signed   By: Titus Dubin [name]   On: 08/12/2020 12:42     ASSESSMENT:  1. Iron deficiency anemia: -Hospitalized in October 2020 when his hemoglobin dropped and had received Feraheme on 07/09/2019. -Colonoscopy on 01/09/2020 shows for subcentimeter polyps in the hepatic flexure, cecum.  Rest of the exam was normal. -EGD on 01/09/2020 shows normal esophagus, stomach and duodenum.   PLAN:  1. Iron deficiency anemia: -He received Feraheme on 06/24/2020 on 07/01/2020. -Felt better after iron infusion.  However he started feeling weak and tired for the last 1 week. -Reviewed labs from 09/01/2020.  Ferritin is 607 with percent saturation of 10.  Hemoglobin is 10.6 with normal white  count and platelets. -Iron panel is suggestive of chronic inflammation. -I did not recommend any iron treatment at this time.  Plan to repeat labs in 8 weeks. -I have recommended stool cards for occult blood.  2.Leukocytosis and thrombocytosis: -Previous work-up for myeloproliferative disorders was negative.  His platelet count and white count normalized.  3.  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  Orders placed this encounter:  No orders of the defined types were placed in this encounter.    Doreatha Massed, MD [facility] [phone]   I, Drue Second,  am acting as a scribe for Dr. Payton Mccallum.  I, Doreatha Massed MD, have reviewed the above documentation for accuracy and completeness, and I agree with the above.

## 2020-09-08 ENCOUNTER — Encounter (HOSPITAL_COMMUNITY): Payer: Self-pay | Admitting: Hematology

## 2020-09-08 ENCOUNTER — Inpatient Hospital Stay (HOSPITAL_COMMUNITY): Payer: Medicare HMO | Admitting: Hematology

## 2020-09-08 ENCOUNTER — Other Ambulatory Visit: Payer: Self-pay

## 2020-09-08 VITALS — BP 127/44 | HR 59 | Temp 97.0°F | Resp 20 | Wt 219.0 lb

## 2020-09-08 DIAGNOSIS — D509 Iron deficiency anemia, unspecified: Secondary | ICD-10-CM | POA: Diagnosis not present

## 2020-09-08 DIAGNOSIS — D500 Iron deficiency anemia secondary to blood loss (chronic): Secondary | ICD-10-CM

## 2020-09-08 NOTE — Patient Instructions (Signed)
[facility] at [facility] Discharge Instructions  You were seen today by Dr. Katragadda. Follow up as scheduled.   Thank you for choosing [facility] at [facility] to provide your oncology and hematology care.  To afford each patient quality time with our provider, please arrive at least 15 minutes before your scheduled appointment time.   If you have a lab appointment with the Cancer Center please come in thru the Main Entrance and check in at the main information desk.  You need to re-schedule your appointment should you arrive 10 or more minutes late.  We strive to give you quality time with our providers, and arriving late affects you and other patients whose appointments are after yours.  Also, if you no show three or more times for appointments you may be dismissed from the clinic at the providers discretion.     Again, thank you for choosing [facility].  Our hope is that these requests will decrease the amount of time that you wait before being seen by our physicians.       _____________________________________________________________  Should you have questions after your visit to [facility], please contact our office at [phone] and follow the prompts.  Our office hours are 8:00 a.m. and 4:30 p.m. Monday - Friday.  Please note that voicemails left after 4:00 p.m. may not be returned until the following business day.  We are closed weekends and major holidays.  You do have access to a nurse 24-7, just call the main number to the clinic [phone] and do not press any options, hold on the line and a nurse will answer the phone.    For prescription refill requests, have your pharmacy contact our office and allow 72 hours.    Due to Covid, you will need to wear a mask upon entering the hospital. If you do not have a mask, a mask will be given to you at the Main Entrance upon arrival. For doctor visits, patients  may have 1 support person age 18 or older with them. For treatment visits, patients can not have anyone with them due to social distancing guidelines and our immunocompromised population.      

## 2020-09-21 ENCOUNTER — Other Ambulatory Visit (HOSPITAL_COMMUNITY): Payer: Self-pay

## 2020-09-21 DIAGNOSIS — D500 Iron deficiency anemia secondary to blood loss (chronic): Secondary | ICD-10-CM

## 2020-09-24 ENCOUNTER — Ambulatory Visit: Payer: Medicare HMO | Admitting: Nurse Practitioner

## 2020-10-12 ENCOUNTER — Other Ambulatory Visit: Payer: Self-pay

## 2020-10-12 ENCOUNTER — Telehealth: Payer: Self-pay | Admitting: Cardiology

## 2020-10-12 MED ORDER — FUROSEMIDE 40 MG PO TABS
40.0000 mg | ORAL_TABLET | ORAL | 2 refills | Status: DC
Start: 2020-10-12 — End: 2020-11-27

## 2020-10-12 MED ORDER — FUROSEMIDE 40 MG PO TABS
40.0000 mg | ORAL_TABLET | ORAL | 2 refills | Status: DC
Start: 2020-10-12 — End: 2020-10-12

## 2020-10-12 NOTE — Telephone Encounter (Signed)
Medication refill request for furosemide 40 mg tablet approved and sent to pharmacy

## 2020-10-12 NOTE — Telephone Encounter (Signed)
*  STAT* If patient is at the pharmacy, call can be transferred to refill team.   1. Which medications need to be refilled? (please list name of each medication and dose if known) furosemide (LASIX) 40 MG tablet  2. Which pharmacy/location (including street and city if local pharmacy) is medication to be sent to? [facility]  3. Do they need a 30 day or 90 day supply? [place]

## 2020-10-13 ENCOUNTER — Other Ambulatory Visit: Payer: Self-pay | Admitting: Gastroenterology

## 2020-10-13 DIAGNOSIS — K219 Gastro-esophageal reflux disease without esophagitis: Secondary | ICD-10-CM

## 2020-10-21 ENCOUNTER — Other Ambulatory Visit (HOSPITAL_COMMUNITY): Payer: Self-pay | Admitting: Family Medicine

## 2020-10-21 DIAGNOSIS — M25512 Pain in left shoulder: Secondary | ICD-10-CM

## 2020-11-02 ENCOUNTER — Inpatient Hospital Stay (HOSPITAL_COMMUNITY): Payer: Medicare HMO | Attending: Hematology

## 2020-11-02 ENCOUNTER — Other Ambulatory Visit: Payer: Self-pay

## 2020-11-02 DIAGNOSIS — Z7984 Long term (current) use of oral hypoglycemic drugs: Secondary | ICD-10-CM | POA: Diagnosis not present

## 2020-11-02 DIAGNOSIS — D72829 Elevated white blood cell count, unspecified: Secondary | ICD-10-CM | POA: Diagnosis not present

## 2020-11-02 DIAGNOSIS — D509 Iron deficiency anemia, unspecified: Secondary | ICD-10-CM | POA: Diagnosis present

## 2020-11-02 DIAGNOSIS — Z79899 Other long term (current) drug therapy: Secondary | ICD-10-CM | POA: Insufficient documentation

## 2020-11-02 DIAGNOSIS — I10 Essential (primary) hypertension: Secondary | ICD-10-CM | POA: Diagnosis not present

## 2020-11-02 DIAGNOSIS — Z7982 Long term (current) use of aspirin: Secondary | ICD-10-CM | POA: Diagnosis not present

## 2020-11-02 DIAGNOSIS — D500 Iron deficiency anemia secondary to blood loss (chronic): Secondary | ICD-10-CM

## 2020-11-02 DIAGNOSIS — E119 Type 2 diabetes mellitus without complications: Secondary | ICD-10-CM | POA: Diagnosis not present

## 2020-11-02 DIAGNOSIS — Q893 Situs inversus: Secondary | ICD-10-CM | POA: Diagnosis not present

## 2020-11-02 LAB — CBC WITH DIFFERENTIAL/PLATELET
Abs Immature Granulocytes: 0.04 10*3/uL (ref 0.00–0.07)
Basophils Absolute: 0 10*3/uL (ref 0.0–0.1)
Basophils Relative: 0 %
Eosinophils Absolute: 0.1 10*3/uL (ref 0.0–0.5)
Eosinophils Relative: 1 %
HCT: 39.6 % (ref 39.0–52.0)
Hemoglobin: 12.3 g/dL — ABNORMAL LOW (ref 13.0–17.0)
Immature Granulocytes: 0 %
Lymphocytes Relative: 13 %
Lymphs Abs: 1.3 10*3/uL (ref 0.7–4.0)
MCH: 30 pg (ref 26.0–34.0)
MCHC: 31.1 g/dL (ref 30.0–36.0)
MCV: 96.6 fL (ref 80.0–100.0)
Monocytes Absolute: 0.8 10*3/uL (ref 0.1–1.0)
Monocytes Relative: 8 %
Neutro Abs: 8.1 10*3/uL — ABNORMAL HIGH (ref 1.7–7.7)
Neutrophils Relative %: 78 %
Platelets: 321 10*3/uL (ref 150–400)
RBC: 4.1 MIL/uL — ABNORMAL LOW (ref 4.22–5.81)
RDW: 12.8 % (ref 11.5–15.5)
WBC: 10.4 10*3/uL (ref 4.0–10.5)
nRBC: 0 % (ref 0.0–0.2)

## 2020-11-02 LAB — FERRITIN: Ferritin: 693 ng/mL — ABNORMAL HIGH (ref 24–336)

## 2020-11-02 LAB — IRON AND TIBC
Iron: 104 ug/dL (ref 45–182)
Saturation Ratios: 21 % (ref 17.9–39.5)
TIBC: 497 ug/dL — ABNORMAL HIGH (ref 250–450)
UIBC: 393 ug/dL

## 2020-11-06 LAB — METHYLMALONIC ACID, SERUM: Methylmalonic Acid, Quantitative: 261 nmol/L (ref 0–378)

## 2020-11-09 ENCOUNTER — Inpatient Hospital Stay (HOSPITAL_COMMUNITY): Payer: Medicare HMO | Admitting: Hematology

## 2020-11-09 ENCOUNTER — Other Ambulatory Visit: Payer: Self-pay

## 2020-11-09 VITALS — BP 137/74 | HR 64 | Temp 97.0°F | Resp 20 | Wt 215.5 lb

## 2020-11-09 DIAGNOSIS — D509 Iron deficiency anemia, unspecified: Secondary | ICD-10-CM | POA: Diagnosis not present

## 2020-11-09 DIAGNOSIS — D500 Iron deficiency anemia secondary to blood loss (chronic): Secondary | ICD-10-CM | POA: Diagnosis not present

## 2020-11-09 NOTE — Progress Notes (Signed)
[place], [place] [address]   CLINIC:  Medical Oncology/Hematology  PCP:  [name], MD [address] [place] / [place] [address]  [phone]  REASON FOR VISIT:  Follow-up for IDA  PRIOR THERAPY: None  CURRENT THERAPY: Intermittent Feraheme last on 07/01/2020; iron tablets 650 mg on M/W/F & 325 mg rest of days  INTERVAL HISTORY:  [name], a 69 y.o. male, returns for routine follow-up for his IDA. Brett [name] was last seen on 09/08/2020.  Today he reports feeling well. He continues having SOB and neck pain due to arthritis. His energy levels are good, though they could be better.   REVIEW OF SYSTEMS:  Review of Systems  Constitutional: Negative for appetite change and fatigue.  Respiratory: Positive for shortness of breath.   Musculoskeletal: Positive for neck pain (5/10 neck pain d/t arthritis).  All other systems reviewed and are negative.   PAST MEDICAL/SURGICAL HISTORY:  Past Medical History:  Diagnosis Date  . Chronic diastolic heart failure ([place])   . COPD (chronic obstructive pulmonary disease) ([place])   . Depression   . Dextrocardia   . Diabetes mellitus   . GERD (gastroesophageal reflux disease)   . H. pylori infection 11/09/2012   treated with pylera; no H. pylori on EGD in April 2021  . HLD (hyperlipidemia)   . HTN (hypertension)    Cholesterol  . Iron deficiency anemia, unspecified 10/18/2012  . Neuropathy   . Pneumonia   . Situs inversus totalis   . Tachycardia    Past Surgical History:  Procedure Laterality Date  . BIOPSY  01/09/2020   Procedure: BIOPSY;  Surgeon: Daneil Dolin, MD;  [facility];  Service: Endoscopy;;  . CARPAL TUNNEL RELEASE Right 02/12/2020   Procedure: RIGHT CARPAL TUNNEL RELEASE;  Surgeon: Leandrew Koyanagi, MD;  Location: [facility];  Service: Orthopedics;  Laterality: Right;  . CARPAL TUNNEL RELEASE Left 04/22/2020   Procedure: LEFT CARPAL TUNNEL  RELEASE;  Surgeon: Leandrew Koyanagi, MD;  Location: [facility];  Service: Orthopedics;  Laterality: Left;  . CATARACT EXTRACTION, BILATERAL  2016  . COLONOSCOPY WITH ESOPHAGOGASTRODUODENOSCOPY (EGD) N/A 11/09/2012   Dr. 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 hpylori on bx- treated with pylera. TCS- normal rectum. 1 diminutive polyp in the mid descending segment. 1-21mm polyp in the mid desending segment o/w the remainder of the colonic mucosa appeared normal. tubular adenoma on bx  . COLONOSCOPY WITH PROPOFOL N/A 01/09/2020   Procedure: COLONOSCOPY WITH PROPOFOL;  Surgeon: Daneil Dolin, MD;  Four 3-5 mm polyps in the hepatic flexure and cecum resected and retrieved, otherwise normal exam.  Pathology with tubular adenomas.  Recommended repeat colonoscopy in 3 years.  . ESOPHAGOGASTRODUODENOSCOPY (EGD) WITH PROPOFOL N/A 01/09/2020   Procedure: ESOPHAGOGASTRODUODENOSCOPY (EGD) WITH PROPOFOL;  Surgeon: Daneil Dolin, MD; normal exam.  Negative for H. pylori   . GIVENS CAPSULE STUDY N/A 10/07/2013   no source for anemia or heme positive stool noted  . NECK SURGERY     bone spurs  . POLYPECTOMY  01/09/2020   Procedure: POLYPECTOMY;  Surgeon: Daneil Dolin, MD;  [facility];  Service: Endoscopy;;    SOCIAL HISTORY:  Social History   Socioeconomic History  . Marital status: Married    Spouse name: Not on file  . Number of children: 0  . Years of education: 7th  grade   . Highest education level: Not on file  Occupational History  . Occupation: Biomedical scientist   Tobacco Use  . Smoking status: Never Smoker  . Smokeless tobacco: Current User    Types: Chew  Vaping Use  . Vaping Use: Never used  Substance and Sexual Activity  . Alcohol use: No  . Drug use: No  . Sexual activity: Yes  Other Topics Concern  . Not on file  Social History Narrative  . Not on file   Social  Determinants of Health   Financial Resource Strain: Low Risk   . Difficulty of Paying Living Expenses: Not hard at all  Food Insecurity: No Food Insecurity  . Worried About Charity fundraiser in the Last Year: Never true  . Ran Out of Food in the Last Year: Never true  Transportation Needs: No Transportation Needs  . Lack of Transportation (Medical): No  . Lack of Transportation (Non-Medical): No  Physical Activity: Sufficiently Active  . Days of Exercise per Week: 5 days  . Minutes of Exercise per Session: 30 min  Stress: No Stress Concern Present  . Feeling of Stress : Not at all  Social Connections: Socially Isolated  . Frequency of Communication with Friends and Family: Once a week  . Frequency of Social Gatherings with Friends and Family: Once a week  . Attends Religious Services: Never  . Active Member of Clubs or Organizations: No  . Attends Archivist Meetings: Never  . Marital Status: Married  Human resources officer Violence: Not At Risk  . Fear of Current or Ex-Partner: No  . Emotionally Abused: No  . Physically Abused: No  . Sexually Abused: No    FAMILY HISTORY:  Family History  Problem Relation Age of Onset  . Heart attack Mother   . Diabetes Sister        Fx  . Heart defect Sister        Fx  . Arthritis Sister        Fx  . Asthma Sister        Fx  . Kidney disease Sister        Fx  . Pancreatitis Sister   . Colon cancer Neg Hx     CURRENT MEDICATIONS:  Current Outpatient Medications  Medication Sig Dispense Refill  . albuterol (PROAIR HFA) 108 (90 Base) MCG/ACT inhaler Inhale 2 puffs into the lungs every 4 (four) hours as needed for wheezing or shortness of breath. (Patient taking differently: Inhale 2 puffs into the lungs as needed for wheezing or shortness of breath.) 1 Inhaler 2  . aspirin EC 81 MG tablet Take 81 mg by mouth daily.    Marland Kitchen bismuth subsalicylate (PEPTO-BISMOL) 262 MG/15ML suspension Take 30 mLs by mouth as needed.    .  budesonide-formoterol (SYMBICORT) 160-4.5 MCG/ACT inhaler Inhale 2 puffs into the lungs 2 (two) times daily. 1 Inhaler 5  . calcium-vitamin D (OSCAL WITH D) 500-200 MG-UNIT tablet Take 1 tablet by mouth.    . cetirizine (ZYRTEC) 10 MG tablet Take 10 mg by mouth daily.    . Cinnamon 500 MG capsule Take 2,000 mg by mouth daily.     Marland Kitchen co-enzyme Q-10 30 MG capsule Take 30 mg by mouth 3 (three) times daily.    . DULoxetine (CYMBALTA) 60 MG capsule Take 60 mg by mouth daily.    . fenofibrate (TRICOR) 145 MG tablet Take 145 mg by mouth every evening.     . ferrous sulfate 325 (  65 FE) MG tablet Take 325-650 mg by mouth See admin instructions. Take two tablets on Monday Wednesday, Friday. And 1 tablet on all other days    . folic acid (FOLVITE) 1 MG tablet Take 1 mg by mouth daily.    . furosemide (LASIX) 40 MG tablet Take 1-1.5 tablets (40-60 mg total) by mouth as directed. 60 mg in the  Morning,40 mg in the evening 225 tablet 2  . gabapentin (NEURONTIN) 600 MG tablet Take 1,200 mg by mouth 3 (three) times daily.    Marland Kitchen guaiFENesin (MUCINEX) 600 MG 12 hr tablet Take 600 mg by mouth 1 day or 1 dose.    Marland Kitchen HYDROcodone-acetaminophen (NORCO) 5-325 MG tablet Take 1-2 tablets by mouth 3 (three) times daily as needed. 30 tablet 0  . hydrOXYzine (ATARAX/VISTARIL) 10 MG tablet     . LEVEMIR FLEXTOUCH 100 UNIT/ML FlexPen SMARTSIG:40 Unit(s) SUB-Q Daily    . LINZESS 145 MCG CAPS capsule TAKE ONE CAPSULE BY MOUTH ONCE DAILY BEFORE BREAKFAST. (Patient taking differently: Take 145 mcg by mouth daily before breakfast.) 90 capsule 3  . losartan (COZAAR) 100 MG tablet Take 100 mg by mouth daily.    . metFORMIN (GLUCOPHAGE) 1000 MG tablet Take 1,000 mg by mouth 2 (two) times daily with a meal.    . metoprolol tartrate (LOPRESSOR) 25 MG tablet TAKE (1) TABLET BY MOUTH TWICE DAILY. 180 tablet 1  . Multiple Vitamin (MULTIVITAMIN) tablet Take 1 tablet by mouth daily.    . mupirocin ointment (BACTROBAN) 2 % Apply to affected area  3 times daily 22 g 0  . ondansetron (ZOFRAN) 4 MG tablet Take 1-2 tablets (4-8 mg total) by mouth every 8 (eight) hours as needed for nausea or vomiting. (Patient taking differently: Take 4-8 mg by mouth as needed for nausea or vomiting.) 20 tablet 0  . pantoprazole (PROTONIX) 40 MG tablet TAKE 1 TABLET EVERY DAY 90 tablet 3  . pioglitazone (ACTOS) 45 MG tablet Take 45 mg by mouth daily.    . pregabalin (LYRICA) 75 MG capsule Take by mouth 2 (two) times daily.    Marland Kitchen rOPINIRole (REQUIP) 2 MG tablet Take 2 mg by mouth at bedtime.     . rosuvastatin (CRESTOR) 40 MG tablet Take 40 mg by mouth every evening.     . sitaGLIPtin (JANUVIA) 50 MG tablet Take 50 mg by mouth daily.     . traMADol (ULTRAM) 50 MG tablet Take 50-100 mg by mouth every 6 (six) hours as needed for moderate pain.     . traZODone (DESYREL) 150 MG tablet Take 150 mg by mouth at bedtime.     No current facility-administered medications for this visit.    ALLERGIES:  No Known Allergies  PHYSICAL EXAM:  Performance status (ECOG): 1 - Symptomatic but completely ambulatory  Vitals:   11/09/20 1109  BP: 137/74  Pulse: 64  Resp: 20  Temp: (!) 97 F (36.1 C)  SpO2: 93%   Wt Readings from Last 3 Encounters:  11/09/20 215 lb 8 oz (97.8 kg)  09/08/20 219 lb (99.3 kg)  08/11/20 214 lb 12.8 oz (97.4 kg)   Physical Exam Vitals reviewed.  Constitutional:      Appearance: Normal appearance. He is obese.  Cardiovascular:     Rate and Rhythm: Normal rate and regular rhythm.     Pulses: Normal pulses.     Heart sounds: Normal heart sounds.  Pulmonary:     Effort: Pulmonary effort is normal.  Breath sounds: Normal breath sounds.  Musculoskeletal:     Right lower leg: No edema.     Left lower leg: No edema.  Neurological:     General: No focal deficit present.     Mental Status: He is alert and oriented to person, place, and time.  Psychiatric:        Mood and Affect: Mood normal.        Behavior: Behavior normal.      LABORATORY DATA:  I have reviewed the labs as listed.  CBC Latest Ref Rng & Units 11/02/2020 09/01/2020 08/11/2020  WBC 4.0 - 10.5 K/uL 10.4 9.7 10.3  Hemoglobin 13.0 - 17.0 g/dL 12.3(L) 10.6(L) 12.7(L)  Hematocrit 39.0 - 52.0 % 39.6 34.1(L) 39.5  Platelets 150 - 400 K/uL 321 308 349   CMP Latest Ref Rng & Units 09/01/2020 08/11/2020 05/06/2020  Glucose 70 - 99 mg/dL 112(H) 71 90  BUN 8 - 23 mg/dL 17 17 19   Creatinine 0.61 - 1.24 mg/dL 1.15 1.22 1.10  Sodium 135 - 145 mmol/L 134(L) 135 134(L)  Potassium 3.5 - 5.1 mmol/L 4.6 4.0 4.1  Chloride 98 - 111 mmol/L 96(L) 98 95(L)  CO2 22 - 32 mmol/L 29 28 28   Calcium 8.9 - 10.3 mg/dL 9.2 9.6 9.6  Total Protein 6.5 - 8.1 g/dL 7.2 7.5 7.4  Total Bilirubin 0.3 - 1.2 mg/dL 0.4 0.5 0.5  Alkaline Phos 38 - 126 U/L 32(L) 38 37(L)  AST 15 - 41 U/L 24 28 28   ALT 0 - 44 U/L 18 22 21       Component Value Date/Time   RBC 4.10 (L) 11/02/2020 1112   MCV 96.6 11/02/2020 1112   MCV 91 01/03/2020 0948   MCH 30.0 11/02/2020 1112   MCHC 31.1 11/02/2020 1112   RDW 12.8 11/02/2020 1112   RDW 12.9 01/03/2020 0948   LYMPHSABS 1.3 11/02/2020 1112   LYMPHSABS 1.9 01/03/2020 0948   MONOABS 0.8 11/02/2020 1112   EOSABS 0.1 11/02/2020 1112   EOSABS 0.1 01/03/2020 0948   BASOSABS 0.0 11/02/2020 1112   BASOSABS 0.0 01/03/2020 0948   Lab Results  Component Value Date   TIBC 497 (H) 11/02/2020   TIBC 530 (H) 09/01/2020   TIBC 451 (H) 06/17/2020   FERRITIN 693 (H) 11/02/2020   FERRITIN 607 (H) 09/01/2020   FERRITIN 33 06/17/2020   IRONPCTSAT 21 11/02/2020   IRONPCTSAT 10 (L) 09/01/2020   IRONPCTSAT 16 (L) 06/17/2020    DIAGNOSTIC IMAGING:  I have independently reviewed the scans and discussed with the patient. No results found.   ASSESSMENT:  1. Iron deficiency anemia: -Hospitalized in October 2020 when his hemoglobin dropped and had received Feraheme on 07/09/2019. -Colonoscopy on 01/09/2020 shows for subcentimeter polyps in the hepatic  flexure, cecum. Rest of the exam was normal. -EGD on 01/09/2020 shows normal esophagus, stomach and duodenum.   PLAN:  1. Iron deficiency anemia: -Last Feraheme was on 07/01/2020. -Denies any bleeding per rectum or melena. -Reviewed labs from 11/02/2020.  Ferritin is 693 and percent saturation is 21.  Methylmalonic acid was normal.  Hemoglobin is improved to 12.3 with MCV of 96. -No iron treatment is needed at this time.  RTC 4 months with labs.  2.Leukocytosis and thrombocytosis: -Previous work-up for myeloproliferative disorders was negative.  Counts have normalized.  3.  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  Orders placed this encounter:  Orders Placed  This Encounter  Procedures  . CBC with Differential/Platelet  . Ferritin  . Iron and TIBC     Derek Jack, MD [place] [phone]   I, Milinda Antis, am acting as a scribe for Dr. Sanda Linger.  I, Derek Jack MD, have reviewed the above documentation for accuracy and completeness, and I agree with the above.

## 2020-11-09 NOTE — Patient Instructions (Signed)
[place] at [facility] Discharge Instructions  You were seen today by Dr. Delton Coombes. He went over your recent results. You may take Tylenol and ibuprofen no more than once a day as needed for pain. Dr. Delton Coombes will see you back in 4 months for labs and follow up.   Thank you for choosing [place] at [facility] to provide your oncology and hematology care.  To afford each patient quality time with our provider, please arrive at least 15 minutes before your scheduled appointment time.   If you have a lab appointment with the [facility] please come in thru the Main Entrance and check in at the main information desk  You need to re-schedule your appointment should you arrive 10 or more minutes late.  We strive to give you quality time with our providers, and arriving late affects you and other patients whose appointments are after yours.  Also, if you no show three or more times for appointments you may be dismissed from the clinic at the providers discretion.     Again, thank you for choosing [facility].  Our hope is that these requests will decrease the amount of time that you wait before being seen by our physicians.       _____________________________________________________________  Should you have questions after your visit to [facility], please contact our office at [phone] between the hours of 8:00 a.m. and 4:30 p.m.  Voicemails left after 4:00 p.m. will not be returned until the following business day.  For prescription refill requests, have your pharmacy contact our office and allow 72 hours.    Cancer Center Support Programs:   > Cancer Support Group  2nd Tuesday of the month 1pm-2pm, Journey Room

## 2020-11-27 ENCOUNTER — Telehealth: Payer: Self-pay | Admitting: Cardiology

## 2020-11-27 DIAGNOSIS — Z79899 Other long term (current) drug therapy: Secondary | ICD-10-CM

## 2020-11-27 MED ORDER — FUROSEMIDE 40 MG PO TABS: 60.0000 mg | ORAL_TABLET | Freq: Two times a day (BID) | ORAL | 3 refills | Status: DC

## 2020-11-27 NOTE — Telephone Encounter (Signed)
I spoke with patient.He confirms that he did have weight loss last week.States he feels fine other than the swelling in feet and ankles.   He will increase lasix to 60 mg bid and have bmet in 7-10 days and he will call back with update.   I mailed lab slip to his home.

## 2020-11-27 NOTE — Telephone Encounter (Signed)
1) Pt c/o swelling: How much weight have you gained and in what time span?   2) If swelling, where is the swelling located?  Legs,feet, and ankles   3) Are you currently taking a fluid pill? yes  4) Are you currently SOB? NO   5) Do you have a log of your daily weights (if so, list)?   6) Have you gained 3 pounds in a day or 5 pounds in a week? WEIGHT TODAY 215  7) Have you traveled recently? NO

## 2020-11-27 NOTE — Telephone Encounter (Signed)
I spoke with patient. He states his weight yesterday was 214.8 lbs and today it is 215 lbs.   Last week he said his weight was either 207 lbs or 208 lbs.    He c/o swelling in his feet and ankles. He takes Lasix 60 mg am and 40 mg pm.    Please advise.

## 2020-11-27 NOTE — Telephone Encounter (Signed)
     I last saw this patient in 07/2018. He saw Dr. Harl Bowie in 04/2020 and weight was 215 lbs at that time. Was also listed as 215 lbs in 10/2020 at his Hematology visit. Has it just been chronically lower on his home scales during that time or did he acutely lose weight?  If he is having swelling as well, can titrate Lasix to 60mg  BID. Repeat BMET in 7-10 days. Make [place] aware next week if no improvement in weight or symptoms.   Signed, Erma Heritage, PA-C 11/27/2020, 10:05 AM Pager: [phone]

## 2020-12-03 ENCOUNTER — Other Ambulatory Visit: Payer: Self-pay

## 2020-12-03 ENCOUNTER — Other Ambulatory Visit (HOSPITAL_COMMUNITY): Payer: Self-pay

## 2020-12-03 ENCOUNTER — Other Ambulatory Visit (HOSPITAL_COMMUNITY)
Admission: RE | Admit: 2020-12-03 | Discharge: 2020-12-03 | Disposition: A | Discharge status: Home / Self Care | Payer: Medicare HMO | Source: Ambulatory Visit | Attending: Student | Admitting: Student

## 2020-12-03 ENCOUNTER — Encounter (HOSPITAL_COMMUNITY): Payer: Self-pay

## 2020-12-03 DIAGNOSIS — Z79899 Other long term (current) drug therapy: Secondary | ICD-10-CM | POA: Insufficient documentation

## 2020-12-03 DIAGNOSIS — D509 Iron deficiency anemia, unspecified: Secondary | ICD-10-CM | POA: Diagnosis present

## 2020-12-03 DIAGNOSIS — D500 Iron deficiency anemia secondary to blood loss (chronic): Secondary | ICD-10-CM | POA: Insufficient documentation

## 2020-12-03 LAB — BASIC METABOLIC PANEL
Anion gap: 8 (ref 5–15)
BUN: 23 mg/dL (ref 8–23)
CO2: 29 mmol/L (ref 22–32)
Calcium: 9 mg/dL (ref 8.9–10.3)
Chloride: 94 mmol/L — ABNORMAL LOW (ref 98–111)
Creatinine, Ser: 1.11 mg/dL (ref 0.61–1.24)
GFR, Estimated: >60 mL/min (ref >60)
Glucose, Bld: 84 mg/dL (ref 70–99)
Potassium: 5.3 mmol/L — ABNORMAL HIGH (ref 3.5–5.1)
Sodium: 131 mmol/L — ABNORMAL LOW (ref 135–145)

## 2020-12-03 LAB — IRON AND TIBC
Iron: 32 ug/dL — ABNORMAL LOW (ref 45–182)
Saturation Ratios: 7 % — ABNORMAL LOW (ref 17.9–39.5)
TIBC: 438 ug/dL (ref 250–450)
UIBC: 406 ug/dL

## 2020-12-03 LAB — FERRITIN: Ferritin: 502 ng/mL — ABNORMAL HIGH (ref 24–336)

## 2020-12-03 NOTE — Progress Notes (Signed)
Patient called and stated that he has been feeling really tired and weak and feels that he needs to have his iron levels checked.  This nurse spoke with Dr. Delton Coombes who gave orders for Iron and Ferritin labs to be drawn. No further questions or concerns at this time.

## 2020-12-04 ENCOUNTER — Telehealth: Payer: Self-pay | Admitting: *Deleted

## 2020-12-04 ENCOUNTER — Telehealth: Payer: Self-pay | Admitting: Cardiology

## 2020-12-04 DIAGNOSIS — Z79899 Other long term (current) drug therapy: Secondary | ICD-10-CM

## 2020-12-04 NOTE — Telephone Encounter (Signed)
-----   Message from Erma Heritage, [place] sent at 12/03/2020  4:57 PM EDT ----- Please let the patient know his kidney function remains stable following recent dose adjustment of Lasix. His potassium is slightly elevated at 5.3. Please confirm that he is not taking potassium supplementation as none is listed in his chart. If he is on potassium supplementation, he needs to reduce or stop this depending on the dose.  Would also limit the intake of potassium rich foods such as bananas. Repeat BMET again in 7-10 days for reassessment.

## 2020-12-04 NOTE — Telephone Encounter (Signed)
Please call around 1205 when she is on lunch , she is returning call to [facility] for her husband lab results

## 2020-12-04 NOTE — Telephone Encounter (Signed)
Noted  

## 2020-12-15 ENCOUNTER — Other Ambulatory Visit (HOSPITAL_COMMUNITY)
Admission: RE | Admit: 2020-12-15 | Discharge: 2020-12-15 | Disposition: A | Discharge status: Home / Self Care | Payer: Medicare HMO | Source: Ambulatory Visit | Attending: Student | Admitting: Student

## 2020-12-15 ENCOUNTER — Other Ambulatory Visit: Payer: Self-pay

## 2020-12-15 DIAGNOSIS — Z79899 Other long term (current) drug therapy: Secondary | ICD-10-CM

## 2020-12-15 LAB — BASIC METABOLIC PANEL
Anion gap: 11 (ref 5–15)
BUN: 15 mg/dL (ref 8–23)
CO2: 28 mmol/L (ref 22–32)
Calcium: 9.1 mg/dL (ref 8.9–10.3)
Chloride: 95 mmol/L — ABNORMAL LOW (ref 98–111)
Creatinine, Ser: 1.13 mg/dL (ref 0.61–1.24)
GFR, Estimated: >60 mL/min (ref >60)
Glucose, Bld: 82 mg/dL (ref 70–99)
Potassium: 4.7 mmol/L (ref 3.5–5.1)
Sodium: 134 mmol/L — ABNORMAL LOW (ref 135–145)

## 2020-12-19 NOTE — Progress Notes (Deleted)
Referring Provider: [name], MD Primary Care Physician:  [name], MD Primary GI Physician: Dr. Gala Romney  No chief complaint on file.   HPI:   Brett [name] is a 70 y.o. male male with a history of Kartagener syndrome,diabetes, hypertension,COPD requiring, supplementalO2, diastolic CHF,constipation, GERD,H. Pylori s/p treatment with pylera, andiron deficiency anemiafollowing closely with hematology receiving iron infusions as needed. Last Feraheme 07/01/2020. He takes oral iron 650 mg M/W/F and 3.5 mg rest of the days.  Labs in February 2022 with hemoglobin 12.3, ferritin 693, saturation 21%.  Extensive GI work-up for IDA in2014/2015 including EGD, TCS, and Givens capsulewithout explanation. Most recent EGD/TCS in April 2021 with 4 tubular adenomas, gastric biopsy with mild chronic inflammation, negative for H. pylori.  He is due for repeat colonoscopy in 2024.   He presents today for follow-up of GERD and constipation.   Last seen in our office 08/11/2020 for abdominal pain.  He reported abdominal pain started about 1 week prior in his mid to lower abdomen described as aching with associated nausea but no vomiting.  By the time of his office visit, he felt it was improving.  He took Pepto-Bismol.  Bowels moving well though loose since abdominal pain started.  GERD remains well controlled.  On exam, he had TTP in RLQ, suprapubic, and LLQ.  Plan to update labs and pursue CT.    CBC, CMP, lipase with no significant findings.   CT A/P with contrast with no acute findings.  He did have mild circumferential bladder wall thickening which could be secondary to underdistention.   Today:   Abdominal Pain:   GERD:   Constipation:   IDA:   Past Medical History:  Diagnosis Date  . Chronic diastolic heart failure ([place])   . COPD (chronic obstructive pulmonary disease) ([place])   . Depression   . Dextrocardia   . Diabetes mellitus   . GERD (gastroesophageal reflux disease)   .  H. pylori infection 11/09/2012   treated with pylera; no H. pylori on EGD in April 2021  . HLD (hyperlipidemia)   . HTN (hypertension)    Cholesterol  . Iron deficiency anemia, unspecified 10/18/2012  . Neuropathy   . Pneumonia   . Situs inversus totalis   . Tachycardia     Past Surgical History:  Procedure Laterality Date  . BIOPSY  01/09/2020   Procedure: BIOPSY;  Surgeon: Daneil Dolin, MD;  [facility];  Service: Endoscopy;;  . CARPAL TUNNEL RELEASE Right 02/12/2020   Procedure: RIGHT CARPAL TUNNEL RELEASE;  Surgeon: Leandrew Koyanagi, MD;  Location: [facility];  Service: Orthopedics;  Laterality: Right;  . CARPAL TUNNEL RELEASE Left 04/22/2020   Procedure: LEFT CARPAL TUNNEL RELEASE;  Surgeon: Leandrew Koyanagi, MD;  Location: [facility];  Service: Orthopedics;  Laterality: Left;  . CATARACT EXTRACTION, BILATERAL  2016  . COLONOSCOPY WITH ESOPHAGOGASTRODUODENOSCOPY (EGD) N/A 11/09/2012   Dr. 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 hpylori on bx- treated with pylera. TCS- normal rectum. 1 diminutive polyp in the mid descending segment. 1-19mm polyp in the mid desending segment o/w the remainder of the colonic mucosa appeared normal. tubular adenoma on bx  . COLONOSCOPY WITH PROPOFOL N/A 01/09/2020   Procedure: COLONOSCOPY WITH PROPOFOL;  Surgeon: Daneil Dolin, MD;  Four 3-5 mm polyps in the hepatic flexure and cecum resected and retrieved, otherwise normal exam.  Pathology with tubular adenomas.  Recommended repeat colonoscopy in 3 years.  . ESOPHAGOGASTRODUODENOSCOPY (EGD) WITH PROPOFOL N/A 01/09/2020   Procedure: ESOPHAGOGASTRODUODENOSCOPY (EGD) WITH PROPOFOL;  Surgeon: Daneil Dolin, MD; normal exam.  Negative for H. pylori   . GIVENS CAPSULE STUDY N/A 10/07/2013   no source for anemia or heme positive stool noted  . NECK SURGERY     bone spurs  . POLYPECTOMY   01/09/2020   Procedure: POLYPECTOMY;  Surgeon: Daneil Dolin, MD;  [facility];  Service: Endoscopy;;    Current Outpatient Medications  Medication Sig Dispense Refill  . albuterol (PROAIR HFA) 108 (90 Base) MCG/ACT inhaler Inhale 2 puffs into the lungs every 4 (four) hours as needed for wheezing or shortness of breath. (Patient taking differently: Inhale 2 puffs into the lungs as needed for wheezing or shortness of breath.) 1 Inhaler 2  . aspirin EC 81 MG tablet Take 81 mg by mouth daily.    Marland Kitchen bismuth subsalicylate (PEPTO-BISMOL) 262 MG/15ML suspension Take 30 mLs by mouth as needed.    . budesonide-formoterol (SYMBICORT) 160-4.5 MCG/ACT inhaler Inhale 2 puffs into the lungs 2 (two) times daily. 1 Inhaler 5  . calcium-vitamin D (OSCAL WITH D) 500-200 MG-UNIT tablet Take 1 tablet by mouth.    . cetirizine (ZYRTEC) 10 MG tablet Take 10 mg by mouth daily.    . Cinnamon 500 MG capsule Take 2,000 mg by mouth daily.     Marland Kitchen co-enzyme Q-10 30 MG capsule Take 30 mg by mouth 3 (three) times daily.    . DULoxetine (CYMBALTA) 60 MG capsule Take 60 mg by mouth daily.    . fenofibrate (TRICOR) 145 MG tablet Take 145 mg by mouth every evening.     . ferrous sulfate 325 (65 FE) MG tablet Take 325-650 mg by mouth See admin instructions. Take two tablets on Monday Wednesday, Friday. And 1 tablet on all other days    . folic acid (FOLVITE) 1 MG tablet Take 1 mg by mouth daily.    . furosemide (LASIX) 40 MG tablet Take 1.5 tablets (60 mg total) by mouth 2 (two) times daily. 90 tablet 3  . gabapentin (NEURONTIN) 600 MG tablet Take 1,200 mg by mouth 3 (three) times daily.    Marland Kitchen guaiFENesin (MUCINEX) 600 MG 12 hr tablet Take 600 mg by mouth 1 day or 1 dose.    Marland Kitchen HYDROcodone-acetaminophen (NORCO) 5-325 MG tablet Take 1-2 tablets by mouth 3 (three) times daily as needed. 30 tablet 0  . hydrOXYzine (ATARAX/VISTARIL) 10 MG tablet     . LEVEMIR FLEXTOUCH 100 UNIT/ML FlexPen SMARTSIG:40 Unit(s) SUB-Q Daily     . LINZESS 145 MCG CAPS capsule TAKE ONE CAPSULE BY MOUTH ONCE DAILY BEFORE BREAKFAST. (Patient taking differently: Take 145 mcg by mouth daily before breakfast.) 90 capsule 3  . losartan (COZAAR) 100 MG tablet Take 100 mg by mouth daily.    . metFORMIN (GLUCOPHAGE) 1000 MG tablet Take 1,000 mg by mouth 2 (two) times daily with a meal.    . metoprolol tartrate (LOPRESSOR) 25 MG tablet TAKE (1) TABLET BY MOUTH TWICE DAILY. 180 tablet 1  . Multiple Vitamin (MULTIVITAMIN) tablet Take 1 tablet by mouth daily.    . mupirocin ointment (BACTROBAN) 2 % Apply to affected area 3 times daily 22 g 0  . ondansetron (ZOFRAN) 4 MG tablet Take 1-2 tablets (4-8 mg total) by mouth every 8 (eight) hours as needed for nausea or vomiting. (Patient taking differently: Take 4-8 mg by mouth as needed  for nausea or vomiting.) 20 tablet 0  . pantoprazole (PROTONIX) 40 MG tablet TAKE 1 TABLET EVERY DAY 90 tablet 3  . pioglitazone (ACTOS) 45 MG tablet Take 45 mg by mouth daily.    . pregabalin (LYRICA) 75 MG capsule Take by mouth 2 (two) times daily.    Marland Kitchen rOPINIRole (REQUIP) 2 MG tablet Take 2 mg by mouth at bedtime.     . rosuvastatin (CRESTOR) 40 MG tablet Take 40 mg by mouth every evening.     . sitaGLIPtin (JANUVIA) 50 MG tablet Take 50 mg by mouth daily.     . traMADol (ULTRAM) 50 MG tablet Take 50-100 mg by mouth every 6 (six) hours as needed for moderate pain.     . traZODone (DESYREL) 150 MG tablet Take 150 mg by mouth at bedtime.     No current facility-administered medications for this visit.    Allergies as of 12/21/2020  . (No Known Allergies)    Family History  Problem Relation Age of Onset  . Heart attack Mother   . Diabetes Sister        Fx  . Heart defect Sister        Fx  . Arthritis Sister        Fx  . Asthma Sister        Fx  . Kidney disease Sister        Fx  . Pancreatitis Sister   . Colon cancer Neg Hx     Social History   Socioeconomic History  . Marital status: Married     Spouse name: Not on file  . Number of children: 0  . Years of education: 7th grade   . Highest education level: Not on file  Occupational History  . Occupation: Biomedical scientist   Tobacco Use  . Smoking status: Never Smoker  . Smokeless tobacco: Current User    Types: Chew  Vaping Use  . Vaping Use: Never used  Substance and Sexual Activity  . Alcohol use: No  . Drug use: No  . Sexual activity: Yes  Other Topics Concern  . Not on file  Social History Narrative  . Not on file   Social Determinants of Health   Financial Resource Strain: Low Risk   . Difficulty of Paying Living Expenses: Not hard at all  Food Insecurity: No Food Insecurity  . Worried About Charity fundraiser in the Last Year: Never true  . Ran Out of Food in the Last Year: Never true  Transportation Needs: No Transportation Needs  . Lack of Transportation (Medical): No  . Lack of Transportation (Non-Medical): No  Physical Activity: Sufficiently Active  . Days of Exercise per Week: 5 days  . Minutes of Exercise per Session: 30 min  Stress: No Stress Concern Present  . Feeling of Stress : Not at all  Social Connections: Socially Isolated  . Frequency of Communication with Friends and Family: Once a week  . Frequency of Social Gatherings with Friends and Family: Once a week  . Attends Religious Services: Never  . Active Member of Clubs or Organizations: No  . Attends Archivist Meetings: Never  . Marital Status: Married    Review of Systems: Gen: Denies fever, chills, anorexia. Denies fatigue, weakness, weight loss.  CV: Denies chest pain, palpitations, syncope, peripheral edema, and claudication. Resp: Denies dyspnea at rest, cough, wheezing, coughing up blood, and pleurisy. GI: Denies vomiting blood, jaundice, and fecal incontinence.   Denies dysphagia or  odynophagia. Derm: Denies rash, itching, dry skin Psych: Denies depression, anxiety, memory loss, confusion. No homicidal or suicidal ideation.   Heme: Denies bruising, bleeding, and enlarged lymph nodes.  Physical Exam: There were no vitals taken for this visit. General:   Alert and oriented. No distress noted. Pleasant and cooperative.  Head:  Normocephalic and atraumatic. Eyes:  Conjuctiva clear without scleral icterus. Mouth:  Oral mucosa pink and moist. Good dentition. No lesions. Heart:  S1, S2 present without murmurs appreciated. Lungs:  Clear to auscultation bilaterally. No wheezes, rales, or rhonchi. No distress.  Abdomen:  +BS, soft, non-tender and non-distended. No rebound or guarding. No HSM or masses noted. Msk:  Symmetrical without gross deformities. Normal posture. Extremities:  Without edema. Neurologic:  Alert and  oriented x4 Psych:  Alert and cooperative. Normal mood and affect.

## 2020-12-21 ENCOUNTER — Ambulatory Visit: Payer: Medicare HMO | Admitting: Gastroenterology

## 2020-12-21 ENCOUNTER — Other Ambulatory Visit: Payer: Self-pay | Admitting: Gastroenterology

## 2020-12-21 ENCOUNTER — Encounter: Payer: Self-pay | Admitting: Internal Medicine

## 2020-12-21 DIAGNOSIS — K5900 Constipation, unspecified: Secondary | ICD-10-CM

## 2020-12-23 ENCOUNTER — Telehealth: Payer: Self-pay | Admitting: Internal Medicine

## 2020-12-23 NOTE — Telephone Encounter (Signed)
Lmom, waiting on a return call. RX was sent on 12/16/2020.

## 2020-12-23 NOTE — Telephone Encounter (Signed)
Patient needs linzess prescription sent to [facility] for a refill

## 2020-12-23 NOTE — Telephone Encounter (Signed)
Spoke with pts spouse and let her know I would call [facility]. I spoke with [facility] pharmacist and gave a verbal for Linzess. They didn't receive the RX from [facility], [place].

## 2021-01-01 ENCOUNTER — Other Ambulatory Visit: Payer: Self-pay

## 2021-01-01 ENCOUNTER — Ambulatory Visit: Payer: Medicare HMO | Admitting: Internal Medicine

## 2021-01-01 ENCOUNTER — Ambulatory Visit (HOSPITAL_COMMUNITY)
Admission: RE | Admit: 2021-01-01 | Discharge: 2021-01-01 | Disposition: A | Discharge status: Home / Self Care | Payer: Medicare HMO | Source: Ambulatory Visit | Attending: Internal Medicine | Admitting: Internal Medicine

## 2021-01-01 ENCOUNTER — Encounter: Payer: Self-pay | Admitting: Internal Medicine

## 2021-01-01 DIAGNOSIS — J9611 Chronic respiratory failure with hypoxia: Secondary | ICD-10-CM | POA: Diagnosis not present

## 2021-01-01 DIAGNOSIS — Q893 Situs inversus: Secondary | ICD-10-CM | POA: Diagnosis present

## 2021-01-01 DIAGNOSIS — J4540 Moderate persistent asthma, uncomplicated: Secondary | ICD-10-CM

## 2021-01-01 MED ORDER — BUDESONIDE-FORMOTEROL FUMARATE 160-4.5 MCG/ACT IN AERO: 2.0000 | INHALATION_SPRAY | Freq: Two times a day (BID) | RESPIRATORY_TRACT | 5 refills | Status: DC

## 2021-01-01 MED ORDER — PREDNISONE 10 MG PO TABS: ORAL_TABLET | ORAL | 0 refills | Status: DC

## 2021-01-01 MED ORDER — ALBUTEROL SULFATE (2.5 MG/3ML) 0.083% IN NEBU: 2.5000 mg | INHALATION_SOLUTION | RESPIRATORY_TRACT | 12 refills | Status: AC | PRN

## 2021-01-01 MED ORDER — AMOXICILLIN-POT CLAVULANATE 875-125 MG PO TABS: 1.0000 | ORAL_TABLET | Freq: Two times a day (BID) | ORAL | 0 refills | Status: AC

## 2021-01-01 MED ORDER — ALBUTEROL SULFATE HFA 108 (90 BASE) MCG/ACT IN AERS: 2.0000 | INHALATION_SPRAY | RESPIRATORY_TRACT | 2 refills | Status: DC | PRN

## 2021-01-01 NOTE — Assessment & Plan Note (Signed)
Spirometry 03/09/2020  FEV1 3.08 (54%)  Ratio 0.75  And ERV 17% with nl f/v curve    Mild flare assoc with sinusitis > rec augmentin and f/u with his ENT = shoemaker

## 2021-01-01 NOTE — Progress Notes (Signed)
Brett [name], male    DOB: 07-31-1951    MRN: [id number]   Brief patient profile:  83 yowm never smoker with situs inversus "born with asthma" and aware of nasal drainage with breathing problems s/p admit 07/08/2019   Admit date: 07/08/2019 Discharge date: 07/11/2019   Discharge Diagnoses:  Active Problems:   COPD exacerbation ([place])   Acute on chronic respiratory failure (H[facility])   Acute hypoxemic respiratory failure ([facility])   Anemia   Class 1 obesity due to excess calories with body mass index (BMI) of 32.0 to 32.9 in adult      History of present illness:  Brett [name] a 70 y.o.malewith medical history significant forCOPDwithchronic respiratory failure on 3 L O2, Kartagener's syndrome, diastolic CHF, depression. Patient presented to the ED with complaints of 1 week of worsening cough, progressive difficulty breathing. Reports that his O2 sats have been dropping so he has increased his oxygen from 3 L to 4 L. He reports difficulty expectorating sputum, but has hadworsening cough productive of cream-colored sputum, unrelievedeven withdouble doseMucinex. He denies fever or chills, no body aches, no sore throat, no contacts withknownCovid positive patients.  Recent hospitalization9/6 - 9/8acute on chronic respiratory failure, secondary to COPD and decompensated CHF.CTA negative for PE.Patient required IV diuresis with Lasix, steroids, doxycycline.  Hospital Course:  Acute on chronic hypoxemic respiratory failure in the setting of COPD exacerbation -Patient has history of Kartagener syndrome and situs inversus -Wears 3 L nasal cannula oxygen at home. -Follows with Dr. Luan Pulling in the outpatient setting,willconsider consultation as needed -Continueoral antibiotics to complete course -Resume home inhaler/nebulizer regimen; patient will complete steroids tapering. -Speaking in full sentences, with good oxygen saturation on chronic supplementation. -For  upper airway congestion will use loratadine and Flonase. -Covid testing negative  Class I obesity -Body mass index is 32.92 kg/m. -Calorie diet, portion control and increase physical activity discussed with patient.  Acute anemia -stable hemoglobin at discharge. -Iron studies with significant deficiency; status post IVFeraheme -No bright red blood per rectum or melanotic stools appreciated. -Patient discharge will need Ferrex daily -Outpatient follow-up with gastroenterology service for further endoscopy/colonoscopy evaluation. -Hemoglobin on repeat CBChas remainedstable -Continue PPI.  Diastolic CHF -Last echocardiogram 03/2018 EF 60-65% with indeterminate diastolic function -Continue home Lasix as scheduled -Chronic and compensated -Continue to check daily weights and follow low-sodium diet.  Hypertension -stable -Continue home losartan and metoprolol -Advised to follow heart healthy diet  Depression -Continue Cymbalta -No suicidal ideation or hallucinations.  Diabetes mellitus with neuropathy -Resume home hypoglycemic regimen and closely follow CBGs and A1c as an outpatient. -Resume gabapentin for diabetic neuropathy -SPECT increased CBGs in the setting of his steroids usage.          History of Present Illness  03/09/2020  Pulmonary/ 1st [name] eval/Brett [name]   J& J   02/23/2020  Chief Complaint  Patient presents with  . Pulmonary Consult    Former pt of Dr Luan Pulling. Pt states had pleural effusion March 2020. He has been on o2 24/7 x 2 years- states he has "sinus issues".    Dyspnea:  Mostly house bound but constantly moving around the house on 02  Cough: rattle /min slt yellow/ better now / has flutter valve/ mucinex helps Sleep: cpap per neurology  SABA use: not using  02  2lpm 24/7 does not titrate rec Kartagener's syndrome causes bronchiecatasis which is your maint problem, not copd Symicort 160 up to every 12 hours as needed if cough/ wheeze/ short  of  breath.  Work on inhaler technique:  Only use your albuterol as a rescue medication For cough/ congestion > mucinex /mucinex dm  Up to 1200 mg every 12 hours and use the flutter valve as much as you can Make sure you check your oxygen saturations at highest level of activity to be sure it stays over 90% and adjust upward to maintain this level if needed but remember to turn it back to previous settings when you stop (to conserve your supply).  Please schedule a follow up visit in 3 months but call sooner if needed  with all medications /inhalers/ solutions in hand so we can verify exactly what you are taking. 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 Follow-up    Shortness of breath with activity, productive cough with light green phlegm, having issues with O2 dropping when walking  Dyspnea: room to room slt green  Worse x one month  Cough: very hoarse/ congested  Sleeping: recliner at 45 degrees  SABA use: not able to use  / did not bring meds as req  02: 3lpm  Covid status: J/J 02/23/20      No obvious day to day or daytime variability or assoc  hemoptysis or cp or chest tightness, subjective wheeze or overt   hb symptoms.    Also denies any obvious fluctuation of symptoms with weather or environmental changes or other aggravating or alleviating factors except as outlined above   No unusual exposure hx or h/o childhood pna/ asthma or knowledge of premature birth.  Current Allergies, Complete Past Medical History, Past Surgical History, Family History, and Social History were reviewed in [employer] record.  ROS  The following are not active complaints unless  bolded Hoarseness, sore throat, dysphagia, dental problems, itching, sneezing,  nasal congestion or discharge of excess mucus or purulent secretions, ear ache,   fever, chills, sweats, unintended wt loss or wt gain, classically pleuritic or exertional cp,  orthopnea pnd or arm/hand swelling  or leg swelling, presyncope, palpitations, abdominal pain, anorexia, nausea, vomiting, diarrhea  or change in bowel habits or change in bladder habits, change in stools or change in urine, dysuria, hematuria,  rash, arthralgias, visual complaints, headache, numbness, weakness or ataxia or problems with walking or coordination,  change in mood or  memory.        Current Meds  Medication Sig  . albuterol (PROVENTIL) (2.5 MG/3ML) 0.083% nebulizer solution Take 3 mLs (2.5 mg total) by nebulization every 4 (four) hours as needed for wheezing or shortness of breath.  Marland Kitchen amoxicillin-clavulanate (AUGMENTIN) 875-125 MG tablet Take 1 tablet by mouth 2 (two) times daily for 10 days.  Marland Kitchen aspirin EC 81 MG tablet Take 81 mg by mouth daily.  Marland Kitchen bismuth subsalicylate (PEPTO-BISMOL) 262 MG/15ML suspension Take 30 mLs by mouth as needed.  . calcium-vitamin D (OSCAL WITH D) 500-200 MG-UNIT tablet Take 1 tablet by mouth.  . cetirizine (ZYRTEC) 10 MG tablet Take 10 mg by mouth daily.  . Cinnamon 500 MG capsule Take 2,000 mg by mouth daily.   Marland Kitchen co-enzyme Q-10 30 MG capsule Take 30 mg by mouth 3 (three) times daily.  . DULoxetine (CYMBALTA)  60 MG capsule Take 60 mg by mouth daily.  . fenofibrate (TRICOR) 145 MG tablet Take 145 mg by mouth every evening.   . ferrous sulfate 325 (65 FE) MG tablet Take 325-650 mg by mouth See admin instructions. Take two tablets on Monday Wednesday, Friday. And 1 tablet on all other days  . folic acid (FOLVITE) 1 MG tablet Take 1 mg by mouth daily.  . furosemide (LASIX) 40 MG tablet Take 1.5 tablets (60 mg total) by mouth 2 (two) times daily.  Marland Kitchen gabapentin (NEURONTIN) 600 MG tablet Take 600 mg by mouth 3  (three) times daily.  Marland Kitchen guaiFENesin (MUCINEX) 600 MG 12 hr tablet Take 600 mg by mouth 1 day or 1 dose.  Marland Kitchen HYDROcodone-acetaminophen (NORCO) 5-325 MG tablet Take 1-2 tablets by mouth 3 (three) times daily as needed.  . hydrOXYzine (ATARAX/VISTARIL) 10 MG tablet   . LEVEMIR FLEXTOUCH 100 UNIT/ML FlexPen SMARTSIG:40 Unit(s) SUB-Q Daily  . LINZESS 145 MCG CAPS capsule TAKE ONE CAPSULE BY MOUTH ONCE DAILY BEFORE BREAKFAST.  Marland Kitchen losartan (COZAAR) 100 MG tablet Take 100 mg by mouth daily.  . metFORMIN (GLUCOPHAGE) 1000 MG tablet Take 1,000 mg by mouth 2 (two) times daily with a meal.  . metoprolol tartrate (LOPRESSOR) 25 MG tablet TAKE (1) TABLET BY MOUTH TWICE DAILY.  . Multiple Vitamin (MULTIVITAMIN) tablet Take 1 tablet by mouth daily.  . mupirocin ointment (BACTROBAN) 2 % Apply to affected area 3 times daily  . ondansetron (ZOFRAN) 4 MG tablet Take 1-2 tablets (4-8 mg total) by mouth every 8 (eight) hours as needed for nausea or vomiting. (Patient taking differently: Take 4-8 mg by mouth as needed for nausea or vomiting.)  . pantoprazole (PROTONIX) 40 MG tablet TAKE 1 TABLET EVERY DAY  . pioglitazone (ACTOS) 45 MG tablet Take 45 mg by mouth daily.  . predniSONE (DELTASONE) 10 MG tablet Take  4 each am x 2 days,   2 each am x 2 days,  1 each am x 2 days and stop  . pregabalin (LYRICA) 150 MG capsule Take 150 mg by mouth 3 (three) times daily.  Marland Kitchen rOPINIRole (REQUIP) 2 MG tablet Take 2 mg by mouth at bedtime.   . rosuvastatin (CRESTOR) 40 MG tablet Take 40 mg by mouth every evening.   . sitaGLIPtin (JANUVIA) 50 MG tablet Take 50 mg by mouth daily.   . traMADol (ULTRAM) 50 MG tablet Take 50-100 mg by mouth every 6 (six) hours as needed for moderate pain.   . traZODone (DESYREL) 150 MG tablet Take 150 mg by mouth at bedtime.  . [ ]  albuterol (PROAIR HFA) 108 (90 Base) MCG/ACT inhaler Inhale 2 puffs into the lungs every 4 (four) hours as needed for wheezing or shortness of breath. (Patient taking  differently: Inhale 2 puffs into the lungs as needed for wheezing or shortness of breath.)  . [  budesonide-formoterol (SYMBICORT) 160-4.5 MCG/ACT inhaler Inhale 2 puffs into the lungs 2 (two) times daily.              Past Medical History:  Diagnosis Date  . Chronic diastolic heart failure ([place])   . COPD (chronic obstructive pulmonary disease) ([place])   . Depression   . Dextrocardia   . Diabetes mellitus   . GERD (gastroesophageal reflux disease)   . H. pylori infection 11/09/2012   treated with pylera  . HLD (hyperlipidemia)   . HTN (hypertension)    Cholesterol  . Iron deficiency anemia, unspecified 10/18/2012  . Neuropathy   .  Pneumonia   . Situs inversus totalis   . Tachycardia        Objective:      Wt Readings from Last 3 Encounters:  01/01/21 234 lb 12.8 oz (106.5 kg)  11/09/20 215 lb 8 oz (97.8 kg)  09/08/20 219 lb (99.3 kg)      Vital signs reviewed  01/01/2021  - Note at rest 02 sats  97% on 3lpm    General appearance:    Hoarse amb wm nad   HEENT : pt wearing mask not removed for exam due to covid - 19 concerns.   NECK :  without JVD/Nodes/TM/ nl carotid upstrokes bilaterally   LUNGS: no acc muscle use,  Min barrel  contour chest wall with bilateral insp/exp rhonchi  and  without cough on insp or exp maneuvers and min  Hyperresonant  to  percussion bilaterally     CV:  RRR  no s3 or murmur or increase in P2, and no edema   ABD:  soft and nontender with pos end  insp Hoover's  in the supine position. No bruits or organomegaly appreciated, bowel sounds nl  MS:   Nl gait/  ext warm without deformities, calf tenderness, cyanosis or clubbing No obvious joint restrictions   SKIN: warm and dry without lesions    NEURO:  alert, approp, nl sensorium with  no motor or cerebellar deficits apparent.      CXR PA and Lateral: 01/01/2021  :    I personally reviewed images and   impression as follows:    situs inversus with increased markings bases            Assessment

## 2021-01-01 NOTE — Patient Instructions (Addendum)
Plan A = Automatic = Always=    Symbicort 160 Take 2 puffs first thing in am and then another 2 puffs about 12 hours later.    Work on inhaler technique:  relax and gently blow all the way out then take a nice smooth deep breath back in, triggering the inhaler at same time you start breathing in.  Hold for up to 5 seconds if you can. Blow out thru nose. Rinse and gargle with water when done     Plan B = Backup (to supplement plan A, not to replace it) Only use your albuterol inhaler as a rescue medication to be used if you can't catch your breath by resting or doing a relaxed purse lip breathing pattern.  - The less you use it, the better it will work when you need it. - Ok to use the inhaler up to 2 puffs  every 4 hours if you must but call for appointment if use goes up over your usual need - Don't leave home without it !!  (think of it like the spare tire for your car)   Plan C = Crisis (instead of Plan B but only if Plan B stops working) - only use your albuterol nebulizer if you first try Plan B and it fails to help > ok to use the nebulizer up to every 4 hours but if start needing it regularly call for immediate appointment   Prednisone 10 mg take  4 each am x 2 days,   2 each am x 2 days,  1 each am x 2 days and stop   Augmentin 875 mg take one pill twice daily  X 10 days - take at breakfast and supper with large glass of water.  It would help reduce the usual side effects (diarrhea and yeast infections) if you ate cultured yogurt at lunch.   For cough > mucinex or mucinex dm up to 1200 mg every 12 hours as needed and the flutter valve   Make sure you check your oxygen saturation  at your highest level of activity  to be sure it stays over 90% and adjust  02 flow upward to maintain this level if needed but remember to turn it back to previous settings when you stop (to conserve your supply).   Please remember to go to the  x-ray department  @  [facility] for your tests - we  will call you with the results when they are available      You need to see your ENT doctor > Brett [name] re voice and ear problem  Please schedule a follow up office visit in 4 weeks, sooner if needed  with all medications /inhalers/ solutions/devices  in hand so we can verify exactly what you are taking. This includes all medications from all doctors and over the counters

## 2021-01-01 NOTE — Assessment & Plan Note (Addendum)
Mild flare/ doubt able to use hfa correctly but did not bring device for teaching as recommended so :  ABC plan reviewed with symbicort 160  For now as A , albuterol hfa as B and added Albuterol neb as C  Prednisone 10 mg take  4 each am x 2 days,   2 each am x 2 days,  1 each am x 2 days and stop   - The proper method of use, as well as anticipated side effects, of a metered-dose inhaler were discussed and demonstrated to the patient using teach back method.    Return in 4 weeks with all meds in hand using a trust but verify approach to confirm accurate Medication  Reconciliation The principal here is that until we are certain that the  patients are doing what we've asked, it makes no sense to ask them to do more.

## 2021-01-01 NOTE — Assessment & Plan Note (Signed)
Advised:  Make sure you check your oxygen saturation  at your highest level of activity  to be sure it stays over 90% and adjust  02 flow upward to maintain this level if needed but remember to turn it back to previous settings when you stop (to conserve your supply).           Each maintenance medication was reviewed in detail including emphasizing most importantly the difference between maintenance and prns and under what circumstances the prns are to be triggered using an action plan format where appropriate.  Total time for H and P, chart review, counseling, reviewing hfa/02 device(s) and generating customized AVS unique to this office visit / same day charting > 30 min

## 2021-01-05 ENCOUNTER — Telehealth: Payer: Self-pay

## 2021-01-05 ENCOUNTER — Other Ambulatory Visit: Payer: Self-pay

## 2021-01-05 DIAGNOSIS — J9611 Chronic respiratory failure with hypoxia: Secondary | ICD-10-CM

## 2021-01-05 NOTE — Progress Notes (Signed)
Called and went over xray results with patient's wife, Suanne Marker per [place]. All questions answered and Suanne Marker expressed full understanding of results. Suanne Marker stated patients oxygen is still dropping into the 80's (around 82% wife thinks) while walking even with oxygen turned all the way up to 5 Liters (max). Wife states it USED to drop into the 70's.  Wife states oxygen comes back up into the 90's when patient sits back down.  But otherwise wife states patient seems like he's doing better. Will route to Dr Melvyn Novas to make aware.

## 2021-01-12 ENCOUNTER — Other Ambulatory Visit (HOSPITAL_COMMUNITY): Payer: Self-pay | Admitting: Family Medicine

## 2021-01-12 ENCOUNTER — Ambulatory Visit (HOSPITAL_COMMUNITY)
Admission: RE | Admit: 2021-01-12 | Discharge: 2021-01-12 | Disposition: A | Discharge status: Home / Self Care | Payer: Medicare HMO | Source: Ambulatory Visit | Attending: Family Medicine | Admitting: Family Medicine

## 2021-01-12 ENCOUNTER — Other Ambulatory Visit: Payer: Self-pay

## 2021-01-12 DIAGNOSIS — M25562 Pain in left knee: Secondary | ICD-10-CM

## 2021-01-15 ENCOUNTER — Other Ambulatory Visit: Payer: Self-pay

## 2021-01-15 ENCOUNTER — Ambulatory Visit: Payer: Medicare HMO | Admitting: Nurse Practitioner

## 2021-01-15 ENCOUNTER — Encounter: Payer: Self-pay | Admitting: Nurse Practitioner

## 2021-01-15 VITALS — BP 110/60 | HR 66 | Wt 240.0 lb

## 2021-01-15 DIAGNOSIS — I2510 Atherosclerotic heart disease of native coronary artery without angina pectoris: Secondary | ICD-10-CM

## 2021-01-15 DIAGNOSIS — I10 Essential (primary) hypertension: Secondary | ICD-10-CM

## 2021-01-15 DIAGNOSIS — I5033 Acute on chronic diastolic (congestive) heart failure: Secondary | ICD-10-CM

## 2021-01-15 DIAGNOSIS — Z79899 Other long term (current) drug therapy: Secondary | ICD-10-CM

## 2021-01-15 DIAGNOSIS — E785 Hyperlipidemia, unspecified: Secondary | ICD-10-CM | POA: Diagnosis not present

## 2021-01-15 DIAGNOSIS — E118 Type 2 diabetes mellitus with unspecified complications: Secondary | ICD-10-CM

## 2021-01-15 MED ORDER — TORSEMIDE 20 MG PO TABS: 40.0000 mg | ORAL_TABLET | Freq: Two times a day (BID) | ORAL | 11 refills | Status: DC

## 2021-01-15 NOTE — Progress Notes (Signed)
Office Visit    Patient Name: Brett [name] Date of Encounter: 01/15/2021  Primary Care Provider:  [name], MD Primary Cardiologist:  Carlyle Dolly, MD  Chief Complaint    70 year old male with a history of dextrocardia, situs inversus, CAD, HFpEF, hypertension, hyperlipidemia, diabetes, COPD, asthma, restrictive lung disease, and GERD, presents for follow-up related to HFpEF.  Past Medical History    Past Medical History:  Diagnosis Date  . (HFpEF) heart failure with preserved ejection fraction ([place])    a. 03/2018 Echo: EF 60-65%.  Marland Kitchen CAD (coronary artery disease)    a. 05/2018 Cor CTA: LM nl, LAD moderate stenosis (abnl FFR of 0.76), LCX nl, RCA mixed plaque distally w/o significant stenosis. Ca2+ = 81 (51st %'ile)-->Med rx.  Marland Kitchen COPD (chronic obstructive pulmonary disease) ([place])   . Depression   . Dextrocardia   . Diabetes mellitus   . GERD (gastroesophageal reflux disease)   . H. pylori infection 11/09/2012   treated with pylera; no H. pylori on EGD in April 2021  . HLD (hyperlipidemia)   . HTN (hypertension)    Cholesterol  . Iron deficiency anemia, unspecified 10/18/2012  . Neuropathy   . Pneumonia   . Situs inversus totalis   . Tachycardia    Past Surgical History:  Procedure Laterality Date  . BIOPSY  01/09/2020   Procedure: BIOPSY;  Surgeon: Daneil Dolin, MD;  [facility];  Service: Endoscopy;;  . CARPAL TUNNEL RELEASE Right 02/12/2020   Procedure: RIGHT CARPAL TUNNEL RELEASE;  Surgeon: Leandrew Koyanagi, MD;  Location: [place];  Service: Orthopedics;  Laterality: Right;  . CARPAL TUNNEL RELEASE Left 04/22/2020   Procedure: LEFT CARPAL TUNNEL RELEASE;  Surgeon: Leandrew Koyanagi, MD;  Location: [place];  Service: Orthopedics;  Laterality: Left;  . CATARACT EXTRACTION, BILATERAL  2016  . COLONOSCOPY WITH ESOPHAGOGASTRODUODENOSCOPY (EGD) N/A 11/09/2012   Dr. Gala Romney- EGD-normal esophagus, reversed stomach c/w situs inversus  (with dextrocardia query kartagener syndrome.) gastric erosions. hpylori on bx- treated with pylera. TCS- normal rectum. 1 diminutive polyp in the mid descending segment. 1-17mm polyp in the mid desending segment o/w the remainder of the colonic mucosa appeared normal. tubular adenoma on bx  . COLONOSCOPY WITH PROPOFOL N/A 01/09/2020   Procedure: COLONOSCOPY WITH PROPOFOL;  Surgeon: Daneil Dolin, MD;  Four 3-5 mm polyps in the hepatic flexure and cecum resected and retrieved, otherwise normal exam.  Pathology with tubular adenomas.  Recommended repeat colonoscopy in 3 years.  . ESOPHAGOGASTRODUODENOSCOPY (EGD) WITH PROPOFOL N/A 01/09/2020   Procedure: ESOPHAGOGASTRODUODENOSCOPY (EGD) WITH PROPOFOL;  Surgeon: Daneil Dolin, MD; normal exam.  Negative for H. pylori   . GIVENS CAPSULE STUDY N/A 10/07/2013   no source for anemia or heme positive stool noted  . NECK SURGERY     bone spurs  . 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  In the setting of dyspnea and lower extremity swelling, he underwent echocardiogram in July 2019 which showed an EF of 60-65%.  He later underwent coronary CT angiography in September 2019 with a calcium score of 81 (51st percentile, and moderate LAD stenosis with  an abnormal FFR of 0.76.  Patient was not having any chest pain at the time and he was managed medically.  He has some degree of chronic dyspnea on exertion in the setting of lung disease, which has been managed with inhaler therapy.  He was last seen in cardiology clinic in August 2021, at which time he noted occasional lower  extremity edema but was otherwise doing well without chest pain.    In March 2022, he contacted our office and reported weight gain and was advised to increase his Lasix to 60 mg twice daily.  Renal function remained stable.  Unfortunately, his weight is now up 25 pounds since February and he has continued to have progressive lower extremity edema and increasing abdominal girth, as well as dyspnea.  He notes that he was also diagnosed with pneumonia at some point between March and April for which she was treated with antibiotics and steroids.  He has been on oxygen since then.  He has not been experiencing chest pain and denies palpitations, PND, dizziness, syncope, or early satiety.  Home Medications    Prior to Admission medications   Medication Sig Start Date End Date Taking? Authorizing Provider  albuterol (PROAIR HFA) 108 (90 Base) MCG/ACT inhaler Inhale 2 puffs into the lungs every 4 (four) hours as needed for wheezing or shortness of breath. 01/01/21   Tanda Rockers, MD  albuterol (PROVENTIL) (2.5 MG/3ML) 0.083% nebulizer solution Take 3 mLs (2.5 mg total) by nebulization every 4 (four) hours as needed for wheezing or shortness of breath. 01/01/21   Tanda Rockers, MD  aspirin EC 81 MG tablet Take 81 mg by mouth daily.    [provider]  bismuth subsalicylate (PEPTO-BISMOL) 262 MG/15ML suspension Take 30 mLs by mouth as needed.    [provider]  budesonide-formoterol (SYMBICORT) 160-4.5 MCG/ACT inhaler Inhale 2 puffs into the lungs 2 (two) times daily. 01/01/21   Tanda Rockers, MD  calcium-vitamin D (OSCAL WITH D) 500-200 MG-UNIT tablet Take 1 tablet by mouth.    [provider]  cetirizine (ZYRTEC) 10 MG tablet Take 10 mg by mouth daily.    [provider]  Cinnamon 500 MG capsule Take 2,000 mg by mouth daily.     [provider]  co-enzyme Q-10 30 MG capsule Take 30 mg by mouth 3 (three) times daily.    [provider]  DULoxetine  (CYMBALTA) 60 MG capsule Take 60 mg by mouth daily.    [provider]  fenofibrate (TRICOR) 145 MG tablet Take 145 mg by mouth every evening.  05/28/18   [provider]  ferrous sulfate 325 (65 FE) MG tablet Take 325-650 mg by mouth See admin instructions. Take two tablets on Monday Wednesday, Friday. And 1 tablet on all other days    [provider]  folic acid (FOLVITE) 1 MG tablet Take 1 mg by mouth daily.    [provider]  furosemide (LASIX) 40 MG tablet Take 1.5 tablets (60 mg total) by mouth 2 (two) times daily. 11/27/20   Strader, Fransisco Hertz, PA-C  gabapentin (NEURONTIN) 600 MG tablet Take 600 mg by mouth 3 (three) times daily.    [provider]  guaiFENesin (MUCINEX) 600 MG 12 hr tablet Take 600 mg by mouth 1 day or 1 dose.    [provider]  HYDROcodone-acetaminophen (NORCO) 5-325 MG tablet Take 1-2 tablets by mouth 3 (three) times daily as needed. 04/22/20   Leandrew Koyanagi, MD  hydrOXYzine (ATARAX/VISTARIL) 10 MG tablet  08/02/20   [provider]  LEVEMIR FLEXTOUCH 100 UNIT/ML FlexPen SMARTSIG:40 Unit(s) SUB-Q Daily 09/18/20   [provider]  LINZESS 145 MCG CAPS capsule TAKE ONE CAPSULE BY MOUTH ONCE DAILY BEFORE BREAKFAST. 12/23/20   [name], PA-C  losartan (COZAAR) 100 MG tablet Take 100 mg by mouth daily.    [provider]  metFORMIN (GLUCOPHAGE) 1000 MG tablet Take 1,000 mg by mouth 2 (two) times daily with a meal.    [provider]  metoprolol tartrate (LOPRESSOR) 25 MG tablet TAKE (1) TABLET BY MOUTH TWICE DAILY. 07/23/20   Arnoldo Lenis, MD  Multiple Vitamin (MULTIVITAMIN) tablet Take 1 tablet by mouth daily.    [provider]  mupirocin ointment (BACTROBAN) 2 % Apply to affected area 3 times daily 05/12/20 05/12/21  Aundra Dubin, PA-C  ondansetron (ZOFRAN) 4 MG tablet Take 1-2 tablets (4-8 mg total) by mouth every 8 (eight) hours as needed for nausea or  vomiting. Patient taking differently: Take 4-8 mg by mouth as needed for nausea or vomiting. 02/12/20   Leandrew Koyanagi, MD  pantoprazole (PROTONIX) 40 MG tablet TAKE 1 TABLET EVERY DAY 10/13/20   Erenest Rasher, PA-C  pioglitazone (ACTOS) 45 MG tablet Take 45 mg by mouth daily.    [provider]  predniSONE (DELTASONE) 10 MG tablet Take  4 each am x 2 days,   2 each am x 2 days,  1 each am x 2 days and stop 01/01/21   Tanda Rockers, MD  pregabalin (LYRICA) 150 MG capsule Take 150 mg by mouth 3 (three) times daily. 12/11/20   [provider]  rOPINIRole (REQUIP) 2 MG tablet Take 2 mg by mouth at bedtime.     [provider]  rosuvastatin (CRESTOR) 40 MG tablet Take 40 mg by mouth every evening.  03/22/18   [provider]  sitaGLIPtin (JANUVIA) 50 MG tablet Take 50 mg by mouth daily.     [provider]  traMADol (ULTRAM) 50 MG tablet Take 50-100 mg by mouth every 6 (six) hours as needed for moderate pain.  07/25/18   [provider]  traZODone (DESYREL) 150 MG tablet Take 150 mg by mouth at bedtime. 03/09/20   [provider]    Review of Systems    Over the past month and a half, has been experiencing progressive lower extremity edema, increasing abdominal girth, dyspnea on exertion, orthopnea, and 25 pound weight gain.  He denies chest pain, palpitations, PND, dizziness, syncope, or early satiety.  All other systems reviewed and are otherwise negative except as noted above.  Physical Exam    VS:  BP 110/60   Pulse 66   Wt 240 lb (108.9 kg)   SpO2 97%   BMI 37.03 kg/m  , BMI Body mass index is 37.03 kg/m. GEN: Obese, in no acute distress. HEENT: normal. Neck: Supple, moderately elevated JVP, no carotid bruits, or masses. Cardiac: Dextrocardic.  RRR, no murmurs, rubs, or gallops. No clubbing, cyanosis.  2+ bilateral lower extremity edema to the knees.  Mild edema noted on posterior thighs.  Radials 2+/PT 1+ and equal bilaterally.   Respiratory:  Respirations regular and unlabored, bibasilar crackles. GI: obese, protuberant, semifirm, nontender.  No flank edema.  BS + x 4. MS: no deformity or atrophy. Skin: warm and dry, no rash. Neuro:  Strength and sensation are intact. Psych: Normal affect.  Accessory Clinical Findings  ECG personally reviewed by me today -regular sinus rhythm, 66, right axis deviation, prior lateral infarct- no acute changes.  Lab Results  Component Value Date   WBC 10.4 11/02/2020   HGB 12.3 (L) 11/02/2020   HCT 39.6 11/02/2020   MCV 96.6 11/02/2020   PLT 321 11/02/2020   Lab Results  Component Value Date   CREATININE 1.13 12/15/2020   BUN 15 12/15/2020   NA 134 (L) 12/15/2020   K 4.7 12/15/2020   CL 95 (L) 12/15/2020   CO2 28 12/15/2020   Lab Results  Component Value Date   ALT 18 09/01/2020   AST 24 09/01/2020   ALKPHOS 32 (L) 09/01/2020   BILITOT 0.4 09/01/2020    Lab Results  Component Value Date   HGBA1C 6.4 (H) 05/20/2019    Assessment & Plan    1.  Acute on chronic heart failure with preserved ejection fraction: Patient with a history of heart failure normal LV function by echocardiogram in July 2019.  Over the past month and a half, he has noted increasing lower extremity swelling, abdominal girth, dyspnea on exertion, orthopnea, and oxygen requirement.  He was diagnosed with pneumonia earlier this year requiring treatment with antibiotics and steroids.  Since late March, he has been using Lasix 60 mg twice daily however, his weight is up 25 pounds since February.  He is markedly volume overloaded on examination.  I am switching furosemide to torsemide 40 mg twice daily.  I will arrange for a follow-up echocardiogram and basic metabolic panel next week.  Plan to see him back in 2 weeks.  If he has poor response to torsemide, we discussed that he may require admission for IV diuresis versus the addition of metolazone.  His preference is to avoid hospitalization if at all  possible.  His heart rate and blood pressure are stable and he remains on beta-blocker and ARB therapy.  Pending renal function diuresis, could consider addition of spironolactone and empagliflozin, if not cost prohibitive, to reduce risk of hospitalization.  2.  Coronary artery disease: Patient with coronary CTA in September 2019 notable for moderate LAD stenosis with abnormal FFR.  He has been medically managed since then.  He has not been experiencing any chest pain.  In the setting of worsening heart failure, I will arrange for follow-up echocardiogram.  Continue aspirin, beta-blocker, statin, and ARB therapy.  3.  Essential hypertension: Stable on beta-blocker, ARB, and diuretic.  4.  Hyperlipidemia: Remains on statin therapy with normal LFTs last December.  He has not had recent lipids in our system.  5.  Type 2 diabetes mellitus: Followed by primary care.  Consider addition of empagliflozin in the setting of diastolic heart failure.  6.  COPD/restrictive lung disease: Patient on oxygen since diagnosis of pneumonia earlier this year.  Volume overload clearly playing a role in his dyspnea at this point.  Inhaler therapy per pulmonology.  7.  Disposition: Follow-up basic metabolic panel in 1 week.  Arrange for echocardiogram.  Follow-up in clinic in 1 to 2 weeks to reevaluate response to diuretics.   Murray Hodgkins, NP 01/15/2021, 5:05 PM

## 2021-01-15 NOTE — Patient Instructions (Signed)
Medication Instructions:  Your physician has recommended you make the following change in your medication:    Stop Taking Lasix  Start Taking Torsemide 40 mg ( 2 Tablets ) Two Times Daily   *If you need a refill on your cardiac medications before your next appointment, please call your pharmacy*   Lab Work: Your physician recommends that you return for lab work in: 1 Weeks ( BMET)   If you have labs (blood work) drawn today and your tests are completely normal, you will receive your results only by: Marland Kitchen MyChart Message (if you have MyChart) OR . A paper copy in the mail If you have any lab test that is abnormal or we need to change your treatment, we will call you to review the results.   Testing/Procedures: NONE    Follow-Up: At [facility], you and your health needs are our priority.  As part of our continuing mission to provide you with exceptional heart care, we have created designated Provider Care Teams.  These Care Teams include your primary Cardiologist (physician) and Advanced Practice Providers (APPs -  Physician Assistants and Nurse Practitioners) who all work together to provide you with the care you need, when you need it.  We recommend signing up for the patient portal called "MyChart".  Sign up information is provided on this After Visit Summary.  MyChart is used to connect with patients for Virtual Visits (Telemedicine).  Patients are able to view lab/test results, encounter notes, upcoming appointments, etc.  Non-urgent messages can be sent to your provider as well.   To learn more about what you can do with MyChart, go to [email or web address].    Your next appointment:   1-2 week(s)  The format for your next appointment:   In Person  Provider:   You will see one of the following Advanced Practice Providers on your designated Care Team:    Bernerd Pho, PA-C   Ermalinda Barrios, PA-C    Other Instructions Thank you for choosing [facility]!

## 2021-01-22 ENCOUNTER — Other Ambulatory Visit (HOSPITAL_COMMUNITY)
Admission: RE | Admit: 2021-01-22 | Discharge: 2021-01-22 | Disposition: A | Discharge status: Home / Self Care | Payer: Medicare HMO | Source: Ambulatory Visit | Attending: Nurse Practitioner | Admitting: Nurse Practitioner

## 2021-01-22 DIAGNOSIS — Z79899 Other long term (current) drug therapy: Secondary | ICD-10-CM

## 2021-01-22 LAB — BASIC METABOLIC PANEL
Anion gap: 8 (ref 5–15)
BUN: 22 mg/dL (ref 8–23)
CO2: 30 mmol/L (ref 22–32)
Calcium: 9.4 mg/dL (ref 8.9–10.3)
Chloride: 95 mmol/L — ABNORMAL LOW (ref 98–111)
Creatinine, Ser: 1.05 mg/dL (ref 0.61–1.24)
GFR, Estimated: >60 mL/min (ref >60)
Glucose, Bld: 136 mg/dL — ABNORMAL HIGH (ref 70–99)
Potassium: 5 mmol/L (ref 3.5–5.1)
Sodium: 133 mmol/L — ABNORMAL LOW (ref 135–145)

## 2021-01-25 ENCOUNTER — Ambulatory Visit (HOSPITAL_COMMUNITY)
Admission: RE | Admit: 2021-01-25 | Discharge: 2021-01-25 | Disposition: A | Discharge status: Home / Self Care | Payer: Medicare HMO | Source: Ambulatory Visit | Attending: Internal Medicine | Admitting: Internal Medicine

## 2021-01-25 ENCOUNTER — Other Ambulatory Visit: Payer: Self-pay

## 2021-01-25 ENCOUNTER — Encounter: Payer: Self-pay | Admitting: Internal Medicine

## 2021-01-25 ENCOUNTER — Ambulatory Visit (INDEPENDENT_AMBULATORY_CARE_PROVIDER_SITE_OTHER): Payer: Medicare HMO | Admitting: Internal Medicine

## 2021-01-25 ENCOUNTER — Ambulatory Visit (HOSPITAL_COMMUNITY)
Admission: RE | Admit: 2021-01-25 | Discharge: 2021-01-25 | Disposition: A | Discharge status: Home / Self Care | Payer: Medicare HMO | Source: Ambulatory Visit | Attending: Nurse Practitioner | Admitting: Nurse Practitioner

## 2021-01-25 DIAGNOSIS — Q893 Situs inversus: Secondary | ICD-10-CM | POA: Insufficient documentation

## 2021-01-25 DIAGNOSIS — I5033 Acute on chronic diastolic (congestive) heart failure: Secondary | ICD-10-CM | POA: Diagnosis not present

## 2021-01-25 DIAGNOSIS — J4540 Moderate persistent asthma, uncomplicated: Secondary | ICD-10-CM

## 2021-01-25 DIAGNOSIS — J9611 Chronic respiratory failure with hypoxia: Secondary | ICD-10-CM

## 2021-01-25 LAB — ECHOCARDIOGRAM COMPLETE
Area-P 1/2: 7.99 cm2
Height: 67.5 in
S' Lateral: 2.8 cm
Weight: 3728 oz

## 2021-01-25 MED ORDER — PREDNISONE 10 MG PO TABS: ORAL_TABLET | ORAL | 0 refills | Status: DC

## 2021-01-25 MED ORDER — AMOXICILLIN-POT CLAVULANATE 875-125 MG PO TABS: 1.0000 | ORAL_TABLET | Freq: Two times a day (BID) | ORAL | 0 refills | Status: AC

## 2021-01-25 NOTE — Assessment & Plan Note (Addendum)
"  born with asthma" 01/25/2021  After extensive coaching inhaler device,  effectiveness =    50% > continue symbicort 160 but use empty symbicort "like a golfer warming up"   He is actually clear on exam today despite very poor ex tol/ noct breathing related to upper airway issues.  Introduced him to action plan = ABC  see avs for instructions unique to this ov   Re saba: I spent extra time with pt today reviewing appropriate use of albuterol for prn use on exertion with the following points: 1) saba is for relief of sob that does not improve by walking a slower pace or resting but rather if the pt does not improve after trying this first. 2) If the pt is convinced, as many are, that saba helps recover from activity faster then it's easy to tell if this is the case by re-challenging : ie stop, take the inhaler, then p 5 minutes try the exact same activity (intensity of workload) that just caused the symptoms and see if they are substantially diminished or not after saba 3) if there is an activity that reproducibly causes the symptoms, try the saba 15 min before the activity on alternate days   If in fact the saba really does help, then fine to continue to use it prn but advised may need to look closer at the maintenance regimen being used to achieve better control of airways disease with exertion.

## 2021-01-25 NOTE — Progress Notes (Signed)
*  PRELIMINARY RESULTS* Echocardiogram 2D Echocardiogram has been performed. Patient has history of Dextrocardia, Kartagener syndrome, Restrictive lung disease.  Samuel Germany 01/25/2021, 1:20 PM

## 2021-01-25 NOTE — Patient Instructions (Addendum)
Augmentin 875 mg take one pill twice daily  X 20 days - take at breakfast and supper with large glass of water.  It would help reduce the usual side effects (diarrhea and yeast infections) if you ate cultured yogurt at lunch.    Prednisone 10 mg take  4 each am x 2 days,   2 each am x 2 days,  1 each am x 2 days and stop    Make sure you check your oxygen saturation  at your highest level of activity  to be sure it stays over 90% and adjust  02 flow upward to maintain this level if needed but remember to turn it back to previous settings when you stop (to conserve your supply).    Work on inhaler technique:  relax and gently blow all the way out then take a nice smooth deep breath back in, triggering the inhaler at same time you start breathing in.  Hold for up to 5 seconds if you can. Blow out thru nose. Rinse and gargle with water when done   Plan A = Automatic = Always=     Symbicort 160 Take 2 puffs first thing in am and then another 2 puffs about 12 hours later.      Plan B = Backup (to supplement plan A, not to replace it) Only use your albuterol inhaler as a rescue medication to be used if you can't catch your breath by resting or doing a relaxed purse lip breathing pattern.  - The less you use it, the better it will work when you need it. - Ok to use the inhaler up to 2 puffs  every 4 hours if you must but call for appointment if use goes up over your usual need - Don't leave home without it !!  (think of it like the spare tire for your car)   Plan C = Crisis (instead of Plan B but only if Plan B stops working) - only use your albuterol nebulizer if you first try Plan B and it fails to help > ok to use the nebulizer up to every 4 hours but if start needing it regularly call for immediate appointment   Also ok Try albuterol 15 min before an activity (on alternating days)  that you know would make you short of breath and see if it makes any difference and if makes none then don't take  albuterol after activity unless you can't catch your breath as this means it's the resting that helps, not the albuterol.   Please schedule a follow up visit in 3 months but call sooner if needed

## 2021-01-25 NOTE — Assessment & Plan Note (Addendum)
Spirometry 03/09/2020  FEV1 3.08 (54%)  Ratio 0.75  And ERV 17% with nl f/v curve    There is a chronic atelectatic area R base medically likely related to prior infection or mucus plugging but no indication for fob at this point > rx goal to improve MC function/ cough mechanics and eliminate sources of contamination of the lower airways, esp via pnds  >>> reviewed advice on use of mucinex / flutter but at present main problem is related to chronic sinusitis (see separate a/p)

## 2021-01-25 NOTE — Progress Notes (Signed)
Brett [name], male    DOB: 07-31-1951    MRN: [id number]   Brief patient profile:  70 yowm never smoker with situs inversus "born with asthma" and aware of nasal drainage with breathing problems s/p admit 07/08/2019   Admit date: 07/08/2019 Discharge date: 07/11/2019   Discharge Diagnoses:  Active Problems:   COPD exacerbation ([place])   Acute on chronic respiratory failure (HCC)   Acute hypoxemic respiratory failure (HCC)   Anemia   Class 1 obesity due to excess calories with body mass index (BMI) of 32.0 to 32.9 in adult      History of present illness:  Brett [name] a 70 y.o.malewith medical history significant forCOPDwithchronic respiratory failure on 3 L O2, Kartagener's syndrome, diastolic CHF, depression. Patient presented to the ED with complaints of 1 week of worsening cough, progressive difficulty breathing. Reports that his O2 sats have been dropping so he has increased his oxygen from 3 L to 4 L. He reports difficulty expectorating sputum, but has hadworsening cough productive of cream-colored sputum, unrelievedeven withdouble doseMucinex. He denies fever or chills, no body aches, no sore throat, no contacts withknownCovid positive patients.  Recent hospitalization9/6 - 9/8acute on chronic respiratory failure, secondary to COPD and decompensated CHF.CTA negative for PE.Patient required IV diuresis with Lasix, steroids, doxycycline.  Hospital Course:  Acute on chronic hypoxemic respiratory failure in the setting of COPD exacerbation -Patient has history of Kartagener syndrome and situs inversus -Wears 3 L nasal cannula oxygen at home. -Follows with Dr. Luan Pulling in the outpatient setting,willconsider consultation as needed -Continueoral antibiotics to complete course -Resume home inhaler/nebulizer regimen; patient will complete steroids tapering. -Speaking in full sentences, with good oxygen saturation on chronic supplementation. -For  upper airway congestion will use loratadine and Flonase. -Covid testing negative  Class I obesity -Body mass index is 32.92 kg/m. -Calorie diet, portion control and increase physical activity discussed with patient.  Acute anemia -stable hemoglobin at discharge. -Iron studies with significant deficiency; status post IVFeraheme -No bright red blood per rectum or melanotic stools appreciated. -Patient discharge will need Ferrex daily -Outpatient follow-up with gastroenterology service for further endoscopy/colonoscopy evaluation. -Hemoglobin on repeat CBChas remainedstable -Continue PPI.  Diastolic CHF -Last echocardiogram 03/2018 EF 60-65% with indeterminate diastolic function -Continue home Lasix as scheduled -Chronic and compensated -Continue to check daily weights and follow low-sodium diet.  Hypertension -stable -Continue home losartan and metoprolol -Advised to follow heart healthy diet  Depression -Continue Cymbalta -No suicidal ideation or hallucinations.  Diabetes mellitus with neuropathy -Resume home hypoglycemic regimen and closely follow CBGs and A1c as an outpatient. -Resume gabapentin for diabetic neuropathy -SPECT increased CBGs in the setting of his steroids usage.          History of Present Illness  03/09/2020  Pulmonary/ 1st office eval/Brett [name]   J& J   02/23/2020  Chief Complaint  Patient presents with  . Pulmonary Consult    Former pt of Dr Luan Pulling. Pt states had pleural effusion March 2020. He has been on o2 24/7 x 2 years- states he has "sinus issues".    Dyspnea:  Mostly house bound but constantly moving around the house on 02  Cough: rattle /min slt yellow/ better now / has flutter valve/ mucinex helps Sleep: cpap per neurology  SABA use: not using  02  2lpm 24/7 does not titrate rec Kartagener's syndrome causes bronchiecatasis which is your maint problem, not copd Symicort 160 up to every 12 hours as needed if cough/ wheeze/ short  of  breath.  Work on inhaler technique:  Only use your albuterol as a rescue medication For cough/ congestion > mucinex /mucinex dm  Up to 1200 mg every 12 hours and use the flutter valve as much as you can Make sure you check your oxygen saturations at highest level of activity to be sure it stays over 90% and adjust upward to maintain this level if needed but remember to turn it back to previous settings when you stop (to conserve your supply).  Please schedule a follow up visit in 3 months but call sooner if needed  with all medications /inhalers/ solutions in hand so we can verify exactly what you are taking. This includes all medications from all doctors and over the counters Add cxr on return        01/01/2021  f/u ov/[place] office/Brett [name] re: Kartagener syndrome with probable bronchiectasis Spirometry 03/09/2020  FEV1 3.08 (54%)  Ratio 0.75  And ERV 17% with nl f/v curve / did not bring  Chief Complaint  Patient presents with  . Follow-up    Shortness of breath with activity, productive cough with light green phlegm, having issues with O2 dropping when walking  Dyspnea: room to room slt green  Worse x one month  Cough: very hoarse/ congested  Sleeping: recliner at 45 degrees  SABA use: not able to use  / did not bring meds as req  02: 3lpm  Covid status: J/J 02/23/20 rec Plan A = Automatic = Always=    Symbicort 160 Take 2 puffs first thing in am and then another 2 puffs about 12 hours later.  Work on inhaler technique:  Plan B = Backup (to supplement plan A, not to replace it) Only use your albuterol inhaler as a rescue medication Plan C = Crisis (instead of Plan B but only if Plan B stops working) - only use your albuterol nebulizer if you first try Plan B  Prednisone 10 mg take  4 each am x 2 days,   2 each am x 2 days,  1 each am x 2 days and stop  Augmentin 875 mg take one pill twice daily  X 10 days  For cough > mucinex or mucinex dm up to 1200 mg every 12 hours as needed  and the flutter valve  Make sure you check your oxygen saturation  at your highest level of activity  to be sure it stays over 90% and adjust  02 flow upward to maintain this level if needed but remember to turn it back to previous settings when you stop (to conserve your supply).   You need to see your ENT doctor > [facility] Cornelia re voice and ear problem > did not do       01/25/2021  f/u ov/[place] office/Brett [name] re: worse than usual since Feb 2022 with lots of nasal congestion some better p 10 d augmentin Chief Complaint  Patient presents with  . Follow-up    Breathing has improved since his last visit. He states still has difficulty with breathing when he gets in a hurry.   Dyspnea:  Room to room even on 02 but not titrating Cough:  Mostly clear now but still very stuffy nose  Sleeping: [place] does sleep / has not seen since worse sob / reports poor tolerance of cpap  SABA use: neb tid not prn, poor hfa  02: 3-4 lpm  Covid status:  Covid Jan 2021  And J/J April 2022     No obvious day to day  or daytime variability or assoc excess/ purulent sputum or mucus plugs or hemoptysis or cp or chest tightness, subjective wheeze or overt  hb symptoms.     Also denies any obvious fluctuation of symptoms with weather or environmental changes or other aggravating or alleviating factors except as outlined above   No unusual exposure hx or h/o childhood pna  or knowledge of premature birth.  Current Allergies, Complete Past Medical History, Past Surgical History, Family History, and Social History were reviewed in [employer] record.  ROS  The following are not active complaints unless bolded Hoarseness, sore throat, dysphagia, dental problems, itching, sneezing,  nasal congestion or discharge of excess mucus or purulent secretions, ear ache,   fever, chills, sweats, unintended wt loss or wt gain, classically pleuritic or exertional cp,  orthopnea pnd or arm/hand swelling  or leg  swelling, presyncope, palpitations, abdominal pain, anorexia, nausea, vomiting, diarrhea  or change in bowel habits or change in bladder habits, change in stools or change in urine, dysuria, hematuria,  rash, arthralgias, visual complaints, headache, numbness, weakness or ataxia or problems with walking or coordination,  change in mood or  memory.        Current Meds  Medication Sig  . albuterol (PROAIR HFA) 108 (90 Base) MCG/ACT inhaler Inhale 2 puffs into the lungs every 4 (four) hours as needed for wheezing or shortness of breath.  Marland Kitchen albuterol (PROVENTIL) (2.5 MG/3ML) 0.083% nebulizer solution Take 3 mLs (2.5 mg total) by nebulization every 4 (four) hours as needed for wheezing or shortness of breath.  Marland Kitchen aspirin EC 81 MG tablet Take 81 mg by mouth daily.  Marland Kitchen bismuth subsalicylate (PEPTO-BISMOL) 262 MG/15ML suspension Take 30 mLs by mouth as needed.  . budesonide-formoterol (SYMBICORT) 160-4.5 MCG/ACT inhaler Inhale 2 puffs into the lungs 2 (two) times daily.  . calcium-vitamin D (OSCAL WITH D) 500-200 MG-UNIT tablet Take 1 tablet by mouth.  . cetirizine (ZYRTEC) 10 MG tablet Take 10 mg by mouth daily.  . Cinnamon 500 MG capsule Take 2,000 mg by mouth daily.   Marland Kitchen co-enzyme Q-10 30 MG capsule Take 30 mg by mouth 3 (three) times daily.  . DULoxetine (CYMBALTA) 60 MG capsule Take 60 mg by mouth daily.  . fenofibrate (TRICOR) 145 MG tablet Take 145 mg by mouth every evening.   . ferrous sulfate 325 (65 FE) MG tablet Take 325-650 mg by mouth See admin instructions. Take two tablets on Monday Wednesday, Friday. And 1 tablet on all other days  . folic acid (FOLVITE) 1 MG tablet Take 1 mg by mouth daily.  Marland Kitchen gabapentin (NEURONTIN) 600 MG tablet Take 600 mg by mouth 3 (three) times daily.  Marland Kitchen guaiFENesin (MUCINEX) 600 MG 12 hr tablet Take 600 mg by mouth 1 day or 1 dose.  Marland Kitchen HYDROcodone-acetaminophen (NORCO) 5-325 MG tablet Take 1-2 tablets by mouth 3 (three) times daily as needed.  . hydrOXYzine  (ATARAX/VISTARIL) 10 MG tablet Take 10 mg by mouth every 8 (eight) hours as needed.  Marland Kitchen LEVEMIR FLEXTOUCH 100 UNIT/ML FlexPen Inject 20 Units into the skin at bedtime.  Marland Kitchen LINZESS 145 MCG CAPS capsule TAKE ONE CAPSULE BY MOUTH ONCE DAILY BEFORE BREAKFAST.  Marland Kitchen losartan (COZAAR) 100 MG tablet Take 100 mg by mouth daily.  . metFORMIN (GLUCOPHAGE) 1000 MG tablet Take 1,000 mg by mouth 2 (two) times daily with a meal.  . metoprolol tartrate (LOPRESSOR) 25 MG tablet TAKE (1) TABLET BY MOUTH TWICE DAILY.  . Multiple Vitamin (MULTIVITAMIN) tablet  Take 1 tablet by mouth daily.  . mupirocin ointment (BACTROBAN) 2 % Apply to affected area 3 times daily  . ondansetron (ZOFRAN) 4 MG tablet Take 1-2 tablets (4-8 mg total) by mouth every 8 (eight) hours as needed for nausea or vomiting. (Patient taking differently: Take 4-8 mg by mouth as needed for nausea or vomiting.)  . pantoprazole (PROTONIX) 40 MG tablet TAKE 1 TABLET EVERY DAY  . pioglitazone (ACTOS) 45 MG tablet Take 45 mg by mouth daily.  . pregabalin (LYRICA) 150 MG capsule Take 150 mg by mouth 3 (three) times daily.  Marland Kitchen rOPINIRole (REQUIP) 2 MG tablet Take 2 mg by mouth at bedtime.   . rosuvastatin (CRESTOR) 40 MG tablet Take 40 mg by mouth every evening.   . sitaGLIPtin (JANUVIA) 50 MG tablet Take 50 mg by mouth daily.   Marland Kitchen torsemide (DEMADEX) 20 MG tablet Take 2 tablets (40 mg total) by mouth 2 (two) times daily.  . traMADol (ULTRAM) 50 MG tablet Take 50-100 mg by mouth every 6 (six) hours as needed for moderate pain.   . traZODone (DESYREL) 150 MG tablet Take 150 mg by mouth at bedtime.               Past Medical History:  Diagnosis Date  . Chronic diastolic heart failure ([place])   . COPD (chronic obstructive pulmonary disease) ([place])   . Depression   . Dextrocardia   . Diabetes mellitus   . GERD (gastroesophageal reflux disease)   . H. pylori infection 11/09/2012   treated with pylera  . HLD (hyperlipidemia)   . HTN (hypertension)     Cholesterol  . Iron deficiency anemia, unspecified 10/18/2012  . Neuropathy   . Pneumonia   . Situs inversus totalis   . Tachycardia        Objective:      01/25/2021        233  01/01/21 234 lb 12.8 oz (106.5 kg)  11/09/20 215 lb 8 oz (97.8 kg)  09/08/20 219 lb (99.3 kg)      Vital signs reviewed  01/25/2021  - Note at rest 02 sats  97% on 3lpm    General appearance:    Obese amb wm very hoarse/ prominent pseudowheeze    HEENT : pt wearing mask not removed for exam due to covid - 19 concerns.   NECK :  without JVD/Nodes/TM/ nl carotid upstrokes bilaterally   LUNGS: no acc muscle use,  Min barrel  contour chest wall with bilateral  slightly decreased bs s audible wheeze and  without cough on insp or exp maneuvers and min  Hyperresonant  to  percussion bilaterally     CV:  RRR  no s3 or murmur or increase in P2, and no edema   ABD:  Quite obese soft and nontender with pos end  insp Hoover's  in the supine position. No bruits or organomegaly appreciated, bowel sounds nl  MS:   Nl gait/  ext warm without deformities, calf tenderness, cyanosis or clubbing No obvious joint restrictions   SKIN: warm and dry without lesions    NEURO:  alert, approp, nl sensorium with  no motor or cerebellar deficits apparent.    CXR PA and Lateral:   01/25/2021 :    I personally reviewed images and agree with radiology impression as follows:   Opacity at the base of the right lung persists, present on the abdominal CT 08/12/2020, CT chest 05/19/2019, and the prior chest x-ray.  Assessment

## 2021-01-25 NOTE — Assessment & Plan Note (Addendum)
01/25/2021  Each lap 100 ft/ very awkward gait  walked 1/2 lap sats dropped to 88%4lpm--increased o2 to 5lpm and sats increased to 92%-walked another 1/2 lap and sats decreased to 85%5lpm- walked another lap and had to increase o2 to 8lpm- sats at end 89% 8lpm   Each maintenance medication was reviewed in detail including emphasizing most importantly the difference between maintenance and prns and under what circumstances the prns are to be triggered using an action plan format where appropriate.  Each maintenance medication was reviewed in detail including emphasizing most importantly the difference between maintenance and prns and under what circumstances the prns are to be triggered using an action plan format where appropriate.  Total time for H and P, chart review, counseling, reviewing flutter device(s) , directly observing portions of ambulatory 02 saturation study/ and generating customized AVS unique to this office visit / same day charting > 30 min

## 2021-01-26 ENCOUNTER — Encounter: Payer: Self-pay | Admitting: *Deleted

## 2021-01-26 ENCOUNTER — Encounter: Payer: Self-pay | Admitting: Internal Medicine

## 2021-01-28 NOTE — Progress Notes (Signed)
Cardiology Office Note    Date:  01/29/2021   ID:  Brett [name], DOB 1950-10-01, MRN [id number]  PCP:  [name], MD  Cardiologist:  Carlyle Dolly, MD  Electrophysiologist:  None   Chief Complaint: f/u CHF  History of Present Illness:   Brett [name] is a 70 y.o. 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   He established care with cardiology in 2019 in the setting of dyspnea and lower extremity swelling. 2D echo showed EF 60-65% otherwise unrevealing, technically difficult. He later underwent coronary CT angiography in September 2019 with a calcium score of 81 (51st percentile, and moderate LAD stenosis with an abnormal FFR of 0.76. Patient was not having any chest pain at the time and he was managed medically. He has some degree of chronic dyspnea on exertion in the setting of lung disease. He has been managed by our office for recent weight gain, LEE, increasing abdominal girth and worsening DOE. He had been diagnosed with pneumonia at some point between March and April for which he was treated with antibiotics and steroids, requiring home O2 since then. He saw Ignacia Bayley, NP who switched Lasix to torsemide 40mg  BID. 2D echo 01/25/21 showed dextrocardia, EF 60-65%, indeterminate diastolic parameters, mild-mod LAE, normal RV, trivial pericardial effusion (the latter unlikely to be of clinical significance).  He returns for follow-up today feeling significantly better. His edema and worsening DOE have completely resolved and he feels back to baseline. He states, "I just love that fluid pill." No chest pain. He is noted to be tachycardic in the 100-120 range today, EKG c/w sinus tach. Wife states he ran out of metoprolol about a week ago. She also reports they have been  out of several other medicines like his Januvia and fenofibrate. She relays that multiple medications are very costly for him and they do not think his PCP office has any assistance program available at this time.   Labwork independently reviewed: 01/22/21 K 5.0, Cr 1.05 10/2020 Hgb 12.3 08/2020 LFTs ok  Past Medical History:  Diagnosis Date  . (HFpEF) heart failure with preserved ejection fraction ([place])    a. 03/2018 Echo: EF 60-65%.  Marland Kitchen CAD (coronary artery disease)    a. 05/2018 Cor CTA: LM nl, LAD moderate stenosis (abnl FFR of 0.76), LCX nl, RCA mixed plaque distally w/o significant stenosis. Ca2+ = 81 (51st %'ile)-->Med rx.  Marland Kitchen COPD (chronic obstructive pulmonary disease) ([place])   . Depression   . Dextrocardia   . Diabetes mellitus   . GERD (gastroesophageal reflux disease)   . H. pylori infection 11/09/2012   treated with pylera; no H. pylori on EGD in April 2021  . HLD (hyperlipidemia)   . HTN (hypertension)    Cholesterol  . Iron deficiency anemia, unspecified 10/18/2012  . Neuropathy   . Pneumonia   . Situs inversus totalis   . Tachycardia     Past Surgical History:  Procedure Laterality Date  . BIOPSY  01/09/2020   Procedure: BIOPSY;  Surgeon: Daneil Dolin, MD;  [facility];  Service: Endoscopy;;  . CARPAL TUNNEL RELEASE Right 02/12/2020   Procedure: RIGHT CARPAL TUNNEL RELEASE;  Surgeon: Leandrew Koyanagi, MD;  Location: [place];  Service: Orthopedics;  Laterality: Right;  . CARPAL TUNNEL RELEASE Left 04/22/2020   Procedure:  LEFT CARPAL TUNNEL RELEASE;  Surgeon: Leandrew Koyanagi, MD;  Location: [place];  Service: Orthopedics;  Laterality: Left;  . CATARACT EXTRACTION, BILATERAL  2016  . COLONOSCOPY WITH ESOPHAGOGASTRODUODENOSCOPY (EGD) N/A 11/09/2012   Dr. 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 hpylori on bx- treated with pylera. TCS- normal  rectum. 1 diminutive polyp in the mid descending segment. 1-51mm polyp in the mid desending segment o/w the remainder of the colonic mucosa appeared normal. tubular adenoma on bx  . COLONOSCOPY WITH PROPOFOL N/A 01/09/2020   Procedure: COLONOSCOPY WITH PROPOFOL;  Surgeon: Daneil Dolin, MD;  Four 3-5 mm polyps in the hepatic flexure and cecum resected and retrieved, otherwise normal exam.  Pathology with tubular adenomas.  Recommended repeat colonoscopy in 3 years.  . ESOPHAGOGASTRODUODENOSCOPY (EGD) WITH PROPOFOL N/A 01/09/2020   Procedure: ESOPHAGOGASTRODUODENOSCOPY (EGD) WITH PROPOFOL;  Surgeon: Daneil Dolin, MD; normal exam.  Negative for H. pylori   . GIVENS CAPSULE STUDY N/A 10/07/2013   no source for anemia or heme positive stool noted  . NECK SURGERY     bone spurs  . POLYPECTOMY  01/09/2020   Procedure: POLYPECTOMY;  Surgeon: Daneil Dolin, MD;  [facility];  Service: Endoscopy;;    Current Medications: Current Meds  Medication Sig  . albuterol (PROAIR HFA) 108 (90 Base) MCG/ACT inhaler Inhale 2 puffs into the lungs every 4 (four) hours as needed for wheezing or shortness of breath.  Marland Kitchen albuterol (PROVENTIL) (2.5 MG/3ML) 0.083% nebulizer solution Take 3 mLs (2.5 mg total) by nebulization every 4 (four) hours as needed for wheezing or shortness of breath.  Marland Kitchen amoxicillin-clavulanate (AUGMENTIN) 875-125 MG tablet Take 1 tablet by mouth 2 (two) times daily for 20 days.  Marland Kitchen aspirin EC 81 MG tablet Take 81 mg by mouth daily.  Marland Kitchen bismuth subsalicylate (PEPTO-BISMOL) 262 MG/15ML suspension Take 30 mLs by mouth as needed.  . budesonide-formoterol (SYMBICORT) 160-4.5 MCG/ACT inhaler Inhale 2 puffs into the lungs 2 (two) times daily.  . calcium-vitamin D (OSCAL WITH D) 500-200 MG-UNIT tablet Take 1 tablet by mouth.  . cetirizine (ZYRTEC) 10 MG tablet Take 10 mg by mouth daily.  . Cinnamon 500 MG capsule Take 2,000 mg by mouth daily.   Marland Kitchen co-enzyme Q-10 30 MG capsule Take 30 mg by  mouth 3 (three) times daily.  . DULoxetine (CYMBALTA) 60 MG capsule Take 60 mg by mouth daily.  . fenofibrate (TRICOR) 145 MG tablet Take 145 mg by mouth every evening.   . ferrous sulfate 325 (65 FE) MG tablet Take 325-650 mg by mouth See admin instructions. Take two tablets on Monday Wednesday, Friday. And 1 tablet on all other days  . folic acid (FOLVITE) 1 MG tablet Take 1 mg by mouth daily.  Marland Kitchen gabapentin (NEURONTIN) 600 MG tablet Take 600 mg by mouth 3 (three) times daily.  Marland Kitchen guaiFENesin (MUCINEX) 600 MG 12 hr tablet Take 600 mg by mouth 1 day or 1 dose.  Marland Kitchen HYDROcodone-acetaminophen (NORCO) 5-325 MG tablet Take 1-2 tablets by mouth 3 (three) times daily as needed.  . hydrOXYzine (ATARAX/VISTARIL) 10 MG tablet Take 10 mg by mouth every 8 (eight) hours as needed.  Marland Kitchen LEVEMIR FLEXTOUCH 100 UNIT/ML FlexPen Inject 20 Units into the skin at bedtime.  Marland Kitchen LINZESS 145 MCG CAPS capsule TAKE ONE CAPSULE BY MOUTH ONCE DAILY BEFORE BREAKFAST.  Marland Kitchen losartan (COZAAR) 100 MG tablet Take 100 mg by mouth daily.  Marland Kitchen  metFORMIN (GLUCOPHAGE) 1000 MG tablet Take 1,000 mg by mouth 2 (two) times daily with a meal.  . metoprolol tartrate (LOPRESSOR) 25 MG tablet TAKE (1) TABLET BY MOUTH TWICE DAILY.  . Multiple Vitamin (MULTIVITAMIN) tablet Take 1 tablet by mouth daily.  . mupirocin ointment (BACTROBAN) 2 % Apply to affected area 3 times daily  . ondansetron (ZOFRAN) 4 MG tablet Take 1-2 tablets (4-8 mg total) by mouth every 8 (eight) hours as needed for nausea or vomiting. (Patient taking differently: Take 4-8 mg by mouth as needed for nausea or vomiting.)  . pantoprazole (PROTONIX) 40 MG tablet TAKE 1 TABLET EVERY DAY  . predniSONE (DELTASONE) 10 MG tablet Take  4 each am x 2 days,   2 each am x 2 days,  1 each am x 2 days and stop  . pregabalin (LYRICA) 150 MG capsule Take 150 mg by mouth 3 (three) times daily.  Marland Kitchen rOPINIRole (REQUIP) 2 MG tablet Take 2 mg by mouth at bedtime.   . rosuvastatin (CRESTOR) 40 MG tablet  Take 40 mg by mouth every evening.   . sitaGLIPtin (JANUVIA) 50 MG tablet Take 50 mg by mouth daily.   Marland Kitchen torsemide (DEMADEX) 20 MG tablet Take 2 tablets (40 mg total) by mouth 2 (two) times daily.  . traMADol (ULTRAM) 50 MG tablet Take 50-100 mg by mouth every 6 (six) hours as needed for moderate pain.   . traZODone (DESYREL) 150 MG tablet Take 150 mg by mouth at bedtime.  . [DISCONTINUED] pioglitazone (ACTOS) 45 MG tablet Take 45 mg by mouth daily.    Allergies:   Patient has no known allergies.   Social History   Socioeconomic History  . Marital status: Married    Spouse name: Not on file  . Number of children: 0  . Years of education: 7th grade   . Highest education level: Not on file  Occupational History  . Occupation: Biomedical scientist   Tobacco Use  . Smoking status: Never Smoker  . Smokeless tobacco: Current User    Types: Chew  Vaping Use  . Vaping Use: Never used  Substance and Sexual Activity  . Alcohol use: No  . Drug use: No  . Sexual activity: Yes  Other Topics Concern  . Not on file  Social History Narrative  . Not on file   Social Determinants of Health   Financial Resource Strain: Low Risk   . Difficulty of Paying Living Expenses: Not hard at all  Food Insecurity: No Food Insecurity  . Worried About Charity fundraiser in the Last Year: Never true  . Ran Out of Food in the Last Year: Never true  Transportation Needs: No Transportation Needs  . Lack of Transportation (Medical): No  . Lack of Transportation (Non-Medical): No  Physical Activity: Sufficiently Active  . Days of Exercise per Week: 5 days  . Minutes of Exercise per Session: 30 min  Stress: No Stress Concern Present  . Feeling of Stress : Not at all  Social Connections: Socially Isolated  . Frequency of Communication with Friends and Family: Once a week  . Frequency of Social Gatherings with Friends and Family: Once a week  . Attends Religious Services: Never  . Active Member of Clubs or  Organizations: No  . Attends Archivist Meetings: Never  . Marital Status: Married     Family History:  The patient's family history includes Arthritis in his sister; Asthma in his sister; Diabetes in his sister; Heart attack  in his mother; Heart defect in his sister; Kidney disease in his sister; Pancreatitis in his sister. There is no history of Colon cancer.  ROS:   Please see the history of present illness.  All other systems are reviewed and otherwise negative.    EKGs/Labs/Other Studies Reviewed:    Studies reviewed are outlined and summarized above. Reports included below if pertinent.  2D Echo 01/2021 1. Dextrocardia. Left ventricular ejection fraction, by estimation, is 60  to 65%. The left ventricle has normal function. The left ventricle has no  regional wall motion abnormalities. Left ventricular diastolic parameters  are indeterminate.  2. Right ventricular systolic function is normal. The right ventricular  size is normal.  3. Left atrial size was mild to moderately dilated.  4. The pericardial effusion is posterior to the left ventricle and  lateral to the left ventricle.  5. The mitral valve is degenerative. No evidence of mitral valve  regurgitation. No evidence of mitral stenosis. Severe mitral annular  calcification.  6. The aortic valve is tricuspid. Aortic valve regurgitation is not  visualized. Mild to moderate aortic valve sclerosis/calcification is  present, without any evidence of aortic stenosis.  7. The inferior vena cava is normal in size with greater than 50%  respiratory variability, suggesting right atrial pressure of 3 mmHg. 1. Dextrocardia. Left ventricular ejection fraction, by estimation, is 60  to 65%. The left ventricle has normal function. The left ventricle has no  regional wall motion abnormalities. Left ventricular diastolic parameters  are indeterminate.  2. Right ventricular systolic function is normal. The right  ventricular  size is normal.  3. Left atrial size was mild to moderately dilated.  4. The pericardial effusion is posterior to the left ventricle and  lateral to the left ventricle.  5. The mitral valve is degenerative. No evidence of mitral valve  regurgitation. No evidence of mitral stenosis. Severe mitral annular  calcification.  6. The aortic valve is tricuspid. Aortic valve regurgitation is not  visualized. Mild to moderate aortic valve sclerosis/calcification is  present, without any evidence of aortic stenosis.  7. The inferior vena cava is normal in size with greater than 50%  respiratory variability, suggesting right atrial pressure of 3 mmHg.     EKG:  EKG is ordered today, personally reviewed, demonstrating sinus tachycardia 121bpm, nonspecific TW flattening inferiorly but otherwise no acute STT changes  Recent Labs: 09/01/2020: ALT 18 11/02/2020: Hemoglobin 12.3; Platelets 321 01/22/2021: BUN 22; Creatinine, Ser 1.05; Potassium 5.0; Sodium 133  Recent Lipid Panel No results found for: CHOL, TRIG, HDL, CHOLHDL, VLDL, LDLCALC, LDLDIRECT  PHYSICAL EXAM:    VS:  BP 130/66   Pulse (!) 120   Ht 5' 7.5" (1.715 m)   Wt 223 lb (101.2 kg)   SpO2 98%   BMI 34.41 kg/m   BMI: Body mass index is 34.41 kg/m.  GEN: Well nourished, well developed male in no acute distress, on O2 HEENT: normocephalic, atraumatic, + strabismus Neck: no JVD, carotid bruits, or masses Cardiac: RRR; no murmurs, rubs, or gallops, no edema  Respiratory:  clear to auscultation bilaterally, normal work of breathing GI: soft, nontender, nondistended, + BS MS: no deformity or atrophy, requires cane for gait assistance Skin: warm and dry, no rash Neuro:  Alert and Oriented x 3, Strength and sensation are intact, follows commands Psych: euthymic mood, full affect, jovial  Wt Readings from Last 3 Encounters:  01/29/21 223 lb (101.2 kg)  01/25/21 233 lb (105.7 kg)  01/15/21 240  lb (108.9 kg)      ASSESSMENT & PLAN:   1. Chronic diastolic CHF - patient reports dramatic improvement with recent med change to torsemide. His weight remains a few lb about prior dry weight (~219) but he appears euvolemic. We will continue present dose of torsemide. Given uptrending potassium by last labs, will obtain repeat BMET today. Given his prior hx of hyperkalemia per lab review, would hold off on additino of spironolactone at this time. He is noted to be on pioglitazone which can increase potential for fluid retention in heart failure patients. I asked him to hold this and discuss further DM management with his primary care provider. Wife notes that he previously was told they may reduce his regimen anyway as he periodically has to eat a "honey bun" to bring his blood sugar up. I considered SGLT2i today but wife reports they are already struggling to afford the copays for the regimen he does have on board. I will send a message to our social work team to see if they have any options for assistance.  2. Sinus tachycardia - patient ran out of metoprolol 1 week ago which is likely contributing. I have recommended obtaining a TSH, BMET, CBC today for completeness. (Prior history of Iron deficiency anemia, no recent thyroid on file.) Recommended to restart metoprolol - rx sent in. They were advised to call if he notices HR still running 100 or higher at home. HR was 60s last OV here.  3. CAD - no recent angina. Dyspnea improved with diuresis. Continue medical therapy to include ASA, BB, statin. Lipids are managed in primary care. Would suggest aiming for goal LDL <70.  4. Essential HTN - controlled, follow in context of BB resumption.  5. Iron deficiency anemia - recheck CBC. No bleeding reported.  6. Restrictive lung disease - remains on O2 ever since hospitalization earlier this year. Continue f/u pulmonology as directed.  Disposition: F/u with Dr. Harl Bowie in 6 weeks (mainly recheck HR and ensure volume status  remains stable -> if stable at that time can likely go back to usual f/u timetable).  Medication Adjustments/Labs and Tests Ordered: Current medicines are reviewed at length with the patient today.  Concerns regarding medicines are outlined above. Medication changes, Labs and Tests ordered today are summarized above and listed in the Patient Instructions accessible in Encounters.    Signed, Charlie Pitter, PA-C  01/29/2021 3:11 PM    [facility] Location in [place]. [place], [place] [address] Ph: [phone]; Fax [phone]

## 2021-01-29 ENCOUNTER — Ambulatory Visit: Payer: Medicare HMO | Admitting: Internal Medicine

## 2021-01-29 ENCOUNTER — Other Ambulatory Visit (HOSPITAL_COMMUNITY)
Admission: RE | Admit: 2021-01-29 | Discharge: 2021-01-29 | Disposition: A | Discharge status: Home / Self Care | Payer: Medicare HMO | Source: Ambulatory Visit | Attending: Physician Assistant | Admitting: Physician Assistant

## 2021-01-29 ENCOUNTER — Encounter: Payer: Self-pay | Admitting: Physician Assistant

## 2021-01-29 ENCOUNTER — Ambulatory Visit: Payer: Medicare HMO | Admitting: Physician Assistant

## 2021-01-29 ENCOUNTER — Other Ambulatory Visit: Payer: Self-pay

## 2021-01-29 VITALS — BP 130/66 | HR 120 | Ht 67.5 in | Wt 223.0 lb

## 2021-01-29 DIAGNOSIS — R000 Tachycardia, unspecified: Secondary | ICD-10-CM | POA: Diagnosis not present

## 2021-01-29 DIAGNOSIS — I2510 Atherosclerotic heart disease of native coronary artery without angina pectoris: Secondary | ICD-10-CM

## 2021-01-29 DIAGNOSIS — I5033 Acute on chronic diastolic (congestive) heart failure: Secondary | ICD-10-CM

## 2021-01-29 DIAGNOSIS — D509 Iron deficiency anemia, unspecified: Secondary | ICD-10-CM | POA: Insufficient documentation

## 2021-01-29 DIAGNOSIS — I5032 Chronic diastolic (congestive) heart failure: Secondary | ICD-10-CM

## 2021-01-29 DIAGNOSIS — J984 Other disorders of lung: Secondary | ICD-10-CM

## 2021-01-29 DIAGNOSIS — I10 Essential (primary) hypertension: Secondary | ICD-10-CM

## 2021-01-29 DIAGNOSIS — E785 Hyperlipidemia, unspecified: Secondary | ICD-10-CM

## 2021-01-29 LAB — BASIC METABOLIC PANEL
Anion gap: 12 (ref 5–15)
BUN: 24 mg/dL — ABNORMAL HIGH (ref 8–23)
CO2: 30 mmol/L (ref 22–32)
Calcium: 10 mg/dL (ref 8.9–10.3)
Chloride: 93 mmol/L — ABNORMAL LOW (ref 98–111)
Creatinine, Ser: 0.86 mg/dL (ref 0.61–1.24)
GFR, Estimated: >60 mL/min (ref >60)
Glucose, Bld: 179 mg/dL — ABNORMAL HIGH (ref 70–99)
Potassium: 4.1 mmol/L (ref 3.5–5.1)
Sodium: 135 mmol/L (ref 135–145)

## 2021-01-29 LAB — CBC
HCT: 37.1 % — ABNORMAL LOW (ref 39.0–52.0)
Hemoglobin: 11.8 g/dL — ABNORMAL LOW (ref 13.0–17.0)
MCH: 30 pg (ref 26.0–34.0)
MCHC: 31.8 g/dL (ref 30.0–36.0)
MCV: 94.4 fL (ref 80.0–100.0)
Platelets: 422 10*3/uL — ABNORMAL HIGH (ref 150–400)
RBC: 3.93 MIL/uL — ABNORMAL LOW (ref 4.22–5.81)
RDW: 15.8 % — ABNORMAL HIGH (ref 11.5–15.5)
WBC: 11.1 10*3/uL — ABNORMAL HIGH (ref 4.0–10.5)
nRBC: 0 % (ref 0.0–0.2)

## 2021-01-29 LAB — TSH: TSH: 0.423 u[IU]/mL (ref 0.350–4.500)

## 2021-01-29 MED ORDER — METOPROLOL TARTRATE 25 MG PO TABS: ORAL_TABLET | ORAL | 3 refills | Status: DC

## 2021-01-29 NOTE — Patient Instructions (Signed)
Medication Instructions:  Your physician has recommended you make the following change in your medication:  STOP Actos- Please let your primary care provider know we recommended they consider an alternative to Actos, as it is not a great medication for patients with heart failure.   *If you need a refill on your cardiac medications before your next appointment, please call your pharmacy*   Lab Work: BMET/CBC/TSH If you have labs (blood work) drawn today and your tests are completely normal, you will receive your results only by: Marland Kitchen MyChart Message (if you have MyChart) OR . A paper copy in the mail If you have any lab test that is abnormal or we need to change your treatment, we will call you to review the results.   Testing/Procedures: None   Follow-Up: At [facility], you and your health needs are our priority.  As part of our continuing mission to provide you with exceptional heart care, we have created designated Provider Care Teams.  These Care Teams include your primary Cardiologist (physician) and Advanced Practice Providers (APPs -  Physician Assistants and Nurse Practitioners) who all work together to provide you with the care you need, when you need it.  We recommend signing up for the patient portal called "MyChart".  Sign up information is provided on this After Visit Summary.  MyChart is used to connect with patients for Virtual Visits (Telemedicine).  Patients are able to view lab/test results, encounter notes, upcoming appointments, etc.  Non-urgent messages can be sent to your provider as well.   To learn more about what you can do with MyChart, go to [email or web address].    Your next appointment:   6 week(s)  The format for your next appointment:   In Person  Provider:   Carlyle Dolly, MD   Other Instructions

## 2021-02-01 ENCOUNTER — Telehealth: Payer: Self-pay | Admitting: Physician Assistant

## 2021-02-01 NOTE — Telephone Encounter (Signed)
    It looks like when he saw Dayna last week he had been without his Metoprolol. Was he able to restart this in the interim? Would verify he is taking 25mg  twice daily. If he just restarted this over the weekend, would continue to monitor HR and BP response for now since his HR was previously well-controlled on this. If HR remains elevated throughout the week, we may need to further titrate Lopressor to 50mg  BID as recent labs were reassuring.   Signed, Erma Heritage, PA-C 02/01/2021, 2:54 PM Pager: [phone]

## 2021-02-01 NOTE — Telephone Encounter (Signed)
Spoke with pt who states that his heart rate has been elevated over the weekend. Pt denies SOB, dizziness, palpitations at this time. Pt states that he was told to call if heart rate becomes elevated.

## 2021-02-01 NOTE — Telephone Encounter (Signed)
Pt notified and states that he is currently taking Metoprolol 25 mg two times daily. He will continue to monitor BP and Hr and notify office if HR is still elevated.

## 2021-02-01 NOTE — Telephone Encounter (Signed)
New message     STAT if HR is under 50 or over 120 (normal HR is 60-100 beats per minute)  1) What is your heart rate? Between 99-114 today  2) Do you have a log of your heart rate readings (document readings)? No   3) Do you have any other symptoms? No symptoms , he was told to call and let you know when it is up

## 2021-02-02 NOTE — Telephone Encounter (Signed)
Noted, agree with plan.

## 2021-02-03 ENCOUNTER — Telehealth: Payer: Self-pay | Admitting: Licensed Clinical Social Worker

## 2021-02-03 NOTE — Telephone Encounter (Signed)
CSW received referral to assist patient with medication costs. CSW contacted patient and message left for return call. Raquel Sarna, [place], [place]

## 2021-02-16 ENCOUNTER — Telehealth: Payer: Self-pay | Admitting: Licensed Clinical Social Worker

## 2021-02-16 NOTE — Telephone Encounter (Signed)
SW attempted to reach patient and wife to discuss financial needs for medications. CSW will await return call. Raquel Sarna, [place], [place]

## 2021-02-22 ENCOUNTER — Telehealth: Payer: Self-pay | Admitting: Cardiology

## 2021-02-22 NOTE — Telephone Encounter (Signed)
Per phone call from pt- he's very short of breath and can't walk 10 steps without having to stop and catch his breath.  Please call [phone]

## 2021-02-22 NOTE — Telephone Encounter (Signed)
Spoke with Pt who states that he is SOB when walking. Pt states that his belly is swollen. Pt reports that he weighed 238 lbs on yesterday and 242 lbs on today. Blood pressure is 114/54 Hr 61 and O2 93%.

## 2021-02-23 NOTE — Telephone Encounter (Signed)
Spoke with wife who states that patient is taking Torsemide 40 mg two times daily. Wife instructed to increase Torsemide to 60 mg two times daily for 2 days. Wife will call back and update office.

## 2021-02-23 NOTE — Telephone Encounter (Signed)
Returned call to pt to verify dose of Torsemide. Pt states that his fixes his medication and is at work at this time.

## 2021-03-02 ENCOUNTER — Inpatient Hospital Stay (HOSPITAL_COMMUNITY): Payer: Medicare HMO | Attending: Hematology

## 2021-03-02 ENCOUNTER — Other Ambulatory Visit: Payer: Self-pay

## 2021-03-02 ENCOUNTER — Telehealth: Payer: Self-pay | Admitting: *Deleted

## 2021-03-02 DIAGNOSIS — D500 Iron deficiency anemia secondary to blood loss (chronic): Secondary | ICD-10-CM

## 2021-03-02 DIAGNOSIS — D509 Iron deficiency anemia, unspecified: Secondary | ICD-10-CM | POA: Insufficient documentation

## 2021-03-02 LAB — CBC WITH DIFFERENTIAL/PLATELET
Abs Immature Granulocytes: 0.08 10*3/uL — ABNORMAL HIGH (ref 0.00–0.07)
Basophils Absolute: 0 10*3/uL (ref 0.0–0.1)
Basophils Relative: 0 %
Eosinophils Absolute: 0.1 10*3/uL (ref 0.0–0.5)
Eosinophils Relative: 1 %
HCT: 36.5 % — ABNORMAL LOW (ref 39.0–52.0)
Hemoglobin: 11.4 g/dL — ABNORMAL LOW (ref 13.0–17.0)
Immature Granulocytes: 1 %
Lymphocytes Relative: 13 %
Lymphs Abs: 1.4 10*3/uL (ref 0.7–4.0)
MCH: 29.8 pg (ref 26.0–34.0)
MCHC: 31.2 g/dL (ref 30.0–36.0)
MCV: 95.5 fL (ref 80.0–100.0)
Monocytes Absolute: 1 10*3/uL (ref 0.1–1.0)
Monocytes Relative: 9 %
Neutro Abs: 8.8 10*3/uL — ABNORMAL HIGH (ref 1.7–7.7)
Neutrophils Relative %: 76 %
Platelets: 364 10*3/uL (ref 150–400)
RBC: 3.82 MIL/uL — ABNORMAL LOW (ref 4.22–5.81)
RDW: 14.4 % (ref 11.5–15.5)
WBC: 11.4 10*3/uL — ABNORMAL HIGH (ref 4.0–10.5)
nRBC: 0 % (ref 0.0–0.2)

## 2021-03-02 LAB — IRON AND TIBC
Iron: 66 ug/dL (ref 45–182)
Saturation Ratios: 11 % — ABNORMAL LOW (ref 17.9–39.5)
TIBC: 589 ug/dL — ABNORMAL HIGH (ref 250–450)
UIBC: 523 ug/dL

## 2021-03-02 LAB — FERRITIN: Ferritin: 486 ng/mL — ABNORMAL HIGH (ref 24–336)

## 2021-03-02 NOTE — Telephone Encounter (Signed)
-----   Message from Arnoldo Lenis, MD sent at 03/02/2021  4:27 PM EDT ----- I thought I had answered yesterday but dont see a documentation. Increase torsemide to 80mg  bid and update [place] Friday   J Branch MD ----- Message ----- From: Berlinda Last, CMA Sent: 03/01/2021  11:51 AM EDT To: Arnoldo Lenis, MD  Pt stated that he is still at 230 lb and base line is normally 210. Pt took torsemide 60mg  bid x3days and went back to taking it normally. Please advise.   Genia Del., CMA [facility]  ----- Message ----- From: Bard Herbert Sent: 03/01/2021  10:19 AM EDT To: Cristi Loron Triage  Patient was told to take 3 fluid pills in the AM / 3 fluid pills in the PM and to follow up with someone in the office on 6/17. Patient weight on 6/19 was 230. Pt would like a call back to advise him what to do next.

## 2021-03-03 NOTE — Telephone Encounter (Signed)
Spoke to pt on 03/01/21 where he verbalized understanding and will update our office on Friday

## 2021-03-09 ENCOUNTER — Inpatient Hospital Stay (HOSPITAL_BASED_OUTPATIENT_CLINIC_OR_DEPARTMENT_OTHER): Payer: Medicare HMO | Admitting: Physician Assistant

## 2021-03-09 ENCOUNTER — Other Ambulatory Visit: Payer: Self-pay

## 2021-03-09 DIAGNOSIS — D509 Iron deficiency anemia, unspecified: Secondary | ICD-10-CM

## 2021-03-09 NOTE — Progress Notes (Addendum)
Virtual Visit via Telephone Note [facility]  I connected with [name] on 03/09/21  at 11:12 AM by telephone and verified that I am speaking with the correct person using two identifiers.  Location: Patient: Home Provider: [facility]   I discussed the limitations, risks, security and privacy concerns of performing an evaluation and management service by telephone and the availability of in person appointments. I also discussed with the patient that there may be a patient responsible charge related to this service. The patient expressed understanding and agreed to proceed.   History of Present Illness: Brett [name]. Brett [name] is contacted today for follow-up of iron deficiency anemia.  He was last seen on 11/09/2020 by Dr. Delton Coombes.  At today's visit, the patient reports feeling fair.  He denies any recent hospitalizations or changes in his baseline health status.  He denies any symptoms of overt bleeding; no gross rectal hemorrhage, melena, hematemesis, epistaxis, or hematuria. He has chronic shortness of breath (secondary to CHF, restrictive lung disease, and COPD), but denies any changes from his baseline status. No chest pain, palpitations, or syncopal episodes. No B symptoms.  He admits to fatigue.  Reports that his energy is 50% with 100% appetite.  He reports that he is eating well to maintain a stable weight at this time.   Observations/Objective: Review of Systems  Constitutional:  Positive for malaise/fatigue. Negative for chills, diaphoresis, fever and weight loss.  Respiratory:  Positive for shortness of breath. Negative for cough.   Cardiovascular:  Negative for chest pain and palpitations.  Gastrointestinal:  Negative for abdominal pain, blood in stool, melena, nausea and vomiting.  Genitourinary:  Positive for dysuria and frequency.  Neurological:  Negative for dizziness and headaches.    PHYSICAL EXAM (per limitations of virtual telephone  visit): The patient is alert and oriented x 3, exhibiting adequate mentation, good mood, and ability to speak in full sentences and execute sound judgement.   ASSESSMENT & PLAN: 1.  Iron deficiency anemia -Hospitalized in October 2020 when Hgb dropped, received Feraheme while hospitalized on 07/09/2019 - FOBT positive for occult blood in 2014 - Colonoscopy (01/09/2020): Significant for 4 subcentimeter polyps in the hepatic flexure and cecum, rest of the exam was normal - EGD (01/09/2020): Normal esophagus, stomach, duodenum - Last Feraheme on 07/01/2020 - Denies any bleeding per rectum or melena - Suspect poor iron absorption in the setting of chronic disease, but cannot rule out slow chronic blood loss - Takes ferrous sulfate 325 mg at home (2 tablets M/W/F, 1 tablet all other days) - Reviewed labs (03/02/2021): Mild anemia with Hgb 11.4, MCV 95.5.  Ferritin elevated at 486 (likely elevated as acute phase reactant), with low iron saturation 11%, elevated TIBC 589, and borderline serum iron 66 - suggests relative iron deficiency - PLAN:  Given elevated TIBC and low iron saturation, patient would benefit from iron supplementation despite elevated ferritin.  We will proceed with IV iron  supplementation (has been on oral iron without improvement).  RTC in 4 months with repeat labs.  2.  Leukocytosis and thrombocytosis, RESOLVED - Previous work-up for myeloproliferative disorders was negative - Counts have mostly normalized (03/02/2021) with mild leukocytosis noted (WBC 11.4, platelets 364) - Likely reactive secondary to morbid obesity and multiple other chronic comorbidities and pro-inflammatory states  3.  Situs inversus totalis - No direct correlation to his anemia although this could be causing a chronic inflammatory state due to primary ciliary dyskinesia   Follow  Up Instructions: - IV Venofer x3 - RTC in 4 months with repeat labs    I discussed the assessment and treatment plan with the  patient. The patient was provided an opportunity to ask questions and all were answered. The patient agreed with the plan and demonstrated an understanding of the instructions.   The patient was advised to call back or seek an in-person evaluation if the symptoms worsen or if the condition fails to improve as anticipated.  I provided 12 minutes of non-face-to-face time during this encounter.   [name], PA-C 03/09/21 11:28 AM

## 2021-03-12 ENCOUNTER — Other Ambulatory Visit: Payer: Self-pay

## 2021-03-12 ENCOUNTER — Inpatient Hospital Stay (HOSPITAL_COMMUNITY): Payer: Medicare HMO | Attending: Hematology and Oncology

## 2021-03-12 ENCOUNTER — Encounter (HOSPITAL_COMMUNITY): Payer: Self-pay

## 2021-03-12 VITALS — BP 116/49 | HR 59 | Temp 97.9°F | Resp 19

## 2021-03-12 DIAGNOSIS — D509 Iron deficiency anemia, unspecified: Secondary | ICD-10-CM | POA: Diagnosis not present

## 2021-03-12 DIAGNOSIS — D500 Iron deficiency anemia secondary to blood loss (chronic): Secondary | ICD-10-CM

## 2021-03-12 MED ORDER — ACETAMINOPHEN 325 MG PO TABS
650.0000 mg | ORAL_TABLET | Freq: Once | ORAL | Status: AC
Start: 2021-03-12 — End: 2021-03-12
  Administered 2021-03-12: 650 mg via ORAL
  Filled 2021-03-12: qty 2

## 2021-03-12 MED ORDER — SODIUM CHLORIDE 0.9 % IV SOLN
Freq: Once | INTRAVENOUS | Status: AC
Start: 2021-03-12 — End: 2021-03-12

## 2021-03-12 MED ORDER — SODIUM CHLORIDE 0.9 % IV SOLN
300.0000 mg | Freq: Once | INTRAVENOUS | Status: AC
  Administered 2021-03-12: 300 mg via INTRAVENOUS
  Filled 2021-03-12: qty 300

## 2021-03-12 MED ORDER — LORATADINE 10 MG PO TABS
10.0000 mg | ORAL_TABLET | Freq: Once | ORAL | Status: AC
  Administered 2021-03-12: 10 mg via ORAL
  Filled 2021-03-12: qty 1

## 2021-03-12 NOTE — Progress Notes (Signed)
Patient tolerated iron infusion with no complaints voiced. Peripheral IV site clean and dry with good blood return noted before and after infusion. Band aid applied. VSS with discharge and left in satisfactory condition via wheelchair, with no s/s of distress noted.

## 2021-03-12 NOTE — Patient Instructions (Signed)
[facility]  Discharge Instructions: Thank you for choosing [facility] to provide your oncology and hematology care.  If you have a lab appointment with the [facility], please come in thru the Main Entrance and check in at the main information desk.  Wear comfortable clothing and clothing appropriate for easy access to any Portacath or PICC line.   We strive to give you quality time with your provider. You may need to reschedule your appointment if you arrive late (15 or more minutes).  Arriving late affects you and other patients whose appointments are after yours.  Also, if you miss three or more appointments without notifying the office, you may be dismissed from the clinic at the provider's discretion.      For prescription refill requests, have your pharmacy contact our office and allow 72 hours for refills to be completed.    Today you received the following: Venofer. Return as scheduled.   To help prevent nausea and vomiting after your treatment, we encourage you to take your nausea medication as directed.  BELOW ARE SYMPTOMS THAT SHOULD BE REPORTED IMMEDIATELY: *FEVER GREATER THAN 100.4 F (38 C) OR HIGHER *CHILLS OR SWEATING *NAUSEA AND VOMITING THAT IS NOT CONTROLLED WITH YOUR NAUSEA MEDICATION *UNUSUAL SHORTNESS OF BREATH *UNUSUAL BRUISING OR BLEEDING *URINARY PROBLEMS (pain or burning when urinating, or frequent urination) *BOWEL PROBLEMS (unusual diarrhea, constipation, pain near the anus) TENDERNESS IN MOUTH AND THROAT WITH OR WITHOUT PRESENCE OF ULCERS (sore throat, sores in mouth, or a toothache) UNUSUAL RASH, SWELLING OR PAIN  UNUSUAL VAGINAL DISCHARGE OR ITCHING   Items with * indicate a potential emergency and should be followed up as soon as possible or go to the Emergency Department if any problems should occur.  Please show the CHEMOTHERAPY ALERT CARD or IMMUNOTHERAPY ALERT CARD at check-in to the Emergency Department and triage  nurse.  Should you have questions after your visit or need to cancel or reschedule your appointment, please contact [facility] [phone]  and follow the prompts.  Office hours are 8:00 a.m. to 4:30 p.m. Monday - Friday. Please note that voicemails left after 4:00 p.m. may not be returned until the following business day.  We are closed weekends and major holidays. You have access to a nurse at all times for urgent questions. Please call the main number to the clinic [phone] and follow the prompts.  For any non-urgent questions, you may also contact your provider using MyChart. We now offer e-Visits for anyone 10 and older to request care online for non-urgent symptoms. For details visit [email or web address].   Also download the MyChart app! Go to the app store, search "MyChart", open the app, select [facility], and log in with your MyChart username and password.  Due to Covid, a mask is required upon entering the hospital/clinic. If you do not have a mask, one will be given to you upon arrival. For doctor visits, patients may have 1 support person aged 31 or older with them. For treatment visits, patients cannot have anyone with them due to current Covid guidelines and our immunocompromised population.

## 2021-03-13 ENCOUNTER — Other Ambulatory Visit: Payer: Self-pay | Admitting: Internal Medicine

## 2021-03-16 ENCOUNTER — Other Ambulatory Visit: Payer: Self-pay

## 2021-03-16 ENCOUNTER — Inpatient Hospital Stay (HOSPITAL_COMMUNITY): Payer: Medicare HMO

## 2021-03-16 VITALS — BP 137/49 | HR 69 | Temp 97.0°F | Resp 18

## 2021-03-16 DIAGNOSIS — D500 Iron deficiency anemia secondary to blood loss (chronic): Secondary | ICD-10-CM

## 2021-03-16 DIAGNOSIS — D509 Iron deficiency anemia, unspecified: Secondary | ICD-10-CM

## 2021-03-16 MED ORDER — SODIUM CHLORIDE 0.9 % IV SOLN: Freq: Once | INTRAVENOUS | Status: AC

## 2021-03-16 MED ORDER — ACETAMINOPHEN 325 MG PO TABS
650.0000 mg | ORAL_TABLET | Freq: Once | ORAL | Status: AC
Start: 2021-03-16 — End: 2021-03-16
  Administered 2021-03-16: 650 mg via ORAL
  Filled 2021-03-16: qty 2

## 2021-03-16 MED ORDER — LORATADINE 10 MG PO TABS
10.0000 mg | ORAL_TABLET | Freq: Once | ORAL | Status: AC
  Administered 2021-03-16: 10 mg via ORAL
  Filled 2021-03-16: qty 1

## 2021-03-16 MED ORDER — SODIUM CHLORIDE 0.9 % IV SOLN
300.0000 mg | Freq: Once | INTRAVENOUS | Status: AC
  Administered 2021-03-16: 300 mg via INTRAVENOUS
  Filled 2021-03-16: qty 300

## 2021-03-16 NOTE — Progress Notes (Signed)
Cardiology Office Note    Date:  03/17/2021   ID:  Brett [name], DOB [birth year], MRN [id number]  PCP:  [name], MD  Cardiologist: Carlyle Dolly, MD    Chief Complaint  Patient presents with   Follow-up    6 week visit    History of Present Illness:    Brett [name] is a 70 y.o. 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  He was examined by Melina Copa, PA-C in 01/2021 for follow-up from a recent visit during which he had been experiencing worsening fluid overload and had been switched from Lasix to Torsemide 40 mg twice daily. He reported feeling significantly better at the time of his visit since being on Torsemide and his weight had declined to 223 lbs (previously 240 lbs in 01/2021).  He was noted to be on Pioglitazone which could increase potential for fluid retention and it was recommended that he hold this and discuss further with his PCP. He was tachycardic but had been without his Metoprolol for over a week, therefore was recommended to restart his prior dose. He did call the office in 02/2021 reporting issues with fluid retention and it was recommended he increase his Torsemide to 60 mg twice daily.  In talking with the patient and his wife today, he feels that his breathing has overall improved over the past few months but has noticed some abdominal distention.His weight has increased by over 15 lbs within the past 2 months. In reviewing his dietary intake, he does not consume high sodium foods but does consume a significant amount of fluid including 5+ bottles of water a day along with 5+ bottles of Coke Zero.  Says his mouth always feels dry due to his  diabetes.  He is on 2 to 3 L nasal cannula at baseline but denies any specific orthopnea or PND.  No recent exertional chest pain or palpitations.   Past Medical History:  Diagnosis Date   (HFpEF) heart failure with preserved ejection fraction ([place])    a. 03/2018 Echo: EF 60-65%.   CAD (coronary artery disease)    a. 05/2018 Cor CTA: LM nl, LAD moderate stenosis (abnl FFR of 0.76), LCX nl, RCA mixed plaque distally w/o significant stenosis. Ca2+ = 81 (51st %'ile)-->Med rx.   COPD (chronic obstructive pulmonary disease) (HCC)    Depression    Dextrocardia    Diabetes mellitus    GERD (gastroesophageal reflux disease)    H. pylori infection 11/09/2012   treated with pylera; no H. pylori on EGD in April 2021   HLD (hyperlipidemia)    HTN (hypertension)    Cholesterol   Iron deficiency anemia, unspecified 10/18/2012   Neuropathy    Pneumonia    Situs inversus totalis    Tachycardia     Past Surgical History:  Procedure Laterality Date   BIOPSY  01/09/2020   Procedure: BIOPSY;  Surgeon: Daneil Dolin, MD;  [facility];  Service: Endoscopy;;   CARPAL TUNNEL RELEASE Right 02/12/2020   Procedure: RIGHT CARPAL TUNNEL RELEASE;  Surgeon: Leandrew Koyanagi, MD;  Location: [facility];  Service: Orthopedics;  Laterality: Right;   CARPAL TUNNEL RELEASE Left 04/22/2020   Procedure: LEFT CARPAL  TUNNEL RELEASE;  Surgeon: Leandrew Koyanagi, MD;  Location: [facility];  Service: Orthopedics;  Laterality: Left;   CATARACT EXTRACTION, BILATERAL  2016   COLONOSCOPY WITH ESOPHAGOGASTRODUODENOSCOPY (EGD) N/A 11/09/2012   Dr. 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 hpylori on bx- treated with pylera. TCS- normal rectum. 1 diminutive polyp in the mid descending segment. 1-37mm polyp in the mid desending segment o/w the remainder of the colonic mucosa appeared normal. tubular adenoma on bx    COLONOSCOPY WITH PROPOFOL N/A 01/09/2020   Procedure: COLONOSCOPY WITH PROPOFOL;  Surgeon: Daneil Dolin, MD;  Four 3-5 mm polyps in the hepatic flexure and cecum resected and retrieved, otherwise normal exam.  Pathology with tubular adenomas.  Recommended repeat colonoscopy in 3 years.   ESOPHAGOGASTRODUODENOSCOPY (EGD) WITH PROPOFOL N/A 01/09/2020   Procedure: ESOPHAGOGASTRODUODENOSCOPY (EGD) WITH PROPOFOL;  Surgeon: Daneil Dolin, MD; normal exam.  Negative for H. pylori    GIVENS CAPSULE STUDY N/A 10/07/2013   no source for anemia or heme positive stool noted   NECK SURGERY     bone spurs   POLYPECTOMY  01/09/2020   Procedure: POLYPECTOMY;  Surgeon: Daneil Dolin, MD;  [facility];  Service: Endoscopy;;    Current Medications: Outpatient Medications Prior to Visit  Medication Sig Dispense Refill   albuterol (PROVENTIL) (2.5 MG/3ML) 0.083% nebulizer solution Take 3 mLs (2.5 mg total) by nebulization every 4 (four) hours as needed for wheezing or shortness of breath. 75 mL 12   albuterol (VENTOLIN HFA) 108 (90 Base) MCG/ACT inhaler INHALE (2) PUFFS INTO THE LUNGS EVERY 4 HOURS AS NEEDED FOR WHEEZING OR SHORTNESS OF BREATH 8.5 g 10   aspirin EC 81 MG tablet Take 81 mg by mouth daily.     bismuth subsalicylate (PEPTO-BISMOL) 262 MG/15ML suspension Take 30 mLs by mouth as needed.     budesonide-formoterol (SYMBICORT) 160-4.5 MCG/ACT inhaler Inhale 2 puffs into the lungs 2 (two) times daily. 1 each 5   calcium-vitamin D (OSCAL WITH D) 500-200 MG-UNIT tablet Take 1 tablet by mouth.     cetirizine (ZYRTEC) 10 MG tablet Take 10 mg by mouth daily.     Cinnamon 500 MG capsule Take 2,000 mg by mouth daily.      co-enzyme Q-10 30 MG capsule Take 30 mg by mouth 3 (three) times daily.     dexlansoprazole (DEXILANT) 60 MG capsule Dexilant 60 mg capsule, delayed release     doxycycline (VIBRAMYCIN) 100 MG capsule doxycycline hyclate 100 mg capsule     DULoxetine (CYMBALTA) 60 MG capsule  Take 60 mg by mouth daily.     fenofibrate (TRICOR) 145 MG tablet Take 145 mg by mouth every evening.      ferrous sulfate 325 (65 FE) MG tablet Take 325-650 mg by mouth See admin instructions. Take two tablets on Monday Wednesday, Friday. And 1 tablet on all other days     fluticasone (FLONASE) 50 MCG/ACT nasal spray Place into the nose.     folic acid (FOLVITE) 1 MG tablet Take 1 mg by mouth daily.     gabapentin (NEURONTIN) 600 MG tablet Take 600 mg by mouth 3 (three) times daily.     guaiFENesin (MUCINEX) 600 MG 12 hr tablet Take 600 mg by mouth 1 day or 1 dose.     HYDROcodone-acetaminophen (NORCO) 5-325 MG tablet Take 1-2 tablets by mouth 3 (three) times daily as needed. 30 tablet 0  hydrOXYzine (ATARAX/VISTARIL) 10 MG tablet Take 10 mg by mouth every 8 (eight) hours as needed.     LEVEMIR FLEXTOUCH 100 UNIT/ML FlexPen Inject 20 Units into the skin at bedtime.     LINZESS 145 MCG CAPS capsule TAKE ONE CAPSULE BY MOUTH ONCE DAILY BEFORE BREAKFAST. 30 capsule 11   losartan (COZAAR) 100 MG tablet Take 100 mg by mouth daily.     metFORMIN (GLUCOPHAGE) 1000 MG tablet Take 1,000 mg by mouth 2 (two) times daily with a meal.     metoprolol tartrate (LOPRESSOR) 25 MG tablet TAKE (1) TABLET BY MOUTH TWICE DAILY. 180 tablet 3   Multiple Vitamin (MULTIVITAMIN) tablet Take 1 tablet by mouth daily.     mupirocin ointment (BACTROBAN) 2 % Apply to affected area 3 times daily 22 g 0   ondansetron (ZOFRAN) 4 MG tablet Take 1-2 tablets (4-8 mg total) by mouth every 8 (eight) hours as needed for nausea or vomiting. 20 tablet 0   pantoprazole (PROTONIX) 40 MG tablet TAKE 1 TABLET EVERY DAY 90 tablet 3   pioglitazone (ACTOS) 45 MG tablet pioglitazone 45 mg tablet     predniSONE (DELTASONE) 10 MG tablet Take  4 each am x 2 days,   2 each am x 2 days,  1 each am x 2 days and stop 14 tablet 0   pregabalin (LYRICA) 150 MG capsule Take 150 mg by mouth 3 (three) times daily.     rOPINIRole (REQUIP) 2 MG tablet Take  2 mg by mouth at bedtime.      rosuvastatin (CRESTOR) 40 MG tablet Take 40 mg by mouth every evening.      sitaGLIPtin (JANUVIA) 50 MG tablet Take 50 mg by mouth daily.      torsemide (DEMADEX) 20 MG tablet Take 2 tablets (40 mg total) by mouth 2 (two) times daily. 120 tablet 11   traMADol (ULTRAM) 50 MG tablet Take 50-100 mg by mouth every 6 (six) hours as needed for moderate pain.      traZODone (DESYREL) 150 MG tablet Take 150 mg by mouth at bedtime.     No facility-administered medications prior to visit.     Allergies:   Patient has no known allergies.   Social History   Socioeconomic History   Marital status: Married    Spouse name: Not on file   Number of children: 0   Years of education: 7th grade    Highest education level: Not on file  Occupational History   Occupation: landscaping   Tobacco Use   Smoking status: Never   Smokeless tobacco: Current    Types: Chew  Vaping Use   Vaping Use: Never used  Substance and Sexual Activity   Alcohol use: No   Drug use: No   Sexual activity: Yes  Other Topics Concern   Not on file  Social History Narrative   Not on file   Social Determinants of Health   Financial Resource Strain: Low Risk    Difficulty of Paying Living Expenses: Not hard at all  Food Insecurity: No Food Insecurity   Worried About Charity fundraiser in the Last Year: Never true   [place] in the Last Year: Never true  Transportation Needs: No Transportation Needs   Lack of Transportation (Medical): No   Lack of Transportation (Non-Medical): No  Physical Activity: Sufficiently Active   Days of Exercise per Week: 5 days   Minutes of Exercise per Session: 30 min  Stress: No  Stress Concern Present   Feeling of Stress : Not at all  Social Connections: Socially Isolated   Frequency of Communication with Friends and Family: Once a week   Frequency of Social Gatherings with Friends and Family: Once a week   Attends Religious Services: Never    Marine scientist or Organizations: No   Attends Music therapist: Never   Marital Status: Married     Family History:  The patient's family history includes Arthritis in his sister; Asthma in his sister; Diabetes in his sister; Heart attack in his mother; Heart defect in his sister; Kidney disease in his sister; Pancreatitis in his sister.   Review of Systems:    Please see the history of present illness.     All other systems reviewed and are otherwise negative except as noted above.   Physical Exam:    VS:  BP 132/64   Pulse 62   Ht 5\' 7"  (1.702 m)   Wt 239 lb (108.4 kg)   SpO2 94%   BMI 37.43 kg/m    General: Well developed, well nourished,male appearing in no acute distress. Head: Normocephalic, atraumatic. Neck: No carotid bruits. JVD not elevated.  Lungs: Respirations regular and unlabored, without wheezes or rales. On 2L [place] at baseline.  Heart: Regular rate and rhythm. No S3 or S4.  No murmur, no rubs, or gallops appreciated. Abdomen: Appears distended. No obvious abdominal masses. Msk:  Strength and tone appear normal for age. No obvious joint deformities or effusions. Extremities: No clubbing or cyanosis. Trace lower extremity edema.  Distal pedal pulses are 2+ bilaterally. Neuro: Alert and oriented X 3. Moves all extremities spontaneously. No focal deficits noted. Psych:  Responds to questions appropriately with a normal affect. Skin: No rashes or lesions noted  Wt Readings from Last 3 Encounters:  03/17/21 239 lb (108.4 kg)  01/29/21 223 lb (101.2 kg)  01/25/21 233 lb (105.7 kg)     Studies/Labs Reviewed:   EKG:  EKG is not ordered today.    Recent Labs: 09/01/2020: ALT 18 01/29/2021: BUN 24; Creatinine, Ser 0.86; Potassium 4.1; Sodium 135; TSH 0.423 03/02/2021: Hemoglobin 11.4; Platelets 364   Lipid Panel No results found for: CHOL, TRIG, HDL, CHOLHDL, VLDL, LDLCALC, LDLDIRECT  Additional studies/ records that were reviewed today  include:    Echocardiogram: 01/2021 IMPRESSIONS     1. Dextrocardia. Left ventricular ejection fraction, by estimation, is 60  to 65%. The left ventricle has normal function. The left ventricle has no  regional wall motion abnormalities. Left ventricular diastolic parameters  are indeterminate.   2. Right ventricular systolic function is normal. The right ventricular  size is normal.   3. Left atrial size was mild to moderately dilated.   4. The pericardial effusion is posterior to the left ventricle and  lateral to the left ventricle.   5. The mitral valve is degenerative. No evidence of mitral valve  regurgitation. No evidence of mitral stenosis. Severe mitral annular  calcification.   6. The aortic valve is tricuspid. Aortic valve regurgitation is not  visualized. Mild to moderate aortic valve sclerosis/calcification is  present, without any evidence of aortic stenosis.   7. The inferior vena cava is normal in size with greater than 50%  respiratory variability, suggesting right atrial pressure of 3 mmHg.   Assessment:    1. Chronic diastolic CHF (congestive heart failure) ([place])   2. Sinus tachycardia   3. CAD in native artery   4. Restrictive lung  disease      Plan:   In order of problems listed above:  1. HFpEF - His weight peaked at 240 lbs in 01/2021 and had declined to 223 lbs at the time of his follow-up visit later that month (had been at 233 lbs four days prior so unsure of accuracy). His weight has increased by an estimated 10-15 lbs since 01/2021 but he reports his dyspnea has improved. No recent orthopnea or PND. He does have abdominal distension. He has been consuming a significant amount of fluids as outlined above and I recommended he gradually reduce his fluid intake to a goal of less than 2L daily. I have asked him to call back with his weight next week and if no improvement, would titrate Torsemide from 40mg  BID to 60mg  BID.   2. Sinus Tachycardia - Now  resolved given resumption of Lopressor. He denies any recent palpitations. Continue Lopressor 25mg  BID.   3. CAD - Prior Coronary CT in 05/2018 showed moderate stenosis along the LAD with abnormal FFR but medical management recommended.  - He has baseline dyspnea on exertion but reports his dyspnea has improved over the past few months. No recent chest pain.  - He remains on ASA 81mg  daily, Crestor 40mg  daily and Lopressor 25mg  BID.   4. Restrictive Lung Disease - He does have known Kartagener's Syndrome with bronchiectasis and is followed by Pulmonology. Remains on 2L [place] at baseline.    Medication Adjustments/Labs and Tests Ordered: Current medicines are reviewed at length with the patient today.  Concerns regarding medicines are outlined above.  Medication changes, Labs and Tests ordered today are listed in the Patient Instructions below. Patient Instructions  Medication Instructions:  Your physician recommends that you continue on your current medications as directed. Please refer to the Current Medication list given to you today.  Call with weight  next week.   *If you need a refill on your cardiac medications before your next appointment, please call your pharmacy*   Lab Work: NONE   If you have labs (blood work) drawn today and your tests are completely normal, you will receive your results only by: [place] (if you have MyChart) OR A paper copy in the mail If you have any lab test that is abnormal or we need to change your treatment, we will call you to review the results.   Testing/Procedures: NONE    Follow-Up: At [facility], you and your health needs are our priority.  As part of our continuing mission to provide you with exceptional heart care, we have created designated Provider Care Teams.  These Care Teams include your primary Cardiologist (physician) and Advanced Practice Providers (APPs -  Physician Assistants and Nurse Practitioners) who all work  together to provide you with the care you need, when you need it.  We recommend signing up for the patient portal called "MyChart".  Sign up information is provided on this After Visit Summary.  MyChart is used to connect with patients for Virtual Visits (Telemedicine).  Patients are able to view lab/test results, encounter notes, upcoming appointments, etc.  Non-urgent messages can be sent to your provider as well.   To learn more about what you can do with MyChart, go to [email or web address].    Your next appointment:   2 month(s)  The format for your next appointment:   In Person  Provider:   Carlyle Dolly, MD or Bernerd Pho, PA-C   Other Instructions Thank you for choosing [facility]!  Signed, Erma Heritage, PA-C  03/17/2021 7:53 PM    [facility] [address] [place], [place] [address] Phone: [phone] Fax: [phone]

## 2021-03-16 NOTE — Patient Instructions (Signed)
[facility]  Discharge Instructions: Thank you for choosing [facility] to provide your oncology and hematology care.  If you have a lab appointment with the [facility], please come in thru the Main Entrance and check in at the main information desk.  Wear comfortable clothing and clothing appropriate for easy access to any Portacath or PICC line.   We strive to give you quality time with your provider. You may need to reschedule your appointment if you arrive late (15 or more minutes).  Arriving late affects you and other patients whose appointments are after yours.  Also, if you miss three or more appointments without notifying the office, you may be dismissed from the clinic at the provider's discretion.      For prescription refill requests, have your pharmacy contact our office and allow 72 hours for refills to be completed.    Today you received Venofer 300mg  IV infusion      BELOW ARE SYMPTOMS THAT SHOULD BE REPORTED IMMEDIATELY: *FEVER GREATER THAN 100.4 F (38 C) OR HIGHER *CHILLS OR SWEATING *NAUSEA AND VOMITING THAT IS NOT CONTROLLED WITH YOUR NAUSEA MEDICATION *UNUSUAL SHORTNESS OF BREATH *UNUSUAL BRUISING OR BLEEDING *URINARY PROBLEMS (pain or burning when urinating, or frequent urination) *BOWEL PROBLEMS (unusual diarrhea, constipation, pain near the anus) TENDERNESS IN MOUTH AND THROAT WITH OR WITHOUT PRESENCE OF ULCERS (sore throat, sores in mouth, or a toothache) UNUSUAL RASH, SWELLING OR PAIN  UNUSUAL VAGINAL DISCHARGE OR ITCHING   Items with * indicate a potential emergency and should be followed up as soon as possible or go to the Emergency Department if any problems should occur.  Please show the CHEMOTHERAPY ALERT CARD or IMMUNOTHERAPY ALERT CARD at check-in to the Emergency Department and triage nurse.  Should you have questions after your visit or need to cancel or reschedule your appointment, please contact [facility]  [phone]  and follow the prompts.  Office hours are 8:00 a.m. to 4:30 p.m. Monday - Friday. Please note that voicemails left after 4:00 p.m. may not be returned until the following business day.  We are closed weekends and major holidays. You have access to a nurse at all times for urgent questions. Please call the main number to the clinic [phone] and follow the prompts.  For any non-urgent questions, you may also contact your provider using MyChart. We now offer e-Visits for anyone 47 and older to request care online for non-urgent symptoms. For details visit [email or web address].   Also download the MyChart app! Go to the app store, search "MyChart", open the app, select [place], and log in with your MyChart username and password.  Due to Covid, a mask is required upon entering the hospital/clinic. If you do not have a mask, one will be given to you upon arrival. For doctor visits, patients may have 1 support person aged 28 or older with them. For treatment visits, patients cannot have anyone with them due to current Covid guidelines and our immunocompromised population.

## 2021-03-16 NOTE — Progress Notes (Signed)
Pt presents today for 300 mg Venofer IV iron infusion. Pt tolerated treatment well without incidence. Pt stable during and after treatment. Pt discharged in satisfactory condition via wheelchair accompanied by  family member. Pt refused to wait the 30 minute wait time per policy.

## 2021-03-17 ENCOUNTER — Ambulatory Visit: Payer: Medicare HMO | Admitting: Student

## 2021-03-17 ENCOUNTER — Encounter: Payer: Self-pay | Admitting: Student

## 2021-03-17 VITALS — BP 132/64 | HR 62 | Ht 67.0 in | Wt 239.0 lb

## 2021-03-17 DIAGNOSIS — J984 Other disorders of lung: Secondary | ICD-10-CM | POA: Diagnosis not present

## 2021-03-17 DIAGNOSIS — R000 Tachycardia, unspecified: Secondary | ICD-10-CM

## 2021-03-17 DIAGNOSIS — I5032 Chronic diastolic (congestive) heart failure: Secondary | ICD-10-CM | POA: Diagnosis not present

## 2021-03-17 DIAGNOSIS — I2510 Atherosclerotic heart disease of native coronary artery without angina pectoris: Secondary | ICD-10-CM | POA: Diagnosis not present

## 2021-03-17 NOTE — Patient Instructions (Signed)
Medication Instructions:  Your physician recommends that you continue on your current medications as directed. Please refer to the Current Medication list given to you today.  Call with weight  next week.   *If you need a refill on your cardiac medications before your next appointment, please call your pharmacy*   Lab Work: NONE   If you have labs (blood work) drawn today and your tests are completely normal, you will receive your results only by: [place] (if you have MyChart) OR A paper copy in the mail If you have any lab test that is abnormal or we need to change your treatment, we will call you to review the results.   Testing/Procedures: NONE    Follow-Up: At [facility], you and your health needs are our priority.  As part of our continuing mission to provide you with exceptional heart care, we have created designated Provider Care Teams.  These Care Teams include your primary Cardiologist (physician) and Advanced Practice Providers (APPs -  Physician Assistants and Nurse Practitioners) who all work together to provide you with the care you need, when you need it.  We recommend signing up for the patient portal called "MyChart".  Sign up information is provided on this After Visit Summary.  MyChart is used to connect with patients for Virtual Visits (Telemedicine).  Patients are able to view lab/test results, encounter notes, upcoming appointments, etc.  Non-urgent messages can be sent to your provider as well.   To learn more about what you can do with MyChart, go to [email or web address].    Your next appointment:   2 month(s)  The format for your next appointment:   In Person  Provider:   Carlyle Dolly, MD or Bernerd Pho, PA-C   Other Instructions Thank you for choosing [facility]!

## 2021-03-19 ENCOUNTER — Other Ambulatory Visit: Payer: Self-pay

## 2021-03-19 ENCOUNTER — Inpatient Hospital Stay (HOSPITAL_COMMUNITY): Payer: Medicare HMO

## 2021-03-19 VITALS — BP 115/57 | HR 60 | Temp 97.0°F | Resp 18

## 2021-03-19 DIAGNOSIS — D509 Iron deficiency anemia, unspecified: Secondary | ICD-10-CM | POA: Diagnosis not present

## 2021-03-19 DIAGNOSIS — D500 Iron deficiency anemia secondary to blood loss (chronic): Secondary | ICD-10-CM

## 2021-03-19 MED ORDER — LORATADINE 10 MG PO TABS
10.0000 mg | ORAL_TABLET | Freq: Once | ORAL | Status: AC
Start: 2021-03-19 — End: 2021-03-19
  Administered 2021-03-19: 10 mg via ORAL

## 2021-03-19 MED ORDER — SODIUM CHLORIDE 0.9 % IV SOLN
300.0000 mg | Freq: Once | INTRAVENOUS | Status: AC
  Administered 2021-03-19: 300 mg via INTRAVENOUS
  Filled 2021-03-19: qty 300

## 2021-03-19 MED ORDER — LORATADINE 10 MG PO TABS
ORAL_TABLET | ORAL | Status: AC
  Filled 2021-03-19: qty 1

## 2021-03-19 MED ORDER — ACETAMINOPHEN 325 MG PO TABS
650.0000 mg | ORAL_TABLET | Freq: Once | ORAL | Status: AC
  Administered 2021-03-19: 650 mg via ORAL

## 2021-03-19 MED ORDER — SODIUM CHLORIDE 0.9 % IV SOLN
Freq: Once | INTRAVENOUS | Status: AC
Start: 2021-03-19 — End: 2021-03-19

## 2021-03-19 MED ORDER — ACETAMINOPHEN 325 MG PO TABS
ORAL_TABLET | ORAL | Status: AC
  Filled 2021-03-19: qty 2

## 2021-03-19 NOTE — Progress Notes (Signed)
Patient presents today for Venofer infusion per providers order.  Vital signs WNL.  Patient states that the iron is working, he feels like he has more energy.  Peripheral IV started and blood return noted pre and post infusion.  Venofer infusion given today per MD orders.  Stable during infusion without adverse affects.  Vital signs stable.  No complaints at this time.  Discharge from clinic via wheelchair in stable condition.  Alert and oriented X 3.  Follow up with [facility] as scheduled.

## 2021-03-19 NOTE — Patient Instructions (Signed)
[place]  Discharge Instructions: Thank you for choosing [facility] to provide your oncology and hematology care.  If you have a lab appointment with the [facility], please come in thru the Main Entrance and check in at the main information desk.  Wear comfortable clothing and clothing appropriate for easy access to any Portacath or PICC line.   We strive to give you quality time with your provider. You may need to reschedule your appointment if you arrive late (15 or more minutes).  Arriving late affects you and other patients whose appointments are after yours.  Also, if you miss three or more appointments without notifying the office, you may be dismissed from the clinic at the provider's discretion.      For prescription refill requests, have your pharmacy contact our office and allow 72 hours for refills to be completed.    Today you received the following chemotherapy and/or immunotherapy agents Venofer infusion      To help prevent nausea and vomiting after your treatment, we encourage you to take your nausea medication as directed.  BELOW ARE SYMPTOMS THAT SHOULD BE REPORTED IMMEDIATELY: *FEVER GREATER THAN 100.4 F (38 C) OR HIGHER *CHILLS OR SWEATING *NAUSEA AND VOMITING THAT IS NOT CONTROLLED WITH YOUR NAUSEA MEDICATION *UNUSUAL SHORTNESS OF BREATH *UNUSUAL BRUISING OR BLEEDING *URINARY PROBLEMS (pain or burning when urinating, or frequent urination) *BOWEL PROBLEMS (unusual diarrhea, constipation, pain near the anus) TENDERNESS IN MOUTH AND THROAT WITH OR WITHOUT PRESENCE OF ULCERS (sore throat, sores in mouth, or a toothache) UNUSUAL RASH, SWELLING OR PAIN  UNUSUAL VAGINAL DISCHARGE OR ITCHING   Items with * indicate a potential emergency and should be followed up as soon as possible or go to the Emergency Department if any problems should occur.  Please show the CHEMOTHERAPY ALERT CARD or IMMUNOTHERAPY ALERT CARD at check-in to the Emergency  Department and triage nurse.  Should you have questions after your visit or need to cancel or reschedule your appointment, please contact [facility] [phone]  and follow the prompts.  Office hours are 8:00 a.m. to 4:30 p.m. Monday - Friday. Please note that voicemails left after 4:00 p.m. may not be returned until the following business day.  We are closed weekends and major holidays. You have access to a nurse at all times for urgent questions. Please call the main number to the clinic [phone] and follow the prompts.  For any non-urgent questions, you may also contact your provider using MyChart. We now offer e-Visits for anyone 35 and older to request care online for non-urgent symptoms. For details visit [email or web address].   Also download the MyChart app! Go to the app store, search "MyChart", open the app, select [facility], and log in with your MyChart username and password.  Due to Covid, a mask is required upon entering the hospital/clinic. If you do not have a mask, one will be given to you upon arrival. For doctor visits, patients may have 1 support person aged 58 or older with them. For treatment visits, patients cannot have anyone with them due to current Covid guidelines and our immunocompromised population.

## 2021-03-22 ENCOUNTER — Other Ambulatory Visit (HOSPITAL_COMMUNITY): Payer: Self-pay | Admitting: Neurology

## 2021-03-22 DIAGNOSIS — M545 Low back pain, unspecified: Secondary | ICD-10-CM

## 2021-03-29 ENCOUNTER — Other Ambulatory Visit: Payer: Self-pay | Admitting: Cardiology

## 2021-04-02 ENCOUNTER — Ambulatory Visit: Payer: Medicare HMO | Admitting: Cardiology

## 2021-04-29 ENCOUNTER — Other Ambulatory Visit (HOSPITAL_COMMUNITY): Payer: Self-pay

## 2021-04-29 DIAGNOSIS — D509 Iron deficiency anemia, unspecified: Secondary | ICD-10-CM

## 2021-04-29 NOTE — Progress Notes (Signed)
[facility] orders placed for CBCd, Iron/TIBC and Ferritin per Dr. Delton Coombes verbal order. Labs faxed to [facility] per patient request.

## 2021-05-10 ENCOUNTER — Other Ambulatory Visit: Payer: Self-pay

## 2021-05-10 ENCOUNTER — Ambulatory Visit: Payer: Medicare HMO | Admitting: Internal Medicine

## 2021-05-10 ENCOUNTER — Encounter: Payer: Self-pay | Admitting: Internal Medicine

## 2021-05-10 VITALS — BP 140/52 | HR 74 | Temp 98.2°F | Ht 67.0 in | Wt 244.0 lb

## 2021-05-10 DIAGNOSIS — Q893 Situs inversus: Secondary | ICD-10-CM

## 2021-05-10 DIAGNOSIS — J9611 Chronic respiratory failure with hypoxia: Secondary | ICD-10-CM | POA: Diagnosis not present

## 2021-05-10 DIAGNOSIS — J4540 Moderate persistent asthma, uncomplicated: Secondary | ICD-10-CM | POA: Diagnosis not present

## 2021-05-10 MED ORDER — DOXYCYCLINE HYCLATE 100 MG PO TABS
100.0000 mg | ORAL_TABLET | Freq: Two times a day (BID) | ORAL | 0 refills | Status: AC
Start: 2021-05-10 — End: ?

## 2021-05-10 MED ORDER — DOXYCYCLINE HYCLATE 100 MG PO TABS: 100.0000 mg | ORAL_TABLET | Freq: Two times a day (BID) | ORAL | 11 refills | Status: AC

## 2021-05-10 MED ORDER — MONTELUKAST SODIUM 10 MG PO TABS: ORAL_TABLET | ORAL | 2 refills | Status: AC

## 2021-05-10 NOTE — Assessment & Plan Note (Signed)
01/25/2021  Each lap 100 ft/ veryawkward gait  walked 1/2 lap sats dropped to 88%4lpm--increased o2 to 5lpm and sats increased to 92%-walked another 1/2 lap and sats decreased to 85%5lpm- walked another lap and had to increase o2 to 8lpm- sats at end 89%8lpm  - Humdify 02 05/10/2021 due to nasal dryness   Advised: Make sure you check your oxygen saturation  at your highest level of activity  to be sure it stays over 90% and adjust  02 flow upward to maintain this level if needed but remember to turn it back to previous settings when you stop (to conserve your supply).

## 2021-05-10 NOTE — Assessment & Plan Note (Signed)
"  born with asthma" 01/25/2021  After extensive coaching inhaler device,  effectiveness =    50% > continue symbicort 160 but use empty symbicort "like a golfer warming up"  - Allergy profile 05/10/2021 >  Eos 0. /  IgE   - 05/10/2021 added singulair trial   - The proper method of use, as well as anticipated side effects, of a metered-dose inhaler were discussed and demonstrated to the patient using teach back method. Improved effectiveness after extensive coaching during this visit to a level of approximately 75 % from a baseline of 25 % > continue symbicort and add singulair as above   Re saba I spent extra time with pt today reviewing appropriate use of albuterol for prn use on exertion with the following points: 1) saba is for relief of sob that does not improve by walking a slower pace or resting but rather if the pt does not improve after trying this first. 2) If the pt is convinced, as many are, that saba helps recover from activity faster then it's easy to tell if this is the case by re-challenging : ie stop, take the inhaler, then p 5 minutes try the exact same activity (intensity of workload) that just caused the symptoms and see if they are substantially diminished or not after saba 3) if there is an activity that reproducibly causes the symptoms, try the saba 15 min before the activity on alternate days   If in fact the saba really does help, then fine to continue to use it prn but advised may need to look closer at the maintenance regimen being used to achieve better control of airways disease with exertion.

## 2021-05-10 NOTE — Assessment & Plan Note (Signed)
Spirometry 03/09/2020  FEV1 3.08 (54%)  Ratio 0.75  And ERV 17% with nl f/v curve   - doxy x 7 d cycles added 05/10/2021   Advised: Bronchiectasis =   you have scarring of your bronchial tubes which means that they don't function perfectly normally and mucus tends to pool in certain areas of your lung which can cause pneumonia and further scarring of your lung and bronchial tubes  Whenever you develop cough congestion take mucinex or mucinex dm > these will help keep the mucus loose and flowing but if your condition worsens you need to seek help immediately preferably here or somewhere inside the [facility] system to compare xrays ( worse = darker or bloody mucus or pain on breathing in)    >>> prn doxy x 7 days and f/u  q 6 m         Each maintenance medication was reviewed in detail including emphasizing most importantly the difference between maintenance and prns and under what circumstances the prns are to be triggered using an action plan format where appropriate.  Total time for H and P, chart review, counseling, reviewing hfa/02 device(s) and generating customized AVS unique to this office visit / same day charting > 14mn

## 2021-05-10 NOTE — Progress Notes (Signed)
Brett [name], male    DOB: 1950-12-30    MRN: [id number]   Brief patient profile:  44 yowm never smoker with situs inversus "born with asthma" and aware of nasal drainage with breathing problems s/p admit 07/08/2019   Admit date: 07/08/2019 Discharge date: 07/11/2019    Discharge Diagnoses:  Active Problems:   COPD exacerbation (HCC)   Acute on chronic respiratory failure (HCC)   Acute hypoxemic respiratory failure (HCC)   Anemia   Class 1 obesity due to excess calories with body mass index (BMI) of 32.0 to 32.9 in adult         History of present illness:  Brett [name] is a 70 y.o. 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  Patient presented to the ED with complaints of 1 week of worsening cough, progressive difficulty breathing.  Reports that his O2 sats have been dropping so he has increased his oxygen from 3 L to 4 L.  He reports difficulty expectorating sputum, but has had worsening cough productive of cream-colored sputum, unrelieved even with double dose Mucinex. He denies fever or chills, no body aches, no sore throat, no contacts with known Covid positive patients.   Recent hospitalization 9/6 - 9/8 acute on chronic respiratory failure, secondary to COPD and decompensated CHF.  CTA negative for PE.  Patient required IV diuresis with Lasix, steroids, doxycycline.    Hospital Course:  Acute on chronic hypoxemic respiratory failure in the setting of COPD exacerbation -Patient has history of Kartagener syndrome and situs inversus -Wears 3 L nasal cannula oxygen at home. -Follows with Dr. Luan Pulling in the outpatient setting, will consider consultation as needed -Continue oral antibiotics to complete course -Resume home inhaler/nebulizer regimen; patient will complete steroids tapering. -Speaking in full sentences, with good oxygen saturation on chronic supplementation. -For  upper airway congestion will use loratadine and Flonase. -Covid testing negative   Class I obesity -Body mass index is 32.92 kg/m. -Calorie diet, portion control and increase physical activity discussed with patient.   Acute anemia -stable hemoglobin at discharge. -Iron studies with significant deficiency; status post IV Feraheme -No bright red blood per rectum or melanotic stools appreciated. -Patient discharge will need Ferrex daily -Outpatient follow-up with gastroenterology service for further endoscopy/colonoscopy evaluation. -Hemoglobin on repeat CBC has remained stable -Continue PPI.   Diastolic CHF -Last echocardiogram 03/2018 EF 60-65% with indeterminate diastolic function -Continue home Lasix as scheduled -Chronic and compensated -Continue to check daily weights and follow low-sodium diet.   Hypertension -stable -Continue home losartan and metoprolol -Advised to follow heart healthy diet   Depression -Continue Cymbalta -No suicidal ideation or hallucinations.   Diabetes mellitus with neuropathy -Resume home hypoglycemic regimen and closely follow CBGs and A1c as an outpatient. -Resume gabapentin for diabetic neuropathy -SPECT increased CBGs in the setting of his steroids usage.           History of Present Illness  03/09/2020  Pulmonary/ 1st office eval/[name]   J& J   02/23/2020  Chief Complaint  Patient presents with   Pulmonary Consult    Former pt of Dr Luan Pulling. Pt states had pleural effusion March 2020. He has been on o2 24/7 x 2 years- states he has "sinus issues".    Dyspnea:  Mostly house bound but constantly moving around the house on 02  Cough: rattle /min slt yellow/ better now / has flutter valve/ mucinex helps Sleep: cpap per  neurology  SABA use: not using  02  2lpm 24/7 does not titrate rec Kartagener's syndrome causes bronchiecatasis which is your maint problem, not copd Symicort 160 up to every 12 hours as needed if cough/ wheeze/ short  of breath.  Work on inhaler technique:  Only use your albuterol as a rescue medication For cough/ congestion > mucinex /mucinex dm  Up to 1200 mg every 12 hours and use the flutter valve as much as you can Make sure you check your oxygen saturations at highest level of activity to be sure it stays over 90% and adjust upward to maintain this level if needed but remember to turn it back to previous settings when you stop (to conserve your supply).  Please schedule a follow up visit in 3 months but call sooner if needed  with all medications /inhalers/ solutions in hand so we can verify exactly what you are taking. 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 Spirometry 03/09/2020  FEV1 3.08 (54%)  Ratio 0.75  And ERV 17% with nl f/v curve / did not bring  Chief Complaint  Patient presents with   Follow-up    Shortness of breath with activity, productive cough with light green phlegm, having issues with O2 dropping when walking  Dyspnea: room to room slt green  Worse x one month  Cough: very hoarse/ congested  Sleeping: recliner at 45 degrees  SABA use: not able to use  / did not bring meds as req  02: 3lpm  Covid status: J/J 02/23/20 rec Plan A = Automatic = Always=    Symbicort 160 Take 2 puffs first thing in am and then another 2 puffs about 12 hours later.  Work on inhaler technique:  Plan B = Backup (to supplement plan A, not to replace it) Only use your albuterol inhaler as a rescue medication Plan C = Crisis (instead of Plan B but only if Plan B stops working) - only use your albuterol nebulizer if you first try Plan B  Prednisone 10 mg take  4 each am x 2 days,   2 each am x 2 days,  1 each am x 2 days and stop  Augmentin 875 mg take one pill twice daily  X 10 days  For cough > mucinex or mucinex dm up to 1200 mg every 12 hours as needed  and the flutter valve  Make sure you check your oxygen saturation  at your highest level of activity  to be sure it stays over 90% and adjust  02 flow upward to maintain this level if needed but remember to turn it back to previous settings when you stop (to conserve your supply).   You need to see your ENT doctor > Wilburn Cornelia re voice and ear problem > did not do       01/25/2021  f/u ov/[place] office/Sudeep Scheibel re: worse than usual since Feb 2022 with lots of nasal congestion some better p 10 d augmentin Chief Complaint  Patient presents with   Follow-up    Breathing has improved since his last visit. He states still has difficulty with breathing when he gets in a hurry.   Dyspnea:  Room to room even on 02 but not titrating Cough:  Mostly clear now but still very stuffy nose  Sleeping: [place] does sleep / has not seen since worse  sob / reports poor tolerance of cpap  SABA use: neb tid not prn, poor hfa  02: 3-4 lpm  Covid status:  Covid Jan 2021  And J/J April 2022  Rec Augmentin 875 mg take one pill twice daily  X 20 days   Prednisone 10 mg take  4 each am x 2 days,   2 each am x 2 days,  1 each am x 2 days and stop  Make sure you check your oxygen saturation  at your highest level of activity  Work on inhaler technique: Plan A = Automatic = Always=     Symbicort 160 Take 2 puffs first thing in am and then another 2 puffs about 12 hours later.  Plan B = Backup (to supplement plan A, not to replace it) Only use your albuterol inhaler as a rescue medication Plan C = Crisis (instead of Plan B but only if Plan B stops working) - only use your albuterol nebulizer if you first try Plan B and it fails to help > ok to use the nebulizer up to every 4 hours but if start needing it regularly call for immediate appointment Also ok Try albuterol 15 min before an activity (on alternating days)  that you know would make you short of breath and see if it makes any difference and if makes none then don't  take albuterol after activity unless you can't catch your breath as this means it's the resting that helps, not the albuterol.     05/10/2021  f/u ov/[place] office/Charitie Hinote re:  Chief Complaint  Patient presents with   Follow-up    Breathing has been some better. He states had recent respiratory infection and treated with Doxy. He states that he still has occ cough with mainly white sputum. He is using his albuterol inhaler and neb both about 2 x per day.  Dyspnea:  more active than last ov / 4-5 lpm with sats ok  Cough: white mucus esp in am  Sleeping: on back/ 45 degrees  SABA use: mostly p ex  02: 4-5lpm  Covid status: J&J  Lung cancer screening: never    No obvious day to day or daytime variability or assoc excess/ purulent sputum or mucus plugs or hemoptysis or cp or chest tightness, subjective wheeze or overt sinus or hb symptoms.   Sleeping as above  without nocturnal  or early am exacerbation  of respiratory  c/o's or need for noct saba. Also denies any obvious fluctuation of symptoms with weather or environmental changes or other aggravating or alleviating factors except as outlined above   No unusual exposure hx or h/o childhood pna or knowledge of premature birth.  Current Allergies, Complete Past Medical History, Past Surgical History, Family History, and Social History were reviewed in [employer] [facility] record.  ROS  The following are not active complaints unless bolded Hoarseness, sore throat, dysphagia, dental problems, itching, sneezing,  nasal congestion or discharge of excess mucus or purulent secretions, ear ache,   fever, chills, sweats, unintended wt loss or wt gain, classically pleuritic or exertional cp,  orthopnea pnd or arm/hand swelling  or leg swelling, presyncope, palpitations, abdominal pain, anorexia, nausea, vomiting, diarrhea  or change in bowel habits or change in bladder habits, change in stools or change in urine, dysuria, hematuria,  rash,  arthralgias, visual complaints, headache, numbness, weakness or ataxia or problems with walking or coordination,  change in mood or  memory.        Current  Meds  Medication Sig   albuterol (PROVENTIL) (2.5 MG/3ML) 0.083% nebulizer solution Take 3 mLs (2.5 mg total) by nebulization every 4 (four) hours as needed for wheezing or shortness of breath.   albuterol (VENTOLIN HFA) 108 (90 Base) MCG/ACT inhaler INHALE (2) PUFFS INTO THE LUNGS EVERY 4 HOURS AS NEEDED FOR WHEEZING OR SHORTNESS OF BREATH   aspirin EC 81 MG tablet Take 81 mg by mouth daily.   budesonide-formoterol (SYMBICORT) 160-4.5 MCG/ACT inhaler Inhale 2 puffs into the lungs 2 (two) times daily.   calcium-vitamin D (OSCAL WITH D) 500-200 MG-UNIT tablet Take 1 tablet by mouth.   cetirizine (ZYRTEC) 10 MG tablet Take 10 mg by mouth daily.   Cinnamon 500 MG capsule Take 2,000 mg by mouth daily.    co-enzyme Q-10 30 MG capsule Take 30 mg by mouth 3 (three) times daily.   DULoxetine (CYMBALTA) 60 MG capsule Take 60 mg by mouth daily.   fenofibrate (TRICOR) 145 MG tablet Take 145 mg by mouth every evening.    ferrous sulfate 325 (65 FE) MG tablet Take 325-650 mg by mouth See admin instructions. Take two tablets on Monday Wednesday, Friday. And 1 tablet on all other days   fluticasone (FLONASE) 50 MCG/ACT nasal spray Place into the nose.   folic acid (FOLVITE) 1 MG tablet Take 1 mg by mouth daily.   HYDROcodone-acetaminophen (NORCO) 5-325 MG tablet Take 1-2 tablets by mouth 3 (three) times daily as needed.   hydrOXYzine (ATARAX/VISTARIL) 10 MG tablet Take 10 mg by mouth every 8 (eight) hours as needed.   LEVEMIR FLEXTOUCH 100 UNIT/ML FlexPen Inject 20 Units into the skin at bedtime. 5 units in the am and 20 units at bedtime   LINZESS 145 MCG CAPS capsule TAKE ONE CAPSULE BY MOUTH ONCE DAILY BEFORE BREAKFAST.   losartan (COZAAR) 100 MG tablet Take 100 mg by mouth daily.   metFORMIN (GLUCOPHAGE) 1000 MG tablet Take 1,000 mg by mouth 2 (two)  times daily with a meal.   metoprolol tartrate (LOPRESSOR) 25 MG tablet TAKE 1 TABLET TWICE DAILY   Multiple Vitamin (MULTIVITAMIN) tablet Take 1 tablet by mouth daily.   ondansetron (ZOFRAN) 4 MG tablet Take 1-2 tablets (4-8 mg total) by mouth every 8 (eight) hours as needed for nausea or vomiting.   pantoprazole (PROTONIX) 40 MG tablet TAKE 1 TABLET EVERY DAY   pregabalin (LYRICA) 150 MG capsule Take 150 mg by mouth 3 (three) times daily.   rOPINIRole (REQUIP) 2 MG tablet Take 2 mg by mouth at bedtime.    rosuvastatin (CRESTOR) 40 MG tablet Take 40 mg by mouth every evening.    sitaGLIPtin (JANUVIA) 50 MG tablet Take 50 mg by mouth daily.    traMADol (ULTRAM) 50 MG tablet Take 50-100 mg by mouth every 6 (six) hours as needed for moderate pain.    traZODone (DESYREL) 150 MG tablet Take 150 mg by mouth at bedtime.           Past Medical History:  Diagnosis Date   Chronic diastolic heart failure (HCC)    COPD (chronic obstructive pulmonary disease) (HCC)    Depression    Dextrocardia    Diabetes mellitus    GERD (gastroesophageal reflux disease)    H. pylori infection 11/09/2012   treated with pylera   HLD (hyperlipidemia)    HTN (hypertension)    Cholesterol   Iron deficiency anemia, unspecified 10/18/2012   Neuropathy    Pneumonia    Situs inversus totalis  Tachycardia        Objective:      05/10/2021      244  01/25/2021       233  01/01/21 234 lb 12.8 oz (106.5 kg)  11/09/20 215 lb 8 oz (97.8 kg)  09/08/20 219 lb (99.3 kg)     Vital signs reviewed  05/10/2021  - Note at rest 02 sats  99% on 4lpm    General appearance:    extremely hoarse obese amb wm walks with cane  HEENT : pt wearing mask not removed for exam due to covid - 19 concerns.   NECK :  without JVD/Nodes/TM/ nl carotid upstrokes bilaterally   LUNGS: no acc muscle use,  Min barrel  contour chest wall with bilateral  slightly decreased bs s audible wheeze and  without cough on insp or exp maneuvers and  min  Hyperresonant  to  percussion bilaterally     CV:  RRR  no s3 or murmur or increase in P2, and no edema   ABD:  very obese soft and nontender with pos end  insp Hoover's  in the supine position. No bruits or organomegaly appreciated, bowel sounds nl  MS: slow gait/ walks with cane ext warm without deformities, calf tenderness, cyanosis or clubbing No obvious joint restrictions   SKIN: warm and dry without lesions    NEURO:  alert, approp, nl sensorium with  no motor or cerebellar deficits apparent.              Assessment

## 2021-05-10 NOTE — Patient Instructions (Addendum)
We will arrange for you to have humidfied oxygen   Dr Wilburn Cornelia recommended nasal saline during the fluticasone at bedtime and check back with him if not doing better   Plan A = Automatic = Always=     Symbicort 160 Take 2 puffs first thing in am and then another 2 puffs about 12 hours later.   Work on inhaler technique:  relax and gently blow all the way out then take a nice smooth full deep breath back in, triggering the inhaler at same time you start breathing in.  Hold for up to 5 seconds if you can. Blow out thru nose. Rinse and gargle with water when done.  If mouth or throat bother you at all,  try brushing teeth/gums/tongue with arm and hammer toothpaste/ make a slurry and gargle and spit out.    Plan B = Backup (to supplement plan A, not to replace it) Only use your albuterol inhaler as a rescue medication to be used if you can't catch your breath by resting or doing a relaxed purse lip breathing pattern.  - The less you use it, the better it will work when you need it. - Ok to use the inhaler up to 2 puffs  every 4 hours if you must but call for appointment if use goes up over your usual need - Don't leave home without it !!  (think of it like the spare tire for your car)   Plan C = Crisis (instead of Plan B but only if Plan B stops working) - only use your albuterol nebulizer if you first try Plan B and it fails to help > ok to use the nebulizer up to every 4 hours but if start needing it regularly call for immediate appointment   Also ok Try albuterol 15 min before an activity (on alternating days)  that you know would make you short of breath and see if it makes any difference and if makes none then don't take albuterol after activity unless you can't catch your breath as this means it's the resting that helps, not the albuterol.  For nasty mucus >> doxycyline 100 mg twice daily x 7 days   Take singulair 10 mg each evening x one month to what help you get with allergies/sinus  congestion  Make sure you check your oxygen saturation  at your highest level of activity  to be sure it stays over 90% and adjust  02 flow upward to maintain this level if needed but remember to turn it back to previous settings when you stop (to conserve your supply).    Please schedule a follow up visit in 6 months but call sooner if needed

## 2021-05-14 LAB — CBC WITH DIFFERENTIAL/PLATELET
Basophils Absolute: 0.1 10*3/uL (ref 0.0–0.2)
Basos: 0 %
EOS (ABSOLUTE): 0.2 10*3/uL (ref 0.0–0.4)
Eos: 1 %
Hematocrit: 39 % (ref 37.5–51.0)
Hemoglobin: 12.4 g/dL — ABNORMAL LOW (ref 13.0–17.7)
Immature Grans (Abs): 0.3 10*3/uL — ABNORMAL HIGH (ref 0.0–0.1)
Immature Granulocytes: 2 %
Lymphocytes Absolute: 2.3 10*3/uL (ref 0.7–3.1)
Lymphs: 16 %
MCH: 28.4 pg (ref 26.6–33.0)
MCHC: 31.8 g/dL (ref 31.5–35.7)
MCV: 89 fL (ref 79–97)
Monocytes Absolute: 1 10*3/uL — ABNORMAL HIGH (ref 0.1–0.9)
Monocytes: 7 %
Neutrophils Absolute: 10.1 10*3/uL — ABNORMAL HIGH (ref 1.4–7.0)
Neutrophils: 74 %
Platelets: 396 10*3/uL (ref 150–450)
RBC: 4.37 x10E6/uL (ref 4.14–5.80)
RDW: 13.7 % (ref 11.6–15.4)
WBC: 13.8 10*3/uL — ABNORMAL HIGH (ref 3.4–10.8)

## 2021-05-14 LAB — BASIC METABOLIC PANEL
BUN/Creatinine Ratio: 27 — ABNORMAL HIGH (ref 10–24)
BUN: 28 mg/dL — ABNORMAL HIGH (ref 8–27)
CO2: 27 mmol/L (ref 20–29)
Calcium: 9.9 mg/dL (ref 8.6–10.2)
Chloride: 97 mmol/L (ref 96–106)
Creatinine, Ser: 1.04 mg/dL (ref 0.76–1.27)
Glucose: 106 mg/dL — ABNORMAL HIGH (ref 65–99)
Potassium: 4.7 mmol/L (ref 3.5–5.2)
Sodium: 139 mmol/L (ref 134–144)
eGFR: 78 mL/min/{1.73_m2} (ref >59)

## 2021-05-14 LAB — IGE: IgE (Immunoglobulin E), Serum: 12 IU/mL (ref 6–495)

## 2021-05-18 ENCOUNTER — Encounter: Payer: Self-pay | Admitting: *Deleted

## 2021-05-28 ENCOUNTER — Encounter: Payer: Self-pay | Admitting: Student

## 2021-05-28 ENCOUNTER — Ambulatory Visit: Payer: Medicare HMO | Admitting: Student

## 2021-05-28 ENCOUNTER — Other Ambulatory Visit: Payer: Self-pay

## 2021-05-28 VITALS — BP 130/58 | HR 66 | Ht 67.0 in | Wt 246.4 lb

## 2021-05-28 DIAGNOSIS — I10 Essential (primary) hypertension: Secondary | ICD-10-CM | POA: Diagnosis not present

## 2021-05-28 DIAGNOSIS — I5032 Chronic diastolic (congestive) heart failure: Secondary | ICD-10-CM | POA: Diagnosis not present

## 2021-05-28 DIAGNOSIS — I2510 Atherosclerotic heart disease of native coronary artery without angina pectoris: Secondary | ICD-10-CM

## 2021-05-28 DIAGNOSIS — J984 Other disorders of lung: Secondary | ICD-10-CM

## 2021-05-28 DIAGNOSIS — E785 Hyperlipidemia, unspecified: Secondary | ICD-10-CM

## 2021-05-28 MED ORDER — TORSEMIDE 20 MG PO TABS: 40.0000 mg | ORAL_TABLET | Freq: Two times a day (BID) | ORAL | 3 refills | Status: AC

## 2021-05-28 NOTE — Progress Notes (Signed)
Cardiology Office Note    Date:  05/28/2021   ID:  BETO FOUGEROUSSE, DOB [birth year], MRN [id number]  PCP:  [name], MD  Cardiologist: Carlyle Dolly, MD    Chief Complaint  Patient presents with   Follow-up    2 month visit    History of Present Illness:    Brett [name] is a 70 y.o. 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  He was last examined by myself in 03/2021 and reported that his dyspnea had improved since his prior visit but he had experienced a 15 pound weight gain within the past 2 months and reported consuming a significant amount of fluids including 5+ bottles of water a day along with 5+ bottles of soda. He was encouraged to reduce his fluid intake and was continued on Torsemide 40 mg twice daily with plans to titrate to 60 mg twice daily if his fluid status did not improve with dietary changes.   In talking with the patient and his wife today, he reports overall feeling well since his last office visit. He reports his breathing has been stable and denies any specific orthopnea, PND or pitting edema. His weight has been between 244 -245 lbs on his home scales. He denies any recent exertional chest pain or palpitations. Says that his heart rate has been between 60-80 bpm when checked at home.   Past Medical History:  Diagnosis Date   (HFpEF) heart failure with preserved ejection fraction ([place]    a. 03/2018 Echo: EF 60-65%.   CAD (coronary artery disease)    a. 05/2018 Cor CTA: LM nl, LAD moderate stenosis (abnl FFR of 0.76), LCX nl, RCA mixed plaque distally w/o significant stenosis. Ca2+ = 81 (51st %'ile)-->Med rx.   COPD (chronic obstructive pulmonary  disease) (HCC)    Depression    Dextrocardia    Diabetes mellitus    GERD (gastroesophageal reflux disease)    H. pylori infection 11/09/2012   treated with pylera; no H. pylori on EGD in April 2021   HLD (hyperlipidemia)    HTN (hypertension)    Cholesterol   Iron deficiency anemia, unspecified 10/18/2012   Neuropathy    Pneumonia    Situs inversus totalis    Tachycardia     Past Surgical History:  Procedure Laterality Date   BIOPSY  01/09/2020   Procedure: BIOPSY;  Surgeon: RDaneil Dolin MD;  [facility];  Service: Endoscopy;;   CARPAL TUNNEL RELEASE Right 02/12/2020   Procedure: RIGHT CARPAL TUNNEL RELEASE;  Surgeon: XLeandrew Koyanagi MD;  [place] [facility]  Service: Orthopedics;  Laterality: Right;   CARPAL TUNNEL RELEASE Left 04/22/2020   Procedure: LEFT CARPAL TUNNEL RELEASE;  Surgeon: XLeandrew Koyanagi MD;  [facility]  Service: Orthopedics;  Laterality: Left;   CATARACT EXTRACTION, BILATERAL  2016   COLONOSCOPY WITH ESOPHAGOGASTRODUODENOSCOPY (EGD) N/A 11/09/2012   Dr. RGala Romney EGD-normal esophagus, reversed stomach c/w situs inversus (with dextrocardia query kartagener syndrome.) gastric erosions. hpylori on bx- treated with pylera. TCS- normal rectum. 1 diminutive polyp in the mid descending segment. 1-486mpolyp in the mid desending segment o/w the remainder of the colonic mucosa appeared normal. tubular adenoma on bx  COLONOSCOPY WITH PROPOFOL N/A 01/09/2020   Procedure: COLONOSCOPY WITH PROPOFOL;  Surgeon: Daneil Dolin, MD;  Four 3-5 mm polyps in the hepatic flexure and cecum resected and retrieved, otherwise normal exam.  Pathology with tubular adenomas.  Recommended repeat colonoscopy in 3 years.   ESOPHAGOGASTRODUODENOSCOPY (EGD) WITH PROPOFOL N/A 01/09/2020   Procedure: ESOPHAGOGASTRODUODENOSCOPY (EGD) WITH PROPOFOL;  Surgeon: Daneil Dolin, MD; normal exam.  Negative for H. pylori    GIVENS CAPSULE STUDY N/A 10/07/2013    no source for anemia or heme positive stool noted   NECK SURGERY     bone spurs   POLYPECTOMY  01/09/2020   Procedure: POLYPECTOMY;  Surgeon: Daneil Dolin, MD;  [facility];  Service: Endoscopy;;    Current Medications: Outpatient Medications Prior to Visit  Medication Sig Dispense Refill   albuterol (PROVENTIL) (2.5 MG/3ML) 0.083% nebulizer solution Take 3 mLs (2.5 mg total) by nebulization every 4 (four) hours as needed for wheezing or shortness of breath. 75 mL 12   albuterol (VENTOLIN HFA) 108 (90 Base) MCG/ACT inhaler INHALE (2) PUFFS INTO THE LUNGS EVERY 4 HOURS AS NEEDED FOR WHEEZING OR SHORTNESS OF BREATH 8.5 g 10   aspirin EC 81 MG tablet Take 81 mg by mouth daily.     budesonide-formoterol (SYMBICORT) 160-4.5 MCG/ACT inhaler Inhale 2 puffs into the lungs 2 (two) times daily. 1 each 5   calcium-vitamin D (OSCAL WITH D) 500-200 MG-UNIT tablet Take 1 tablet by mouth.     cetirizine (ZYRTEC) 10 MG tablet Take 10 mg by mouth daily.     Cinnamon 500 MG capsule Take 2,000 mg by mouth daily.      co-enzyme Q-10 30 MG capsule Take 30 mg by mouth 3 (three) times daily.     DULoxetine (CYMBALTA) 60 MG capsule Take 60 mg by mouth daily.     fenofibrate (TRICOR) 145 MG tablet Take 145 mg by mouth every evening.      ferrous sulfate 325 (65 FE) MG tablet Take 325-650 mg by mouth See admin instructions. Take two tablets on Monday Wednesday, Friday. And 1 tablet on all other days     fluticasone (FLONASE) 50 MCG/ACT nasal spray Place into the nose.     folic acid (FOLVITE) 1 MG tablet Take 1 mg by mouth daily.     hydrOXYzine (ATARAX/VISTARIL) 10 MG tablet Take 10 mg by mouth every 8 (eight) hours as needed.     LEVEMIR FLEXTOUCH 100 UNIT/ML FlexPen Inject 20 Units into the skin at bedtime. 5 units in the am and 20 units at bedtime     LINZESS 145 MCG CAPS capsule TAKE ONE CAPSULE BY MOUTH ONCE DAILY BEFORE BREAKFAST. 30 capsule 11   losartan (COZAAR) 100 MG tablet Take 100 mg by  mouth daily.     metFORMIN (GLUCOPHAGE) 1000 MG tablet Take 1,000 mg by mouth 2 (two) times daily with a meal.     metoprolol tartrate (LOPRESSOR) 25 MG tablet TAKE 1 TABLET TWICE DAILY 180 tablet 3   montelukast (SINGULAIR) 10 MG tablet One at bedtime every night 30 tablet 2   Multiple Vitamin (MULTIVITAMIN) tablet Take 1 tablet by mouth daily.     ondansetron (ZOFRAN) 4 MG tablet Take 1-2 tablets (4-8 mg total) by mouth every 8 (eight) hours as needed for nausea or vomiting. 20 tablet 0   pantoprazole (PROTONIX) 40 MG tablet TAKE 1 TABLET EVERY DAY 90 tablet 3   pregabalin (LYRICA) 150 MG capsule Take 150 mg  by mouth 3 (three) times daily.     rOPINIRole (REQUIP) 2 MG tablet Take 2 mg by mouth at bedtime.      rosuvastatin (CRESTOR) 40 MG tablet Take 40 mg by mouth every evening.      sitaGLIPtin (JANUVIA) 50 MG tablet Take 50 mg by mouth daily.      traMADol (ULTRAM) 50 MG tablet Take 50-100 mg by mouth every 6 (six) hours as needed for moderate pain.      traZODone (DESYREL) 150 MG tablet Take 150 mg by mouth at bedtime.     torsemide (DEMADEX) 20 MG tablet Take 2 tablets (40 mg total) by mouth 2 (two) times daily. 120 tablet 11   doxycycline (VIBRA-TABS) 100 MG tablet Take 1 tablet (100 mg total) by mouth 2 (two) times daily. (Patient not taking: Reported on 05/28/2021) 14 tablet 0   doxycycline (VIBRA-TABS) 100 MG tablet Take 1 tablet (100 mg total) by mouth 2 (two) times daily. (Patient not taking: Reported on 05/28/2021) 14 tablet 11   HYDROcodone-acetaminophen (NORCO) 5-325 MG tablet Take 1-2 tablets by mouth 3 (three) times daily as needed. (Patient not taking: Reported on 05/28/2021) 30 tablet 0   No facility-administered medications prior to visit.     Allergies:   Patient has no known allergies.   Social History   Socioeconomic History   Marital status: Married    Spouse name: Not on file   Number of children: 0   Years of education: 7th grade    Highest education level: Not  on file  Occupational History   Occupation: landscaping   Tobacco Use   Smoking status: Never   Smokeless tobacco: Current    Types: Chew  Vaping Use   Vaping Use: Never used  Substance and Sexual Activity   Alcohol use: No   Drug use: No   Sexual activity: Yes  Other Topics Concern   Not on file  Social History Narrative   Not on file   Social Determinants of Health   Financial Resource Strain: Low Risk    Difficulty of Paying Living Expenses: Not hard at all  Food Insecurity: No Food Insecurity   Worried About Charity fundraiser in the Last Year: Never true   [place] in the Last Year: Never true  Transportation Needs: No Transportation Needs   Lack of Transportation (Medical): No   Lack of Transportation (Non-Medical): No  Physical Activity: Sufficiently Active   Days of Exercise per Week: 5 days   Minutes of Exercise per Session: 30 min  Stress: No Stress Concern Present   Feeling of Stress : Not at all  Social Connections: Socially Isolated   Frequency of Communication with Friends and Family: Once a week   Frequency of Social Gatherings with Friends and Family: Once a week   Attends Religious Services: Never   Marine scientist or Organizations: No   Attends Music therapist: Never   Marital Status: Married     Family History:  The patient's family history includes Arthritis in his sister; Asthma in his sister; Diabetes in his sister; Heart attack in his mother; Heart defect in his sister; Kidney disease in his sister; Pancreatitis in his sister.   Review of Systems:    Please see the history of present illness.     All other systems reviewed and are otherwise negative except as noted above.   Physical Exam:    VS:  BP (!) 130/58  Pulse 66   Ht '5\' 7"'$  (1.702 m)   Wt 246 lb 6.4 oz (111.8 kg)   SpO2 97%   BMI 38.59 kg/m    General: Well developed, well nourished,male appearing in no acute distress. Head: Normocephalic,  atraumatic. Neck: No carotid bruits. JVD not elevated.  Lungs: Respirations regular and unlabored, scattered rhonchi. On supplemental oxygen.  Heart: Regular rate and rhythm. No S3 or S4.  No murmur, no rubs, or gallops appreciated. Dextrocardia.  Abdomen: Appears non-distended. No obvious abdominal masses. Msk:  Strength and tone appear normal for age. No obvious joint deformities or effusions. Extremities: No clubbing or cyanosis. No pitting edema.  Distal pedal pulses are 2+ bilaterally. Neuro: Alert and oriented X 3. Moves all extremities spontaneously. No focal deficits noted. Psych:  Responds to questions appropriately with a normal affect. Skin: No rashes or lesions noted  Wt Readings from Last 3 Encounters:  05/28/21 246 lb 6.4 oz (111.8 kg)  05/10/21 244 lb (110.7 kg)  03/17/21 239 lb (108.4 kg)     Studies/Labs Reviewed:   EKG:  EKG is not ordered today.    Recent Labs: 09/01/2020: ALT 18 01/29/2021: TSH 0.423 05/10/2021: BUN 28; Creatinine, Ser 1.04; Hemoglobin 12.4; Platelets 396; Potassium 4.7; Sodium 139   Lipid Panel No results found for: CHOL, TRIG, HDL, CHOLHDL, VLDL, LDLCALC, LDLDIRECT  Additional studies/ records that were reviewed today include:   Echocardiogram: 01/2021 IMPRESSIONS     1. Dextrocardia. Left ventricular ejection fraction, by estimation, is 60  to 65%. The left ventricle has normal function. The left ventricle has no  regional wall motion abnormalities. Left ventricular diastolic parameters  are indeterminate.   2. Right ventricular systolic function is normal. The right ventricular  size is normal.   3. Left atrial size was mild to moderately dilated.   4. The pericardial effusion is posterior to the left ventricle and  lateral to the left ventricle.   5. The mitral valve is degenerative. No evidence of mitral valve  regurgitation. No evidence of mitral stenosis. Severe mitral annular  calcification.   6. The aortic valve is tricuspid.  Aortic valve regurgitation is not  visualized. Mild to moderate aortic valve sclerosis/calcification is  present, without any evidence of aortic stenosis.   7. The inferior vena cava is normal in size with greater than 50%  respiratory variability, suggesting right atrial pressure of 3 mmHg.   Assessment:    1. Chronic diastolic CHF (congestive heart failure) ([place])   2. Coronary artery disease involving native coronary artery of native heart without angina pectoris   3. Restrictive lung disease   4. Essential hypertension   5. Hyperlipidemia LDL goal <70      Plan:   In order of problems listed above:  1. HFpEF - Recent echocardiogram earlier this year showed a preserved EF of 60 to 65%. His volume status has improved since prior examinations and he reports his weight has overall been stable on his home scales. His renal function was stable by recent labs in 04/2021 with creatinine at 1.04. Will continue his current diuretic regimen with Torsemide 40 mg twice daily. We did review that he could take an extra tablet if needed for acute weight gain or edema. We also reviewed the importance of sodium and fluid restriction.  2. CAD  - He is s/p Coronary CT in 05/2018 showing moderate stenosis along the LAD with abnormal FFR but medical management recommended given no recurrent symptoms.  - He has baseline  dyspnea on exertion in the setting of known lung disease but denies any exertional chest pain. - Continue current medication regimen with ASA 81 mg daily, Lopressor 25 mg twice daily and Crestor 40 mg daily.  3. Restrictive Lung Disease - He has known Kartagener's Syndrome with bronchiectasis. Remains on continuous supplemental oxygen and he is followed by Pulmonology.  4. HTN - His blood pressure is well controlled at 130/58 during today's visit.  Continue current medication regimen with Losartan 100 mg daily and Lopressor 25 mg twice daily.  5. HLD - Will request most recent labs  from his PCP. He remains on Crestor 40 mg daily with goal LDL less than 70 in the setting of known CAD   Medication Adjustments/Labs and Tests Ordered: Current medicines are reviewed at length with the patient today.  Concerns regarding medicines are outlined above.  Medication changes, Labs and Tests ordered today are listed in the Patient Instructions below. Patient Instructions  Medication Instructions:   Continue current regimen.   Labwork:  None  Testing/Procedures:  None  Follow-Up:  With [place], PA-C or Dr. Harl Bowie in 6 months.   Any Other Special Instructions Will Be Listed Below (If Applicable).  Limit daily fluid intake to less than 2 Liters per day. Please limit salt intake.  Please weight yourself every morning. Take an extra Torsemide tablet if weight increases by 3 pounds overnight or 5 pounds in a single week.  If you need a refill on your cardiac medications before your next appointment, please call your pharmacy.      Signed, Erma Heritage, PA-C  05/28/2021 5:30 PM    [address] [place], [place] [address] Phone: [phone] Fax: [phone]

## 2021-05-28 NOTE — Patient Instructions (Addendum)
Medication Instructions:   Continue current regimen.   Labwork:  None  Testing/Procedures:  None  Follow-Up:  With [place], PA-C or Dr. Harl Bowie in 6 months.   Any Other Special Instructions Will Be Listed Below (If Applicable).  Limit daily fluid intake to less than 2 Liters per day. Please limit salt intake.  Please weight yourself every morning. Take an extra Torsemide tablet if weight increases by 3 pounds overnight or 5 pounds in a single week.  If you need a refill on your cardiac medications before your next appointment, please call your pharmacy.

## 2021-06-10 ENCOUNTER — Other Ambulatory Visit (HOSPITAL_COMMUNITY): Payer: Self-pay | Admitting: Neurology

## 2021-06-10 DIAGNOSIS — M545 Low back pain, unspecified: Secondary | ICD-10-CM

## 2021-06-23 ENCOUNTER — Ambulatory Visit (HOSPITAL_COMMUNITY): Payer: Medicare HMO | Admitting: Physical Therapy

## 2021-07-01 ENCOUNTER — Inpatient Hospital Stay (HOSPITAL_COMMUNITY): Payer: Medicare HMO | Attending: Hematology

## 2021-07-01 ENCOUNTER — Other Ambulatory Visit: Payer: Self-pay

## 2021-07-01 DIAGNOSIS — I5032 Chronic diastolic (congestive) heart failure: Secondary | ICD-10-CM | POA: Insufficient documentation

## 2021-07-01 DIAGNOSIS — D509 Iron deficiency anemia, unspecified: Secondary | ICD-10-CM | POA: Insufficient documentation

## 2021-07-01 DIAGNOSIS — Z7982 Long term (current) use of aspirin: Secondary | ICD-10-CM | POA: Diagnosis not present

## 2021-07-01 DIAGNOSIS — D75839 Thrombocytosis, unspecified: Secondary | ICD-10-CM | POA: Diagnosis not present

## 2021-07-01 DIAGNOSIS — I110 Hypertensive heart disease with heart failure: Secondary | ICD-10-CM | POA: Diagnosis not present

## 2021-07-01 DIAGNOSIS — Z794 Long term (current) use of insulin: Secondary | ICD-10-CM | POA: Diagnosis not present

## 2021-07-01 DIAGNOSIS — I2510 Atherosclerotic heart disease of native coronary artery without angina pectoris: Secondary | ICD-10-CM | POA: Insufficient documentation

## 2021-07-01 DIAGNOSIS — R531 Weakness: Secondary | ICD-10-CM | POA: Diagnosis not present

## 2021-07-01 DIAGNOSIS — E785 Hyperlipidemia, unspecified: Secondary | ICD-10-CM | POA: Diagnosis not present

## 2021-07-01 DIAGNOSIS — J449 Chronic obstructive pulmonary disease, unspecified: Secondary | ICD-10-CM | POA: Insufficient documentation

## 2021-07-01 DIAGNOSIS — E1140 Type 2 diabetes mellitus with diabetic neuropathy, unspecified: Secondary | ICD-10-CM | POA: Insufficient documentation

## 2021-07-01 DIAGNOSIS — K219 Gastro-esophageal reflux disease without esophagitis: Secondary | ICD-10-CM | POA: Diagnosis not present

## 2021-07-01 DIAGNOSIS — R5383 Other fatigue: Secondary | ICD-10-CM | POA: Diagnosis not present

## 2021-07-01 LAB — CBC WITH DIFFERENTIAL/PLATELET
Abs Immature Granulocytes: 0.08 10*3/uL — ABNORMAL HIGH (ref 0.00–0.07)
Basophils Absolute: 0 10*3/uL (ref 0.0–0.1)
Basophils Relative: 0 %
Eosinophils Absolute: 0.1 10*3/uL (ref 0.0–0.5)
Eosinophils Relative: 1 %
HCT: 33.6 % — ABNORMAL LOW (ref 39.0–52.0)
Hemoglobin: 10.3 g/dL — ABNORMAL LOW (ref 13.0–17.0)
Immature Granulocytes: 1 %
Lymphocytes Relative: 14 %
Lymphs Abs: 1.5 10*3/uL (ref 0.7–4.0)
MCH: 29.7 pg (ref 26.0–34.0)
MCHC: 30.7 g/dL (ref 30.0–36.0)
MCV: 96.8 fL (ref 80.0–100.0)
Monocytes Absolute: 0.9 10*3/uL (ref 0.1–1.0)
Monocytes Relative: 8 %
Neutro Abs: 8.4 10*3/uL — ABNORMAL HIGH (ref 1.7–7.7)
Neutrophils Relative %: 76 %
Platelets: 354 10*3/uL (ref 150–400)
RBC: 3.47 MIL/uL — ABNORMAL LOW (ref 4.22–5.81)
RDW: 14.6 % (ref 11.5–15.5)
WBC: 11 10*3/uL — ABNORMAL HIGH (ref 4.0–10.5)
nRBC: 0 % (ref 0.0–0.2)

## 2021-07-01 LAB — IRON AND TIBC
Iron: 82 ug/dL (ref 45–182)
Saturation Ratios: 17 % — ABNORMAL LOW (ref 17.9–39.5)
TIBC: 483 ug/dL — ABNORMAL HIGH (ref 250–450)
UIBC: 401 ug/dL

## 2021-07-01 LAB — FERRITIN: Ferritin: 718 ng/mL — ABNORMAL HIGH (ref 24–336)

## 2021-07-02 ENCOUNTER — Other Ambulatory Visit: Payer: Self-pay | Admitting: Internal Medicine

## 2021-07-07 NOTE — Progress Notes (Signed)
[place], [place] [address]   CLINIC:  Medical Oncology/Hematology  PCP:  [name], [address] [place] [address] [phone]   REASON FOR VISIT:  Follow-up for iron deficiency anemia  CURRENT THERAPY: IV iron as needed  INTERVAL HISTORY:  Brett [name] 70 y.o. male returns for routine follow-up of iron deficiency anemia.  He was last evaluated via telemedicine visit by Tarri Abernethy PA-C on 03/09/2021.  At today's visit, he reports feeling fair and at baseline.  No recent hospitalizations, surgeries, or changes in baseline health status.  His chief complaint is generalized weakness and fatigue.  He also reports that he feels that his dyspnea on exertion and shortness of breath has been progressive in the setting of CHF, restrictive lung disease, and COPD.  He is currently on 4 L of oxygen supplementation.  He denies any symptoms of overt bleeding; no gross rectal hemorrhage, melena, hematemesis, epistaxis, or hematuria.  He admits to some musculoskeletal chest pain when he tries to lift something heavy, but he denies any exertional or anginal type chest pain.  No palpitations or syncopal episodes.  No B symptoms such as fever, chills, night sweats, unintentional weight loss.  He has 40% energy and 100% appetite. He endorses that he is maintaining a stable weight.   REVIEW OF SYSTEMS:  Review of Systems  Constitutional:  Positive for fatigue. Negative for appetite change, chills, diaphoresis, fever and unexpected weight change.  HENT:   Negative for lump/mass and nosebleeds.   Eyes:  Negative for eye problems.  Respiratory:  Positive for cough (Chronic) and shortness of breath. Negative for hemoptysis.   Cardiovascular:  Negative for chest pain, leg swelling and palpitations.  Gastrointestinal:  Negative for abdominal pain, blood in stool, constipation, diarrhea, nausea and vomiting.  Genitourinary:  Negative for  hematuria.   Skin: Negative.   Neurological:  Positive for numbness (Chronic neuropathy in bilateral legs). Negative for dizziness, headaches and light-headedness.  Hematological:  Does not bruise/bleed easily.     PAST MEDICAL/SURGICAL HISTORY:  Past Medical History:  Diagnosis Date   (HFpEF) heart failure with preserved ejection fraction ([facility])    a. 03/2018 Echo: EF 60-65%.   CAD (coronary artery disease)    a. 05/2018 Cor CTA: LM nl, LAD moderate stenosis (abnl FFR of 0.76), LCX nl, RCA mixed plaque distally w/o significant stenosis. Ca2+ = 81 (51st %'ile)-->Med rx.   COPD (chronic obstructive pulmonary disease) (HCC)    Depression    Dextrocardia    Diabetes mellitus    GERD (gastroesophageal reflux disease)    H. pylori infection 11/09/2012   treated with pylera; no H. pylori on EGD in April 2021   HLD (hyperlipidemia)    HTN (hypertension)    Cholesterol   Iron deficiency anemia, unspecified 10/18/2012   Neuropathy    Pneumonia    Situs inversus totalis    Tachycardia    Past Surgical History:  Procedure Laterality Date   BIOPSY  01/09/2020   Procedure: BIOPSY;  Surgeon: Daneil Dolin, MD;  [facility];  Service: Endoscopy;;   CARPAL TUNNEL RELEASE Right 02/12/2020   Procedure: RIGHT CARPAL TUNNEL RELEASE;  Surgeon: Leandrew Koyanagi, MD;  Location: [facility];  Service: Orthopedics;  Laterality: Right;   CARPAL TUNNEL RELEASE Left 04/22/2020   Procedure: LEFT CARPAL TUNNEL RELEASE;  Surgeon: Leandrew Koyanagi, MD;  Location: [facility];  Service: Orthopedics;  Laterality:  Left;   CATARACT EXTRACTION, BILATERAL  2016   COLONOSCOPY WITH ESOPHAGOGASTRODUODENOSCOPY (EGD) N/A 11/09/2012   Dr. 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 hpylori on bx- treated with pylera. TCS- normal rectum. 1 diminutive polyp in the mid descending segment. 1-50mm polyp in the mid  desending segment o/w the remainder of the colonic mucosa appeared normal. tubular adenoma on bx   COLONOSCOPY WITH PROPOFOL N/A 01/09/2020   Procedure: COLONOSCOPY WITH PROPOFOL;  Surgeon: Daneil Dolin, MD;  Four 3-5 mm polyps in the hepatic flexure and cecum resected and retrieved, otherwise normal exam.  Pathology with tubular adenomas.  Recommended repeat colonoscopy in 3 years.   ESOPHAGOGASTRODUODENOSCOPY (EGD) WITH PROPOFOL N/A 01/09/2020   Procedure: ESOPHAGOGASTRODUODENOSCOPY (EGD) WITH PROPOFOL;  Surgeon: Daneil Dolin, MD; normal exam.  Negative for H. pylori    GIVENS CAPSULE STUDY N/A 10/07/2013   no source for anemia or heme positive stool noted   NECK SURGERY     bone spurs   POLYPECTOMY  01/09/2020   Procedure: POLYPECTOMY;  Surgeon: Daneil Dolin, MD;  [facility];  Service: Endoscopy;;     SOCIAL HISTORY:  Social History   Socioeconomic History   Marital status: Married    Spouse name: Not on file   Number of children: 0   Years of education: 7th grade    Highest education level: Not on file  Occupational History   Occupation: landscaping   Tobacco Use   Smoking status: Never   Smokeless tobacco: Current    Types: Chew  Vaping Use   Vaping Use: Never used  Substance and Sexual Activity   Alcohol use: No   Drug use: No   Sexual activity: Yes  Other Topics Concern   Not on file  Social History Narrative   Not on file   Social Determinants of Health   Financial Resource Strain: Low Risk    Difficulty of Paying Living Expenses: Not hard at all  Food Insecurity: No Food Insecurity   Worried About Charity fundraiser in the Last Year: Never true   [place] in the Last Year: Never true  Transportation Needs: No Transportation Needs   Lack of Transportation (Medical): No   Lack of Transportation (Non-Medical): No  Physical Activity: Sufficiently Active   Days of Exercise per Week: 5 days   Minutes of Exercise per Session: 30 min   Stress: No Stress Concern Present   Feeling of Stress : Not at all  Social Connections: Socially Isolated   Frequency of Communication with Friends and Family: Once a week   Frequency of Social Gatherings with Friends and Family: Once a week   Attends Religious Services: Never   Marine scientist or Organizations: No   Attends Music therapist: Never   Marital Status: Married  Human resources officer Violence: Not At Risk   Fear of Current or Ex-Partner: No   Emotionally Abused: No   Physically Abused: No   Sexually Abused: No    FAMILY HISTORY:  Family History  Problem Relation Age of Onset   Heart attack Mother    Diabetes Sister        Fx   Heart defect Sister        Fx   Arthritis Sister        Fx   Asthma Sister        Fx   Kidney disease Sister  Fx   Pancreatitis Sister    Colon cancer Neg Hx     CURRENT MEDICATIONS:  Outpatient Encounter Medications as of 07/08/2021  Medication Sig   albuterol (PROVENTIL) (2.5 MG/3ML) 0.083% nebulizer solution Take 3 mLs (2.5 mg total) by nebulization every 4 (four) hours as needed for wheezing or shortness of breath.   albuterol (VENTOLIN HFA) 108 (90 Base) MCG/ACT inhaler INHALE (2) PUFFS INTO THE LUNGS EVERY 4 HOURS AS NEEDED FOR WHEEZING OR SHORTNESS OF BREATH   aspirin EC 81 MG tablet Take 81 mg by mouth daily.   calcium-vitamin D (OSCAL WITH D) 500-200 MG-UNIT tablet Take 1 tablet by mouth.   cetirizine (ZYRTEC) 10 MG tablet Take 10 mg by mouth daily.   Cinnamon 500 MG capsule Take 2,000 mg by mouth daily.    co-enzyme Q-10 30 MG capsule Take 30 mg by mouth 3 (three) times daily.   doxycycline (VIBRA-TABS) 100 MG tablet Take 1 tablet (100 mg total) by mouth 2 (two) times daily. (Patient not taking: Reported on 05/28/2021)   doxycycline (VIBRA-TABS) 100 MG tablet Take 1 tablet (100 mg total) by mouth 2 (two) times daily. (Patient not taking: Reported on 05/28/2021)   DULoxetine (CYMBALTA) 60 MG capsule Take  60 mg by mouth daily.   fenofibrate (TRICOR) 145 MG tablet Take 145 mg by mouth every evening.    ferrous sulfate 325 (65 FE) MG tablet Take 325-650 mg by mouth See admin instructions. Take two tablets on Monday Wednesday, Friday. And 1 tablet on all other days   fluticasone (FLONASE) 50 MCG/ACT nasal spray Place into the nose.   folic acid (FOLVITE) 1 MG tablet Take 1 mg by mouth daily.   HYDROcodone-acetaminophen (NORCO) 5-325 MG tablet Take 1-2 tablets by mouth 3 (three) times daily as needed. (Patient not taking: Reported on 05/28/2021)   hydrOXYzine (ATARAX/VISTARIL) 10 MG tablet Take 10 mg by mouth every 8 (eight) hours as needed.   LEVEMIR FLEXTOUCH 100 UNIT/ML FlexPen Inject 20 Units into the skin at bedtime. 5 units in the am and 20 units at bedtime   LINZESS 145 MCG CAPS capsule TAKE ONE CAPSULE BY MOUTH ONCE DAILY BEFORE BREAKFAST.   losartan (COZAAR) 100 MG tablet Take 100 mg by mouth daily.   metFORMIN (GLUCOPHAGE) 1000 MG tablet Take 1,000 mg by mouth 2 (two) times daily with a meal.   metoprolol tartrate (LOPRESSOR) 25 MG tablet TAKE 1 TABLET TWICE DAILY   montelukast (SINGULAIR) 10 MG tablet One at bedtime every night   Multiple Vitamin (MULTIVITAMIN) tablet Take 1 tablet by mouth daily.   ondansetron (ZOFRAN) 4 MG tablet Take 1-2 tablets (4-8 mg total) by mouth every 8 (eight) hours as needed for nausea or vomiting.   pantoprazole (PROTONIX) 40 MG tablet TAKE 1 TABLET EVERY DAY   pregabalin (LYRICA) 150 MG capsule Take 150 mg by mouth 3 (three) times daily.   rOPINIRole (REQUIP) 2 MG tablet Take 2 mg by mouth at bedtime.    rosuvastatin (CRESTOR) 40 MG tablet Take 40 mg by mouth every evening.    sitaGLIPtin (JANUVIA) 50 MG tablet Take 50 mg by mouth daily.    SYMBICORT 160-4.5 MCG/ACT inhaler INHALE (2) PUFFS INTO THE LUNGS TWICE DAILY   torsemide (DEMADEX) 20 MG tablet Take 2 tablets (40 mg total) by mouth 2 (two) times daily. May take an extra tablet for fluid or weight gain.    traMADol (ULTRAM) 50 MG tablet Take 50-100 mg by mouth every 6 (six) hours as  needed for moderate pain.    traZODone (DESYREL) 150 MG tablet Take 150 mg by mouth at bedtime.   No facility-administered encounter medications on file as of 07/08/2021.    ALLERGIES:  No Known Allergies   PHYSICAL EXAM:  ECOG PERFORMANCE STATUS: 2 - Symptomatic, <50% confined to bed  There were no vitals filed for this visit. There were no vitals filed for this visit. Physical Exam Constitutional:      Appearance: Normal appearance. He is obese.     Comments: Nasal cannula in place  HENT:     Head: Normocephalic and atraumatic.     Mouth/Throat:     Mouth: Mucous membranes are moist.  Eyes:     Extraocular Movements: Extraocular movements intact.     Pupils: Pupils are equal, round, and reactive to light.  Cardiovascular:     Rate and Rhythm: Normal rate and regular rhythm.     Pulses: Normal pulses.     Heart sounds: Normal heart sounds.  Pulmonary:     Effort: Pulmonary effort is normal.     Comments: Poor air movement decreased lung sounds in posterior lung fields Abdominal:     General: Bowel sounds are normal.     Palpations: Abdomen is soft.     Tenderness: There is no abdominal tenderness.  Musculoskeletal:        General: No swelling.     Right lower leg: Edema (Trace) present.     Left lower leg: Edema (Trace) present.  Lymphadenopathy:     Cervical: No cervical adenopathy.  Skin:    General: Skin is warm and dry.  Neurological:     General: No focal deficit present.     Mental Status: He is alert and oriented to person, place, and time.  Psychiatric:        Mood and Affect: Mood normal.        Behavior: Behavior normal.     LABORATORY DATA:  I have reviewed the labs as listed.  CBC    Component Value Date/Time   WBC 11.0 (H) 07/01/2021 1342   RBC 3.47 (L) 07/01/2021 1342   HGB 10.3 (L) 07/01/2021 1342   HGB 12.4 (L) 05/10/2021 1320   HCT 33.6 (L) 07/01/2021 1342    HCT 39.0 05/10/2021 1320   PLT 354 07/01/2021 1342   PLT 396 05/10/2021 1320   MCV 96.8 07/01/2021 1342   MCV 89 05/10/2021 1320   MCH 29.7 07/01/2021 1342   MCHC 30.7 07/01/2021 1342   RDW 14.6 07/01/2021 1342   RDW 13.7 05/10/2021 1320   LYMPHSABS 1.5 07/01/2021 1342   LYMPHSABS 2.3 05/10/2021 1320   MONOABS 0.9 07/01/2021 1342   EOSABS 0.1 07/01/2021 1342   EOSABS 0.2 05/10/2021 1320   BASOSABS 0.0 07/01/2021 1342   BASOSABS 0.1 05/10/2021 1320   CMP Latest Ref Rng & Units 05/10/2021 01/29/2021 01/22/2021  Glucose 65 - 99 mg/dL 106(H) 179(H) 136(H)  BUN 8 - 27 mg/dL 28(H) 24(H) 22  Creatinine 0.76 - 1.27 mg/dL 1.04 0.86 1.05  Sodium 134 - 144 mmol/L 139 135 133(L)  Potassium 3.5 - 5.2 mmol/L 4.7 4.1 5.0  Chloride 96 - 106 mmol/L 97 93(L) 95(L)  CO2 20 - 29 mmol/L 27 30 30   Calcium 8.6 - 10.2 mg/dL 9.9 10.0 9.4  Total Protein 6.5 - 8.1 g/dL - - -  Total Bilirubin 0.3 - 1.2 mg/dL - - -  Alkaline Phos 38 - 126 U/L - - -  AST 15 -  41 U/L - - -  ALT 0 - 44 U/L - - -    DIAGNOSTIC IMAGING:  I have independently reviewed the relevant imaging and discussed with the patient.  ASSESSMENT & PLAN:  1.  Iron deficiency anemia - Hospitalized in October 2020 when Hgb dropped, received Feraheme while hospitalized on 07/09/2019 - FOBT positive for occult blood in 2014 - Colonoscopy (01/09/2020): Significant for 4 subcentimeter polyps in the hepatic flexure and cecum, rest of the exam was normal - EGD (01/09/2020): Normal esophagus, stomach, duodenum - Takes ferrous sulfate 325 mg at home (2 tablets M/W/F, 1 tablet all other days), but without much improvement in his iron  - Suspect poor iron absorption in the setting of chronic disease, but cannot rule out slow chronic blood loss  - Last IV iron with Venofer x3 from 03/12/2021 through 03/19/2021 - Denies any bleeding per rectum or melena  -Symptomatic with dyspnea on exertion and fatigue - Most recent labs (07/01/2021): Hgb 10.3/MCV 96.8,  TIBC 483, iron saturation 17% with ferritin 718 - Iron studies suggest reactive elevation in ferritin secondary to inflammation and chronic disease, but with functional iron deficiency likely due to elevated TIBC and decreased iron saturation - PLAN:  Given elevated TIBC and low iron saturation, patient would benefit from iron supplementation despite elevated ferritin.  We will proceed with IV iron  supplementation with Venofer  200 mg weekly x 3 doses. - Patient instructed that he can STOP oral iron supplementation. - RTC in 4 months with repeat labs.  2.  Leukocytosis and thrombocytosis - Previous work-up for myeloproliferative disorders was negative - Likely reactive secondary to morbid obesity and multiple other chronic comorbidities and pro-inflammatory states - Most recent labs (07/01/2021): Mild leukocytosis with WBC 11.0 and ANC 8.4; normal platelets 354 - PLAN: No further work-up at this time, but could initiate further work-up if significant deviation from baseline in the future.  3.  Situs inversus totalis - No direct correlation to his anemia although this could be causing a chronic inflammatory state due to primary ciliary dyskinesia    PLAN SUMMARY & DISPOSITION: -IV Venofer 200 mg once per week x3 weeks - Labs and RTC in 3 months  All questions were answered. The patient knows to call the clinic with any problems, questions or concerns.  Medical decision making: Moderate  Time spent on visit: I spent 20 minutes counseling the patient face to face. The total time spent in the appointment was 30 minutes and more than 50% was on counseling.   [name], PA-C  07/08/2021 1:46 PM

## 2021-07-08 ENCOUNTER — Other Ambulatory Visit: Payer: Self-pay

## 2021-07-08 ENCOUNTER — Inpatient Hospital Stay (HOSPITAL_BASED_OUTPATIENT_CLINIC_OR_DEPARTMENT_OTHER): Payer: Medicare HMO | Admitting: Physician Assistant

## 2021-07-08 VITALS — BP 121/50 | HR 68 | Temp 98.2°F | Resp 19 | Wt 250.4 lb

## 2021-07-08 DIAGNOSIS — D509 Iron deficiency anemia, unspecified: Secondary | ICD-10-CM | POA: Diagnosis not present

## 2021-07-08 NOTE — Patient Instructions (Signed)
[facility] at [facility] Discharge Instructions  You were seen today by Tarri Abernethy PA-C for your iron deficiency anemia.  Your blood and iron levels are lower than they should be, which is likely due to your overall chronic disease and chronic stress on your body.  This can affect your body's ability to process iron and make new blood cells.  We treat this by giving you additional IV iron.  LABS: Return in 3 months for repeat labs  OTHER TESTS: No other tests at this time  MEDICATIONS: - You can STOP taking your iron pill at home, since your body is not able to absorb it and put it to good use. - We will treat you with IV iron each week x3 weeks  FOLLOW-UP APPOINTMENT: Office visit in 3 months, after labs   Thank you for choosing [facility] at [facility] to provide your oncology and hematology care.  To afford each patient quality time with our provider, please arrive at least 15 minutes before your scheduled appointment time.   If you have a lab appointment with the [facility] please come in thru the Main Entrance and check in at the main information desk.  You need to re-schedule your appointment should you arrive 10 or more minutes late.  We strive to give you quality time with our providers, and arriving late affects you and other patients whose appointments are after yours.  Also, if you no show three or more times for appointments you may be dismissed from the clinic at the providers discretion.     Again, thank you for choosing [facility].  Our hope is that these requests will decrease the amount of time that you wait before being seen by our physicians.       _____________________________________________________________  Should you have questions after your visit to [facility], please contact our office at [phone] and follow the prompts.  Our office hours are 8:00 a.m. and 4:30 p.m. Monday -  Friday.  Please note that voicemails left after 4:00 p.m. may not be returned until the following business day.  We are closed weekends and major holidays.  You do have access to a nurse 24-7, just call the main number to the clinic [phone] and do not press any options, hold on the line and a nurse will answer the phone.    For prescription refill requests, have your pharmacy contact our office and allow 72 hours.    Due to Covid, you will need to wear a mask upon entering the hospital. If you do not have a mask, a mask will be given to you at the Main Entrance upon arrival. For doctor visits, patients may have 1 support person age 23 or older with them. For treatment visits, patients can not have anyone with them due to social distancing guidelines and our immunocompromised population.

## 2021-07-13 ENCOUNTER — Inpatient Hospital Stay (HOSPITAL_COMMUNITY): Payer: Medicare HMO | Attending: Hematology

## 2021-07-13 ENCOUNTER — Other Ambulatory Visit: Payer: Self-pay

## 2021-07-13 ENCOUNTER — Encounter (HOSPITAL_COMMUNITY): Payer: Self-pay

## 2021-07-13 ENCOUNTER — Ambulatory Visit (HOSPITAL_COMMUNITY)
Admission: RE | Admit: 2021-07-13 | Discharge: 2021-07-13 | Disposition: A | Discharge status: Home / Self Care | Payer: Medicare HMO | Source: Ambulatory Visit | Attending: Neurology | Admitting: Neurology

## 2021-07-13 VITALS — BP 114/54 | HR 61 | Temp 99.0°F | Resp 18

## 2021-07-13 DIAGNOSIS — Z79899 Other long term (current) drug therapy: Secondary | ICD-10-CM | POA: Insufficient documentation

## 2021-07-13 DIAGNOSIS — D509 Iron deficiency anemia, unspecified: Secondary | ICD-10-CM

## 2021-07-13 DIAGNOSIS — D500 Iron deficiency anemia secondary to blood loss (chronic): Secondary | ICD-10-CM

## 2021-07-13 DIAGNOSIS — M545 Low back pain, unspecified: Secondary | ICD-10-CM | POA: Diagnosis not present

## 2021-07-13 MED ORDER — LORATADINE 10 MG PO TABS
10.0000 mg | ORAL_TABLET | Freq: Once | ORAL | Status: AC
  Administered 2021-07-13: 10 mg via ORAL
  Filled 2021-07-13: qty 1

## 2021-07-13 MED ORDER — SODIUM CHLORIDE 0.9 % IV SOLN
200.0000 mg | Freq: Once | INTRAVENOUS | Status: AC
  Administered 2021-07-13: 200 mg via INTRAVENOUS
  Filled 2021-07-13: qty 200

## 2021-07-13 MED ORDER — SODIUM CHLORIDE 0.9 % IV SOLN: Freq: Once | INTRAVENOUS | Status: AC

## 2021-07-13 MED ORDER — ACETAMINOPHEN 325 MG PO TABS
650.0000 mg | ORAL_TABLET | Freq: Once | ORAL | Status: AC
  Administered 2021-07-13: 650 mg via ORAL
  Filled 2021-07-13: qty 2

## 2021-07-13 NOTE — Progress Notes (Signed)
Tolerated venofer 200 mg infusion without incidence today.  Stable during and after treatment today.  AVS reviewed.  Vital signs stable prior to discharge.  Discharged in stable condition via wheelchair.

## 2021-07-13 NOTE — Patient Instructions (Signed)
[facility]  Discharge Instructions: Thank you for choosing [facility] to provide your oncology and hematology care.  If you have a lab appointment with the Cancer Center, please come in thru the Main Entrance and check in at the main information desk.  Wear comfortable clothing and clothing appropriate for easy access to any Portacath or PICC line.   We strive to give you quality time with your provider. You may need to reschedule your appointment if you arrive late (15 or more minutes).  Arriving late affects you and other patients whose appointments are after yours.  Also, if you miss three or more appointments without notifying the office, you may be dismissed from the clinic at the provider's discretion.      For prescription refill requests, have your pharmacy contact our office and allow 72 hours for refills to be completed.    Today you received the following chemotherapy and/or immunotherapy agents venofer      To help prevent nausea and vomiting after your treatment, we encourage you to take your nausea medication as directed.  BELOW ARE SYMPTOMS THAT SHOULD BE REPORTED IMMEDIATELY: *FEVER GREATER THAN 100.4 F (38 C) OR HIGHER *CHILLS OR SWEATING *NAUSEA AND VOMITING THAT IS NOT CONTROLLED WITH YOUR NAUSEA MEDICATION *UNUSUAL SHORTNESS OF BREATH *UNUSUAL BRUISING OR BLEEDING *URINARY PROBLEMS (pain or burning when urinating, or frequent urination) *BOWEL PROBLEMS (unusual diarrhea, constipation, pain near the anus) TENDERNESS IN MOUTH AND THROAT WITH OR WITHOUT PRESENCE OF ULCERS (sore throat, sores in mouth, or a toothache) UNUSUAL RASH, SWELLING OR PAIN  UNUSUAL VAGINAL DISCHARGE OR ITCHING   Items with * indicate a potential emergency and should be followed up as soon as possible or go to the Emergency Department if any problems should occur.  Please show the CHEMOTHERAPY ALERT CARD or IMMUNOTHERAPY ALERT CARD at check-in to the Emergency  Department and triage nurse.  Should you have questions after your visit or need to cancel or reschedule your appointment, please contact [facility] [phone]  and follow the prompts.  Office hours are 8:00 a.m. to 4:30 p.m. Monday - Friday. Please note that voicemails left after 4:00 p.m. may not be returned until the following business day.  We are closed weekends and major holidays. You have access to a nurse at all times for urgent questions. Please call the main number to the clinic [phone] and follow the prompts.  For any non-urgent questions, you may also contact your provider using MyChart. We now offer e-Visits for anyone 18 and older to request care online for non-urgent symptoms. For details visit [email or web address].   Also download the MyChart app! Go to the app store, search "MyChart", open the app, select , and log in with your MyChart username and password.  Due to Covid, a mask is required upon entering the hospital/clinic. If you do not have a mask, one will be given to you upon arrival. For doctor visits, patients may have 1 support person aged 18 or older with them. For treatment visits, patients cannot have anyone with them due to current Covid guidelines and our immunocompromised population.   Iron Sucrose Injection What is this medication? IRON SUCROSE (EYE ern SOO krose) treats low levels of iron (iron deficiency anemia) in people with kidney disease. Iron is a mineral that plays an important role in making red blood cells, which carry oxygen from your lungs to the rest of your body. This medicine may be used for   other purposes; ask your health care provider or pharmacist if you have questions. COMMON BRAND NAME(S): Venofer What should I tell my care team before I take this medication? They need to know if you have any of these conditions: Anemia not caused by low iron levels Heart disease High levels of iron in the blood Kidney  disease Liver disease An unusual or allergic reaction to iron, other medications, foods, dyes, or preservatives Pregnant or trying to get pregnant Breast-feeding How should I use this medication? This medication is for infusion into a vein. It is given in a hospital or clinic setting. Talk to your care team about the use of this medication in children. While this medication may be prescribed for children as young as 2 years for selected conditions, precautions do apply. Overdosage: If you think you have taken too much of this medicine contact a poison control center or emergency room at once. NOTE: This medicine is only for you. Do not share this medicine with others. What if I miss a dose? It is important not to miss your dose. Call your care team if you are unable to keep an appointment. What may interact with this medication? Do not take this medication with any of the following: Deferoxamine Dimercaprol Other iron products This medication may also interact with the following: Chloramphenicol Deferasirox This list may not describe all possible interactions. Give your health care provider a list of all the medicines, herbs, non-prescription drugs, or dietary supplements you use. Also tell them if you smoke, drink alcohol, or use illegal drugs. Some items may interact with your medicine. What should I watch for while using this medication? Visit your care team regularly. Tell your care team if your symptoms do not start to get better or if they get worse. You may need blood work done while you are taking this medication. You may need to follow a special diet. Talk to your care team. Foods that contain iron include: whole grains/cereals, dried fruits, beans, or peas, leafy green vegetables, and organ meats (liver, kidney). What side effects may I notice from receiving this medication? Side effects that you should report to your care team as soon as possible: Allergic reactions-skin rash,  itching, hives, swelling of the face, lips, tongue, or throat Low blood pressure-dizziness, feeling faint or lightheaded, blurry vision Shortness of breath Side effects that usually do not require medical attention (report to your care team if they continue or are bothersome): Flushing Headache Joint pain Muscle pain Nausea Pain, redness, or irritation at injection site This list may not describe all possible side effects. Call your doctor for medical advice about side effects. You may report side effects to FDA at 1-800-FDA-1088. Where should I keep my medication? This medication is given in a hospital or clinic and will not be stored at home. NOTE: This sheet is a summary. It may not cover all possible information. If you have questions about this medicine, talk to your doctor, pharmacist, or health care provider.  2022 [employer] Standard (2020-11-24 12:52:06)  

## 2021-07-20 ENCOUNTER — Inpatient Hospital Stay (HOSPITAL_COMMUNITY): Payer: Medicare HMO

## 2021-07-27 ENCOUNTER — Ambulatory Visit (HOSPITAL_COMMUNITY): Payer: Medicare HMO

## 2021-08-12 DEATH — deceased

## 2021-10-05 ENCOUNTER — Other Ambulatory Visit (HOSPITAL_COMMUNITY): Payer: Medicare HMO

## 2021-10-12 ENCOUNTER — Ambulatory Visit (HOSPITAL_COMMUNITY): Payer: Medicare HMO | Admitting: Physician Assistant

## 2021-11-24 ENCOUNTER — Ambulatory Visit: Payer: Medicare HMO | Admitting: Student
